# Patient Record
Sex: Male | Born: 1945
Health system: Southern US, Community
[De-identification: ages and names within clinical notes are randomized; demographics above are authoritative.]

## PROBLEM LIST (undated history)

## (undated) DIAGNOSIS — I10 Essential (primary) hypertension: Secondary | ICD-10-CM

## (undated) DIAGNOSIS — E039 Hypothyroidism, unspecified: Secondary | ICD-10-CM

## (undated) DIAGNOSIS — G459 Transient cerebral ischemic attack, unspecified: Secondary | ICD-10-CM

## (undated) DIAGNOSIS — S81009A Unspecified open wound, unspecified knee, initial encounter: Secondary | ICD-10-CM

## (undated) DIAGNOSIS — T4145XA Adverse effect of unspecified anesthetic, initial encounter: Secondary | ICD-10-CM

## (undated) DIAGNOSIS — K219 Gastro-esophageal reflux disease without esophagitis: Secondary | ICD-10-CM

## (undated) DIAGNOSIS — E559 Vitamin D deficiency, unspecified: Secondary | ICD-10-CM

## (undated) DIAGNOSIS — M1710 Unilateral primary osteoarthritis, unspecified knee: Secondary | ICD-10-CM

## (undated) DIAGNOSIS — D649 Anemia, unspecified: Secondary | ICD-10-CM

## (undated) DIAGNOSIS — S81809A Unspecified open wound, unspecified lower leg, initial encounter: Secondary | ICD-10-CM

## (undated) DIAGNOSIS — E785 Hyperlipidemia, unspecified: Secondary | ICD-10-CM

## (undated) DIAGNOSIS — E1165 Type 2 diabetes mellitus with hyperglycemia: Secondary | ICD-10-CM

## (undated) DIAGNOSIS — J309 Allergic rhinitis, unspecified: Secondary | ICD-10-CM

## (undated) DIAGNOSIS — Z86010 Personal history of colonic polyps: Secondary | ICD-10-CM

## (undated) DIAGNOSIS — R0902 Hypoxemia: Secondary | ICD-10-CM

## (undated) DIAGNOSIS — E0590 Thyrotoxicosis, unspecified without thyrotoxic crisis or storm: Secondary | ICD-10-CM

## (undated) DIAGNOSIS — M1711 Unilateral primary osteoarthritis, right knee: Secondary | ICD-10-CM

## (undated) DIAGNOSIS — N400 Enlarged prostate without lower urinary tract symptoms: Secondary | ICD-10-CM

## (undated) DIAGNOSIS — R51 Headache: Secondary | ICD-10-CM

## (undated) DIAGNOSIS — U071 COVID-19: Secondary | ICD-10-CM

## (undated) DIAGNOSIS — J984 Other disorders of lung: Secondary | ICD-10-CM

## (undated) DIAGNOSIS — S91009A Unspecified open wound, unspecified ankle, initial encounter: Secondary | ICD-10-CM

## (undated) DIAGNOSIS — T8859XA Other complications of anesthesia, initial encounter: Secondary | ICD-10-CM

## (undated) DIAGNOSIS — R609 Edema, unspecified: Secondary | ICD-10-CM

## (undated) HISTORY — DX: Unspecified open wound, unspecified knee, initial encounter: S81.009A

## (undated) HISTORY — DX: Thyrotoxicosis, unspecified without thyrotoxic crisis or storm: E05.90

## (undated) HISTORY — DX: Hypoxemia: R09.02

## (undated) HISTORY — DX: Hypothyroidism, unspecified: E03.9

## (undated) HISTORY — DX: Edema, unspecified: R60.9

## (undated) HISTORY — DX: Unspecified open wound, unspecified lower leg, initial encounter: S81.809A

## (undated) HISTORY — DX: Type 2 diabetes mellitus with hyperglycemia: E11.65

## (undated) HISTORY — DX: Other disorders of lung: J98.4

## (undated) HISTORY — PX: NECK SURGERY: SHX720

## (undated) HISTORY — DX: Headache: R51

## (undated) HISTORY — DX: Anemia, unspecified: D64.9

## (undated) HISTORY — DX: Hyperlipidemia, unspecified: E78.5

## (undated) HISTORY — DX: Unilateral primary osteoarthritis, right knee: M17.11

## (undated) HISTORY — DX: COVID-19: U07.1

## (undated) HISTORY — DX: Personal history of colonic polyps: Z86.010

## (undated) HISTORY — DX: Unilateral primary osteoarthritis, unspecified knee: M17.10

## (undated) HISTORY — DX: Gastro-esophageal reflux disease without esophagitis: K21.9

## (undated) HISTORY — DX: Unspecified open wound, unspecified ankle, initial encounter: S91.009A

## (undated) HISTORY — DX: Transient cerebral ischemic attack, unspecified: G45.9

## (undated) HISTORY — DX: Benign prostatic hyperplasia without lower urinary tract symptoms: N40.0

## (undated) HISTORY — DX: Essential (primary) hypertension: I10

## (undated) HISTORY — DX: Allergic rhinitis, unspecified: J30.9

## (undated) HISTORY — DX: Vitamin D deficiency, unspecified: E55.9

---

## 1984-11-26 HISTORY — PX: BACK SURGERY: SHX140

## 1985-07-27 HISTORY — PX: HERNIA REPAIR: SHX51

## 1985-11-26 HISTORY — PX: OTHER SURGICAL HISTORY: SHX169

## 1998-06-14 ENCOUNTER — Ambulatory Visit (HOSPITAL_COMMUNITY): Admission: RE | Admit: 1998-06-14 | Discharge: 1998-06-14 | Payer: Self-pay | Admitting: Orthopedic Surgery

## 1998-11-26 HISTORY — PX: NISSEN FUNDOPLICATION: SHX2091

## 1998-11-26 LAB — HM COLONOSCOPY: HM Colonoscopy: NORMAL

## 1999-01-02 ENCOUNTER — Ambulatory Visit (HOSPITAL_COMMUNITY): Admission: RE | Admit: 1999-01-02 | Discharge: 1999-01-02 | Payer: Self-pay | Admitting: Internal Medicine

## 1999-01-02 ENCOUNTER — Encounter: Payer: Self-pay | Admitting: Internal Medicine

## 1999-02-16 ENCOUNTER — Other Ambulatory Visit: Admission: RE | Admit: 1999-02-16 | Discharge: 1999-02-16 | Payer: Self-pay | Admitting: Internal Medicine

## 1999-03-28 ENCOUNTER — Ambulatory Visit (HOSPITAL_COMMUNITY): Admission: RE | Admit: 1999-03-28 | Discharge: 1999-03-28 | Payer: Self-pay | Admitting: Internal Medicine

## 1999-06-16 ENCOUNTER — Inpatient Hospital Stay (HOSPITAL_COMMUNITY): Admission: EM | Admit: 1999-06-16 | Discharge: 1999-06-19 | Payer: Self-pay | Admitting: Surgery

## 2005-02-14 ENCOUNTER — Ambulatory Visit (HOSPITAL_COMMUNITY): Admission: RE | Admit: 2005-02-14 | Discharge: 2005-02-14 | Payer: Self-pay | Admitting: Orthopedic Surgery

## 2006-06-12 ENCOUNTER — Ambulatory Visit: Payer: Self-pay | Admitting: Internal Medicine

## 2006-07-01 ENCOUNTER — Ambulatory Visit: Payer: Self-pay | Admitting: Internal Medicine

## 2006-07-22 ENCOUNTER — Ambulatory Visit: Payer: Self-pay | Admitting: Internal Medicine

## 2006-07-22 LAB — CONVERTED CEMR LAB: PSA: 0.7 ng/mL

## 2006-07-23 ENCOUNTER — Ambulatory Visit: Payer: Self-pay | Admitting: Internal Medicine

## 2007-01-23 ENCOUNTER — Ambulatory Visit: Payer: Self-pay | Admitting: Internal Medicine

## 2007-01-23 LAB — CONVERTED CEMR LAB: TSH: 5.64 microintl units/mL — ABNORMAL HIGH (ref 0.35–5.50)

## 2007-03-13 ENCOUNTER — Ambulatory Visit: Payer: Self-pay | Admitting: Internal Medicine

## 2007-03-25 ENCOUNTER — Ambulatory Visit: Payer: Self-pay | Admitting: Internal Medicine

## 2007-05-08 ENCOUNTER — Ambulatory Visit: Payer: Self-pay | Admitting: Internal Medicine

## 2007-05-19 ENCOUNTER — Ambulatory Visit: Payer: Self-pay | Admitting: Internal Medicine

## 2007-05-19 LAB — CONVERTED CEMR LAB
BUN: 14 mg/dL (ref 6–23)
Creatinine, Ser: 1 mg/dL (ref 0.4–1.5)

## 2007-05-20 ENCOUNTER — Ambulatory Visit: Payer: Self-pay | Admitting: Cardiology

## 2007-06-11 ENCOUNTER — Ambulatory Visit: Payer: Self-pay | Admitting: Internal Medicine

## 2007-07-11 ENCOUNTER — Encounter: Payer: Self-pay | Admitting: Internal Medicine

## 2007-07-11 DIAGNOSIS — I10 Essential (primary) hypertension: Secondary | ICD-10-CM

## 2007-07-11 DIAGNOSIS — E785 Hyperlipidemia, unspecified: Secondary | ICD-10-CM

## 2007-07-11 DIAGNOSIS — R519 Headache, unspecified: Secondary | ICD-10-CM | POA: Insufficient documentation

## 2007-07-11 DIAGNOSIS — R51 Headache: Secondary | ICD-10-CM

## 2007-07-11 DIAGNOSIS — K219 Gastro-esophageal reflux disease without esophagitis: Secondary | ICD-10-CM

## 2007-07-11 HISTORY — DX: Headache: R51

## 2007-07-11 HISTORY — DX: Hyperlipidemia, unspecified: E78.5

## 2007-07-11 HISTORY — DX: Essential (primary) hypertension: I10

## 2007-07-11 HISTORY — DX: Gastro-esophageal reflux disease without esophagitis: K21.9

## 2007-07-14 DIAGNOSIS — N400 Enlarged prostate without lower urinary tract symptoms: Secondary | ICD-10-CM

## 2007-07-14 DIAGNOSIS — E0590 Thyrotoxicosis, unspecified without thyrotoxic crisis or storm: Secondary | ICD-10-CM

## 2007-07-14 DIAGNOSIS — Z860100 Personal history of colon polyps, unspecified: Secondary | ICD-10-CM

## 2007-07-14 HISTORY — DX: Personal history of colon polyps, unspecified: Z86.0100

## 2007-07-14 HISTORY — DX: Benign prostatic hyperplasia without lower urinary tract symptoms: N40.0

## 2007-07-14 HISTORY — DX: Thyrotoxicosis, unspecified without thyrotoxic crisis or storm: E05.90

## 2007-07-14 HISTORY — DX: Personal history of colonic polyps: Z86.010

## 2007-07-23 ENCOUNTER — Ambulatory Visit: Payer: Self-pay | Admitting: Internal Medicine

## 2007-08-06 ENCOUNTER — Ambulatory Visit: Payer: Self-pay | Admitting: Internal Medicine

## 2007-10-30 ENCOUNTER — Ambulatory Visit: Payer: Self-pay | Admitting: Internal Medicine

## 2007-11-06 ENCOUNTER — Encounter: Payer: Self-pay | Admitting: Internal Medicine

## 2007-11-06 DIAGNOSIS — E039 Hypothyroidism, unspecified: Secondary | ICD-10-CM

## 2007-11-06 DIAGNOSIS — J309 Allergic rhinitis, unspecified: Secondary | ICD-10-CM

## 2007-11-06 HISTORY — DX: Hypothyroidism, unspecified: E03.9

## 2007-11-06 HISTORY — DX: Allergic rhinitis, unspecified: J30.9

## 2007-11-21 ENCOUNTER — Inpatient Hospital Stay (HOSPITAL_COMMUNITY): Admission: RE | Admit: 2007-11-21 | Discharge: 2007-11-24 | Payer: Self-pay | Admitting: Orthopedic Surgery

## 2007-11-21 HISTORY — PX: OTHER SURGICAL HISTORY: SHX169

## 2008-04-22 ENCOUNTER — Telehealth: Payer: Self-pay | Admitting: Internal Medicine

## 2008-05-03 ENCOUNTER — Telehealth (INDEPENDENT_AMBULATORY_CARE_PROVIDER_SITE_OTHER): Payer: Self-pay | Admitting: *Deleted

## 2008-05-07 ENCOUNTER — Ambulatory Visit (HOSPITAL_COMMUNITY): Admission: RE | Admit: 2008-05-07 | Discharge: 2008-05-07 | Payer: Self-pay | Admitting: Orthopedic Surgery

## 2008-06-24 ENCOUNTER — Telehealth (INDEPENDENT_AMBULATORY_CARE_PROVIDER_SITE_OTHER): Payer: Self-pay | Admitting: *Deleted

## 2008-06-24 ENCOUNTER — Ambulatory Visit: Payer: Self-pay | Admitting: Internal Medicine

## 2008-06-24 DIAGNOSIS — J984 Other disorders of lung: Secondary | ICD-10-CM

## 2008-06-24 HISTORY — DX: Other disorders of lung: J98.4

## 2008-08-23 ENCOUNTER — Telehealth: Payer: Self-pay | Admitting: Internal Medicine

## 2008-10-20 ENCOUNTER — Telehealth: Payer: Self-pay | Admitting: Internal Medicine

## 2008-10-27 ENCOUNTER — Ambulatory Visit: Payer: Self-pay | Admitting: Internal Medicine

## 2008-10-27 DIAGNOSIS — IMO0002 Reserved for concepts with insufficient information to code with codable children: Secondary | ICD-10-CM

## 2008-10-27 DIAGNOSIS — M1710 Unilateral primary osteoarthritis, unspecified knee: Secondary | ICD-10-CM

## 2008-10-27 HISTORY — DX: Reserved for concepts with insufficient information to code with codable children: IMO0002

## 2008-10-28 ENCOUNTER — Telehealth: Payer: Self-pay | Admitting: Internal Medicine

## 2008-10-29 ENCOUNTER — Telehealth: Payer: Self-pay | Admitting: Internal Medicine

## 2008-11-25 ENCOUNTER — Telehealth: Payer: Self-pay | Admitting: Internal Medicine

## 2008-11-29 ENCOUNTER — Telehealth: Payer: Self-pay | Admitting: Internal Medicine

## 2009-05-31 ENCOUNTER — Ambulatory Visit: Payer: Self-pay | Admitting: Internal Medicine

## 2009-05-31 DIAGNOSIS — S91009A Unspecified open wound, unspecified ankle, initial encounter: Secondary | ICD-10-CM

## 2009-05-31 DIAGNOSIS — S81009A Unspecified open wound, unspecified knee, initial encounter: Secondary | ICD-10-CM

## 2009-05-31 DIAGNOSIS — S81809A Unspecified open wound, unspecified lower leg, initial encounter: Secondary | ICD-10-CM | POA: Insufficient documentation

## 2009-05-31 DIAGNOSIS — R609 Edema, unspecified: Secondary | ICD-10-CM

## 2009-05-31 HISTORY — DX: Unspecified open wound, unspecified knee, initial encounter: S81.009A

## 2009-05-31 HISTORY — DX: Edema, unspecified: R60.9

## 2009-06-01 ENCOUNTER — Ambulatory Visit: Payer: Self-pay | Admitting: Internal Medicine

## 2009-06-02 ENCOUNTER — Telehealth (INDEPENDENT_AMBULATORY_CARE_PROVIDER_SITE_OTHER): Payer: Self-pay | Admitting: *Deleted

## 2009-06-02 LAB — CONVERTED CEMR LAB
AST: 27 units/L (ref 0–37)
Albumin: 4.1 g/dL (ref 3.5–5.2)
Alkaline Phosphatase: 64 units/L (ref 39–117)
Basophils Relative: 0 % (ref 0.0–3.0)
Calcium: 9 mg/dL (ref 8.4–10.5)
GFR calc non Af Amer: 79.64 mL/min (ref >60)
Hemoglobin, Urine: NEGATIVE
Hemoglobin: 15.4 g/dL (ref 13.0–17.0)
Leukocytes, UA: NEGATIVE
Lymphocytes Relative: 19.1 % (ref 12.0–46.0)
Monocytes Relative: 7.1 % (ref 3.0–12.0)
Neutro Abs: 5.3 10*3/uL (ref 1.4–7.7)
Nitrite: NEGATIVE
PSA: 0.52 ng/mL (ref 0.10–4.00)
RBC: 4.84 M/uL (ref 4.22–5.81)
Sodium: 142 meq/L (ref 135–145)
TSH: 2.65 microintl units/mL (ref 0.35–5.50)
Total Protein: 6.8 g/dL (ref 6.0–8.3)
VLDL: 73.4 mg/dL — ABNORMAL HIGH (ref 0.0–40.0)
WBC: 7.5 10*3/uL (ref 4.5–10.5)
pH: 7 (ref 5.0–8.0)

## 2009-06-13 ENCOUNTER — Ambulatory Visit: Payer: Self-pay | Admitting: Internal Medicine

## 2009-06-13 ENCOUNTER — Telehealth: Payer: Self-pay | Admitting: Internal Medicine

## 2009-06-21 ENCOUNTER — Encounter: Payer: Self-pay | Admitting: Internal Medicine

## 2009-06-21 ENCOUNTER — Ambulatory Visit: Payer: Self-pay | Admitting: Internal Medicine

## 2009-06-26 ENCOUNTER — Encounter: Payer: Self-pay | Admitting: Internal Medicine

## 2009-06-28 ENCOUNTER — Ambulatory Visit: Payer: Self-pay | Admitting: Internal Medicine

## 2009-06-28 LAB — CONVERTED CEMR LAB
AST: 25 units/L (ref 0–37)
Albumin: 4 g/dL (ref 3.5–5.2)
Alkaline Phosphatase: 69 units/L (ref 39–117)
Bilirubin, Direct: 0.2 mg/dL (ref 0.0–0.3)
Cholesterol: 190 mg/dL (ref 0–200)
Total Bilirubin: 0.7 mg/dL (ref 0.3–1.2)
Total CHOL/HDL Ratio: 6

## 2009-11-17 ENCOUNTER — Ambulatory Visit: Payer: Self-pay | Admitting: Internal Medicine

## 2010-07-05 ENCOUNTER — Telehealth: Payer: Self-pay | Admitting: Internal Medicine

## 2010-10-16 ENCOUNTER — Telehealth: Payer: Self-pay | Admitting: Internal Medicine

## 2010-11-13 ENCOUNTER — Telehealth: Payer: Self-pay | Admitting: Internal Medicine

## 2010-11-29 ENCOUNTER — Telehealth: Payer: Self-pay | Admitting: Internal Medicine

## 2010-12-17 ENCOUNTER — Encounter: Payer: Self-pay | Admitting: Internal Medicine

## 2010-12-19 ENCOUNTER — Telehealth: Payer: Self-pay | Admitting: Internal Medicine

## 2010-12-26 NOTE — Progress Notes (Signed)
  Phone Note Refill Request Message from:  Fax from Pharmacy on July 05, 2010 10:46 AM  Refills Requested: Medication #1:  AMLODIPINE BESYLATE 5 MG TABS 1 by mouth once daily   Dosage confirmed as above?Dosage Confirmed   Last Refilled: 10/2009   [facility]: [facility]  Medication #2:  PRAVACHOL 20 MG TABS 1 by mouth once daily.   Dosage confirmed as above?Dosage Confirmed   Last Refilled: 10/2009   Notes: [facility] Initial call taken by: Zella Ball Ewing CMA (AAMA),  July 05, 2010 10:46 AM    Prescriptions: PRAVACHOL 20 MG TABS (PRAVASTATIN SODIUM) 1 by mouth once daily  #90 x 0   Entered by:   Scharlene Gloss CMA (AAMA)   Authorized by:   Corwin Levins MD   Signed by:   Scharlene Gloss CMA (AAMA) on 07/05/2010   Method used:   Faxed to ...       [facility] (retail)       [address]       [place], [place]  [address]       Ph: [phone] or [phone]       Fax: [phone]   RxID:   [id number] AMLODIPINE BESYLATE 5 MG TABS (AMLODIPINE BESYLATE) 1 by mouth once daily  #90 x 0   Entered by:   Scharlene Gloss CMA (AAMA)   Authorized by:   Corwin Levins MD   Signed by:   Scharlene Gloss CMA (AAMA) on 07/05/2010   Method used:   Faxed to ...       [facility] (retail)       [address]       [place], [place]  [address]       Ph: [phone] or [phone]       Fax: [phone]   RxID:   [id number]

## 2010-12-26 NOTE — Progress Notes (Signed)
  Phone Note Refill Request Message from:  Fax from Pharmacy on October 16, 2010 10:21 AM  Refills Requested: Medication #1:  AMLODIPINE BESYLATE 5 MG TABS 1 by mouth once daily   Dosage confirmed as above?Dosage Confirmed   Last Refilled: 07/05/2010   Notes: [facility] [place]  Medication #2:  PRAVACHOL 20 MG TABS 1 by mouth once daily.   Dosage confirmed as above?Dosage Confirmed   Last Refilled: 07/05/2010   Notes: [facility] [place] Initial call taken by: Zella Ball Ewing CMA (AAMA),  October 16, 2010 10:21 AM    Prescriptions: PRAVACHOL 20 MG TABS (PRAVASTATIN SODIUM) 1 by mouth once daily  #30 x 0   Entered by:   Scharlene Gloss CMA (AAMA)   Authorized by:   Corwin Levins MD   Signed by:   Scharlene Gloss CMA (AAMA) on 10/16/2010   Method used:   Faxed to ...       [facility] (retail)       [address]       [place], [place]  [address]       Ph: [phone] or [phone]       Fax: [phone]   RxID:   [id number] AMLODIPINE BESYLATE 5 MG TABS (AMLODIPINE BESYLATE) 1 by mouth once daily  #30 x 0   Entered by:   Scharlene Gloss CMA (AAMA)   Authorized by:   Corwin Levins MD   Signed by:   Scharlene Gloss CMA (AAMA) on 10/16/2010   Method used:   Faxed to ...       [facility] (retail)       [address]       [place], [place]  [address]       Ph: [phone] or [phone]       Fax: [phone]   RxID:   [id number]

## 2010-12-27 ENCOUNTER — Encounter: Payer: Self-pay | Admitting: Internal Medicine

## 2010-12-28 NOTE — Progress Notes (Signed)
  Phone Note Refill Request Message from:  Fax from Pharmacy on November 29, 2010 1:39 PM  Refills Requested: Medication #1:  PRAVACHOL 20 MG TABS 1 by mouth once daily.   Dosage confirmed as above?Dosage Confirmed   Last Refilled: 09/2010   Notes: [facility] Initial call taken by: Zella Ball Ewing CMA (AAMA),  November 29, 2010 1:40 PM    Prescriptions: PRAVACHOL 20 MG TABS (PRAVASTATIN SODIUM) 1 by mouth once daily  #30 x 0   Entered by:   Zella Ball Ewing CMA (AAMA)   Authorized by:   Corwin Levins MD   Signed by:   Scharlene Gloss CMA (AAMA) on 11/29/2010   Method used:   Faxed to ...       [facility] (retail)       [address]       [place], [place]  [address]       Ph: [phone] or [phone]       Fax: [phone]   RxID:   [id number]

## 2010-12-28 NOTE — Progress Notes (Signed)
  Phone Note Refill Request Message from:  Fax from Pharmacy on November 29, 2010 1:32 PM  Refills Requested: Medication #1:  LABETALOL HCL 200 MG TABS 1 tablet by mouth twice a day   Dosage confirmed as above?Dosage Confirmed   Last Refilled: 10/2009   Notes: [facility] Initial call taken by: Robin Ewing CMA (AAMA),  November 29, 2010 1:32 PM    Prescriptions: LABETALOL HCL 200 MG TABS (LABETALOL HCL) 1 tablet by mouth twice a day  #60 x 0   Entered by:   Scharlene Gloss CMA (AAMA)   Authorized by:   Corwin Levins MD   Signed by:   Scharlene Gloss CMA (AAMA) on 11/29/2010   Method used:   Faxed to ...       [facility] (retail)       [address]       [place], [place]  [address]       Ph: [phone] or [phone]       Fax: [phone]   RxID:   [id number]

## 2010-12-28 NOTE — Progress Notes (Signed)
  Phone Note Refill Request Message from:  Fax from Pharmacy on November 29, 2010 1:30 PM  Refills Requested: Medication #1:  AMLODIPINE BESYLATE 5 MG TABS 1 by mouth once daily   Dosage confirmed as above?Dosage Confirmed   Last Refilled: 09/2010   Notes: [facility] Initial call taken by: Robin Ewing CMA (AAMA),  November 29, 2010 1:31 PM    Prescriptions: AMLODIPINE BESYLATE 5 MG TABS (AMLODIPINE BESYLATE) 1 by mouth once daily  #30 x 0   Entered by:   Scharlene Gloss CMA (AAMA)   Authorized by:   Corwin Levins MD   Signed by:   Scharlene Gloss CMA (AAMA) on 11/29/2010   Method used:   Faxed to ...       [facility] (retail)       [address]       [place], [place]  [address]       Ph: [phone] or [phone]       Fax: [phone]   RxID:   [id number]

## 2010-12-28 NOTE — Progress Notes (Signed)
  Phone Note Refill Request Message from:  Fax from Pharmacy on December 19, 2010 2:24 PM  Refills Requested: Medication #1:  LEVOTHYROXINE SODIUM 50 MCG TABS Take 1 tablet by mouth once a day   Dosage confirmed as above?Dosage Confirmed   Notes: [facility] Initial call taken by: Zella Ball Ewing CMA (AAMA),  December 19, 2010 2:24 PM    Prescriptions: LEVOTHYROXINE SODIUM 50 MCG TABS (LEVOTHYROXINE SODIUM) Take 1 tablet by mouth once a day  #30 x 0   Entered by:   Scharlene Gloss CMA (AAMA)   Authorized by:   Corwin Levins MD   Signed by:   Scharlene Gloss CMA (AAMA) on 12/19/2010   Method used:   Faxed to ...       [facility] (retail)       [address]       [place], [place]  [address]       Ph: [phone] or [phone]       Fax: [phone]   RxID:   [id number]

## 2010-12-28 NOTE — Progress Notes (Signed)
  Phone Note Refill Request Message from:  Fax from Pharmacy on November 13, 2010 11:14 AM  Refills Requested: Medication #1:  LEVOTHYROXINE SODIUM 50 MCG TABS Take 1 tablet by mouth once a day   Dosage confirmed as above?Dosage Confirmed   Notes: [facility] Initial call taken by: Zella Ball Ewing CMA (AAMA),  November 13, 2010 11:18 AM    Prescriptions: LEVOTHYROXINE SODIUM 50 MCG TABS (LEVOTHYROXINE SODIUM) Take 1 tablet by mouth once a day  #30 x 0   Entered by:   Scharlene Gloss CMA (AAMA)   Authorized by:   Corwin Levins MD   Signed by:   Scharlene Gloss CMA (AAMA) on 11/13/2010   Method used:   Faxed to ...       [facility] (retail)       [address]       [place], [place]  [address]       Ph: [phone] or [phone]       Fax: [phone]   RxID:   [id number]

## 2011-01-08 ENCOUNTER — Telehealth: Payer: Self-pay | Admitting: Internal Medicine

## 2011-01-17 NOTE — Letter (Signed)
Summary: [facility]   Imported By: Sherian Rein 01/08/2011 12:12:26  _____________________________________________________________________  External Attachment:    Type:   Image     Comment:   External Document

## 2011-01-17 NOTE — Progress Notes (Signed)
  Phone Note Refill Request Message from:  Fax from Pharmacy on January 08, 2011 4:45 PM  Refills Requested: Medication #1:  AMLODIPINE BESYLATE 5 MG TABS 1 by mouth once daily   Dosage confirmed as above?Dosage Confirmed   Last Refilled: 11/29/2010   Notes: [facility]  Medication #2:  LABETALOL HCL 200 MG TABS 1 tablet by mouth twice a day   Dosage confirmed as above?Dosage Confirmed   Last Refilled: 12/09/2010   Notes: [facility]  Medication #3:  PRAVACHOL 20 MG TABS 1 by mouth once daily.   Dosage confirmed as above?Dosage Confirmed   Last Refilled: 12/09/2010   Notes: [facility] Initial call taken by: Robin Ewing CMA (AAMA),  January 08, 2011 4:45 PM    Prescriptions: PRAVACHOL 20 MG TABS (PRAVASTATIN SODIUM) 1 by mouth once daily  #30 x 0   Entered by:   Scharlene Gloss CMA (AAMA)   Authorized by:   Corwin Levins MD   Signed by:   Scharlene Gloss CMA (AAMA) on 01/08/2011   Method used:   Faxed to ...       [facility] (retail)       [address]       [place], [place]  [address]       Ph: [phone] or [phone]       Fax: [phone]   RxID:   [id number] LABETALOL HCL 200 MG TABS (LABETALOL HCL) 1 tablet by mouth twice a day  #60 x 0   Entered by:   Scharlene Gloss CMA (AAMA)   Authorized by:   Corwin Levins MD   Signed by:   Scharlene Gloss CMA (AAMA) on 01/08/2011   Method used:   Faxed to ...       [facility] (retail)       [address]       [place], [place]  [address]       Ph: [phone] or [phone]       Fax: [phone]   RxID:   [id number] AMLODIPINE BESYLATE 5 MG TABS (AMLODIPINE BESYLATE) 1 by mouth once daily  #30 x 0   Entered by:   Scharlene Gloss CMA (AAMA)   Authorized by:   Corwin Levins MD   Signed by:   Scharlene Gloss CMA (AAMA) on 01/08/2011   Method used:   Faxed to ...       [facility] (retail)       [address]       [place], [place]  [address]       Ph: [phone] or [phone]       Fax: [phone]   RxID:   [id number]

## 2011-02-05 ENCOUNTER — Telehealth: Payer: Self-pay | Admitting: Internal Medicine

## 2011-02-11 NOTE — Progress Notes (Unsigned)
  Subjective:    Patient ID: [name], male    DOB: [birth year], 65 y.o.   MRN: [id number]  HPI    Review of Systems     Objective:   Physical Exam        Assessment & Plan:

## 2011-02-11 NOTE — Progress Notes (Deleted)
  Subjective:    Patient ID: [name], male    DOB: 1945-12-29, 65 y.o.   MRN: [id number]  HPI    Review of Systems  Constitutional: Negative for diaphoresis, activity change, appetite change and unexpected weight change.  HENT: Negative for hearing loss, ear pain, facial swelling, mouth sores and neck stiffness.   Eyes: Negative for pain, redness and visual disturbance.  Respiratory: Negative for shortness of breath and wheezing.   Cardiovascular: Negative for chest pain and palpitations.  Gastrointestinal: Negative for diarrhea, blood in stool, abdominal distention and rectal pain.  Genitourinary: Negative for hematuria, flank pain and decreased urine volume.  Musculoskeletal: Negative for myalgias and joint swelling.  Skin: Negative for color change and wound.  Neurological: Negative for syncope and numbness.  Hematological: Negative for adenopathy.  Psychiatric/Behavioral: Negative for hallucinations, self-injury, decreased concentration and agitation.      Objective:   Physical Exam  Vitals reviewed. Constitutional: He is oriented to person, place, and time. He appears well-developed and well-nourished. No distress.  HENT:  Head: Normocephalic and atraumatic.  Right Ear: External ear normal.  Left Ear: External ear normal.  Nose: Nose normal.  Mouth/Throat: Oropharynx is clear and moist.  Eyes: Conjunctivae and EOM are normal. Pupils are equal, round, and reactive to light. Right eye exhibits no discharge. Left eye exhibits no discharge.  Neck: Normal range of motion. Neck supple. No JVD present.  Cardiovascular: Normal rate, regular rhythm, normal heart sounds and intact distal pulses.   Pulmonary/Chest: Effort normal and breath sounds normal.  Abdominal: Soft. Bowel sounds are normal. There is no tenderness.  Musculoskeletal: He exhibits no edema and no tenderness.  Lymphadenopathy:    He has no cervical adenopathy.  Neurological: He is alert and oriented to  person, place, and time. He has normal reflexes. No cranial nerve deficit.  Skin: Skin is warm and dry. No rash noted. He is not diaphoretic. No erythema.  Psychiatric: He has a normal mood and affect. His behavior is normal. Thought content normal. Cognition and memory are normal.          Assessment & Plan:

## 2011-02-12 ENCOUNTER — Ambulatory Visit (INDEPENDENT_AMBULATORY_CARE_PROVIDER_SITE_OTHER): Payer: Medicare Other | Admitting: Internal Medicine

## 2011-02-12 ENCOUNTER — Other Ambulatory Visit: Payer: Medicare Other

## 2011-02-12 ENCOUNTER — Encounter: Payer: Self-pay | Admitting: Internal Medicine

## 2011-02-12 ENCOUNTER — Other Ambulatory Visit: Payer: Self-pay | Admitting: Internal Medicine

## 2011-02-12 DIAGNOSIS — Z0000 Encounter for general adult medical examination without abnormal findings: Secondary | ICD-10-CM

## 2011-02-12 DIAGNOSIS — Z125 Encounter for screening for malignant neoplasm of prostate: Secondary | ICD-10-CM

## 2011-02-12 DIAGNOSIS — E785 Hyperlipidemia, unspecified: Secondary | ICD-10-CM

## 2011-02-12 LAB — CBC WITH DIFFERENTIAL/PLATELET
Basophils Relative: 0.5 % (ref 0.0–3.0)
Eosinophils Relative: 2 % (ref 0.0–5.0)
HCT: 42.5 % (ref 39.0–52.0)
Hemoglobin: 15 g/dL (ref 13.0–17.0)
Lymphs Abs: 2.2 10*3/uL (ref 0.7–4.0)
MCV: 90.8 fl (ref 78.0–100.0)
Monocytes Relative: 7.9 % (ref 3.0–12.0)
Neutro Abs: 8.5 10*3/uL — ABNORMAL HIGH (ref 1.4–7.7)
WBC: 12 10*3/uL — ABNORMAL HIGH (ref 4.5–10.5)

## 2011-02-12 LAB — LIPID PANEL
Cholesterol: 237 mg/dL — ABNORMAL HIGH (ref 0–200)
Total CHOL/HDL Ratio: 6

## 2011-02-12 LAB — HEPATIC FUNCTION PANEL
ALT: 23 U/L (ref 0–53)
AST: 22 U/L (ref 0–37)
Albumin: 4.3 g/dL (ref 3.5–5.2)
Total Protein: 7.1 g/dL (ref 6.0–8.3)

## 2011-02-12 LAB — URINALYSIS
Hgb urine dipstick: NEGATIVE
Nitrite: NEGATIVE
Specific Gravity, Urine: >=1.03 (ref 1.000–1.030)
Total Protein, Urine: NEGATIVE
pH: 5.5 (ref 5.0–8.0)

## 2011-02-12 LAB — BASIC METABOLIC PANEL
Chloride: 99 mEq/L (ref 96–112)
GFR: 72.49 mL/min (ref >60.00)
Potassium: 3.8 mEq/L (ref 3.5–5.1)
Sodium: 137 mEq/L (ref 135–145)

## 2011-02-13 LAB — LDL CHOLESTEROL, DIRECT: Direct LDL: 90.2 mg/dL

## 2011-02-13 NOTE — Progress Notes (Signed)
Phone Note Refill Request Message from:  Fax from Pharmacy on February 05, 2011 10:36 AM  Initial call taken by: Zella Ball Ewing CMA [place]),  February 05, 2011 10:36 AM    Prescriptions: PRAVACHOL 20 MG TABS (PRAVASTATIN SODIUM) 1 by mouth once daily  #30 x 0   Entered by:   Scharlene Gloss CMA (AAMA)   Authorized by:   Corwin Levins MD   Signed by:   Scharlene Gloss CMA (AAMA) on 02/05/2011   Method used:   Faxed to ...       [facility] (retail)       [address]       [place], [place]  [address]       Ph: [phone] or [phone]       Fax: [phone]   RxID:   [id number] HYDROCHLOROTHIAZIDE 25 MG TABS (HYDROCHLOROTHIAZIDE) 1po once daily  #30 x 0   Entered by:   Zella Ball Ewing CMA (AAMA)   Authorized by:   Corwin Levins MD   Signed by:   Scharlene Gloss CMA (AAMA) on 02/05/2011   Method used:   Faxed to ...       [facility] (retail)       [address]       [place], [place]  [address]       Ph: [phone] or [phone]       Fax: [phone]   RxID:   [id number] MELOXICAM 15 MG TABS (MELOXICAM) Take 1 tablet by mouth once a day  #30 x 0   Entered by:   Scharlene Gloss CMA (AAMA)   Authorized by:   Corwin Levins MD   Signed by:   Scharlene Gloss CMA (AAMA) on 02/05/2011   Method used:   Faxed to ...       [facility] (retail)       [address]       [place], [place]  [address]       Ph: [phone] or [phone]       Fax: [phone]   RxID:   [id number] LEVOTHYROXINE SODIUM 50 MCG TABS (LEVOTHYROXINE SODIUM) Take 1 tablet by mouth once a day  #30 x 0   Entered by:   Scharlene Gloss CMA (AAMA)   Authorized by:   Corwin Levins MD   Signed by:   Scharlene Gloss CMA (AAMA) on 02/05/2011   Method used:   Faxed to ...       [facility] (retail)       [address]       [place], [place]  [address]  Ph: [phone] or [phone]       Fax: [phone]   RxID:   [id number] LABETALOL HCL 200 MG TABS (LABETALOL HCL) 1 tablet by mouth twice a day  #60 x 0   Entered by:   Scharlene Gloss CMA (AAMA)   Authorized by:   Corwin Levins MD   Signed by:   Scharlene Gloss CMA (AAMA) on 02/05/2011   Method used:   Faxed to ...       [facility] (retail)       [phone]  [place], [place]  [address]       Ph: [phone] or [phone]       Fax: [phone]   RxID:   [id number] AMLODIPINE BESYLATE 5 MG TABS (AMLODIPINE BESYLATE) 1 by mouth once daily  #30 x 0   Entered by:   Scharlene Gloss CMA (AAMA)   Authorized by:   Corwin Levins MD   Signed by:   Scharlene Gloss CMA (AAMA) on 02/05/2011   Method used:   Faxed to ...       [facility] (retail)       [address]       [place], [place]  [address]       Ph: [phone] or [phone]       Fax: [phone]   RxID:   [id number]   Appended Document:  Called patient to inform overdue for OV as has not been seen since 10/2009. Patient scheduled OV with Dr. Jonny Ruiz 02/12/2011 at 4:00pm.

## 2011-02-19 ENCOUNTER — Telehealth: Payer: Self-pay | Admitting: Internal Medicine

## 2011-02-19 NOTE — Telephone Encounter (Signed)
Pt states that he called the # given on the card for Lab results he had done last Monday and got the message "cannot give information; please call office". Pt would like a callback w/lab results.

## 2011-02-19 NOTE — Telephone Encounter (Signed)
Please thank pt for inquiring about his labs, as his results came back on the evening of mar 19 and were "lost in the system" during the conversion to the completely new Epic on Mar 20  I was able to get the results, and all normal except for slight elev BS to 122,  But SEVERE elev of Triglycerides over 700 (very bad)  Plan:  1) Start fenofibrate 160 mg per day #30 with 11 ref             2)  Return for future lab in 4 wks:  Lipids 272.0                                                                   Hgba1c  790.2  To robin to handle

## 2011-02-20 MED ORDER — FENOFIBRATE 160 MG PO TABS: 160.0000 mg | ORAL_TABLET | Freq: Every day | ORAL | Status: DC

## 2011-02-22 NOTE — Assessment & Plan Note (Signed)
Summary: FOLLOW UP/ LOV 2010 /  #NWS   Vital Signs:  Patient profile:   65 year old male Height:      72 inches Weight:      221 pounds BMI:     30.08 O2 Sat:      96 % on Room air Temp:     98 degrees F oral Pulse rate:   65 / minute BP sitting:   140 / 80  (left arm) Cuff size:   large  Vitals Entered By: Zella Ball Ewing CMA (AAMA) (February 12, 2011 4:21 PM)  O2 Flow:  Room air  CC: followup/Re   CC:  followup/Re.  History of Present Illness: here for wellness and f/u;  overall doing ok, Pt denies CP, worsening sob, doe, wheezing, orthopnea, pnd, worsening LE edema, palps, dizziness or syncope  Pt denies new neuro symptoms such as headache, facial or extremity weakness  Pt denies polydipsia, polyuria, Overall good compliance with meds, trying to follow low chol diet, wt stable, little excercise however  .  No fever, wt loss, night sweats, loss of appetite or other constitutional symptoms  Overall good compliance with meds, and good tolerability.  Denies worsening depressive symptoms, suicidal ideation, or panic.   Pt states good ability with ADL's, low fall risk, home safety reviewed and adequate, no significant change in hearing or vision, trying to follow lower chol diet, and occasionally active only with regular excercise.   Preventive Screening-Counseling & Management      Drug Use:  no.    Problems Prior to Update: 1)  Peripheral Edema  (ICD-782.3) 2)  Wound, Leg  (ICD-891.0) 3)  Preventive Health Care  (ICD-V70.0) 4)  Osteoarthritis, Knee, Right  (ICD-715.96) 5)  Pulmonary Nodule  (ICD-518.89) 6)  Preoperative Examination  (ICD-V72.84) 7)  Hypothyroidism  (ICD-244.9) 8)  Allergic Rhinitis  (ICD-477.9) 9)  Hyperthyroidism  (ICD-242.90) 10)  Benign Prostatic Hypertrophy  (ICD-600.00) 11)  Colonic Polyps, Hx of  (ICD-V12.72) 12)  Hypertension  (ICD-401.9) 13)  Hyperlipidemia  (ICD-272.4) 14)  Headache  (ICD-784.0) 15)  Gerd  (ICD-530.81)  Medications Prior to  Update: 1)  Amlodipine Besylate 5 Mg Tabs (Amlodipine Besylate) .Marland Kitchen.. 1 By Mouth Once Daily 2)  Labetalol Hcl 200 Mg Tabs (Labetalol Hcl) .Marland Kitchen.. 1 Tablet By Mouth Twice A Day 3)  Levothyroxine Sodium 50 Mcg Tabs (Levothyroxine Sodium) .... Take 1 Tablet By Mouth Once A Day 4)  Meloxicam 15 Mg Tabs (Meloxicam) .... Take 1 Tablet By Mouth Once A Day 5)  Aspir-Low 81 Mg Tbec (Aspirin) .Marland Kitchen.. 1po Once Daily 6)  Hydrochlorothiazide 25 Mg Tabs (Hydrochlorothiazide) .Marland Kitchen.. 1po Once Daily 7)  Pravachol 20 Mg Tabs (Pravastatin Sodium) .Marland Kitchen.. 1 By Mouth Once Daily  Current Medications (verified): 1)  Amlodipine Besylate 5 Mg Tabs (Amlodipine Besylate) .Marland Kitchen.. 1 By Mouth Once Daily 2)  Labetalol Hcl 200 Mg Tabs (Labetalol Hcl) .Marland Kitchen.. 1 Tablet By Mouth Twice A Day 3)  Levothyroxine Sodium 50 Mcg Tabs (Levothyroxine Sodium) .... Take 1 Tablet By Mouth Once A Day 4)  Meloxicam 15 Mg Tabs (Meloxicam) .... Take 1 Tablet By Mouth Once A Day  As Needed 5)  Aspir-Low 81 Mg Tbec (Aspirin) .Marland Kitchen.. 1po Once Daily 6)  Hydrochlorothiazide 25 Mg Tabs (Hydrochlorothiazide) .Marland Kitchen.. 1po Once Daily 7)  Pravachol 20 Mg Tabs (Pravastatin Sodium) .Marland Kitchen.. 1 By Mouth Once Daily  Allergies (verified): 1)  ! Pcn 2)  ! * Codiene 3)  ! Naprosyn 4)  ! Prednisone 5)  ! Adhesive Tape  Past History:  Past Medical History: Last updated: 06/01/2009 GERD Headache Hyperlipidemia Hypertension Colonic polyps, hx of Benign prostatic hypertrophy Hyperthyroidism right upper lobe nodule     - CT Chest 6/08     - Repeat limited chest ct ordered June 01, 2009 > no change x 2 years, no further f/u needed Allergic rhinitis Hypothyroidism  Past Surgical History: Last updated: 05/31/2009 neck and back surgery nissen fundoplication 2000 Inguinal herniorrhaphy Left knee replacement Dec 08 s/p right knee arthroscopy 2009 - will need replacement soon  Family History: Last updated: 06/24/2008 heart disease neg lung cancer  Social History: Last  updated: 02/12/2011 Quit smoking 1984 Alcohol use-no Married disabled due to knee pain  - 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 Drug use-no  Risk Factors: Smoking Status: quit (10/30/2007)  Social History: Quit smoking 1984 Alcohol use-no Married disabled due to knee pain  - 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         all otherwise negative per pt -  except does have recurrent left sciatica  Physical Exam  General:  alert and overweight-appearing.   Head:  normocephalic and atraumatic.   Eyes:  vision grossly intact, pupils equal, and pupils round.   Ears:  R ear normal and L ear normal.   Nose:  no external deformity and no nasal discharge.   Mouth:  no gingival abnormalities and pharynx pink and moist.   Neck:  supple and no masses.   Lungs:  normal respiratory effort and normal breath sounds.   Heart:  normal rate and regular rhythm.   Abdomen:  soft, non-tender, and normal bowel sounds.   Msk:  no joint tenderness and no joint swelling.   Extremities:  no edema, no erythema  Neurologic:  cranial nerves II-XII intact and strength normal in all extremities.   Skin:  color normal and no rashes.   Psych:  not depressed appearing and slightly anxious.     Impression & Recommendations:  Problem # 1:  Preventive Health Care (ICD-V70.0) Overall doing well, age appropriate education and counseling updated,  referral for preventive services and immunizations addressed, dietary counseling and smoking status adressed , most recent labs reviewed I have personally reviewed and have noted 1.The patient's medical and social history 2.Their use of alcohol, tobacco or illicit drugs 3.Their current medications and supplements 4. Functional ability including ADL's, fall risk, home safety risk, hearing & visual impairment  5.Diet and physical activities 6.Evidence for depression or mood disorders The patients weight, height, BMI  have been recorded in the chart I have made referrals, counseling and provided education to the patient based review of the above  Orders: TLB-BMP (Basic Metabolic Panel-BMET) (80048-METABOL) TLB-CBC Platelet - w/Differential (85025-CBCD) TLB-Hepatic/Liver Function Pnl (80076-HEPATIC) TLB-Lipid Panel (80061-LIPID) TLB-TSH (Thyroid Stimulating Hormone) (84443-TSH) TLB-PSA (Prostate Specific Antigen) (84153-PSA) TLB-Udip ONLY (81003-UDIP)  Problem # 2:  HYPERTENSION (ICD-401.9)  His updated medication list for this problem includes:    Amlodipine Besylate 5 Mg Tabs (Amlodipine besylate) .Marland Kitchen... 1 by mouth once daily    Labetalol Hcl 200  Mg Tabs (Labetalol hcl) .Marland Kitchen... 1 tablet by mouth twice a day    Hydrochlorothiazide 25 Mg Tabs (Hydrochlorothiazide) .Marland Kitchen... 1po once daily  BP today: 140/80 Prior BP: 142/90 (11/17/2009)  Labs Reviewed: K+: 4.1 (05/31/2009) Creat: : 1.0 (05/31/2009)   Chol: 190 (06/28/2009)   HDL: 33.50 (06/28/2009)   TG: 364.0 (06/28/2009) stable overall by hx and exam, ok to continue meds/tx as is   Complete Medication List: 1)  Amlodipine Besylate 5 Mg Tabs (Amlodipine besylate) .Marland Kitchen.. 1 by mouth once daily 2)  Labetalol Hcl 200 Mg Tabs (Labetalol hcl) .Marland Kitchen.. 1 tablet by mouth twice a day 3)  Levothyroxine Sodium 50 Mcg Tabs (Levothyroxine sodium) .... Take 1 tablet by mouth once a day 4)  Meloxicam 15 Mg Tabs (Meloxicam) .... Take 1 tablet by mouth once a  day  as needed 5)  Aspir-low 81 Mg Tbec (Aspirin) .Marland Kitchen.. 1po once daily 6)  Hydrochlorothiazide 25 Mg Tabs (Hydrochlorothiazide) .Marland Kitchen.. 1po once daily 7)  Pravachol 20 Mg Tabs (Pravastatin sodium) .Marland Kitchen.. 1 by mouth once daily  Patient Instructions: 1)  Continue all previous medications as before this visit 2)  Please go to the Lab in the basement for your blood and/or urine tests today 3)  Please call the number on the [facility] for results of your testing 4)  Please schedule a follow-up appointment in 1 year, or sooner if needed Prescriptions: PRAVACHOL 20 MG TABS (PRAVASTATIN SODIUM) 1 by mouth once daily  #90 x 3   Entered and Authorized by:   Corwin Levins MD   Signed by:   Corwin Levins MD on 02/12/2011   Method used:   Print then Give to Patient   RxID:   [id number] HYDROCHLOROTHIAZIDE 25 MG TABS (HYDROCHLOROTHIAZIDE) 1po once daily  #90 x 3   Entered and Authorized by:   Corwin Levins MD   Signed by:   Corwin Levins MD on 02/12/2011   Method used:   Print then Give to Patient   RxID:   [id number] MELOXICAM 15 MG TABS (MELOXICAM) Take 1 tablet by mouth once a day  as needed  #90 x 3   Entered and Authorized by:   Corwin Levins MD   Signed by:   Corwin Levins MD on 02/12/2011   Method used:   Print then Give to Patient   RxID:   [id number] LEVOTHYROXINE SODIUM 50 MCG TABS (LEVOTHYROXINE SODIUM) Take 1 tablet by mouth once a day  #90 x 3   Entered and Authorized by:   Corwin Levins MD   Signed by:   Corwin Levins MD on 02/12/2011   Method used:   Print then Give to Patient   RxID:   [id number] LABETALOL HCL 200 MG TABS (LABETALOL HCL) 1 tablet by mouth twice a day  #180 x 3   Entered and Authorized by:   Corwin Levins MD   Signed by:   Corwin Levins MD on 02/12/2011   Method used:   Print then Give to Patient   RxID:   [id number] AMLODIPINE BESYLATE 5 MG TABS (AMLODIPINE BESYLATE) 1 by mouth once daily  #90 x 3   Entered and Authorized by:   Corwin Levins MD    Signed by:   Corwin Levins MD on 02/12/2011   Method used:   Print then Give to Patient   RxID:   [id number]    Orders Added: 1)  TLB-BMP (Basic Metabolic Panel-BMET) [80048-METABOL] 2)  TLB-CBC Platelet - w/Differential [85025-CBCD] 3)  TLB-Hepatic/Liver Function Pnl [80076-HEPATIC] 4)  TLB-Lipid Panel [80061-LIPID] 5)  TLB-TSH (Thyroid Stimulating Hormone) [84443-TSH] 6)  TLB-PSA (Prostate Specific Antigen) [84153-PSA] 7)  TLB-Udip ONLY [81003-UDIP] 8)  Est. Patient 65& > [16109]

## 2011-03-12 ENCOUNTER — Telehealth: Payer: Self-pay | Admitting: *Deleted

## 2011-03-12 NOTE — Telephone Encounter (Signed)
To robin  To handle

## 2011-03-12 NOTE — Telephone Encounter (Signed)
Pt left vm - his wife lost all his rx's given at last ov and he needs them called into [facility] in [place].

## 2011-03-12 NOTE — Telephone Encounter (Signed)
Please Advise

## 2011-03-13 ENCOUNTER — Other Ambulatory Visit: Payer: Self-pay

## 2011-03-13 MED ORDER — AMLODIPINE BESYLATE 5 MG PO TABS: 5.0000 mg | ORAL_TABLET | Freq: Every day | ORAL | Status: DC

## 2011-03-13 MED ORDER — LEVOTHYROXINE SODIUM 50 MCG PO TABS: 50.0000 ug | ORAL_TABLET | Freq: Every day | ORAL | Status: DC

## 2011-03-13 MED ORDER — LABETALOL HCL 200 MG PO TABS: 200.0000 mg | ORAL_TABLET | Freq: Two times a day (BID) | ORAL | Status: DC

## 2011-03-13 MED ORDER — PRAVASTATIN SODIUM 20 MG PO TABS: 20.0000 mg | ORAL_TABLET | Freq: Every evening | ORAL | Status: DC

## 2011-03-13 MED ORDER — MELOXICAM 15 MG PO TABS: 15.0000 mg | ORAL_TABLET | Freq: Every day | ORAL | Status: DC

## 2011-03-14 ENCOUNTER — Other Ambulatory Visit: Payer: Self-pay

## 2011-03-14 MED ORDER — HYDROCHLOROTHIAZIDE 25 MG PO TABS: 25.0000 mg | ORAL_TABLET | Freq: Every day | ORAL | Status: DC

## 2011-03-19 ENCOUNTER — Other Ambulatory Visit (INDEPENDENT_AMBULATORY_CARE_PROVIDER_SITE_OTHER): Payer: Medicare Other | Admitting: Internal Medicine

## 2011-03-19 ENCOUNTER — Other Ambulatory Visit: Payer: Self-pay | Admitting: Internal Medicine

## 2011-03-19 ENCOUNTER — Other Ambulatory Visit (INDEPENDENT_AMBULATORY_CARE_PROVIDER_SITE_OTHER): Payer: Medicare Other

## 2011-03-19 DIAGNOSIS — R7309 Other abnormal glucose: Secondary | ICD-10-CM

## 2011-03-19 DIAGNOSIS — E7800 Pure hypercholesterolemia, unspecified: Secondary | ICD-10-CM

## 2011-03-19 DIAGNOSIS — E785 Hyperlipidemia, unspecified: Secondary | ICD-10-CM

## 2011-03-19 LAB — LIPID PANEL
Cholesterol: 221 mg/dL — ABNORMAL HIGH (ref 0–200)
Total CHOL/HDL Ratio: 5
VLDL: 66 mg/dL — ABNORMAL HIGH (ref 0.0–40.0)

## 2011-03-19 NOTE — Progress Notes (Signed)
Quick Note:  Voice message left on [employer] system - lab is negative, normal or otherwise stable, pt to continue same tx ______ 

## 2011-03-20 ENCOUNTER — Other Ambulatory Visit: Payer: Medicare Other

## 2011-03-27 ENCOUNTER — Telehealth: Payer: Self-pay

## 2011-03-27 NOTE — Telephone Encounter (Signed)
Pt called stating that Cholesterol medication is causing side effect, "everything taste like cardboard". Please advise.

## 2011-03-27 NOTE — Telephone Encounter (Signed)
I really dont think the medication is causing this, since this is the first time someone has c/o this.  I think OK to cont med as is

## 2011-03-28 NOTE — Telephone Encounter (Signed)
Pt advised and will continue medication 

## 2011-04-10 NOTE — Op Note (Signed)
[name], [name]               ACCOUNT NO.:  [id number]   MEDICAL RECORD NO.:  [id number]          PATIENT TYPE:  AMB   LOCATION:  DAY                          FACILITY:  [facility]   PHYSICIAN:  Ronald A. Gioffre, M.D.DATE OF BIRTH:  [birth year]   DATE OF PROCEDURE:  05/07/2008  DATE OF DISCHARGE:                               OPERATIVE REPORT   SURGEON:  Dr. [name].   ASSISTANT:  Nurse.   PREOPERATIVE DIAGNOSIS:  1. Degenerative arthritis of the right knee.  2. Torn medial meniscus, right knee.   POSTOPERATIVE DIAGNOSES:  1. Degenerative arthritis of the right knee.  2. Torn medial meniscus, right knee.   OPERATION:  1. Diagnostic arthroscopy, right knee.  2. Medial meniscectomy, posterior horn, right knee.  3. Abrasion chondroplasty, patella, right knee.  4. Medial femoral condyle abrasion chondroplasty of patella and right      knee.   PROCEDURE:  Under general anesthesia, routine orthopedic prep and drape  of the right lower extremity was carried out.  The patient had 1 gram of  IV Ancef.  At this time a small punctate incision was made in the  suprapatellar pouch.  The inflow cannula was inserted and the knee was  distended with saline.  Another small punctate incision was made in the  anterolateral joint.  The arthroscope was entered from a lateral  approach and a complete diagnostic arthroscopy was carried out.  Following that, I introduced a shaver suction device and did a medial  femoral abrasion chondroplasty.  He had rather significant changes  there.  I then went in and did a partial medial meniscectomy.  He had a  tear of the posterior horn of the medial meniscus.  I then probed the  remaining meniscus, and the remaining meniscus was intact.  I went up  into the suprapatellar pouch.  He had obvious chondromalacia of the  patella.  I introduced a shaver suction device and did an abrasion  chondroplasty there.  I thoroughly irrigated out the knee and went down  to the lateral joint.  In the lateral joint space there were no meniscal  tears.  There were just early arthritic changes and nothing needed to be  done in that compartment.  The ACL and PCL were intact.  I thoroughly  irrigated out the knee, removed the fluid and closed all 3 punctate  incisions with 3-0 nylon suture.  I injected 30 mL of 0.5% Marcaine with  epinephrine into the knee joint and a sterile Neosporin bundle dressing  was applied.  The patient left the operating room in satisfactory  condition.   1. Follow-up care will be on crutches or a walker full to partial      weightbearing as tolerated.  2. Will be on aspirin 325 mg 2 today and then 2 each day for 2 weeks.  3. Will be on Dilaudid 2 mg q.4 h p.r.n. for pain.  4. He will see me in the office in about 12-14 days for suture removal      or prior to that if there is a problem.  ______________________________  Georges Lynch [name], M.D.     RAG/MEDQ  D:  05/07/2008  T:  05/07/2008  Job:  [id number]   cc:   Windy Fast A. [name], M.D.  Fax: [phone]

## 2011-04-10 NOTE — Assessment & Plan Note (Signed)
[facility]                             PULMONARY OFFICE NOTE   NAME:[name], [name]                      MRN:          [id number]  DATE:08/06/2007                            DOB:          [birth year]    HISTORY:  A very nice 65 year old white male, remote smoker, with a  Nissan fundoplication in July of 2000, and seen here for refractory  cough in July in the setting of a tiny right apical lung nodule.  I  thought the cough was related to angiotensin-converting enzyme  inhibitors and questioned whether his Nissan fundoplication was leaking,  a not uncommon scenario in the reflux population, especially after  active coughing is occurring.  I tried him off the angiotensin-  converting enzyme inhibitors and on Protonix which he ran out of  yesterday, and is feeling at least 80% better.  He occasionally has a  little bit of a dry cough during the day but mostly with laughing or  talking.  He denies any nocturnal cough or wheeze or shortness of  breath, pleuritic pain, fevers, chills, sweats, orthopnea, PND, lab  work, unintended weight loss, etc.   PHYSICAL EXAMINATION:  He is a very pleasant, ambulatory white male in  no acute distress.  He is afebrile with normal vital signs, blood pressure 148/80 after  taking his labetalol in the morning.  HEENT:  Significant for very minimum voice fatigue.  Oropharynx,  however, is clear.  Lung fields are perfectly clear bilaterally to auscultation and  percussion.  HEART:  Regular rhythm without murmurs, gallops, or rubs.  ABDOMEN:  Soft, benign.  EXTREMITIES:  Warm without calf tenderness, cyanosis, clubbing, or  edema.   IMPRESSION:  1. Upper airway coughing is markedly better on angiotensin-converting      enzyme inhibitors.  My experience has been that patients who cannot      tolerate angiotensin-converting enzyme inhibitor frequently do      either have rhinitis or reflux or both.  Therefore, will see      whether he relapses off of Protonix and off of angiotensin-      converting enzyme inhibitors.  If so, it would make sense to refer      him back to [facility] for repeat evaluation of potential reflux,      rather than simply continue him indefinitely on high-dose proton      pump inhibitor and/or promotility agents, which would be the other      choice.  2. Right apical lung nodule.  The patient informs me that he is not      willing to undergo expensive screening tests like CT scans because      his insurance is only paying a very small amount, and he has a very      high deductible.  I did discuss with him the plain chest x-rays are      not necessarily sensitive enough to pick up lesions at their      earliest stage, but I cannot argue with the point that if it does  not show up on the plain film, it probably is not going to hurt      him, which is a counter argument to doing so many studies that he      can't afford.  I think it is reasonable, therefore, if he will      return in 3 months for a followup chest x-ray to compare apples to      apples with previous x-rays we have here, and if there is an      evolving macroscopic change in the right upper lobe, then proceed      directly to an excisional biopsy rather than do additional scans.   ADDENDUM:  Chest x-ray done this week in the office not available at present on  computer and needs to be reviewed.     Charlaine Dalton. Sherene Sires, MD, [facility]  Electronically Signed    MBW/MedQ  DD: 08/06/2007  DT: 08/07/2007  Job #: [id number]

## 2011-04-10 NOTE — Assessment & Plan Note (Signed)
[facility]                             PULMONARY OFFICE NOTE   NAME:[name], [name]                      MRN:          [id number]  DATE:06/11/2007                            DOB:          [birth year]    REASON FOR CONSULTATION:  Cough.   HISTORY:  This is a 65 year old white male, remote smoker, with a cough  on and off for more than 20 years.  He has also had intermittent nasal  drainage and congestion but denies any real connection between the  cough and the nasal complaints.  The cough comes and goes without any  pattern year round, mostly dry in nature, seems a little bit worse  immediately on lying down but really wakes him up at night.  It has been  much worse since April 2008, the only thing that makes it better is  cough drops but only while he has one in his mouth, then it is worse  after he uses cough drops.  He does have dyspnea with exertion that  seems to correlate with the cough but again no associated subjective  wheeze or obvious reflux symptoms, orthopnea, PND, leg swelling, fevers,  chills, sweats, hunger and weight loss.   PAST MEDICAL HISTORY:  1. Hypertension for which he has been on ACE inhibitors and beta      blockers.  2. Hyperlipidemia.  3. Chronic headaches.  4. Remote neck and back surgery.  5. Significant for GERD for which he underwent Nissen fundoplication      in July of 2000.  6. 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 avium      intracellulare on culture by note documented in 1984.   ALLERGIES:  PENICILLIN AND CODEINE.   MEDICATIONS:  1. Norvasc.  2. Benazepril.  3. Labetalol.  4. Levothyroxine.   SOCIAL HISTORY:  He quit smoking in 1984, he did note he did not have  any significant cough or dyspnea then.  He denies any unusual travel,  pet, or hobby exposure.   FAMILY HISTORY:  Recorded in detail, significant  for asthma in his  mother and emphysema in his father.   REVIEW OF SYSTEMS:  Also recorded in detail, negative except as outlined  above.   PHYSICAL EXAMINATION:  This is a pleasant ambulatory white male in no  acute distress.  He is afebrile, normal vital signs.  HEENT:  Unremarkable, oropharynx clear.  Dentition intact.  NECK:  Supple without cervical adenopathy, tenderness, trachea is  midline, no thyromegaly.  LUNGS:  Fields reveal diminished breath sounds bilaterally.  Regular rhythm without murmur, gallop, rub.  ABDOMEN:  Soft, benign.  EXTREMITIES:  Warm without calf tenderness, cyanosis, clubbing, edema.   CT scan on the chest was performed on June 24 and compared to previous  study dated 2008 and indicates an area of either a pleural parenchymal  area of scarring versus nodule in the right apex associated with  thoracic kyphosis, severe emphysema, and hiatal hernia.  IMPRESSION:  Intermittent cough that dates back over 20 years but has  been much worse since April.  Probably represents the effects of reflux  fanning the fire of an upper airway inflammation that is present from  recurrent reflux (as it has been 8 years since his Nissen Fundoplication  and most patients with severe reflux begin having the problems within 4-  5 years of Nissen).  He also has rhinitis that seems better with the  older antihistamines than the new antihistamines consistent with  postnasal syndrome which also may be fueled or fanned by either reflux  and/or angiotensin converting enzyme inhibitor exposure.   RECOMMENDATIONS:  Are therefore to switch from ACE inhibitors to Benicar  40 mg daily, stop the Benazepril and start Zegerid 4 mg at bedtime but  only for the next 15 days.  If this cough resolves and then relapses off  Zegerid then reflux would be a major consideration.   I spent a good time going over a reflux diet with the patient and his  daughter as well emphasizing the avoidance of  mint and menthol (not that  he uses lots of cough drops that have menthol but make reflux worse and  I have advised him to use sugarless candy for this purpose instead).  The other issues is the vague nodule present in the right apex.  He has  had abnormalities on chest x-ray in the right apex when he was evaluated  by Dr. Jethro Bolus and Dr. Simona Huh.  This turned out to be  granulomatous in nature and probably is the cause of the persistent  densities, however a followup chest x-ray was scheduled for 6 weeks from  now at which time I would like an opportunity to look at any old x-rays  we have in our file room along with the new set.  If there is no gross  change than I would not recommend pursuing this with any additional  biopsies.     Charlaine Dalton. Sherene Sires, MD, [facility]  Electronically Signed    MBW/MedQ  DD: 06/11/2007  DT: 06/12/2007  Job #: [id number]   cc:   Corwin Levins, MD

## 2011-04-10 NOTE — Assessment & Plan Note (Signed)
[facility]                             PULMONARY OFFICE NOTE   NAME:[name] [name] [name] [name]                      MRN:          [id number]  DATE:07/23/2007                            DOB:          01/09/1946    PULMONARY EXTENDED FOLLOWUP OFFICE VISIT   HISTORY:  A 65 year old white male with a history GERD complicated by  esophageal stricture status post Nissen fundoplication in 2000 whom I  saw for chronic cough dating back to April 2007 while on ACE inhibitors.  Was not sure whether the ACE inhibitors by themselves were the problem  or he had developed recurrent reflux.  I, therefore, elected to treat  him with short-term PPI and switched him from ACE inhibitors to Benicar.  He states he is at least 70% improved, but for some reason, the only  medicines he took were the changes made, not his maintenance medicines.  Therefore, when he ran out of his 6 weeks' worth of samples he stopped  everything (again, for reasons that are not clear), and then came in  today for followup.  He still has a little bit of a daytime cough that  is dry in nature.  It is not keeping him up at night.  No longer having  any significant gagging or choking sensation.  His sensation is that he  has too much throat mucus, but actually does not produce excessive  mucus.  He denies any dyspnea, pleuritic pain, fever, chills, sweats,  orthopnea, PND, or leg swelling.  He is on no medications at present,  including his thyroid pill.   PHYSICAL EXAMINATION:  He is an ambulatory, moderately obese white male  in no acute distress.  Blood pressure 168/92.  HEENT:  Unremarkable.  Oropharynx clear.  There is no evidence of  excessive post-nasal drainage or cobble-stoning.  NECK:  Supple without cervical adenopathy or tenderness.  Trachea is  midline.  No thyromegaly.  LUNGS:  The lung fields are perfectly clear to auscultation bilaterally.  CARDIAC:  Regular rate and rhythm without murmur,  gallop, or rub.  ABDOMEN:  Soft and benign.  EXTREMITIES:  Warm without calf tenderness, cyanosis, clubbing, or  edema.   Chest x-ray was repeated today and was normal.  There was concern about  an abnormality in the right apex that was felt to be either scarring or  a tumor from May 20, 2007.  I see no abnormality at all in the right  upper lobe today.   IMPRESSION:  This patient clearly had ACE contributing to the cough, but  I am not sure it was the single cause.  For now, I am going to avoid  both ACE and ARB, and try adding back his maintenance medicine, since he  already has plenty of it in the form of labetalol 200 mg b.i.d. and also  asked him to resume his thyroxine.   It may well be that he still has gastroesophageal reflux disease despite  the fact that he has undergone Nissen fundoplication, and if the cough  persists, the next step would be to add back a  PPI on a maintenance  basis for up to 6 weeks, and/or obtain a sinus CT scan (the evidence  against sinusitis is the fact that he never actually produces mucus, and  it is more of a daytime than a night time event).   Finally, we have not resolved completely the issue of the right upper  lobe lesion that now can only be seen on CT scan and not on plain film.  This patient is a former smoker, but the lesion seen on chest CT looked  benign to me, and will need a followup limited CT scan through the same  area at the end of September.   I gave him a 6-day course of prednisone for any inflammatory component  that may be associated with cyclical coughing, and went over these  issues in detail with him in writing, including the recommend to use  Delsym p.r.n., but listing his changes on the instruction sheet.  I  listed all the medicines so that there is no confusion about which ones  to start, stop, or continue.     Charlaine Dalton. Sherene Sires, MD, [facility]  Electronically Signed    MBW/MedQ  DD: 07/24/2007  DT: 07/24/2007  Job #:  [id number]   cc:   Corwin Levins, MD

## 2011-04-10 NOTE — Op Note (Signed)
[name], [name]               ACCOUNT NO.:  [id number]   MEDICAL RECORD NO.:  [id number]          PATIENT TYPE:  INP   LOCATION:  NA                           FACILITY:  [facility]   PHYSICIAN:  Ronald A. Gioffre, M.D.DATE OF BIRTH:  August 10, 1946   DATE OF PROCEDURE:  11/21/2007  DATE OF DISCHARGE:                               OPERATIVE REPORT   SURGEON:  Georges Lynch. [name], M.D.   ASSISTANT:  Jamelle Rushing, P.A.-C.   PREOPERATIVE DIAGNOSIS:  Traumatic arthritis involving the left knee.   POSTOPERATIVE DIAGNOSIS:  Traumatic arthritis involving the left knee.   OPERATION:  Left total knee arthroplasty utilizing the DePuy system, all  three components were cemented. The sizes used were as follows:  We  utilized a size 3 left femur, a size 41 mm patella, a size 4 tibial  tray, 12.5 mm tibial thickness insert.   PROCEDURE:  Under general anesthesia, routine orthopedic prep and drape  of the left lower extremity was carried out.  He had 2 grams of IV Ancef  preop.  At this time, the leg was exsanguinated using an Esmarch and the  tourniquet was elevated to 350 mmHg.  The knee was flexed.  An incision  was made over the anterior aspect of the left knee.  Bleeders were  identified and cauterized.  The knee then was extended, two flaps were  created.  I then carried out a median parapatellar incision reflecting  the patella laterally and then flexed the knee and did medial and  lateral meniscectomies.  He had rather significant traumatic arthritis  of the knee.  Following that, I excised the anterior and posterior  cruciate ligaments.  I then flexed the knee and inserted the drill and  made my initial drill hole in the intercondylar notch.  I then inserted  the canal finder up into the femoral canal.  We irrigated the femoral  canal out in the usual fashion.  Following that, a #1 jig was inserted  and we removed 11 mm thickness off the distal femur at this time.  The  #2 jig was then  inserted and we measured the femur to be a size 3.  We  looked at the possibility of a size 4 but thought the size 4 would be  too large.  So, at this particular time, we then went down and prepared  the insert for a #3 jig, prepared the femur by doing the anterior,  posterior, and chamfer cuts in the usual fashion for a size 3 femoral  component.  Following that, I went down and made the drill hole in the  tibial plateau after we selected the appropriate size tibial tray to be  a size 4.  Following that, we then cut our keel cut out of the tibial  shaft.  Prior to doing this, we did irrigate the shaft out to make sure  we had all the loose bone marrow from the shaft.  Following that, the  tibia was cut to the appropriate level utilizing the appropriate guide.  We made that cut first obviously, then the  keel cut.  Following that, we  then cut our notch, cut out the distal femur in the usual fashion.  We  then checked posteriorly and removed some spurs off the posterior aspect  of both femoral condyles.  We then inserted our spacers in flexion and  extension. We had good position and good stability.  We put the trial  components back in and then cut our patella in the usual fashion for  resurfacing the patella.  We also did a synovectomy.  Three drill holes  were made in the patella for a size 41 mm patella.  At this time, we  then removed all trial components, water picked out the knee, dried the  knee out.  We inserted some Gelfoam into the femoral canal and down in  the tibial shaft to prevent any migration of the cement.  Vancomycin was  used in the cement.  We inserted cement, cemented all three components  in simultaneously.  We then removed all loose pieces of cement.  We  thoroughly looked for cement after the cement was hardened.  We removed  other additional pieces.  We then water picked out the knee, as well, to  make sure there were no other loose pieces of cement.  Following  that,  we injected 30 mL the 0.25% Marcaine, epinephrine and Toradol, into the  knee soft tissue structures.  We bone waxed the raw bone ends of the  femur.  After the cement was hardened, we checked, once again, to make  sure we had good stability.  We first tried a 10 mm thickness tibial  insert and finally selected a 12.5 thickness tibial insert because it  was more stable.  We thoroughly irrigated out the area.  We, once again,  checked for loose pieces of cement.  We then inserted our Hemovac drain  and closed the knee in layers in the usual fashion.  The skin was closed  with metal staples.  A sterile Neosporin dressing was applied.           ______________________________  Georges Lynch [name], M.D.     RAG/MEDQ  D:  11/21/2007  T:  11/21/2007  Job:  [id number]

## 2011-04-10 NOTE — H&P (Signed)
NAMESULLY, DYMENT               ACCOUNT NO.:  [id number]   MEDICAL RECORD NO.:  [id number]           PATIENT TYPE:   LOCATION:                                 FACILITY:   PHYSICIAN:  Georges Lynch. Gioffre, M.D.DATE OF BIRTH:  October 14, 1946   DATE OF ADMISSION:  11/21/2007  DATE OF DISCHARGE:                              HISTORY & PHYSICAL   CHIEF COMPLAINT:  Left knee pain.   PRESENT ILLNESS:  The patient is a 65 year old gentleman who incurred a  workman's comp injury to his left knee.  He has pain with range of  motion.  It is chronic. He is falling because his knees is giving on  him.  He has had x-rays and arthroscopy which showed he did have some  arthritis deep within the knee.  He has failed conservative treatment.  The patient has elected to proceed with a left total knee arthroplasty.  Workman's comp has approved it, and the patient has elected proceed.   PRIMARY CARE PHYSICIAN:  Corwin Levins, MD   PULMONOLOGIST:  Charlaine Dalton. Sherene Sires, MD, FCCP   ALLERGIES:  1. CODEINE.  2. SURGICAL TAPE, paper tape is okay.   CURRENT MEDICATIONS:  1. Labetalol 200 mg 1 tablet twice a day.  2. Levothyroxine 50 mg 1 tablet a day.   PRESENT MEDICAL HISTORY:  1. Hypertension.  2. History of reflux disease.  3. History of hiatal hernia.  4. Thyroid disease.  5. Traumatic arthritis, left knee.   REVIEW OF SYSTEMS:  Is positive for difficulty with vision.  He wears  glasses.  He does have dentures.  CARDIOVASCULAR:  He denies any  cardiovascular issues.  He has not had any previous cardiac complaints  or workup.  PULMONARY:  He was having some chronic cough.  He saw Dr.  Sherene Sires, and Dr. Sherene Sires took him off of some cardiac medications, and his  coughing has resolved.  He denies any shortness of breath.  GASTROINTESTINAL:  He has  reflux disease which is well controlled with  Rolaids.  He has occasional problems with hemorrhoids.  ENDOCRINE:  He  just has low thyroid. Denies any diabetic  problems.  HEMATOLOGIC:  Unremarkable.   PAST SURGICAL HISTORY:  1. Lumbar disk in 1996. X2.  2. Hiatal hernia repair for reflux.  3. He had a stomach graft done previously.  4. Arthroscopy of his left knee in 2008.  5. Inguinal hernia repair in the past without any anesthetic      complications, but he does have urinary retention postop.   FAMILY MEDICAL HISTORY:  Father is deceased from heart disease and  emphysema. Mother is deceased from heart disease.   SOCIAL HISTORY:  The patient is married, lives with his wife.  He smoked  three packs a day up until 1984.  He denies any alcohol use.  He lives  with his family in a [place].   PHYSICAL EXAMINATION:  VITAL SIGNS:  The patient is 6 feet 1 inch, 210  pounds. Blood pressure was 148/98, pulse of 80 and regular, respirations  12.  The patient is  afebrile.  GENERAL:  This is a healthy-appearing gentleman, conscious, alert,  appropriate.  Ambulates with a fairly easy balanced gait.  HEENT: Head was normocephalic.  Pupils equal, round and reactive.  Gross  hearing is intact.  NECK:  Supple.  No palpable lymphadenopathy.  Good range of motion.  CHEST:  Lung sounds were clear and equal bilaterally.  No wheezes,  rales, rhonchi.  HEART:  Regular rate and rhythm.  ABDOMEN:  Soft, nontender.  Bowel sounds present.  He had well-healed  abdominal incisions.  EXTREMITIES:  Upper extremities had excellent range of motion of  shoulders, elbows and wrists.  Motor strength 5/5.  LOWER EXTREMITIES:  Right and left hip had full extension, flexion up to  120 degrees with 20-30 degrees internal-external rotation without any  discomfort. Bilateral knees were without any signs of erythema or  ecchymosis.  He had no tenderness along the joint line.  He had soft and  nontender calves.  Ankles were symmetrical with good dorsiflexion and  plantar flexion.  PERIPHERAL VASCULAR:  Carotid pulses were 2+, no bruits.  Radial pulses  2+. Posterior  tibial pulses were 2+.  BREAST, RECTAL AND GU:  Exams were deferred at this time.   IMPRESSION:  1. Traumatic osteoarthritis, left knee.  2. Hypertension.  3. Reflux disease.  4. Thyroid disease.   PLAN:  The patient will undergo all routine labs and tests prior to  having a left total knee arthroplasty by Dr. [name] at [facility] on November 21, 2007.  The patient will be seeing Dr. Oliver Barre on December 4 for final medical evaluation.      Jamelle Rushing, P.A.    ______________________________  Georges Lynch [name], M.D.    RWK/MEDQ  D:  10/28/2007  T:  10/29/2007  Job:  [id number]

## 2011-04-13 NOTE — Discharge Summary (Signed)
[name], [name]               ACCOUNT NO.:  [id number]   MEDICAL RECORD NO.:  [id number]          PATIENT TYPE:  INP   LOCATION:  1617                         FACILITY:  [facility]   PHYSICIAN:  Georges Lynch. Gioffre, M.D.DATE OF BIRTH:  1946/03/24   DATE OF ADMISSION:  11/21/2007  DATE OF DISCHARGE:  11/24/2007                               DISCHARGE SUMMARY   ADMISSION DIAGNOSES:  1. Traumatic osteoarthritis left knee.  2. Hypertension.  3. Reflux disease.  4. Thyroid disease.   DISCHARGE DIAGNOSIS:  1. Left total knee arthroplasty.  2. Postoperative blood loss anemia allowed to self correct with p.o.      supplements.  3. Hypertension.  4. Reflux disease.  5. Thyroid disease.   HISTORY OF PRESENT ILLNESS:  The patient is a 65 year old gentleman who  had a Workman's Comp injury to his left knee.  He has painful range of  motion. He had no improved with arthroscopy, but arthroscopy did show he  did have some arthritis deep within the knee.  Failed conservative  treatment.  The patient elected to proceed with a total knee  arthroplasty, and Workman's Comp has approved..   ALLERGIES:  CODEINE, SURGICAL TAPE.  Paper tape is okay.   CURRENT MEDICATIONS:  1. Labetalol 200 mg 1 tablet twice a day.  2. Levothyroxine 50 mg once a day.   SURGICAL PROCEDURES:  On November 21, 2007, the patient was taken to the  OR by Dr. Worthy Rancher, assisted by Oneida Alar, PA-C.  Under general  anesthesia, the patient underwent a left total knee arthroplasty without  any complications.  As the patient had minimal blood loss, the patient  was transferred to the recovery room and then to the orthopedic floor in  good condition.  The patient had the following components implanted, a  size three left femoral component, size four keel tibial tray, a size 2  bearing, 12.5 mm thickness, size 41 mm three-peg patella. All bones were  implanted with polymethyl methacrylate with vancomycin mixed in.   CONSULTANTS:  The routine consults of physical therapy, case management,  pharmacy were utilized.   HOSPITAL COURSE:  On November 21, 2007, the patient was admitted to  [facility] under the care of Dr. Worthy Rancher. The patient was  taken to the OR, had a left total knee arthroplasty by Dr. [name]. The  patient was transferred to the recovery room and then to the orthopedic  floor with IV pain medicines, antibiotics, followed with total knee  protocol, on DVT prophylaxis.  The patient was able to wean off his IV  pain medicines and other medications well without any issues.  His leg  remained neuromotor vascular intact.  His vital signs remained stable.  The patient was able to work well with physical therapy.  He did develop  some postoperative blood loss anemia.  On date of discharge, hemoglobin  was 9.6, but he was tolerating it well, so it was allowed to self  correct with p.o. supplements.  The patient was discharged with  outpatient home health physical therapy at [facility] discretion.  LABORATORY DATA:  CBC on admission found WBC 7.4, hemoglobin 15.1,  hematocrit 42.9, platelets 225.  On date of discharge, hemoglobin was  9.6 with 27.18 hematocrit.  INR was 2.0 on date of discharge.  Routine  chemistries on admission were normal.   EKG on admission was normal sinus rhythm.   Chest x-ray on admission found that he had emphysematous changes as  noted previously.  Previously he had a right apical nodule on CT in June  2008.  This was not visualized on this chest x-ray per the radiologist.   DISCHARGE INSTRUCTIONS:   DIET:  No restrictions.   ACTIVITY:  The patient is to increase his activity slowly with the use  of a walker.   WOUND CARE:  The patient is to change dressing daily.   FOLLOW UP:  The patient needs a followup appoint with Dr. [name] in 2  weeks from surgical date.  The patient is to call [phone] for that  followup appointment.    MEDICATIONS:  1. Mepergan fortis 1 tablet every 4-6 hours for pain if needed.  2. Coumadin 5 mg once a day unless changed by pharmacist.  3. Robaxin 500 mg once every 6 hours for muscle spasms if needed.  4. Labetalol 200 mg 1 tablet twice a day.  5. Amlodipine 10 mg once a day.  6. Levothyroxine 50 mcg once a day.  7. Meloxicam on hold.  8. Glucosamine 2 tablets a day.  9. Omega 3 fish oil three times a day.  10.Aleve on hold.  11.Rolaids p.r.n.   The patient's condition upon discharge to home is listed as improved and  good.      [name], P.A.    ______________________________  Georges Lynch [name], M.D.    RWK/MEDQ  D:  12/10/2007  T:  12/10/2007  Job:  [id number]

## 2011-08-23 LAB — CBC
HCT: 41.2
MCHC: 35.4
MCV: 89.3
RBC: 4.62
WBC: 7.4

## 2011-08-23 LAB — COMPREHENSIVE METABOLIC PANEL
AST: 17
BUN: 15
CO2: 29
Calcium: 9.6
Chloride: 105
Creatinine, Ser: 0.95
GFR calc Af Amer: >60
GFR calc non Af Amer: >60
Glucose, Bld: 78
Total Bilirubin: 0.9

## 2011-08-31 LAB — CBC
HCT: 42.9
Hemoglobin: 15.1
RBC: 4.93
RDW: 13.4
WBC: 7.4

## 2011-08-31 LAB — COMPREHENSIVE METABOLIC PANEL
ALT: 23
Alkaline Phosphatase: 57
BUN: 14
CO2: 23
Chloride: 112
Glucose, Bld: 95
Potassium: 3.9
Sodium: 141
Total Bilirubin: 0.5
Total Protein: 6.3

## 2011-08-31 LAB — HEMOGLOBIN AND HEMATOCRIT, BLOOD
HCT: 30.1 — ABNORMAL LOW
HCT: 40.5
Hemoglobin: 14
Hemoglobin: 9.6 — ABNORMAL LOW

## 2011-08-31 LAB — TYPE AND SCREEN
ABO/RH(D): O POS
Antibody Screen: NEGATIVE

## 2011-08-31 LAB — PROTIME-INR
INR: 1
INR: 1.1
INR: 2 — ABNORMAL HIGH
Prothrombin Time: 14.6
Prothrombin Time: 16.9 — ABNORMAL HIGH
Prothrombin Time: 22.9 — ABNORMAL HIGH

## 2011-08-31 LAB — DIFFERENTIAL
Basophils Relative: 0
Eosinophils Absolute: 0.3
Eosinophils Relative: 4
Lymphs Abs: 1.5
Monocytes Absolute: 0.9
Monocytes Relative: 12

## 2011-08-31 LAB — URINALYSIS, ROUTINE W REFLEX MICROSCOPIC
Bilirubin Urine: NEGATIVE
Hgb urine dipstick: NEGATIVE
Specific Gravity, Urine: 1.018
Urobilinogen, UA: 0.2
pH: 5.5

## 2011-12-10 ENCOUNTER — Encounter: Payer: Self-pay | Admitting: Internal Medicine

## 2011-12-10 DIAGNOSIS — Z0001 Encounter for general adult medical examination with abnormal findings: Secondary | ICD-10-CM

## 2011-12-10 DIAGNOSIS — Z0000 Encounter for general adult medical examination without abnormal findings: Secondary | ICD-10-CM

## 2011-12-10 HISTORY — DX: Encounter for general adult medical examination with abnormal findings: Z00.01

## 2011-12-12 ENCOUNTER — Ambulatory Visit (INDEPENDENT_AMBULATORY_CARE_PROVIDER_SITE_OTHER): Payer: Medicare Other | Admitting: Internal Medicine

## 2011-12-12 ENCOUNTER — Encounter: Payer: Self-pay | Admitting: Internal Medicine

## 2011-12-12 VITALS — BP 140/88 | HR 64 | Temp 99.2°F | Ht 72.0 in | Wt 214.2 lb

## 2011-12-12 DIAGNOSIS — Z0000 Encounter for general adult medical examination without abnormal findings: Secondary | ICD-10-CM

## 2011-12-12 DIAGNOSIS — Z01818 Encounter for other preprocedural examination: Secondary | ICD-10-CM

## 2011-12-12 DIAGNOSIS — M1711 Unilateral primary osteoarthritis, right knee: Secondary | ICD-10-CM | POA: Insufficient documentation

## 2011-12-12 DIAGNOSIS — J069 Acute upper respiratory infection, unspecified: Secondary | ICD-10-CM | POA: Insufficient documentation

## 2011-12-12 DIAGNOSIS — I10 Essential (primary) hypertension: Secondary | ICD-10-CM

## 2011-12-12 DIAGNOSIS — E785 Hyperlipidemia, unspecified: Secondary | ICD-10-CM

## 2011-12-12 HISTORY — DX: Unilateral primary osteoarthritis, right knee: M17.11

## 2011-12-12 MED ORDER — FENOFIBRATE 160 MG PO TABS: 160.0000 mg | ORAL_TABLET | Freq: Every day | ORAL | Status: DC

## 2011-12-12 MED ORDER — HYDROCHLOROTHIAZIDE 25 MG PO TABS: 25.0000 mg | ORAL_TABLET | Freq: Every day | ORAL | Status: DC

## 2011-12-12 MED ORDER — SCOPOLAMINE 1 MG/3DAYS TD PT72: 1.0000 | MEDICATED_PATCH | TRANSDERMAL | Status: DC

## 2011-12-12 MED ORDER — LEVOTHYROXINE SODIUM 50 MCG PO TABS: 50.0000 ug | ORAL_TABLET | Freq: Every day | ORAL | Status: DC

## 2011-12-12 MED ORDER — AMLODIPINE BESYLATE 5 MG PO TABS: 5.0000 mg | ORAL_TABLET | Freq: Every day | ORAL | Status: DC

## 2011-12-12 MED ORDER — LABETALOL HCL 200 MG PO TABS: 200.0000 mg | ORAL_TABLET | Freq: Two times a day (BID) | ORAL | Status: DC

## 2011-12-12 MED ORDER — MELOXICAM 15 MG PO TABS: 15.0000 mg | ORAL_TABLET | Freq: Every day | ORAL | Status: DC

## 2011-12-12 MED ORDER — PRAVASTATIN SODIUM 20 MG PO TABS: 20.0000 mg | ORAL_TABLET | Freq: Every evening | ORAL | Status: DC

## 2011-12-12 NOTE — Assessment & Plan Note (Addendum)
ECG reviewed as per emr, no CP or other symptoms at this time, due to mild to mod increaed risk based on co-morbids will ask for stress test preop, if neg for ischemia would o/w be cleared for surgury

## 2011-12-12 NOTE — Patient Instructions (Signed)
You can take Delsym OTC for cough, and/or Mucinex (or it's generic off brand) for congestion, as it appears you have most likely a viral upper respiratory infection today Please follow lower cholesterol diet Continue all other medications as before; all of your refills were sent to the pharmacy Your EKG was ok today You will be contacted regarding the referral for: Stress test (the kind that you do not have to walk) Please return in 6 mo with Lab testing done 3-5 days before

## 2011-12-12 NOTE — Progress Notes (Signed)
Subjective:    Patient ID: [name], male    DOB: [birth year], 66 y.o.   MRN: [id number]  HPI  Here for evaluation perop for planned Jan 09, 2012 right knee TKA per Dr [name];  Is s/p left knee TKA dec 2008 and did well.  Incidentally today - Here with acute onset mild to mod 2-3 days ST, HA, general weakness and malaise, with nonprod cough, but Pt denies chest pain, increased sob or doe, wheezing, orthopnea, PND, increased LE swelling, palpitations, dizziness or syncope.  Pt denies new neurological symptoms such as new headache, or facial or extremity weakness or numbness   Pt denies polydipsia, polyuria, but admits to some dietary noncompliacne with cholesterol in last few months.   Pt denies fever, wt loss, night sweats, loss of appetite, or other constitutional symptoms  Overall good compliance with treatment, and good medicine tolerability.  Denies worsening depressive symptoms, suicidal ideation, or panic.  No other acute complaints at this time Past Medical History  Diagnosis Date  . ALLERGIC RHINITIS 11/06/2007  . BENIGN PROSTATIC HYPERTROPHY 07/14/2007  . COLONIC POLYPS, HX OF 07/14/2007  . GERD 07/11/2007  . Headache 07/11/2007  . HYPERLIPIDEMIA 07/11/2007  . HYPERTENSION 07/11/2007  . HYPERTHYROIDISM 07/14/2007  . HYPOTHYROIDISM 11/06/2007  . OSTEOARTHRITIS, KNEE, RIGHT 10/27/2008  . PERIPHERAL EDEMA 05/31/2009  . PULMONARY NODULE 06/24/2008  . WOUND, LEG 05/31/2009  . Degenerative arthritis of right knee 12/12/2011   Past Surgical History  Procedure Date  . Neck surgery   . Back surgery   . Nissen fundoplication 2000  . Hernia repair   . Left knee replacement 10/2007  . Right knee replacement 2009    reports that he quit smoking about 29 years ago. He does not have any smokeless tobacco history on file. He reports that he does not drink alcohol or use illicit drugs. family history includes Heart disease in his other. Allergies  Allergen Reactions  . Codeine   . Naproxen   .  Penicillins   . Prednisone    No current outpatient prescriptions on file prior to visit.   Review of Systems Review of Systems  Constitutional: Negative for diaphoresis, activity change, appetite change and unexpected weight change.  HENT: Negative for hearing loss, ear pain, facial swelling, mouth sores and neck stiffness.   Eyes: Negative for pain, redness and visual disturbance.  Respiratory: Negative for shortness of breath and wheezing.   Cardiovascular: Negative for chest pain and palpitations.  Gastrointestinal: Negative for diarrhea, blood in stool, abdominal distention and rectal pain.  Genitourinary: Negative for hematuria, flank pain and decreased urine volume.  Musculoskeletal: Negative for myalgias and joint swelling.  Skin: Negative for color change and wound.  Neurological: Negative for syncope and numbness.  Hematological: Negative for adenopathy.  Psychiatric/Behavioral: Negative for hallucinations, self-injury, decreased concentration and agitation.      Objective:   Physical Exam BP 140/88  Pulse 64  Temp(Src) 99.2 F (37.3 C) (Oral)  Ht 6' (1.829 m)  Wt 214 lb 4 oz (97.183 kg)  BMI 29.06 kg/m2  SpO2 95% Physical Exam  VS noted, mild ill appearing Constitutional: Pt is oriented to person, place, and time. Appears well-developed and well-nourished.  HENT:  Head: Normocephalic and atraumatic.  Right Ear: External ear normal.  Left Ear: External ear normal.  Nose: Nose normal.  Mouth/Throat: Oropharynx is clear and moist. Bilat tm's mild erythema.  Sinus nontender.  Pharynx mild erythema  Eyes: Conjunctivae and EOM are normal. Pupils are equal,  round, and reactive to light.  Neck: Normal range of motion. Neck supple. No JVD present. No tracheal deviation present.  Cardiovascular: Normal rate, regular rhythm, normal heart sounds and intact distal pulses.   Pulmonary/Chest: Effort normal and breath sounds normal.  Abdominal: Soft. Bowel sounds are normal.  There is no tenderness.  Musculoskeletal: Normal range of motion. Exhibits no edema.  Lymphadenopathy:  Has no cervical adenopathy.  Neurological: Pt is alert and oriented to person, place, and time. Pt has normal reflexes. No cranial nerve deficit.  Skin: Skin is warm and dry. No rash noted.  Right knee with marked crepitus, antalgic gait, small effusion Psychiatric:  Has  normal mood and affect. Behavior is normal. Not depressed appearing    Assessment & Plan:

## 2011-12-13 ENCOUNTER — Encounter: Payer: Self-pay | Admitting: Internal Medicine

## 2011-12-13 NOTE — Assessment & Plan Note (Signed)
stable overall by hx and exam, most recent data reviewed with pt, and pt to continue medical treatment as before, for lower chol diet

## 2011-12-13 NOTE — Assessment & Plan Note (Signed)
stable overall by hx and exam, most recent data reviewed with pt, and pt to continue medical treatment as before  BP Readings from Last 3 Encounters:  12/12/11 140/88  02/12/11 140/80  11/17/09 142/90

## 2011-12-13 NOTE — Assessment & Plan Note (Addendum)
Mild to mod, for mucinex otc and tylenol - exam c/w mild viral illness most likely,  to f/u any worsening symptoms or concerns

## 2011-12-19 ENCOUNTER — Ambulatory Visit (HOSPITAL_COMMUNITY): Payer: Medicare Other | Attending: Internal Medicine | Admitting: Radiology

## 2011-12-19 VITALS — BP 150/86 | Ht 72.0 in | Wt 206.0 lb

## 2011-12-19 DIAGNOSIS — Z01818 Encounter for other preprocedural examination: Secondary | ICD-10-CM

## 2011-12-19 DIAGNOSIS — J4489 Other specified chronic obstructive pulmonary disease: Secondary | ICD-10-CM | POA: Insufficient documentation

## 2011-12-19 DIAGNOSIS — Z87891 Personal history of nicotine dependence: Secondary | ICD-10-CM | POA: Insufficient documentation

## 2011-12-19 DIAGNOSIS — I4460 Unspecified fascicular block: Secondary | ICD-10-CM | POA: Insufficient documentation

## 2011-12-19 DIAGNOSIS — R0989 Other specified symptoms and signs involving the circulatory and respiratory systems: Secondary | ICD-10-CM | POA: Insufficient documentation

## 2011-12-19 DIAGNOSIS — J449 Chronic obstructive pulmonary disease, unspecified: Secondary | ICD-10-CM | POA: Insufficient documentation

## 2011-12-19 DIAGNOSIS — R0609 Other forms of dyspnea: Secondary | ICD-10-CM | POA: Insufficient documentation

## 2011-12-19 DIAGNOSIS — I10 Essential (primary) hypertension: Secondary | ICD-10-CM | POA: Insufficient documentation

## 2011-12-19 DIAGNOSIS — R0602 Shortness of breath: Secondary | ICD-10-CM

## 2011-12-19 DIAGNOSIS — Z01810 Encounter for preprocedural cardiovascular examination: Secondary | ICD-10-CM | POA: Insufficient documentation

## 2011-12-19 DIAGNOSIS — E785 Hyperlipidemia, unspecified: Secondary | ICD-10-CM

## 2011-12-19 MED ORDER — TECHNETIUM TC 99M TETROFOSMIN IV KIT
11.0000 | PACK | Freq: Once | INTRAVENOUS | Status: AC | PRN
  Administered 2011-12-19: 11 via INTRAVENOUS

## 2011-12-19 MED ORDER — REGADENOSON 0.4 MG/5ML IV SOLN
0.4000 mg | Freq: Once | INTRAVENOUS | Status: AC
  Administered 2011-12-19: 0.4 mg via INTRAVENOUS

## 2011-12-19 MED ORDER — TECHNETIUM TC 99M TETROFOSMIN IV KIT
33.0000 | PACK | Freq: Once | INTRAVENOUS | Status: AC | PRN
  Administered 2011-12-19: 33 via INTRAVENOUS

## 2011-12-19 NOTE — Progress Notes (Signed)
[facility] SITE 3 NUCLEAR MED [address] [place] [address] [phone]  Cardiology Nuclear Med Study  [name] is a 66 y.o. male [id number] 01/02/1946   Nuclear Med Background Indication for Stress Test:  Evaluation for Ischemia and Surgical Clearance- TKR (R) Dr. [name] History:  COPD Cardiac Risk Factors: History of Smoking, Hypertension and Lipids, ILBBB  Symptoms:  DOE   Nuclear Pre-Procedure Caffeine/Decaff Intake:  None NPO After: 11:00pm   Lungs:  Clear IV 0.9% NS with Angio Cath:  20g  IV Site: R Hand  IV Started by:  Bonnita Levan, RN  Chest Size (in):  44 Cup Size: n/a  Height: 6' (1.829 m)  Weight:  206 lb (93.441 kg)  BMI:  Body mass index is 27.94 kg/(m^2). Tech Comments:  N/A    Nuclear Med Study 1 or 2 day study: 1 day  Stress Test Type:  Lexiscan  Reading MD: Marca Ancona, MD  Order Authorizing Provider:  Oliver Barre, MD  Resting Radionuclide: Technetium 76m Tetrofosmin  Resting Radionuclide Dose: 11.0 mCi   Stress Radionuclide:  Technetium 58m Tetrofosmin  Stress Radionuclide Dose: 33.0 mCi           Stress Protocol Rest HR: 55 Stress HR: 65  Rest BP: 150/86 Stress BP: 150/84  Exercise Time (min): n/a METS: n/a   Predicted Max HR: 153 bpm % Max HR: 42.48 bpm Rate Pressure Product: 9750   Dose of Adenosine (mg):  n/a Dose of Lexiscan: 0.4 mg  Dose of Atropine (mg): n/a Dose of Dobutamine: n/a mcg/kg/min (at max HR)  Stress Test Technologist: Bonnita Levan, RN  Nuclear Technologist:  Domenic Polite, CNMT     Rest Procedure:  Myocardial perfusion imaging was performed at rest 45 minutes following the intravenous administration of Technetium 72m Tetrofosmin. Rest ECG: NSR-ILBBB  Stress Procedure:  The patient received IV Lexiscan 0.4 mg over 15-seconds.  Technetium 42m Tetrofosmin injected at 30-seconds.  There were no significant changes with Lexiscan.  Quantitative spect images were obtained after a 45 minute  delay. Stress ECG: No significant change from baseline ECG  QPS Raw Data Images:  Normal; no motion artifact; normal heart/lung ratio. Stress Images:  Mild inferior perfusion defect.  Rest Images:  Mild inferior perfusion defect.  Subtraction (SDS):  Mild fixed inferior perfusion.  Transient Ischemic Dilatation (Normal <1.22):  0.99 Lung/Heart Ratio (Normal <0.45):  0.25  Quantitative Gated Spect Images QGS EDV:  99 ml QGS ESV:  41 ml QGS cine images:  NL LV Function; NL Wall Motion QGS EF: 58%  Impression Exercise Capacity:  Lexiscan with no exercise. BP Response:  Normal blood pressure response. Clinical Symptoms:  No significant symptoms.  ECG Impression:  Baseline IVCD, no changes with infusion.  Comparison with Prior Nuclear Study: No images to compare  Overall Impression:  Normal stress nuclear study.  There is a mild fixed inferior perfusion defect with normal wall motion and EF.  I suspect that this is diaphragmatic artifact.   [name] [employer]

## 2012-01-01 ENCOUNTER — Encounter (HOSPITAL_COMMUNITY): Payer: Self-pay

## 2012-01-01 ENCOUNTER — Ambulatory Visit (HOSPITAL_COMMUNITY)
Admission: RE | Admit: 2012-01-01 | Discharge: 2012-01-01 | Disposition: A | Discharge status: Home / Self Care | Payer: Medicare Other | Source: Ambulatory Visit | Attending: Orthopedic Surgery | Admitting: Orthopedic Surgery

## 2012-01-01 ENCOUNTER — Encounter (HOSPITAL_COMMUNITY)
Admission: RE | Admit: 2012-01-01 | Discharge: 2012-01-01 | Disposition: A | Discharge status: Home / Self Care | Payer: Medicare Other | Source: Ambulatory Visit | Attending: Orthopedic Surgery | Admitting: Orthopedic Surgery

## 2012-01-01 DIAGNOSIS — Z01818 Encounter for other preprocedural examination: Secondary | ICD-10-CM | POA: Insufficient documentation

## 2012-01-01 DIAGNOSIS — Z01812 Encounter for preprocedural laboratory examination: Secondary | ICD-10-CM | POA: Insufficient documentation

## 2012-01-01 DIAGNOSIS — M4000 Postural kyphosis, site unspecified: Secondary | ICD-10-CM | POA: Insufficient documentation

## 2012-01-01 DIAGNOSIS — M47814 Spondylosis without myelopathy or radiculopathy, thoracic region: Secondary | ICD-10-CM | POA: Insufficient documentation

## 2012-01-01 LAB — CBC
HCT: 44.2 % (ref 39.0–52.0)
Hemoglobin: 15.1 g/dL (ref 13.0–17.0)
MCH: 30.9 pg (ref 26.0–34.0)
MCHC: 34.2 g/dL (ref 30.0–36.0)
MCV: 90.4 fL (ref 78.0–100.0)
Platelets: 212 10*3/uL (ref 150–400)
RBC: 4.89 MIL/uL (ref 4.22–5.81)
RDW: 12.5 % (ref 11.5–15.5)
WBC: 8 10*3/uL (ref 4.0–10.5)

## 2012-01-01 LAB — COMPREHENSIVE METABOLIC PANEL
ALT: 21 U/L (ref 0–53)
AST: 23 U/L (ref 0–37)
Albumin: 4.1 g/dL (ref 3.5–5.2)
Alkaline Phosphatase: 72 U/L (ref 39–117)
BUN: 15 mg/dL (ref 6–23)
CO2: 27 mEq/L (ref 19–32)
Calcium: 9.9 mg/dL (ref 8.4–10.5)
Chloride: 99 mEq/L (ref 96–112)
Creatinine, Ser: 1.14 mg/dL (ref 0.50–1.35)
GFR calc Af Amer: 75 mL/min — ABNORMAL LOW (ref >90)
GFR calc non Af Amer: 65 mL/min — ABNORMAL LOW (ref >90)
Glucose, Bld: 154 mg/dL — ABNORMAL HIGH (ref 70–99)
Potassium: 3.6 mEq/L (ref 3.5–5.1)
Sodium: 140 mEq/L (ref 135–145)
Total Bilirubin: 0.3 mg/dL (ref 0.3–1.2)
Total Protein: 7.4 g/dL (ref 6.0–8.3)

## 2012-01-01 LAB — URINALYSIS, ROUTINE W REFLEX MICROSCOPIC
Bilirubin Urine: NEGATIVE
Glucose, UA: NEGATIVE mg/dL
Hgb urine dipstick: NEGATIVE
Ketones, ur: NEGATIVE mg/dL
Leukocytes, UA: NEGATIVE
Nitrite: NEGATIVE
Protein, ur: NEGATIVE mg/dL
Specific Gravity, Urine: 1.015 (ref 1.005–1.030)
Urobilinogen, UA: 0.2 mg/dL (ref 0.0–1.0)
pH: 6 (ref 5.0–8.0)

## 2012-01-01 LAB — SURGICAL PCR SCREEN
MRSA, PCR: NEGATIVE
Staphylococcus aureus: POSITIVE — AB

## 2012-01-01 LAB — DIFFERENTIAL
Basophils Absolute: 0 10*3/uL (ref 0.0–0.1)
Basophils Relative: 0 % (ref 0–1)
Eosinophils Absolute: 0.3 10*3/uL (ref 0.0–0.7)
Eosinophils Relative: 4 % (ref 0–5)
Lymphocytes Relative: 24 % (ref 12–46)
Lymphs Abs: 2 10*3/uL (ref 0.7–4.0)
Monocytes Absolute: 0.5 10*3/uL (ref 0.1–1.0)
Monocytes Relative: 6 % (ref 3–12)
Neutro Abs: 5.2 10*3/uL (ref 1.7–7.7)
Neutrophils Relative %: 65 % (ref 43–77)

## 2012-01-01 LAB — PROTIME-INR
INR: 0.97 (ref 0.00–1.49)
Prothrombin Time: 13.1 seconds (ref 11.6–15.2)

## 2012-01-01 LAB — APTT: aPTT: 39 seconds — ABNORMAL HIGH (ref 24–37)

## 2012-01-01 NOTE — Pre-Procedure Instructions (Signed)
Dr Oliver Barre medical clarance note on chart ekg 12-12-2011 in epic 12-20-2011 stress test in epic

## 2012-01-01 NOTE — Patient Instructions (Addendum)
20 [name]  01/01/2012   Your procedure is scheduled on: 01-09-2012  Report to [facility] at  0630 AM.  Call this number if you have problems the morning of surgery: [phone]   Remember:   Do not eat food or drink liquids:After Midnight.  Take these medicines the morning of surgery with A SIP OF WATER: labetaolol  levothyroxine   Do not wear jewelry...  Do not wear lotions, powders, or perfumes. Do not wear deodorant.  .  Do not bring valuables to the hospital.  Contacts, dentures or bridgework may not be worn into surgery.  Leave suitcase in the car. After surgery it may be brought to your room.  For patients admitted to the hospital, checkout time is 11:00 AM the day of discharge.     Special Instructions: CHG Shower Use Special Wash: 1/2 bottle night before surgery and 1/2 bottle morning of surgery.neck down avoid private area [employer] rn w[facility] pre op nurse phone number [phone]   Please read over the following fact sheets that you were given:   01/01/2012   Your procedure is scheduled on:   Report to [facility] at  AM.  Call this number if you have problems the morning of surgery: [phone]   Remember:   Do not eat food or drink liquids:After Midnight.  Take these medicines the morning of surgery with A SIP OF WATER:    Do not wear jewelry...  Do not wear lotions, powders, or perfumes. Do not wear deodorant.  .  Do not bring valuables to the hospital.  Contacts, dentures or bridgework may not be worn into surgery.  Leave suitcase in the car. After surgery it may be brought to your room.  For patients admitted to the hospital, checkout time is 11:00 AM the day of discharge.     Special Instructions: CHG Shower Use Special Wash: 1/2 bottle night before surgery and 1/2 bottle morning of surgery.neck down avoid private area   Please read over the following fact sheets that you were given: MRSA Information  Jasmine December Steffi Noviello rn  wl pre op nurse phone number [phone] call if needed

## 2012-01-01 NOTE — H&P (Signed)
[name] DOB: June 02, 1946  Chief Complaint: Right knee pain  History of Present Illness The patient is a 66 year old male who comes in today for a preoperative History and Physical. The patient is scheduled for a right total knee arthroplasty to be performed by Dr. Georges [name]. Steven Hillock, MD at [facility] on Wednesday January 09, 2012 . He has had years of right knee pain due to severe degenerative osteoarthritis. Total knee arthroplasty scheduled in order to achieve increased function and decreased pain. PCP: Dr. Oliver [name]  Problem List/Past Medical Osteoarthritis, Knee (715.96) Hypercholesterolemia Hypertension Hyperthyroidism Chronic Obstructive Lung Disease Gastroesophageal Reflux Disease High blood pressure Hypercholesterolemia Dentures Hiatal Hernia Hemorrhoids Hypothyroidism  Allergies Codeine/Codeine Derivatives Penicillins Corticosteroids. prednisone pak Adhesive Tape   Family History Heart Disease Cancer. mother (breast) Chronic Obstructive Lung Disease. father Congestive Heart Failure. mother, father and grandfather mothers side Drug / Alcohol Addiction. father and brother Heart Disease. mother, father and grandfather mothers side Heart disease in male family member before age 21 Heart disease in male family member before age 64 Hypertension. father Kidney disease. father Rheumatoid Arthritis. grandmother mothers side, grandmother fathers side and grandfather fathers side Myocardial Infarction. Father, Maternal Grandfather, Mother.   Social History Tobacco use. former smoker; smoke(d) 2 pack(s) per day No alcohol use Alcohol use. current drinker; only occasionally per week Children. 2 Current work status. retired Financial planner (Currently). no Drug/Alcohol Rehab (Previously). no Exercise. Exercises rarely; does other Illicit drug use. no Living situation. live with spouse Marital status.  married Number of flights of stairs before winded. 2-3 Pain Contract. no Tobacco / smoke exposure. no Caregiver. wife   Medication History Levothyroxine Sodium ( Tablet, Oral) Active. Pravastatin Sodium ( Oral) Specific dose unknown - Active. Meloxicam (15MG  Tablet, Oral) Active. Aspirin (81MG  Tablet, 1 Oral) Active. Labetalol HCl ( Oral) Specific dose unknown - Active. Fenofibrate (160MG  Tablet, Oral) Active. Pravastatin Sodium (20MG  Tablet, Oral) Active. Transderm-Scop (1.5MG  Patch 72HR, Transdermal) Active. Hydrochlorothiazide (25MG  Tablet, Oral) Active. AmLODIPine Besylate (5MG  Tablet, Oral) Active.   Past Surgical History Tonsillectomy. 1950s stomach sx for reflux. 2000 Total Knee Replacement - Left. Oct 2008 Arthroscopy of Knee. bilateral Neck Disc Surgery. C3 Feb 1986 Other Orthopaedic Surgery. Bone spur right calcaneus 1987 Spinal Surgery. L3 Sept 1986 Total Knee Replacement. left Hernia Repair. Sept 1986   Review of Systems General:Not Present- Chills, Fever, Night Sweats, Appetite Loss, Fatigue, Feeling sick, Weight Gain, Weight Loss and Memory Loss. Skin:Present- Skin Color Changes. Not Present- Hives, Itching, Rash, Ulcer, Psoriasis, Change in Hair or Nails, Eczema and Lesions. HEENT:Present- Dentures. Not Present- Sensitivity to light, Hearing problems, Tinnitus, Nose Bleed, Headache, Double Vision, Visual Loss, Hearing Loss and Ringing in the Ears. Neck:Not Present- Swollen Glands and Neck Mass. Respiratory:Not Present- Shortness of breath with exertion, Shortness of breath at rest, Allergies, Coughing up blood, Snoring, Chronic Cough, Bloody sputum and Dyspnea. Cardiovascular:Present- Leg Cramps. Not Present- Shortness of Breath, Chest Pain, Swelling of Extremities, Racing/skipping heartbeats, Difficulty Breathing Lying Down, Murmur, Swelling and Palpitations. Gastrointestinal:Present- Heartburn. Not Present- Bloody Stool, Abdominal  Pain, Vomiting, Nausea, Constipation, Diarrhea, Difficulty Swallowing, Incontinence of Stool, Jaundice and Loss of appetitie. Male Genitourinary:Present- Nocturia. Not Present- Urinary frequency, Blood in Urine, Weak urinary stream, Discharge, Flank Pain, Frequency, Incontinence, Painful Urination, Urgency, Urinary Retention and Urinating at Night. Musculoskeletal:Present- Joint Stiffness, Joint Pain, Back Pain and Morning Stiffness. Not Present- Muscle Weakness, Muscle Pain, Joint Swelling and Spasms. Neurological:Present- Tingling, Headaches and Difficulty with balance. Not Present- Numbness, Burning, Tremor,  Dizziness, Blackout spells, Paralysis and Weakness. Psychiatric:Present- Insomnia. Not Present- Anxiety, Depression and Memory Loss. Endocrine:Not Present- Cold Intolerance, Heat Intolerance, Excessive hunger and Excessive Thirst. Hematology:Not Present- Abnormal Bleeding, Anemia, Blood Clots and Easy Bruising.   Vitals 01/01/2012 10:50 AM Weight: 214 lb Height: 72 in Body Surface Area: 2.22 m Body Mass Index: 29.02 kg/m Pulse: 72 (Regular) Resp.: 18 (Unlabored) BP: 150/90 (Sitting, Left Arm, Standard)    Physical Exam General Mental Status - Alert, cooperative and good historian. General Appearance- pleasant. Not in acute distress. Orientation- Oriented X3. Build & Nutrition- Well nourished and Well developed. Head and Neck Head- normocephalic, atraumatic . NeckGlobal Assessment- supple. no bruit auscultated on the right and no bruit auscultated on the left. Eye Pupil- Bilateral- PERRLA. Motion- Bilateral- EOMI. Chest and Lung Exam Auscultation: Breath sounds:- clear at anterior chest wall and - clear at posterior chest wall. Adventitious sounds:- No Adventitious sounds. Cardiovascular Auscultation:Rhythm- Regular rate and rhythm. Heart Sounds- S1 WNL and S2 WNL. Murmurs & Other Heart Sounds:Auscultation of the heart reveals - No  Murmurs. Abdomen Palpation/Percussion:Tenderness- Abdomen is non-tender to palpation. Rigidity (guarding)- Abdomen is soft. Auscultation:Auscultation of the abdomen reveals - Bowel sounds normal. Male Genitourinary Not done, not pertinent to present illness Peripheral Vascular Upper Extremity: Palpation:Radial pulse- Bilateral- 2+. Lower Extremity: Palpation:Pulses- Bilateral- pulses intact. Neurologic Neurologic evaluation reveals - normal sensation and upper and lower extremity deep tendon reflexes intact bilaterally . Musculoskeletal Right knee ROM today is intact, but he still has pain with motion. Mild tenderness to palpation globally about the knee. He has a knee effusion. Popliteal space is fine. Ligaments intact. His calf is soft and nontender.    Assessment & Plan Osteoarthritis, Knee (715.96) Right Total Knee Arthrooplasty There is always the possibility of developing an early or late infection in the knee which is extremely rare. We do try to protect you with antibiotics prior to the surgery. Also second complication that is rare and it could occur are blood clots in the leg so we do require in people that are not allergic to aspirin to use two aspirin a day the day immediately after surgery, begin those immediately after surgery and then two each day, one breakfast, one supper for two weeks as an anticoagulant to prevent any blood clots. In total knee replacements we do use what we term PAS hose. These are pressure hose that are put on the legs in the recovery room to try to prevent blood clots and we also use blood thinners to try to prevent blood clots for total knees.  Dimitri Ped, PA-C

## 2012-01-09 ENCOUNTER — Encounter (HOSPITAL_COMMUNITY): Payer: Self-pay | Admitting: *Deleted

## 2012-01-09 ENCOUNTER — Inpatient Hospital Stay (HOSPITAL_COMMUNITY)
Admission: RE | Admit: 2012-01-09 | Discharge: 2012-01-12 | DRG: 470 | Disposition: A | Discharge status: Home Health | Payer: Medicare Other | Source: Ambulatory Visit | Attending: Orthopedic Surgery | Admitting: Orthopedic Surgery

## 2012-01-09 ENCOUNTER — Encounter (HOSPITAL_COMMUNITY)
Admission: RE | Disposition: A | Discharge status: Home Health | Payer: Self-pay | Source: Ambulatory Visit | Attending: Orthopedic Surgery

## 2012-01-09 ENCOUNTER — Inpatient Hospital Stay (HOSPITAL_COMMUNITY): Payer: Medicare Other

## 2012-01-09 ENCOUNTER — Inpatient Hospital Stay (HOSPITAL_COMMUNITY): Payer: Medicare Other | Admitting: *Deleted

## 2012-01-09 DIAGNOSIS — Z96659 Presence of unspecified artificial knee joint: Secondary | ICD-10-CM

## 2012-01-09 DIAGNOSIS — M1710 Unilateral primary osteoarthritis, unspecified knee: Principal | ICD-10-CM | POA: Diagnosis present

## 2012-01-09 DIAGNOSIS — D62 Acute posthemorrhagic anemia: Secondary | ICD-10-CM | POA: Diagnosis not present

## 2012-01-09 DIAGNOSIS — E039 Hypothyroidism, unspecified: Secondary | ICD-10-CM | POA: Diagnosis present

## 2012-01-09 DIAGNOSIS — E876 Hypokalemia: Secondary | ICD-10-CM | POA: Diagnosis not present

## 2012-01-09 DIAGNOSIS — I10 Essential (primary) hypertension: Secondary | ICD-10-CM | POA: Diagnosis present

## 2012-01-09 HISTORY — PX: TOTAL KNEE ARTHROPLASTY: SHX125

## 2012-01-09 LAB — TYPE AND SCREEN
ABO/RH(D): O POS
Antibody Screen: NEGATIVE

## 2012-01-09 SURGERY — ARTHROPLASTY, KNEE, TOTAL
Anesthesia: General | Site: Knee | Laterality: Right | Wound class: Clean

## 2012-01-09 MED ORDER — FENTANYL CITRATE 0.05 MG/ML IJ SOLN
INTRAMUSCULAR | Status: DC | PRN
  Administered 2012-01-09: 100 ug via INTRAVENOUS
  Administered 2012-01-09 (×5): 50 ug via INTRAVENOUS

## 2012-01-09 MED ORDER — LACTATED RINGERS IV SOLN: INTRAVENOUS | Status: DC

## 2012-01-09 MED ORDER — HYDROMORPHONE HCL PF 1 MG/ML IJ SOLN: 0.5000 mg | INTRAMUSCULAR | Status: DC | PRN

## 2012-01-09 MED ORDER — FLEET ENEMA 7-19 GM/118ML RE ENEM: 1.0000 | ENEMA | Freq: Once | RECTAL | Status: AC | PRN

## 2012-01-09 MED ORDER — BUPIVACAINE LIPOSOME 1.3 % IJ SUSP
20.0000 mL | INTRAMUSCULAR | Status: DC
  Filled 2012-01-09: qty 20

## 2012-01-09 MED ORDER — LABETALOL HCL 200 MG PO TABS
200.0000 mg | ORAL_TABLET | Freq: Two times a day (BID) | ORAL | Status: DC
  Administered 2012-01-09 – 2012-01-12 (×5): 200 mg via ORAL
  Filled 2012-01-09 (×7): qty 1

## 2012-01-09 MED ORDER — METHOCARBAMOL 100 MG/ML IJ SOLN
500.0000 mg | Freq: Four times a day (QID) | INTRAVENOUS | Status: DC | PRN
  Administered 2012-01-09 (×2): 500 mg via INTRAVENOUS
  Filled 2012-01-09 (×2): qty 5

## 2012-01-09 MED ORDER — GLYCOPYRROLATE 0.2 MG/ML IJ SOLN
INTRAMUSCULAR | Status: DC | PRN
  Administered 2012-01-09: .4 mg via INTRAVENOUS

## 2012-01-09 MED ORDER — HYDROMORPHONE HCL PF 1 MG/ML IJ SOLN: 0.2500 mg | INTRAMUSCULAR | Status: DC | PRN

## 2012-01-09 MED ORDER — MIDAZOLAM HCL 5 MG/5ML IJ SOLN
INTRAMUSCULAR | Status: DC | PRN
  Administered 2012-01-09 (×2): 1 mg via INTRAVENOUS

## 2012-01-09 MED ORDER — AMLODIPINE BESYLATE 5 MG PO TABS
5.0000 mg | ORAL_TABLET | ORAL | Status: DC
  Administered 2012-01-09 – 2012-01-11 (×3): 5 mg via ORAL
  Filled 2012-01-09 (×4): qty 1

## 2012-01-09 MED ORDER — NEOSTIGMINE METHYLSULFATE 1 MG/ML IJ SOLN
INTRAMUSCULAR | Status: DC | PRN
  Administered 2012-01-09: 3 mg via INTRAVENOUS

## 2012-01-09 MED ORDER — LACTATED RINGERS IV SOLN
INTRAVENOUS | Status: DC | PRN
  Administered 2012-01-09 (×3): via INTRAVENOUS

## 2012-01-09 MED ORDER — FENOFIBRATE 160 MG PO TABS
160.0000 mg | ORAL_TABLET | Freq: Every evening | ORAL | Status: DC
  Administered 2012-01-09 – 2012-01-11 (×3): 160 mg via ORAL
  Filled 2012-01-09 (×4): qty 1

## 2012-01-09 MED ORDER — FERROUS SULFATE 325 (65 FE) MG PO TABS
325.0000 mg | ORAL_TABLET | Freq: Three times a day (TID) | ORAL | Status: DC
  Administered 2012-01-09 – 2012-01-12 (×8): 325 mg via ORAL
  Filled 2012-01-09 (×10): qty 1

## 2012-01-09 MED ORDER — BACITRACIN ZINC 500 UNIT/GM EX OINT
TOPICAL_OINTMENT | CUTANEOUS | Status: DC | PRN
  Administered 2012-01-09: 1 via TOPICAL

## 2012-01-09 MED ORDER — ACETAMINOPHEN 325 MG PO TABS: 650.0000 mg | ORAL_TABLET | Freq: Four times a day (QID) | ORAL | Status: DC | PRN

## 2012-01-09 MED ORDER — ACETAMINOPHEN 650 MG RE SUPP: 650.0000 mg | Freq: Four times a day (QID) | RECTAL | Status: DC | PRN

## 2012-01-09 MED ORDER — CLINDAMYCIN PHOSPHATE 600 MG/50ML IV SOLN
INTRAVENOUS | Status: DC | PRN
  Administered 2012-01-09: 600 mg via INTRAVENOUS

## 2012-01-09 MED ORDER — SIMVASTATIN 5 MG PO TABS
5.0000 mg | ORAL_TABLET | Freq: Every day | ORAL | Status: DC
  Administered 2012-01-09 – 2012-01-11 (×3): 5 mg via ORAL
  Filled 2012-01-09 (×4): qty 1

## 2012-01-09 MED ORDER — EPHEDRINE SULFATE 50 MG/ML IJ SOLN
INTRAMUSCULAR | Status: DC | PRN
  Administered 2012-01-09 (×3): 5 mg via INTRAVENOUS

## 2012-01-09 MED ORDER — ONDANSETRON HCL 4 MG/2ML IJ SOLN
INTRAMUSCULAR | Status: DC | PRN
  Administered 2012-01-09 (×2): 1 mg via INTRAVENOUS
  Administered 2012-01-09: 2 mg via INTRAVENOUS

## 2012-01-09 MED ORDER — ONDANSETRON HCL 4 MG/2ML IJ SOLN: 4.0000 mg | Freq: Four times a day (QID) | INTRAMUSCULAR | Status: DC | PRN

## 2012-01-09 MED ORDER — CLINDAMYCIN PHOSPHATE 600 MG/50ML IV SOLN
600.0000 mg | Freq: Four times a day (QID) | INTRAVENOUS | Status: AC
  Administered 2012-01-09 – 2012-01-10 (×3): 600 mg via INTRAVENOUS
  Filled 2012-01-09 (×3): qty 50

## 2012-01-09 MED ORDER — HYDROCHLOROTHIAZIDE 25 MG PO TABS
25.0000 mg | ORAL_TABLET | Freq: Every evening | ORAL | Status: DC
  Administered 2012-01-09 – 2012-01-11 (×3): 25 mg via ORAL
  Filled 2012-01-09 (×4): qty 1

## 2012-01-09 MED ORDER — PHENOL 1.4 % MT LIQD
1.0000 | OROMUCOSAL | Status: DC | PRN
  Filled 2012-01-09: qty 177

## 2012-01-09 MED ORDER — POLYETHYLENE GLYCOL 3350 17 G PO PACK
17.0000 g | PACK | Freq: Every day | ORAL | Status: DC | PRN
  Filled 2012-01-09: qty 1

## 2012-01-09 MED ORDER — BUPIVACAINE LIPOSOME 1.3 % IJ SUSP
INTRAMUSCULAR | Status: DC | PRN
  Administered 2012-01-09: 50 mL

## 2012-01-09 MED ORDER — ALUM & MAG HYDROXIDE-SIMETH 200-200-20 MG/5ML PO SUSP
30.0000 mL | ORAL | Status: DC | PRN
  Administered 2012-01-10 (×2): 30 mL via ORAL
  Filled 2012-01-09 (×2): qty 30

## 2012-01-09 MED ORDER — SODIUM CHLORIDE 0.9 % IR SOLN
Status: DC | PRN
  Administered 2012-01-09: 09:00:00

## 2012-01-09 MED ORDER — RIVAROXABAN 10 MG PO TABS
10.0000 mg | ORAL_TABLET | Freq: Every day | ORAL | Status: DC
  Administered 2012-01-10 – 2012-01-12 (×3): 10 mg via ORAL
  Filled 2012-01-09 (×3): qty 1

## 2012-01-09 MED ORDER — BISACODYL 10 MG RE SUPP
10.0000 mg | Freq: Every day | RECTAL | Status: DC | PRN
  Administered 2012-01-10: 10 mg via RECTAL
  Filled 2012-01-09: qty 1

## 2012-01-09 MED ORDER — METHOCARBAMOL 500 MG PO TABS
500.0000 mg | ORAL_TABLET | Freq: Four times a day (QID) | ORAL | Status: DC | PRN
  Administered 2012-01-10: 500 mg via ORAL
  Filled 2012-01-09: qty 1

## 2012-01-09 MED ORDER — LIDOCAINE HCL (CARDIAC) 20 MG/ML IV SOLN
INTRAVENOUS | Status: DC | PRN
  Administered 2012-01-09: 75 mg via INTRAVENOUS

## 2012-01-09 MED ORDER — THROMBIN 5000 UNITS EX SOLR
OROMUCOSAL | Status: DC | PRN
  Administered 2012-01-09: 09:00:00 via TOPICAL

## 2012-01-09 MED ORDER — ONDANSETRON HCL 4 MG PO TABS: 4.0000 mg | ORAL_TABLET | Freq: Four times a day (QID) | ORAL | Status: DC | PRN

## 2012-01-09 MED ORDER — CLINDAMYCIN PHOSPHATE 600 MG/50ML IV SOLN: 600.0000 mg | INTRAVENOUS | Status: DC

## 2012-01-09 MED ORDER — PROPOFOL 10 MG/ML IV EMUL
INTRAVENOUS | Status: DC | PRN
  Administered 2012-01-09: 200 mg via INTRAVENOUS

## 2012-01-09 MED ORDER — HYDROMORPHONE HCL 2 MG PO TABS
2.0000 mg | ORAL_TABLET | ORAL | Status: DC | PRN
  Administered 2012-01-09 – 2012-01-12 (×9): 2 mg via ORAL
  Filled 2012-01-09 (×10): qty 1

## 2012-01-09 MED ORDER — LEVOTHYROXINE SODIUM 50 MCG PO TABS
50.0000 ug | ORAL_TABLET | Freq: Every day | ORAL | Status: DC
  Administered 2012-01-10 – 2012-01-12 (×3): 50 ug via ORAL
  Filled 2012-01-09 (×5): qty 1

## 2012-01-09 MED ORDER — LACTATED RINGERS IV SOLN
INTRAVENOUS | Status: DC
  Administered 2012-01-09 – 2012-01-10 (×3): via INTRAVENOUS

## 2012-01-09 MED ORDER — PROMETHAZINE HCL 25 MG/ML IJ SOLN: 6.2500 mg | INTRAMUSCULAR | Status: DC | PRN

## 2012-01-09 MED ORDER — CISATRACURIUM BESYLATE 2 MG/ML IV SOLN
INTRAVENOUS | Status: DC | PRN
  Administered 2012-01-09: 2 mg via INTRAVENOUS
  Administered 2012-01-09: 6 mg via INTRAVENOUS

## 2012-01-09 MED ORDER — MENTHOL 3 MG MT LOZG
1.0000 | LOZENGE | OROMUCOSAL | Status: DC | PRN
  Filled 2012-01-09: qty 9

## 2012-01-09 MED ORDER — ACETAMINOPHEN 10 MG/ML IV SOLN
INTRAVENOUS | Status: DC | PRN
  Administered 2012-01-09: 1000 mg via INTRAVENOUS

## 2012-01-09 MED ORDER — SUCCINYLCHOLINE CHLORIDE 20 MG/ML IJ SOLN
INTRAMUSCULAR | Status: DC | PRN
  Administered 2012-01-09: 100 mg via INTRAVENOUS

## 2012-01-09 SURGICAL SUPPLY — 67 items
BAG SPEC THK2 15X12 ZIP CLS (MISCELLANEOUS)
BAG ZIPLOCK 12X15 (MISCELLANEOUS) ×1 IMPLANT
BANDAGE ACE 4 STERILE (GAUZE/BANDAGES/DRESSINGS) ×1 IMPLANT
BANDAGE ELASTIC 4 VELCRO ST LF (GAUZE/BANDAGES/DRESSINGS) ×2 IMPLANT
BANDAGE ELASTIC 6 VELCRO ST LF (GAUZE/BANDAGES/DRESSINGS) ×2 IMPLANT
BANDAGE ESMARK 6X9 LF (GAUZE/BANDAGES/DRESSINGS) ×1 IMPLANT
BANDAGE GAUZE ELAST BULKY 4 IN (GAUZE/BANDAGES/DRESSINGS) ×2 IMPLANT
BLADE SAG 18X100X1.27 (BLADE) ×2 IMPLANT
BLADE SAW SGTL 13.0X1.19X90.0M (BLADE) ×2 IMPLANT
BNDG CMPR 9X6 STRL LF SNTH (GAUZE/BANDAGES/DRESSINGS) ×1
BNDG ESMARK 6X9 LF (GAUZE/BANDAGES/DRESSINGS) ×2
BONE CEMENT GENTAMICIN (Cement) ×6 IMPLANT
BOWL SMART MIX CTS (DISPOSABLE) ×1 IMPLANT
CEMENT BONE GENTAMICIN 40 (Cement) ×2 IMPLANT
CLOTH BEACON ORANGE TIMEOUT ST (SAFETY) ×2 IMPLANT
CUFF TOURN SGL QUICK 34 (TOURNIQUET CUFF) ×2
CUFF TRNQT CYL 34X4X40X1 (TOURNIQUET CUFF) ×1 IMPLANT
DRAPE EXTREMITY T 121X128X90 (DRAPE) ×2 IMPLANT
DRAPE INCISE IOBAN 66X45 STRL (DRAPES) ×2 IMPLANT
DRAPE LG THREE QUARTER DISP (DRAPES) ×4 IMPLANT
DRAPE POUCH INSTRU U-SHP 10X18 (DRAPES) ×2 IMPLANT
DRAPE U-SHAPE 47X51 STRL (DRAPES) ×2 IMPLANT
DRSG ADAPTIC 3X8 NADH LF (GAUZE/BANDAGES/DRESSINGS) ×2 IMPLANT
DRSG PAD ABDOMINAL 8X10 ST (GAUZE/BANDAGES/DRESSINGS) ×5 IMPLANT
DURAPREP 26ML APPLICATOR (WOUND CARE) ×2 IMPLANT
ELECT REM PT RETURN 9FT ADLT (ELECTROSURGICAL) ×2
ELECTRODE REM PT RTRN 9FT ADLT (ELECTROSURGICAL) ×1 IMPLANT
EVACUATOR 1/8 PVC DRAIN (DRAIN) ×2 IMPLANT
FACESHIELD LNG OPTICON STERILE (SAFETY) ×11 IMPLANT
FLOSEAL 10ML (HEMOSTASIS) ×1 IMPLANT
GAUZE KERLIX 2  STERILE LF (GAUZE/BANDAGES/DRESSINGS) ×1 IMPLANT
GAUZE SPONGE 4X4 12PLY STRL LF (GAUZE/BANDAGES/DRESSINGS) ×1 IMPLANT
GLOVE BIOGEL PI IND STRL 8.5 (GLOVE) ×1 IMPLANT
GLOVE BIOGEL PI INDICATOR 8.5 (GLOVE) ×1
GLOVE ECLIPSE 8.0 STRL XLNG CF (GLOVE) ×7 IMPLANT
GOWN PREVENTION PLUS LG XLONG (DISPOSABLE) ×4 IMPLANT
GOWN STRL REIN XL XLG (GOWN DISPOSABLE) ×4 IMPLANT
HANDPIECE INTERPULSE COAX TIP (DISPOSABLE) ×2
IMMOBILIZER KNEE 20 (SOFTGOODS) ×2
IMMOBILIZER KNEE 20 THIGH 36 (SOFTGOODS) IMPLANT
KIT BASIN OR (CUSTOM PROCEDURE TRAY) ×2 IMPLANT
MANIFOLD NEPTUNE II (INSTRUMENTS) ×2 IMPLANT
NEEDLE HYPO 22GX1.5 SAFETY (NEEDLE) ×2 IMPLANT
NS IRRIG 1000ML POUR BTL (IV SOLUTION) ×1 IMPLANT
PACK TOTAL JOINT (CUSTOM PROCEDURE TRAY) ×2 IMPLANT
PAD ABD 7.5X8 STRL (GAUZE/BANDAGES/DRESSINGS) ×4 IMPLANT
POSITIONER SURGICAL ARM (MISCELLANEOUS) ×2 IMPLANT
SET HNDPC FAN SPRY TIP SCT (DISPOSABLE) ×1 IMPLANT
SET PAD KNEE POSITIONER (MISCELLANEOUS) ×2 IMPLANT
SLEEVE SURGEON STRL (DRAPES) ×1 IMPLANT
SPONGE LAP 18X18 X RAY DECT (DISPOSABLE) ×2 IMPLANT
SPONGE SURGIFOAM ABS GEL 100 (HEMOSTASIS) ×2 IMPLANT
STAPLER VISISTAT 35W (STAPLE) IMPLANT
SUCTION FRAZIER 12FR DISP (SUCTIONS) ×2 IMPLANT
SUT BONE WAX W31G (SUTURE) ×2 IMPLANT
SUT VIC AB 0 CT1 27 (SUTURE) ×4
SUT VIC AB 0 CT1 27XBRD ANTBC (SUTURE) ×2 IMPLANT
SUT VIC AB 1 CT1 27 (SUTURE) ×10
SUT VIC AB 1 CT1 27XBRD ANTBC (SUTURE) ×5 IMPLANT
SUT VIC AB 2-0 CT1 27 (SUTURE) ×6
SUT VIC AB 2-0 CT1 TAPERPNT 27 (SUTURE) ×3 IMPLANT
SYR 20CC LL (SYRINGE) ×2 IMPLANT
TOWEL OR 17X26 10 PK STRL BLUE (TOWEL DISPOSABLE) ×4 IMPLANT
TOWER CARTRIDGE SMART MIX (DISPOSABLE) ×2 IMPLANT
TRAY FOLEY CATH 14FRSI W/METER (CATHETERS) ×2 IMPLANT
WATER STERILE IRR 1500ML POUR (IV SOLUTION) ×2 IMPLANT
WRAP KNEE MAXI GEL POST OP (GAUZE/BANDAGES/DRESSINGS) ×4 IMPLANT

## 2012-01-09 NOTE — Anesthesia Postprocedure Evaluation (Signed)
  Anesthesia Post-op Note  Patient: [name]  Procedure(s) Performed: Procedure(s) (LRB): TOTAL KNEE ARTHROPLASTY (Right)  Patient Location: PACU  Anesthesia Type: General  Level of Consciousness: oriented and sedated  Airway and Oxygen Therapy: Patient Spontanous Breathing and Patient connected to nasal cannula oxygen  Post-op Pain: mild  Post-op Assessment: Post-op Vital signs reviewed, Patient's Cardiovascular Status Stable, Respiratory Function Stable and Patent Airway  Post-op Vital Signs: stable  Complications: No apparent anesthesia complications

## 2012-01-09 NOTE — Transfer of Care (Signed)
Immediate Anesthesia Transfer of Care Note  Patient: [name]  Procedure(s) Performed: Procedure(s) (LRB): TOTAL KNEE ARTHROPLASTY (Right)  Patient Location: PACU  Anesthesia Type: General  Level of Consciousness: awake, oriented, sedated, patient cooperative, lethargic and responds to stimulation  Airway & Oxygen Therapy: Patient Spontanous Breathing and Patient connected to face mask oxygen  Post-op Assessment: Report given to PACU RN, Post -op Vital signs reviewed and stable and Patient moving all extremities  Post vital signs: Reviewed and stable  Complications: No apparent anesthesia complications

## 2012-01-09 NOTE — Brief Op Note (Signed)
01/09/2012  11:07 AM  PATIENT:  [name]  66 y.o. male  PRE-OPERATIVE DIAGNOSIS:  osteoarthritis right knee  POST-OPERATIVE DIAGNOSIS:  right knee degenerative osteoarthritis  PROCEDURE:  Procedure(s) (LRB): TOTAL KNEE ARTHROPLASTY (Right)  SURGEON:  Surgeon(s) and Role:    * Jacki Cones, MD - Primary  PHYSICIAN ASSISTANT:   ASSISTANTS: Dimitri Ped PA   ANESTHESIA:   general  EBL:  Total I/O In: 1000 [I.V.:1000] Out: 150 [Urine:100; Blood:50]  BLOOD ADMINISTERED:none  DRAINS: (1) Hemovact drain(s) in the Right with  Suction Open   LOCAL MEDICATIONS USED:  BUPIVICAINE   SPECIMEN:  No Specimen  DISPOSITION OF SPECIMEN:  N/A  COUNTS:  YES  TOURNIQUET:   Total Tourniquet Time Documented: Thigh (Right) - 121 minutes  DICTATION: .Other Dictation: Dictation Number [id number]  PLAN OF CARE: Admit to inpatient   PATIENT DISPOSITION:  PACU - hemodynamically stable.   Delay start of Pharmacological VTE agent (>24hrs) due to surgical blood loss or risk of bleeding: yes

## 2012-01-09 NOTE — Preoperative (Signed)
Beta Blockers   Reason not to administer Beta Blockers:Not Applicable 

## 2012-01-09 NOTE — Interval H&P Note (Signed)
History and Physical Interval Note:  01/09/2012 8:20 AM  [name]  has presented today for surgery, with the diagnosis of osteoarthritis right knee  The various methods of treatment have been discussed with the patient and family. After consideration of risks, benefits and other options for treatment, the patient has consented to  Procedure(s) (LRB): TOTAL KNEE ARTHROPLASTY (Right) as a surgical intervention .  The patients' history has been reviewed, patient examined, no change in status, stable for surgery.  I have reviewed the patients' chart and labs.  Questions were answered to the patient's satisfaction.     Ival Pacer A

## 2012-01-09 NOTE — Anesthesia Preprocedure Evaluation (Signed)
Anesthesia Evaluation  Patient identified by MRN, date of birth, ID band Patient awake    Reviewed: Allergy & Precautions, H&P , NPO status , Patient's Chart, lab work & pertinent test results, reviewed documented beta blocker date and time   Airway Mallampati: II TM Distance: >3 FB Neck ROM: Full    Dental  (+) Partial Upper and Dental Advisory Given   Pulmonary neg pulmonary ROS,  clear to auscultation        Cardiovascular hypertension, Pt. on medications Regular Normal Denies cardiac symptoms Given CV clearance   Neuro/Psych Negative Neurological ROS  Negative Psych ROS   GI/Hepatic negative GI ROS, Neg liver ROS,   Endo/Other  Hypothyroidism Thyroid replacement  Renal/GU negative Renal ROS  Genitourinary negative   Musculoskeletal negative musculoskeletal ROS (+)   Abdominal   Peds negative pediatric ROS (+)  Hematology negative hematology ROS (+)   Anesthesia Other Findings   Reproductive/Obstetrics negative OB ROS                           Anesthesia Physical Anesthesia Plan  ASA: II  Anesthesia Plan: General   Post-op Pain Management:    Induction: Intravenous  Airway Management Planned: Oral ETT  Additional Equipment:   Intra-op Plan:   Post-operative Plan: Extubation in OR  Informed Consent: I have reviewed the patients History and Physical, chart, labs and discussed the procedure including the risks, benefits and alternatives for the proposed anesthesia with the patient or authorized representative who has indicated his/her understanding and acceptance.   Dental advisory given  Plan Discussed with: CRNA and Surgeon  Anesthesia Plan Comments:         Anesthesia Quick Evaluation

## 2012-01-10 LAB — BASIC METABOLIC PANEL
BUN: 10 mg/dL (ref 6–23)
CO2: 28 mEq/L (ref 19–32)
Calcium: 8.9 mg/dL (ref 8.4–10.5)
Creatinine, Ser: 0.92 mg/dL (ref 0.50–1.35)
Glucose, Bld: 142 mg/dL — ABNORMAL HIGH (ref 70–99)
Sodium: 137 mEq/L (ref 135–145)

## 2012-01-10 LAB — CBC
HCT: 38.1 % — ABNORMAL LOW (ref 39.0–52.0)
Hemoglobin: 12.8 g/dL — ABNORMAL LOW (ref 13.0–17.0)
MCH: 30.4 pg (ref 26.0–34.0)
MCHC: 33.6 g/dL (ref 30.0–36.0)
MCV: 90.5 fL (ref 78.0–100.0)
RBC: 4.21 MIL/uL — ABNORMAL LOW (ref 4.22–5.81)

## 2012-01-10 MED FILL — Sodium Chloride Inj 0.9%: INTRAMUSCULAR | Qty: 50 | Status: AC

## 2012-01-10 MED FILL — Neomycin-Bacitracin-Polymyxin Oint: CUTANEOUS | Qty: 30 | Status: AC

## 2012-01-10 MED FILL — Thrombin For Soln Kit 5000 Unit: CUTANEOUS | Qty: 2 | Status: AC

## 2012-01-10 NOTE — Op Note (Signed)
NAMEMATAS, BURROWS NO.:  [id number]  MEDICAL RECORD NO.:  [id number]  LOCATION:  1602                         FACILITY:  [facility]  PHYSICIAN:  Georges Lynch. Johnie Stadel, M.D.DATE OF BIRTH:  06-Nov-1946  DATE OF PROCEDURE: DATE OF DISCHARGE:                              OPERATIVE REPORT   SURGEON:  Nataleah Scioneaux A. [name], M.D.  ASSISTANT:  Dimitri Ped, [place].  PREOPERATIVE DIAGNOSIS:  Severe degenerative arthritis of the right knee with complete bone-on-bone and a severe flexion contracture.  Note, we went through the appropriate conservative treatment which failed, so the only thing left to do as I mentioned was a total knee arthroplasty.  OPERATION:  I utilized a DePuy total knee arthroplasty.  All 3 components were cemented.  The patella was a size 41 with __________. The femoral component was a size 3 right.  The tibial tray was a size 4. The insert was a size 3, 10-mm thickness rotating platform.  Gentamicin was used in the cement.  PROCEDURE IN DETAIL:  Under general anesthesia, routine orthopedic prepping and draping of the right lower extremity was carried out.  The appropriate time-out was carried out prior to surgery.  Prior to this, I marked the appropriate right leg in the holding area.  At this particular time, after doing sterile prepping and draping, I utilized the Esmarch to exsanguinate the right lower extremity.  Tourniquet was elevated to 325 mm.  Note, I did use the [facility].  Following that, incision was made over the anterior aspect of the right knee. Bleeders identified and cauterized.  Two flaps were created.  I then carried out a median parapatellar incision.  I carried out medial and lateral meniscectomies and did anterior and posterior cruciate ligament excision.  Of note, the knee was extremely tight.  This was a very complex highlighted case, and put that up above for surgery.  Following that, my initial drill hole was made in  the intercondylar notch.  I removed 12 mm thickness off the distal femur because of the extreme contracture.  I then inserted my next jig and measured the femur to be a size 3, a 3rd jig was inserted, carried out anterior-posterior chamfer cuts for size 3 right femur.  Following that, I then prepared the tibia in the usual fashion.  I removed 4 mm thickness off the affected medial side.  Note, the knee was extremely tight.  So, I had to do a nice medial release.  He had viewed large fragments of spurs.  The knee was severely contracted.  After this, I utilized the space of the spacer blocks after we made sure that we were clear posteriorly on the condyles.  Following that, I then continued to prepare the tibial cut, the keel cut of the tibia.  I then cut my notch cut out of the femur.  I thoroughly irrigated out the knee, inserted the trial components. Following that, I then put the knee in extension.  I then did a resurfacing procedure on the patella in the usual fashion.  Three drill holes were made on the patella.  At this particular time, all components were removed.  I thoroughly water  picked out the knee, cemented all 3 components in simultaneously.  We searched for loose pieces of cement, thoroughly irrigated out the knee.  I then finally inserted the size 4, 10 mm thickness rotating platform, reduced the knee.  I then inserted a Hemovac drain.  The wound was closed in layers in the usual fashion. Sterile dressings were applied.          ______________________________ Georges Lynch [name], M.D.     RAG/MEDQ  D:  01/09/2012  T:  01/10/2012  Job:  [id number]  cc:   Corwin Levins, MD [address] [place] [address]  Willette Pa, MD Fax: [phone]

## 2012-01-10 NOTE — Progress Notes (Signed)
Physical Therapy Treatment Patient Details Name: [name] MRN: [id number] DOB: 07-Nov-1946 Today's Date: 01/10/2012  PT Assessment/Plan  PT - Assessment/Plan Comments on Treatment Session: Pt continues to ambulate and perform exercises well with little c/o pain.  Pt to D/c Sat.  PT Plan: Discharge plan remains appropriate PT Frequency: 7X/week Follow Up Recommendations: Home health PT Equipment Recommended: None recommended by PT PT Goals  Acute Rehab PT Goals PT Goal Formulation: With patient Time For Goal Achievement: 5 days Pt will go Supine/Side to Sit: with supervision PT Goal: Supine/Side to Sit - Progress: Goal set today Pt will go Sit to Supine/Side: with supervision PT Goal: Sit to Supine/Side - Progress: Progressing toward goal Pt will go Sit to Stand: with supervision PT Goal: Sit to Stand - Progress: Progressing toward goal Pt will go Stand to Sit: with modified independence PT Goal: Stand to Sit - Progress: Progressing toward goal Pt will Ambulate: 51 - 150 feet;with supervision;with least restrictive assistive device PT Goal: Ambulate - Progress: Progressing toward goal Pt will Go Up / Down Stairs: 3-5 stairs;with min assist;with least restrictive assistive device PT Goal: Up/Down Stairs - Progress: Goal set today Pt will Perform Home Exercise Program: with supervision, verbal cues required/provided PT Goal: Perform Home Exercise Program - Progress: Progressing toward goal  PT Treatment Precautions/Restrictions  Precautions Precautions: Knee Required Braces or Orthoses: Yes Knee Immobilizer: Discontinue once straight leg raise with < 10 degree lag Restrictions Weight Bearing Restrictions: Yes RLE Weight Bearing: Weight bearing as tolerated Mobility (including Balance) Bed Mobility Bed Mobility: Yes Supine to Sit: 4: Min assist Supine to Sit Details (indicate cue type and reason): Requires assist with RLE. Cues given for hand placement to assist trunk.    Transfers Transfers: Yes Sit to Stand: 4: Min assist;With upper extremity assist;With armrests;From chair/3-in-1 Sit to Stand Details (indicate cue type and reason): Min/guard for safety with cues for hand placement on bed and RLE management.  Stand to Sit: 4: Min assist;With upper extremity assist;To bed Stand to Sit Details: Min/guard for safety with cues for controlled descent, hand placement and RLE mangement.  Ambulation/Gait Ambulation/Gait: Yes Ambulation/Gait Assistance: 4: Min assist Ambulation/Gait Assistance Details (indicate cue type and reason): Min/guard for safety with cues for sequencing/technique with RW.  Ambulation Distance (Feet): 15 Feet Assistive device: Rolling walker Gait Pattern: Step-to pattern;Decreased step length - left;Decreased stance time - right Gait velocity: decreased Stairs: No Wheelchair Mobility Wheelchair Mobility: No    Exercise  Total Joint Exercises Ankle Circles/Pumps: AROM;Both;20 reps;Supine Quad Sets: AROM;Right;10 reps;Supine Short Arc Quad: AROM;Right;10 reps;Supine Heel Slides: AAROM;Right;10 reps;Supine Hip ABduction/ADduction: AAROM;Right;10 reps;Supine Straight Leg Raises: AAROM;Right;10 reps;Supine End of Session PT - End of Session Equipment Utilized During Treatment: Gait belt;Right knee immobilizer Activity Tolerance: Patient tolerated treatment well Patient left: in bed;with call bell in reach;with family/visitor present Nurse Communication: Mobility status for transfers;Mobility status for ambulation General Behavior During Session: [facility] for tasks performed Cognition: [facility] for tasks performed  Page, [name] 01/10/2012, 4:44 PM

## 2012-01-10 NOTE — Evaluation (Signed)
Physical Therapy Evaluation Patient Details Name: [name] MRN: [id number] DOB: 1946/03/09 Today's Date: 01/10/2012  Problem List:  Patient Active Problem List  Diagnoses  . HYPERTHYROIDISM  . HYPOTHYROIDISM  . HYPERLIPIDEMIA  . HYPERTENSION  . ALLERGIC RHINITIS  . PULMONARY NODULE  . GERD  . BENIGN PROSTATIC HYPERTROPHY  . OSTEOARTHRITIS, KNEE, RIGHT  . PERIPHERAL EDEMA  . Headache  . WOUND, LEG  . COLONIC POLYPS, HX OF  . Preventative health care  . Preop exam for internal medicine  . URI (upper respiratory infection)  . Degenerative arthritis of right knee    Past Medical History:  Past Medical History  Diagnosis Date  . ALLERGIC RHINITIS 11/06/2007  . BENIGN PROSTATIC HYPERTROPHY 07/14/2007  . COLONIC POLYPS, HX OF 07/14/2007  . GERD 07/11/2007  . Headache 07/11/2007  . HYPERLIPIDEMIA 07/11/2007  . HYPERTENSION 07/11/2007  . HYPERTHYROIDISM 07/14/2007  . HYPOTHYROIDISM 11/06/2007  . OSTEOARTHRITIS, KNEE, RIGHT 10/27/2008  . PERIPHERAL EDEMA 05/31/2009  . PULMONARY NODULE 06/24/2008    granuloma  . WOUND, LEG 05/31/2009  . Degenerative arthritis of right knee 12/12/2011   Past Surgical History:  Past Surgical History  Procedure Date  . Neck surgery   . Nissen fundoplication 2000  . Back surgery 1986    x 2  1986 C 3  . Hernia repair sept 1986  . Left knee replacement Nov 21, 2007  . Right heel bone spur 1987    PT Assessment/Plan/Recommendation PT Assessment Clinical Impression Statement: Pt presents s/p R TKA POD1 with decreased strength, mobility, and balance.  Pt tolerates ambulation very well this am.  Pt familiar with situation, as he has had a L TKA in past.  Pt will benefit from skilled PT in acute venue to address deficits.  PT recommends HHPT for follow up at D/C to return to prior level of functioning.  PT Recommendation/Assessment: Patient will need skilled PT in the acute care venue PT Problem List: Decreased strength;Decreased range of  motion;Decreased activity tolerance;Decreased balance;Decreased mobility;Decreased coordination;Decreased knowledge of use of DME Barriers to Discharge: None PT Therapy Diagnosis : Difficulty walking;Abnormality of gait;Generalized weakness;Acute pain PT Plan PT Frequency: 7X/week PT Treatment/Interventions: DME instruction;Gait training;Stair training;Functional mobility training;Therapeutic activities;Therapeutic exercise;Balance training;Patient/family education PT Recommendation Follow Up Recommendations: Home health PT Equipment Recommended: None recommended by PT PT Goals  Acute Rehab PT Goals PT Goal Formulation: With patient Time For Goal Achievement: 5 days Pt will go Supine/Side to Sit: with supervision PT Goal: Supine/Side to Sit - Progress: Goal set today Pt will go Sit to Supine/Side: with supervision PT Goal: Sit to Supine/Side - Progress: Goal set today Pt will go Sit to Stand: with supervision PT Goal: Sit to Stand - Progress: Goal set today Pt will go Stand to Sit: with modified independence PT Goal: Stand to Sit - Progress: Goal set today Pt will Ambulate: 51 - 150 feet;with supervision;with least restrictive assistive device PT Goal: Ambulate - Progress: Goal set today Pt will Go Up / Down Stairs: 3-5 stairs;with min assist;with least restrictive assistive device PT Goal: Up/Down Stairs - Progress: Goal set today Pt will Perform Home Exercise Program: with supervision, verbal cues required/provided PT Goal: Perform Home Exercise Program - Progress: Goal set today  PT Evaluation Precautions/Restrictions  Precautions Precautions: Knee Required Braces or Orthoses: Yes Knee Immobilizer: Discontinue once straight leg raise with < 10 degree lag Restrictions Weight Bearing Restrictions: Yes RLE Weight Bearing: Weight bearing as tolerated Prior Functioning  Home Living Lives With: Spouse Receives Help  From: Family Type of Home: House Home Layout: One level Home  Access: Stairs to enter Entrance Stairs-Rails: Right;Left (cannot reach both) Entrance Stairs-Number of Steps: 4 or 5 Home Adaptive Equipment: Walker - four wheeled Prior Function Level of Independence: Independent with basic ADLs;Independent with gait;Independent with transfers Driving: Yes Vocation: Retired Producer, television/film/video: Awake/alert Overall Cognitive Status: Appears within functional limits for tasks assessed Orientation Level: Oriented X4 Sensation/Coordination Sensation Light Touch: Appears Intact Coordination Gross Motor Movements are Fluid and Coordinated: Yes Extremity Assessment RLE Assessment RLE Assessment: Exceptions to [facility] RLE Strength RLE Overall Strength Comments: Ankle motions WFL, SLR 2+/5 LLE Assessment LLE Assessment: Within Functional Limits Mobility (including Balance) Bed Mobility Bed Mobility: Yes Supine to Sit: 4: Min assist Supine to Sit Details (indicate cue type and reason): Requires assist with RLE.  Cues given for hand placement to assist trunk.  Transfers Transfers: Yes Sit to Stand: 4: Min assist;From elevated surface;With upper extremity assist;From bed Sit to Stand Details (indicate cue type and reason): Min/guard for safety with cues for hand placement on bed and RLE management.  Stand to Sit: 4: Min assist Stand to Sit Details: Min/guard for safety with cues for controlled descent, hand placement and RLE mangement.  Ambulation/Gait Ambulation/Gait: Yes Ambulation/Gait Assistance: 4: Min assist Ambulation/Gait Assistance Details (indicate cue type and reason): Min/guard for safety with cues for sequencing/technique with RW.   Ambulation Distance (Feet): 50 Feet Assistive device: Rolling walker Gait Pattern: Step-to pattern;Decreased step length - left;Decreased stance time - right Gait velocity: decreased Stairs: No Wheelchair Mobility Wheelchair Mobility: No    Exercise    End of Session PT - End of  Session Equipment Utilized During Treatment: Gait belt;Right knee immobilizer Activity Tolerance: Patient tolerated treatment well Patient left: in chair;with call bell in reach;with family/visitor present Nurse Communication: Mobility status for transfers;Mobility status for ambulation General Behavior During Session: [facility] for tasks performed Cognition: [facility] for tasks performed  Page, [name] 01/10/2012, 1:22 PM

## 2012-01-10 NOTE — Progress Notes (Signed)
OT Screen Order received, chart reviewed. Spoke briefly with patient and wife who stated that pt has all necessary DME and will have prn A upon d/c. Pt presents with no OT needs at this time. Will sign off.  Garrel Ridgel, OTR/L  Pager [phone] 01/10/2012

## 2012-01-10 NOTE — Progress Notes (Signed)
Subjective: Doin very well today. Hemovac DCd.   Objective: Vital signs in last 24 hours: Temp:  [97.5 F (36.4 C)-99.2 F (37.3 C)] 98.1 F (36.7 C) (02/14 0620) Pulse Rate:  [55-91] 91  (02/14 0620) Resp:  [10-20] 16  (02/14 0620) BP: (129-170)/(57-87) 161/83 mmHg (02/14 0620) SpO2:  [95 %-100 %] 95 % (02/14 0620) Weight:  [97.07 kg (214 lb)] 97.07 kg (214 lb) (02/13 1816)  Intake/Output from previous day: 02/13 0701 - 02/14 0700 In: 3973.3 [P.O.:390; I.V.:3583.3] Out: 3750 [Urine:2900; Drains:800; Blood:50] Intake/Output this shift: Total I/O In: 1271.7 [P.O.:150; I.V.:1121.7] Out: 2360 [Urine:2200; Drains:160]   Basename 01/10/12 0340  HGB 12.8*    Basename 01/10/12 0340  WBC 13.4*  RBC 4.21*  HCT 38.1*  PLT 190    Basename 01/10/12 0340  NA 137  K 3.5  CL 99  CO2 28  BUN 10  CREATININE 0.92  GLUCOSE 142*  CALCIUM 8.9   No results found for this basename: LABPT:2,INR:2 in the last 72 hours  Neurologically intact Dorsiflexion/Plantar flexion intact  Assessment/Plan: Plan on DC Sat.   [name] A 01/10/2012, 6:55 AM

## 2012-01-10 NOTE — Progress Notes (Signed)
UR COMPLETED  

## 2012-01-11 LAB — BASIC METABOLIC PANEL
CO2: 25 mEq/L (ref 19–32)
Calcium: 9.4 mg/dL (ref 8.4–10.5)
Creatinine, Ser: 1 mg/dL (ref 0.50–1.35)
GFR calc non Af Amer: 76 mL/min — ABNORMAL LOW (ref >90)

## 2012-01-11 LAB — CBC
MCH: 30.4 pg (ref 26.0–34.0)
MCHC: 33.6 g/dL (ref 30.0–36.0)
MCV: 90.5 fL (ref 78.0–100.0)
Platelets: 195 10*3/uL (ref 150–400)
RBC: 4.01 MIL/uL — ABNORMAL LOW (ref 4.22–5.81)

## 2012-01-11 MED ORDER — METHOCARBAMOL 500 MG PO TABS: 500.0000 mg | ORAL_TABLET | Freq: Four times a day (QID) | ORAL | Status: DC | PRN

## 2012-01-11 MED ORDER — METHOCARBAMOL 500 MG PO TABS: 500.0000 mg | ORAL_TABLET | Freq: Four times a day (QID) | ORAL | Status: AC | PRN

## 2012-01-11 MED ORDER — HYDROMORPHONE HCL 2 MG PO TABS: 2.0000 mg | ORAL_TABLET | ORAL | Status: AC | PRN

## 2012-01-11 MED ORDER — HYDROMORPHONE HCL 2 MG PO TABS: 2.0000 mg | ORAL_TABLET | ORAL | Status: DC | PRN

## 2012-01-11 MED ORDER — RIVAROXABAN 10 MG PO TABS: 10.0000 mg | ORAL_TABLET | Freq: Every day | ORAL | Status: DC

## 2012-01-11 NOTE — Progress Notes (Signed)
Subjective: Progressing fine and should be ready for DC soon. Wound is fine,although his WBC is elevated. Will follow.   Objective: Vital signs in last 24 hours: Temp:  [97.9 F (36.6 C)-98.8 F (37.1 C)] 98.2 F (36.8 C) (02/15 0407) Pulse Rate:  [70-81] 77  (02/15 0407) Resp:  [16-18] 16  (02/15 0407) BP: (116-170)/(65-82) 116/65 mmHg (02/15 0407) SpO2:  [90 %-96 %] 96 % (02/15 0407)  Intake/Output from previous day: 02/14 0701 - 02/15 0700 In: 1540 [P.O.:480; I.V.:1060] Out: 990 [Urine:990] Intake/Output this shift:     Basename 01/11/12 0354 01/10/12 0340  HGB 12.2* 12.8*    Basename 01/11/12 0354 01/10/12 0340  WBC 19.2* 13.4*  RBC 4.01* 4.21*  HCT 36.3* 38.1*  PLT 195 190    Basename 01/11/12 0354 01/10/12 0340  NA 132* 137  K 3.8 3.5  CL 94* 99  CO2 25 28  BUN 15 10  CREATININE 1.00 0.92  GLUCOSE 169* 142*  CALCIUM 9.4 8.9   No results found for this basename: LABPT:2,INR:2 in the last 72 hours  Neurologically intact No cellulitis present  Assessment/Plan: DC tomorrow and repeat WBC   [name] A 01/11/2012, 7:36 AM

## 2012-01-11 NOTE — Progress Notes (Signed)
Physical Therapy Treatment Patient Details Name: [name] MRN: [id number] DOB: 08-01-1946 Today's Date: 01/11/2012  PT Assessment/Plan  PT - Assessment/Plan Comments on Treatment Session: Pt continues to progress well with ambulation.  Pt to D/C tomorrow.  Needs to perform stair training tomorrow before D/c PT Plan: Discharge plan remains appropriate PT Frequency: 7X/week Follow Up Recommendations: Home health PT Equipment Recommended: None recommended by PT PT Goals  Acute Rehab PT Goals PT Goal Formulation: With patient Time For Goal Achievement: 5 days Pt will go Supine/Side to Sit: with supervision PT Goal: Supine/Side to Sit - Progress: Progressing toward goal Pt will go Sit to Stand: with supervision PT Goal: Sit to Stand - Progress: Progressing toward goal Pt will go Stand to Sit: with modified independence PT Goal: Stand to Sit - Progress: Progressing toward goal Pt will Ambulate: 51 - 150 feet;with supervision;with least restrictive assistive device PT Goal: Ambulate - Progress: Progressing toward goal  PT Treatment Precautions/Restrictions  Precautions Precautions: Knee Required Braces or Orthoses: Yes Knee Immobilizer: Discontinue once straight leg raise with < 10 degree lag Restrictions Weight Bearing Restrictions: Yes RLE Weight Bearing: Weight bearing as tolerated Mobility (including Balance) Bed Mobility Bed Mobility: Yes Supine to Sit: 4: Min assist Supine to Sit Details (indicate cue type and reason): Requires assist for RLE off of bed.   Transfers Transfers: Yes Sit to Stand: 4: Min assist;From elevated surface;With upper extremity assist;From bed Sit to Stand Details (indicate cue type and reason): Min/guard for safety with cues for hand placement and RLE management.  Stand to Sit: 4: Min assist;With upper extremity assist;With armrests;To chair/3-in-1 Stand to Sit Details: Min/guard for safety with cues for controlled descent with use of UE on arm  rests and for RLE management.  Ambulation/Gait Ambulation/Gait: Yes Ambulation/Gait Assistance: 4: Min assist Ambulation/Gait Assistance Details (indicate cue type and reason): Cues provided for sequencing/technique due to pt tendency to stay too far inside RW and for upright posture.  Ambulation Distance (Feet): 80 Feet Assistive device: Rolling walker Gait Pattern: Step-to pattern;Decreased step length - left;Decreased stance time - right Gait velocity: decreased    Exercise    End of Session PT - End of Session Equipment Utilized During Treatment: Right knee immobilizer Activity Tolerance: Patient tolerated treatment well Patient left: in chair;with call bell in reach;with family/visitor present General Behavior During Session: [facility] for tasks performed Cognition: [facility] for tasks performed  [name], [name] 01/11/2012, 9:17 AM

## 2012-01-11 NOTE — Progress Notes (Signed)
  CARE MANAGEMENT NOTE 01/11/2012  Patient:  [name], [name]   Account Number:  [id number]  Date Initiated:  01/11/2012  Documentation initiated by:  Colleen Can  Subjective/Objective Assessment:   dx osteoarthritis right knee; total knee replacemnt     Action/Plan:   CM spoke with patient and spouse. Plans are for patient to return to his home in [place] where spouse will be caregiver. He already has RW  Choice of HH agencies offered. List placed in shadow chart   Anticipated DC Date:  01/12/2012   Anticipated DC Plan:  HOME W HOME HEALTH SERVICES  In-house referral  NA      DC Planning Services  CM consult      [facility] Choice  HOME HEALTH   Choice offered to / List presented to:  C-1 Patient   DME arranged  NA      DME agency  NA     HH arranged  HH-2 PT      HH agency  [facility]   Status of service:  Completed, signed off Medicare Important Message given?  NA - LOS <3 / Initial given by admissions (If response is "NO", the following Medicare IM given date fields will be blank) Date Medicare IM given:   Date Additional Medicare IM given:    Discharge Disposition:  HOME W HOME HEALTH SERVICES  Per UR Regulation:    Comments:  01/11/2012 [facility] hospital home health called requesting hh services for patient. Per Eber Jones at [facility] agency, they can accept case to provide [facility] pt services only with start date of Sunday 01/13/12

## 2012-01-11 NOTE — Progress Notes (Signed)
Physical Therapy Treatment Patient Details Name: [name] MRN: [id number] DOB: 1946-03-21 Today's Date: 01/11/2012  PT Assessment/Plan  PT - Assessment/Plan Comments on Treatment Session: Pt continues to progress well with ambulation and exercises.  Pt to DC tomorrow.   PT Plan: Discharge plan remains appropriate PT Frequency: 7X/week Follow Up Recommendations: Home health PT Equipment Recommended: None recommended by PT PT Goals  Acute Rehab PT Goals PT Goal Formulation: With patient Time For Goal Achievement: 5 days Pt will go Supine/Side to Sit: with supervision PT Goal: Supine/Side to Sit - Progress: Progressing toward goal Pt will go Sit to Supine/Side: with supervision PT Goal: Sit to Supine/Side - Progress: Progressing toward goal Pt will go Sit to Stand: with supervision PT Goal: Sit to Stand - Progress: Progressing toward goal Pt will go Stand to Sit: with modified independence PT Goal: Stand to Sit - Progress: Progressing toward goal Pt will Ambulate: 51 - 150 feet;with supervision;with least restrictive assistive device PT Goal: Ambulate - Progress: Progressing toward goal Pt will Perform Home Exercise Program: with supervision, verbal cues required/provided PT Goal: Perform Home Exercise Program - Progress: Progressing toward goal  PT Treatment Precautions/Restrictions  Precautions Precautions: Knee Required Braces or Orthoses: Yes Knee Immobilizer: Discontinue once straight leg raise with < 10 degree lag Restrictions Weight Bearing Restrictions: Yes RLE Weight Bearing: Weight bearing as tolerated Mobility (including Balance) Bed Mobility Bed Mobility: Yes Supine to Sit: 4: Min assist Supine to Sit Details (indicate cue type and reason): Requires assist for RLE off of bed.  Transfers Transfers: Yes Sit to Stand: 4: Min assist;From elevated surface;With upper extremity assist;From bed Sit to Stand Details (indicate cue type and reason): Min/guard for  safety with cues for hand placement and RLE management.  Stand to Sit: 4: Min assist;With upper extremity assist;To bed Stand to Sit Details: Min/guard for safety with cues for hand placement for controlled descent.  Ambulation/Gait Ambulation/Gait: Yes Ambulation/Gait Assistance: 4: Min assist Ambulation/Gait Assistance Details (indicate cue type and reason): Cues provided for sequencing/technique due to pt tendency to stay too far inside RW and for upright posture.  Ambulation Distance (Feet): 60 Feet Assistive device: Rolling walker Gait Pattern: Step-to pattern;Decreased step length - left;Decreased stance time - right Gait velocity: decreased    Exercise  Total Joint Exercises Ankle Circles/Pumps: AROM;Both;20 reps;Supine Quad Sets: AROM;Right;10 reps;Supine Short Arc Quad: AROM;Right;10 reps;Supine Heel Slides: AAROM;Right;10 reps;Supine Hip ABduction/ADduction: AAROM;Right;10 reps;Supine Straight Leg Raises: AAROM;Right;10 reps;Supine End of Session PT - End of Session Equipment Utilized During Treatment: Right knee immobilizer Activity Tolerance: Patient tolerated treatment well Patient left: in bed;with call bell in reach;with family/visitor present General Behavior During Session: [facility] for tasks performed Cognition: [facility] for tasks performed  [name], [name] 01/11/2012, 2:21 PM

## 2012-01-12 LAB — CBC
HCT: 30.1 % — ABNORMAL LOW (ref 39.0–52.0)
RBC: 3.31 MIL/uL — ABNORMAL LOW (ref 4.22–5.81)
RDW: 12.6 % (ref 11.5–15.5)
WBC: 17 10*3/uL — ABNORMAL HIGH (ref 4.0–10.5)

## 2012-01-12 LAB — BASIC METABOLIC PANEL
BUN: 15 mg/dL (ref 6–23)
CO2: 34 mEq/L — ABNORMAL HIGH (ref 19–32)
Calcium: 9.5 mg/dL (ref 8.4–10.5)
Chloride: 95 mEq/L — ABNORMAL LOW (ref 96–112)
Creatinine, Ser: 1.08 mg/dL (ref 0.50–1.35)
GFR calc Af Amer: 80 mL/min — ABNORMAL LOW (ref >90)
GFR calc non Af Amer: 69 mL/min — ABNORMAL LOW (ref >90)
Glucose, Bld: 162 mg/dL — ABNORMAL HIGH (ref 70–99)
Potassium: 3.2 mEq/L — ABNORMAL LOW (ref 3.5–5.1)
Sodium: 135 mEq/L (ref 135–145)

## 2012-01-12 NOTE — Progress Notes (Signed)
Cm spoke with Cassie Freer, RN concerning start of care with [facility] health for HHPT. Referral complete. D/C summary faxed to [facility] at [phone]. Agency to contact pt Monday to confirm start of care.   Erskine Speed [phone]

## 2012-01-12 NOTE — Discharge Summary (Signed)
Physician Discharge Summary   Patient ID: [name] MRN: [id number] DOB/AGE: September 29, 1946 66 y.o.  Admit date: 01/09/2012 Discharge date: 01/12/2012  Primary Diagnosis: Osteoarthritis right knee  Admission Diagnoses: Past Medical History  Diagnosis Date  . ALLERGIC RHINITIS 11/06/2007  . BENIGN PROSTATIC HYPERTROPHY 07/14/2007  . COLONIC POLYPS, HX OF 07/14/2007  . GERD 07/11/2007  . Headache 07/11/2007  . HYPERLIPIDEMIA 07/11/2007  . HYPERTENSION 07/11/2007  . HYPERTHYROIDISM 07/14/2007  . HYPOTHYROIDISM 11/06/2007  . OSTEOARTHRITIS, KNEE, RIGHT 10/27/2008  . PERIPHERAL EDEMA 05/31/2009  . PULMONARY NODULE 06/24/2008    granuloma  . WOUND, LEG 05/31/2009  . Degenerative arthritis of right knee 12/12/2011    Discharge Diagnoses:  Osteoarthritis  S/P right total knee arthroplasty Acute blood loss anemia Post-op hypokalemia   Procedure: Procedure(s) (LRB): TOTAL KNEE ARTHROPLASTY (Right)   Consults: None  HPI: The patient presented to Dr. [name] with severe right knee pain. He has had years of right knee pain due to severe degenerative osteoarthritis. Pain has been resistant to conservative treatment. Total knee arthroplasty scheduled in order to achieve increased function and decreased pain.   Laboratory Data: Hospital Outpatient Visit on 01/01/2012  Component Date Value Range Status  . aPTT (seconds) 01/01/2012 39* 24-37 Final   Comment:                                 IF BASELINE aPTT IS ELEVATED,                          SUGGEST PATIENT RISK ASSESSMENT                          BE USED TO DETERMINE APPROPRIATE                          ANTICOAGULANT THERAPY.  . WBC (K/uL) 01/01/2012 8.0  4.0-10.5 Final  . RBC (MIL/uL) 01/01/2012 4.89  4.22-5.81 Final  . Hemoglobin (g/dL) 40/98/1191 47.8  29.5-62.1 Final  . HCT (%) 01/01/2012 44.2  39.0-52.0 Final  . MCV (fL) 01/01/2012 90.4  78.0-100.0 Final  . MCH (pg) 01/01/2012 30.9  26.0-34.0 Final  . MCHC (g/dL) 30/86/5784 69.6   29.5-28.4 Final  . RDW (%) 01/01/2012 12.5  11.5-15.5 Final  . Platelets (K/uL) 01/01/2012 212  150-400 Final  . Sodium (mEq/L) 01/01/2012 140  135-145 Final  . Potassium (mEq/L) 01/01/2012 3.6  3.5-5.1 Final  . Chloride (mEq/L) 01/01/2012 99  96-112 Final  . CO2 (mEq/L) 01/01/2012 27  19-32 Final  . Glucose, Bld (mg/dL) 13/24/4010 272* 53-66 Final  . BUN (mg/dL) 44/01/4741 15  5-95 Final  . Creatinine, Ser (mg/dL) 63/87/5643 3.29  5.18-8.41 Final  . Calcium (mg/dL) 66/04/3015 9.9  0.1-09.3 Final  . Total Protein (g/dL) 23/55/7322 7.4  0.2-5.4 Final  . Albumin (g/dL) 27/04/2375 4.1  2.8-3.1 Final  . AST (U/L) 01/01/2012 23  0-37 Final  . ALT (U/L) 01/01/2012 21  0-53 Final  . Alkaline Phosphatase (U/L) 01/01/2012 72  39-117 Final  . Total Bilirubin (mg/dL) 51/76/1607 0.3  3.7-1.0 Final  . GFR calc non Af Amer (mL/min) 01/01/2012 65* >90 Final  . GFR calc Af Amer (mL/min) 01/01/2012 75* >90 Final   Comment:  The eGFR has been calculated                          using the CKD EPI equation.                          This calculation has not been                          validated in all clinical                          situations.                          eGFR's persistently                          <90 mL/min signify                          possible Chronic Kidney Disease.  Marland Kitchen Neutrophils Relative (%) 01/01/2012 65  43-77 Final  . Neutro Abs (K/uL) 01/01/2012 5.2  1.7-7.7 Final  . Lymphocytes Relative (%) 01/01/2012 24  12-46 Final  . Lymphs Abs (K/uL) 01/01/2012 2.0  0.7-4.0 Final  . Monocytes Relative (%) 01/01/2012 6  3-12 Final  . Monocytes Absolute (K/uL) 01/01/2012 0.5  0.1-1.0 Final  . Eosinophils Relative (%) 01/01/2012 4  0-5 Final  . Eosinophils Absolute (K/uL) 01/01/2012 0.3  0.0-0.7 Final  . Basophils Relative (%) 01/01/2012 0  0-1 Final  . Basophils Absolute (K/uL) 01/01/2012 0.0  0.0-0.1 Final  . Prothrombin Time (seconds) 01/01/2012  13.1  11.6-15.2 Final  . INR  01/01/2012 0.97  0.00-1.49 Final  . Color, Urine  01/01/2012 YELLOW  YELLOW Final  . APPearance  01/01/2012 CLEAR  CLEAR Final  . Specific Gravity, Urine  01/01/2012 1.015  1.005-1.030 Final  . pH  01/01/2012 6.0  5.0-8.0 Final  . Glucose, UA (mg/dL) 16/08/9603 NEGATIVE  NEGATIVE Final  . Hgb urine dipstick  01/01/2012 NEGATIVE  NEGATIVE Final  . Bilirubin Urine  01/01/2012 NEGATIVE  NEGATIVE Final  . Ketones, ur (mg/dL) 54/07/8118 NEGATIVE  NEGATIVE Final  . Protein, ur (mg/dL) 14/78/2956 NEGATIVE  NEGATIVE Final  . Urobilinogen, UA (mg/dL) 21/30/8657 0.2  8.4-6.9 Final  . Nitrite  01/01/2012 NEGATIVE  NEGATIVE Final  . Leukocytes, UA  01/01/2012 NEGATIVE  NEGATIVE Final   MICROSCOPIC NOT DONE ON URINES WITH NEGATIVE PROTEIN, BLOOD, LEUKOCYTES, NITRITE, OR GLUCOSE <1000 mg/dL.  Marland Kitchen MRSA, PCR  01/01/2012 NEGATIVE  NEGATIVE Final  . Staphylococcus aureus  01/01/2012 POSITIVE* NEGATIVE Final   Comment:                                 The Xpert SA Assay (FDA                          approved for NASAL specimens                          only), is one component of  a comprehensive surveillance                          program.  It is not intended                          to diagnose infection nor to                          guide or monitor treatment.    Basename 01/12/12 0411 01/11/12 0354 01/10/12 0340  HGB 10.2* 12.2* 12.8*    Basename 01/12/12 0411 01/11/12 0354  WBC 17.0* 19.2*  RBC 3.31* 4.01*  HCT 30.1* 36.3*  PLT 192 195    Basename 01/12/12 0411 01/11/12 0354  NA 135 132*  K 3.2* 3.8  CL 95* 94*  CO2 34* 25  BUN 15 15  CREATININE 1.08 1.00  GLUCOSE 162* 169*  CALCIUM 9.5 9.4    X-Rays:Dg Chest 2 View  01/01/2012  *RADIOLOGY REPORT*  Clinical Data: Preoperative for right total knee replacement.  CHEST - 2 VIEW  Comparison: 06/01/2009  Findings: Mild thoracic kyphosis and spondylosis noted.  Cardiac and mediastinal  contours appear normal.  The lungs appear clear.  No pleural effusion is identified.  IMPRESSION:  1.  Stable thoracic kyphosis and spondylosis.   Otherwise, no significant abnormality identified.  Original Report Authenticated By: Dellia Cloud, M.D.   X-ray Knee Right Port  01/09/2012  *RADIOLOGY REPORT*  Clinical Data: Knee pain  PORTABLE RIGHT KNEE - 1-2 VIEW  Comparison: None.  Findings: The patient is status post total knee arthroplasty. Satisfactory position and alignment.  Surgical drain good position.  IMPRESSION: As above.  Original Report Authenticated By: Elsie Stain, M.D.    EKG: Orders placed in visit on 12/12/11  . EKG 12-LEAD     Hospital Course: Patient was admitted to [facility] and taken to the OR and underwent the above state procedure without complications.  Patient tolerated the procedure well and was later transferred to the recovery room and then to the orthopaedic floor for postoperative care.  They were given PO and IV analgesics for pain control following their surgery.  They were given 24 hours of postoperative antibiotics and started on DVT prophylaxis.   PT and OT were ordered for total joint protocol.  Discharge planning consulted to help with postop disposition and equipment needs.  Patient had a good night on the evening of surgery and started to get up with therapy on day one. Hemovac drain was pulled without difficulty.  Patient has some difficulty with grogginess due to pain meds. Continued to progress with therapy into day two. Patient  Much more alert on post-op day two. Dressing was changed on day two and the incision was clean, dry, no drainage.  By day three, the patient had progressed with therapy and meeting goals.  Incision was healing well.  Patient was seen in rounds and was ready to go home.    Discharge Medications: Prior to Admission medications   Medication Sig Start Date End Date Taking? Authorizing Provider  amLODipine  (NORVASC) 5 MG tablet Take 5 mg by mouth every evening. 12/12/11 12/11/12 Yes Oliver Barre, MD  fenofibrate 160 MG tablet Take 160 mg by mouth every evening. 12/12/11 12/11/12 Yes Oliver Barre, MD  hydrochlorothiazide (HYDRODIURIL) 25 MG tablet Take 25 mg by mouth every evening. 12/12/11  Yes Fayrene Fearing  John, MD  labetalol (NORMODYNE) 200 MG tablet Take 200 mg by mouth 2 (two) times daily. 12/12/11 12/11/12 Yes Oliver Barre, MD  levothyroxine (SYNTHROID, LEVOTHROID) 50 MCG tablet Take 50 mcg by mouth every morning. 12/12/11 12/11/12 Yes Oliver Barre, MD  mupirocin cream (BACTROBAN) 2 % Apply 1 application topically 2 (two) times daily.   Yes Historical Provider, MD  pravastatin (PRAVACHOL) 20 MG tablet Take 20 mg by mouth every evening. 12/12/11 12/11/12 Yes Oliver Barre, MD  HYDROmorphone (DILAUDID) 2 MG tablet Take 1 tablet (2 mg total) by mouth every 3 (three) hours as needed (Q4-6 hours PRN). 01/11/12 01/21/12  Bond Grieshop Tamala Ser, PA  methocarbamol (ROBAXIN) 500 MG tablet Take 1 tablet (500 mg total) by mouth every 6 (six) hours as needed. 01/11/12 01/21/12  Lexington Krotz Tamala Ser, PA  rivaroxaban (XARELTO) 10 MG TABS tablet Take 1 tablet (10 mg total) by mouth daily with breakfast. 01/11/12   Karlisa Gaubert Tamala Ser, PA    Diet: heart healthy  Activity:WBAT  Follow-up:in 2 weeks  Disposition: Improved Discharged Condition: good   Discharge Orders    Future Orders Please Complete By Expires   Diet - low sodium heart healthy      Call MD / Call 911      Comments:   If you experience chest pain or shortness of breath, CALL 911 and be transported to the hospital emergency room.  If you develope a fever above 101 F, pus (white drainage) or increased drainage or redness at the wound, or calf pain, call your surgeon's office.   Constipation Prevention      Comments:   Drink plenty of fluids.  Prune juice may be helpful.  You may use a stool softener, such as Colace (over the counter) 100 mg twice a day.  Use MiraLax  (over the counter) for constipation as needed.   Increase activity slowly as tolerated      Weight Bearing as taught in Physical Therapy      Comments:   Use a walker or crutches as instructed.   Discharge instructions      Comments:   Walk with your walker. Weight bearing as instructed. Home Health Agency will follow you at home for your therapy  Change your dressing daily. Shower only, no tub bath. Call if any temperatures greater than 101 or any wound complications: [phone] during the day and ask for Dr. Jeannetta Ellis nurse, [facility] [place].   Driving restrictions      Comments:   No driving   Lifting restrictions      Comments:   No lifting   Change dressing      Comments:   Change dressing daily with sterile 4 x 4 inch gauze dressing.   Do not put a pillow under the knee. Place it under the heel.        Medication List  As of 01/12/2012 10:19 AM   STOP taking these medications         aspirin 81 MG tablet      fish oil-omega-3 fatty acids 1000 MG capsule      meloxicam 15 MG tablet         TAKE these medications         amLODipine 5 MG tablet   Commonly known as: NORVASC   Take 5 mg by mouth every evening.      fenofibrate 160 MG tablet   Take 160 mg by mouth every evening.      hydrochlorothiazide 25 MG  tablet   Commonly known as: HYDRODIURIL   Take 25 mg by mouth every evening.      HYDROmorphone 2 MG tablet   Commonly known as: DILAUDID   Take 1 tablet (2 mg total) by mouth every 3 (three) hours as needed (Q4-6 hours PRN).      labetalol 200 MG tablet   Commonly known as: NORMODYNE   Take 200 mg by mouth 2 (two) times daily.      levothyroxine 50 MCG tablet   Commonly known as: SYNTHROID, LEVOTHROID   Take 50 mcg by mouth every morning.      methocarbamol 500 MG tablet   Commonly known as: ROBAXIN   Take 1 tablet (500 mg total) by mouth every 6 (six) hours as needed.      mupirocin cream 2 %   Commonly known as: BACTROBAN   Apply 1 application  topically 2 (two) times daily.      pravastatin 20 MG tablet   Commonly known as: PRAVACHOL   Take 20 mg by mouth every evening.      rivaroxaban 10 MG Tabs tablet   Commonly known as: XARELTO   Take 1 tablet (10 mg total) by mouth daily with breakfast.             Signed: Usbaldo Pannone LAUREN 01/12/2012, 10:19 AM

## 2012-01-12 NOTE — Progress Notes (Signed)
Physical Therapy Treatment Patient Details Name: [name] MRN: [id number] DOB: 1945-12-20 Today's Date: 01/12/2012  R TKR POD #3 am session 9:30 - 9:55 1gt  1 ta PT Assessment/Plan  PT - Assessment/Plan Comments on Treatment Session: Pt has met goals to D/C to home with spouse.  Pt/spouse instructed on KI use for amb and esp to wear going up stairs to get into the house. PT Plan: Discharge plan remains appropriate Follow Up Recommendations: Home health PT Equipment Recommended: None recommended by PT;Other (comment) (Pt has all equipment from prior TKR) PT Goals  Acute Rehab PT Goals PT Goal Formulation: With patient Pt will go Supine/Side to Sit: with supervision PT Goal: Supine/Side to Sit - Progress: Met Pt will go Sit to Supine/Side: with supervision PT Goal: Sit to Supine/Side - Progress: Met Pt will go Sit to Stand: with supervision PT Goal: Sit to Stand - Progress: Met Pt will go Stand to Sit: with modified independence PT Goal: Stand to Sit - Progress: Partly met Pt will Ambulate: 51 - 150 feet;with supervision;with least restrictive assistive device PT Goal: Ambulate - Progress: Met Pt will Go Up / Down Stairs: 3-5 stairs;with min assist;with least restrictive assistive device PT Goal: Up/Down Stairs - Progress: Met Pt will Perform Home Exercise Program: with supervision, verbal cues required/provided PT Goal: Perform Home Exercise Program - Progress: Met  PT Treatment Precautions/Restrictions  Precautions Precautions: Knee Required Braces or Orthoses: Yes Knee Immobilizer: Discontinue once straight leg raise with < 10 degree lag Restrictions Weight Bearing Restrictions: Yes RLE Weight Bearing: Weight bearing as tolerated Mobility (including Balance) Bed Mobility Bed Mobility: Yes Supine to Sit: 5: Supervision Supine to Sit Details (indicate cue type and reason): MinAssist to support R LE off bed Transfers Transfers: Yes Sit to Stand: 5: Supervision;From  elevated surface;From bed Sit to Stand Details (indicate cue type and reason): increased time Stand to Sit: 5: Supervision;To chair/3-in-1 Stand to Sit Details: One VC to extend R LE prior to sit and control decend using hands Ambulation/Gait Ambulation/Gait: Yes Ambulation/Gait Assistance: 5: Supervision Ambulation/Gait Assistance Details (indicate cue type and reason): with KI and incresed time Ambulation Distance (Feet): 110 Feet Assistive device: Rolling walker Gait Pattern: Step-to pattern;Decreased stance time - right;Trunk flexed;Decreased step length - right Gait velocity: Pt c/o 6/10 pain with amb Stairs: Yes Stairs Assistance: Other (comment) (MinGuard Assist) Stair Management Technique: One rail Right;With crutches Number of Stairs: 4  Wheelchair Mobility Wheelchair Mobility: No    Exercise    End of Session PT - End of Session Equipment Utilized During Treatment: Gait belt;Right knee immobilizer Activity Tolerance: Patient tolerated treatment well Patient left: in chair;with call bell in reach;with family/visitor present Nurse Communication: Other (comment) (Pt ready for D/C to home) General Behavior During Session: [facility] for tasks performed Cognition: [facility] for tasks performed  Felecia Shelling  PTA [facility]  Acute  Rehab Pager     [phone]

## 2012-01-12 NOTE — Progress Notes (Signed)
Subjective: Ready for DC today. Doing Fine.   Objective: Vital signs in last 24 hours: Temp:  [98.3 F (36.8 C)-100.3 F (37.9 C)] 98.3 F (36.8 C) (02/16 0629) Pulse Rate:  [75-84] 78  (02/16 0629) Resp:  [16] 16  (02/16 0629) BP: (122-150)/(67-77) 122/73 mmHg (02/16 0629) SpO2:  [94 %-95 %] 95 % (02/16 0629)  Intake/Output from previous day: 02/15 0701 - 02/16 0700 In: 480 [P.O.:480] Out: 3325 [Urine:3325] Intake/Output this shift:     Basename 01/12/12 0411 01/11/12 0354 01/10/12 0340  HGB 10.2* 12.2* 12.8*    Basename 01/12/12 0411 01/11/12 0354  WBC 17.0* 19.2*  RBC 3.31* 4.01*  HCT 30.1* 36.3*  PLT 192 195    Basename 01/12/12 0411 01/11/12 0354  NA 135 132*  K 3.2* 3.8  CL 95* 94*  CO2 34* 25  BUN 15 15  CREATININE 1.08 1.00  GLUCOSE 162* 169*  CALCIUM 9.5 9.4   No results found for this basename: LABPT:2,INR:2 in the last 72 hours  Dorsiflexion/Plantar flexion intact No cellulitis present  Assessment/Plan: DC today.   [name] 01/12/2012, 8:31 AM

## 2012-01-12 NOTE — Progress Notes (Signed)
Discharged from floor via w/c, spouse with pt. No changes in assessment. [name]   

## 2012-01-28 ENCOUNTER — Encounter (HOSPITAL_COMMUNITY): Payer: Self-pay | Admitting: Orthopedic Surgery

## 2012-04-24 ENCOUNTER — Other Ambulatory Visit: Payer: Self-pay | Admitting: Internal Medicine

## 2012-05-13 ENCOUNTER — Other Ambulatory Visit: Payer: Self-pay | Admitting: Internal Medicine

## 2012-08-04 ENCOUNTER — Other Ambulatory Visit: Payer: Self-pay | Admitting: Internal Medicine

## 2012-08-22 ENCOUNTER — Ambulatory Visit (INDEPENDENT_AMBULATORY_CARE_PROVIDER_SITE_OTHER): Payer: Medicare Other | Admitting: Internal Medicine

## 2012-08-22 ENCOUNTER — Other Ambulatory Visit (INDEPENDENT_AMBULATORY_CARE_PROVIDER_SITE_OTHER): Payer: Medicare Other

## 2012-08-22 ENCOUNTER — Encounter: Payer: Self-pay | Admitting: Internal Medicine

## 2012-08-22 VITALS — BP 120/82 | HR 58 | Temp 97.1°F | Ht 72.0 in | Wt 205.5 lb

## 2012-08-22 DIAGNOSIS — Z125 Encounter for screening for malignant neoplasm of prostate: Secondary | ICD-10-CM

## 2012-08-22 DIAGNOSIS — E119 Type 2 diabetes mellitus without complications: Secondary | ICD-10-CM

## 2012-08-22 DIAGNOSIS — Z86010 Personal history of colonic polyps: Secondary | ICD-10-CM

## 2012-08-22 DIAGNOSIS — IMO0001 Reserved for inherently not codable concepts without codable children: Secondary | ICD-10-CM

## 2012-08-22 DIAGNOSIS — Z79899 Other long term (current) drug therapy: Secondary | ICD-10-CM

## 2012-08-22 DIAGNOSIS — Z0000 Encounter for general adult medical examination without abnormal findings: Secondary | ICD-10-CM

## 2012-08-22 DIAGNOSIS — D649 Anemia, unspecified: Secondary | ICD-10-CM

## 2012-08-22 DIAGNOSIS — Z23 Encounter for immunization: Secondary | ICD-10-CM

## 2012-08-22 HISTORY — DX: Type 2 diabetes mellitus without complications: E11.9

## 2012-08-22 HISTORY — DX: Reserved for inherently not codable concepts without codable children: IMO0001

## 2012-08-22 HISTORY — DX: Anemia, unspecified: D64.9

## 2012-08-22 LAB — CBC WITH DIFFERENTIAL/PLATELET
Basophils Relative: 0.4 % (ref 0.0–3.0)
Eosinophils Relative: 3.1 % (ref 0.0–5.0)
HCT: 46.8 % (ref 39.0–52.0)
Lymphs Abs: 1.7 10*3/uL (ref 0.7–4.0)
MCV: 91.1 fl (ref 78.0–100.0)
Monocytes Absolute: 0.7 10*3/uL (ref 0.1–1.0)
Neutro Abs: 6.6 10*3/uL (ref 1.4–7.7)
RBC: 5.13 Mil/uL (ref 4.22–5.81)
WBC: 9.3 10*3/uL (ref 4.5–10.5)

## 2012-08-22 LAB — BASIC METABOLIC PANEL
BUN: 20 mg/dL (ref 6–23)
Creatinine, Ser: 1.3 mg/dL (ref 0.4–1.5)
GFR: 56.26 mL/min — ABNORMAL LOW (ref >60.00)
Potassium: 4.1 mEq/L (ref 3.5–5.1)

## 2012-08-22 LAB — URINALYSIS, ROUTINE W REFLEX MICROSCOPIC
Bilirubin Urine: NEGATIVE
Ketones, ur: NEGATIVE
Nitrite: NEGATIVE
Total Protein, Urine: NEGATIVE
pH: 7 (ref 5.0–8.0)

## 2012-08-22 LAB — LIPID PANEL
Cholesterol: 192 mg/dL (ref 0–200)
HDL: 39.1 mg/dL (ref >39.00)
Total CHOL/HDL Ratio: 5
VLDL: 42.4 mg/dL — ABNORMAL HIGH (ref 0.0–40.0)

## 2012-08-22 LAB — PSA: PSA: 0.63 ng/mL (ref 0.10–4.00)

## 2012-08-22 LAB — HEPATIC FUNCTION PANEL: Total Bilirubin: 0.7 mg/dL (ref 0.3–1.2)

## 2012-08-22 MED ORDER — MELOXICAM 15 MG PO TABS: 15.0000 mg | ORAL_TABLET | Freq: Every day | ORAL | Status: DC | PRN

## 2012-08-22 MED ORDER — PRAVASTATIN SODIUM 20 MG PO TABS: 20.0000 mg | ORAL_TABLET | Freq: Every day | ORAL | Status: DC

## 2012-08-22 MED ORDER — HYDROCHLOROTHIAZIDE 25 MG PO TABS: 25.0000 mg | ORAL_TABLET | Freq: Every day | ORAL | Status: DC

## 2012-08-22 MED ORDER — AMLODIPINE BESYLATE 5 MG PO TABS: 5.0000 mg | ORAL_TABLET | Freq: Every day | ORAL | Status: DC

## 2012-08-22 MED ORDER — FENOFIBRATE 160 MG PO TABS: 160.0000 mg | ORAL_TABLET | Freq: Every day | ORAL | Status: DC

## 2012-08-22 MED ORDER — ASPIRIN 81 MG PO TBEC: 81.0000 mg | DELAYED_RELEASE_TABLET | Freq: Every day | ORAL | Status: DC

## 2012-08-22 MED ORDER — LEVOTHYROXINE SODIUM 50 MCG PO TABS: 50.0000 ug | ORAL_TABLET | ORAL | Status: DC

## 2012-08-22 MED ORDER — LABETALOL HCL 200 MG PO TABS: 200.0000 mg | ORAL_TABLET | Freq: Two times a day (BID) | ORAL | Status: DC

## 2012-08-22 NOTE — Progress Notes (Addendum)
Subjective:    Patient ID: [name], male    DOB: 1946/03/14, 66 y.o.   MRN: [id number]  HPI  Here for wellness and f/u, last seen 2010;  Overall doing ok;  Pt denies CP, worsening SOB, DOE, wheezing, orthopnea, PND, worsening LE edema, palpitations, dizziness or syncope.  Pt denies neurological change such as new Headache, facial or extremity weakness.  Pt denies polydipsia, polyuria, or low sugar symptoms. Pt states overall good compliance with treatment and medications, good tolerability, and trying to follow lower cholesterol diet.  Pt denies worsening depressive symptoms, suicidal ideation or panic. No fever, wt loss, night sweats, loss of appetite, or other constitutional symptoms.  Pt states good ability with ADL's, low to mod fall risk, home safety reviewed and adequate, no significant changes in hearing or vision, and occasionally active with exercise.  Still with abnormal ambulation, balance difficulty, fatigue since the feb 2013 right knee TKR, did fall last wk x 2 without injury.  Finished his PT post op with xarelto, now back on ASA 81.    Has 3 canes but doesn't use it.  Does have sense of ongoing fatigue, but denies signficant hypersomnolence.  Needs mult med refills  Due for flu shot next mon at his drug store per pt preference.   Past Medical History  Diagnosis Date  . ALLERGIC RHINITIS 11/06/2007  . BENIGN PROSTATIC HYPERTROPHY 07/14/2007  . COLONIC POLYPS, HX OF 07/14/2007  . GERD 07/11/2007  . Headache 07/11/2007  . HYPERLIPIDEMIA 07/11/2007  . HYPERTENSION 07/11/2007  . HYPERTHYROIDISM 07/14/2007  . HYPOTHYROIDISM 11/06/2007  . OSTEOARTHRITIS, KNEE, RIGHT 10/27/2008  . PERIPHERAL EDEMA 05/31/2009  . PULMONARY NODULE 06/24/2008    granuloma  . WOUND, LEG 05/31/2009  . Degenerative arthritis of right knee 12/12/2011  . Anemia, unspecified 08/22/2012  . Type II or unspecified type diabetes mellitus without mention of complication, uncontrolled 08/22/2012   Past Surgical History    Procedure Date  . Neck surgery   . Nissen fundoplication 2000  . Back surgery 1986    x 2  1986 C 3  . Hernia repair sept 1986  . Left knee replacement Nov 21, 2007  . Right heel bone spur 1987  . Total knee arthroplasty 01/09/2012    Procedure: TOTAL KNEE ARTHROPLASTY;  Surgeon: Jacki Cones, MD;  Location: [facility] ORS;  Service: Orthopedics;  Laterality: Right;    reports that he quit smoking about 29 years ago. His smoking use included Cigarettes. He has a 90 pack-year smoking history. He has never used smokeless tobacco. He reports that he does not drink alcohol or use illicit drugs. family history includes Heart disease in his other. Allergies  Allergen Reactions  . Adhesive (Tape) Other (See Comments)    Rash, reddness   . Codeine Other (See Comments)    Headaches   . Naproxen Diarrhea  . Prednisone Diarrhea  . Zolpidem Tartrate Other (See Comments)    hallucinations  . Penicillins Rash   Current Outpatient Prescriptions on File Prior to Visit  Medication Sig Dispense Refill  . amLODipine (NORVASC) 5 MG tablet Take 1 tablet (5 mg total) by mouth daily.  90 tablet  3  . fenofibrate 160 MG tablet Take 1 tablet (160 mg total) by mouth daily.  90 tablet  3  . hydrochlorothiazide (HYDRODIURIL) 25 MG tablet Take 1 tablet (25 mg total) by mouth daily.  90 tablet  3  . labetalol (NORMODYNE) 200 MG tablet Take 1 tablet (200 mg total) by  mouth 2 (two) times daily.  180 tablet  3  . levothyroxine (SYNTHROID, LEVOTHROID) 50 MCG tablet Take 1 tablet (50 mcg total) by mouth every morning.  90 tablet  3  . pravastatin (PRAVACHOL) 20 MG tablet Take 1 tablet (20 mg total) by mouth daily.  90 tablet  3   Current Outpatient Prescriptions on File Prior to Visit  Medication Sig Dispense Refill  . amLODipine (NORVASC) 5 MG tablet Take 1 tablet (5 mg total) by mouth daily.  90 tablet  3  . fenofibrate 160 MG tablet Take 1 tablet (160 mg total) by mouth daily.  90 tablet  3  . hydrochlorothiazide  (HYDRODIURIL) 25 MG tablet Take 1 tablet (25 mg total) by mouth daily.  90 tablet  3  . labetalol (NORMODYNE) 200 MG tablet Take 1 tablet (200 mg total) by mouth 2 (two) times daily.  180 tablet  3  . levothyroxine (SYNTHROID, LEVOTHROID) 50 MCG tablet Take 1 tablet (50 mcg total) by mouth every morning.  90 tablet  3  . pravastatin (PRAVACHOL) 20 MG tablet Take 1 tablet (20 mg total) by mouth daily.  90 tablet  3    Review of Systems Review of Systems  Constitutional: Negative for diaphoresis, activity change, appetite change and unexpected weight change.  HENT: Negative for hearing loss, ear pain, facial swelling, mouth sores and neck stiffness.   Eyes: Negative for pain, redness and visual disturbance.  Respiratory: Negative for shortness of breath and wheezing.   Cardiovascular: Negative for chest pain and palpitations.  Gastrointestinal: Negative for diarrhea, blood in stool, abdominal distention and rectal pain.  Genitourinary: Negative for hematuria, flank pain and decreased urine volume.  Musculoskeletal: Negative for myalgias and joint swelling.  Skin: Negative for color change and wound.  Neurological: Negative for syncope and numbness.  Hematological: Negative for adenopathy.  Psychiatric/Behavioral: Negative for hallucinations, self-injury, decreased concentration and agitation.      Objective:   Physical Exam BP 120/82  Pulse 58  Temp 97.1 F (36.2 C) (Oral)  Ht 6' (1.829 m)  Wt 205 lb 8 oz (93.214 kg)  BMI 27.87 kg/m2  SpO2 96% Physical Exam  VS noted Constitutional: Pt appears well-developed and well-nourished.  HENT: Head: Normocephalic.  Right Ear: External ear normal.  Left Ear: External ear normal.  Eyes: Conjunctivae and EOM are normal. Pupils are equal, round, and reactive to light.  Neck: Normal range of motion. Neck supple.  Cardiovascular: Normal rate and regular rhythm.   Pulmonary/Chest: Effort normal and breath sounds normal.  Abd:  Soft, NT,  non-distended, + BS Neurological: Pt is alert. Not confused , motor/dtr intact, gait somewhat unsteady, favors rle Skin: Skin is warm. No erythema.  Psychiatric: Pt behavior is normal. Thought content normal. 1+ nervous    Assessment & Plan:

## 2012-08-22 NOTE — Patient Instructions (Addendum)
Continue all other medications as before Your refills were done today to the pharmacy Please have the pharmacy call with any other refills you may need. Remember to get the flu shot Monday as you have planned Please go to LAB in the Basement for the blood and/or urine tests to be done today You will be contacted by phone if any changes need to be made immediately.  Otherwise, you will receive a letter about your results with an explanation. Please remember to sign up for My Chart at your earliest convenience, as this will be important to you in the future with finding out test results. You had the pneumonia shot today You will be contacted regarding the referral for: colonoscopy Please walk with your cane at all times Please keep your appointments with your specialists as you have planned - Dr [name] in November 2013 If the balance problem becomes worse, we may need to refer to Neurology Please return in 6 mo with Lab testing done 3-5 days before

## 2012-08-22 NOTE — Assessment & Plan Note (Signed)

## 2012-08-23 ENCOUNTER — Encounter: Payer: Self-pay | Admitting: Internal Medicine

## 2012-08-23 NOTE — Assessment & Plan Note (Signed)
stable overall by hx and exam, most recent data reviewed with pt, and pt to continue medical treatment as before Lab Results  Component Value Date   HGBA1C 7.2* 03/19/2011

## 2012-08-23 NOTE — Progress Notes (Deleted)
  Subjective:    Patient ID: [name], male    DOB: [birth year], 66 y.o.   MRN: [id number]  HPI    Review of Systems     Objective:   Physical Exam        Assessment & Plan:

## 2012-08-23 NOTE — Assessment & Plan Note (Signed)
For colonoscpy as he is due

## 2012-08-23 NOTE — Assessment & Plan Note (Signed)
stable overall by hx and exam, most recent data reviewed with pt, and pt to continue medical treatment as before Lab Results  Component Value Date   WBC 9.3 08/22/2012   HGB 15.7 08/22/2012   HCT 46.8 08/22/2012   MCV 91.1 08/22/2012   PLT 238.0 08/22/2012

## 2012-08-25 ENCOUNTER — Encounter: Payer: Self-pay | Admitting: Internal Medicine

## 2012-11-03 ENCOUNTER — Ambulatory Visit (INDEPENDENT_AMBULATORY_CARE_PROVIDER_SITE_OTHER): Payer: Medicare Other | Admitting: Internal Medicine

## 2012-11-03 ENCOUNTER — Encounter: Payer: Self-pay | Admitting: Internal Medicine

## 2012-11-03 VITALS — BP 128/90 | HR 51 | Temp 98.0°F | Ht 72.0 in | Wt 206.0 lb

## 2012-11-03 DIAGNOSIS — R4781 Slurred speech: Secondary | ICD-10-CM

## 2012-11-03 DIAGNOSIS — I10 Essential (primary) hypertension: Secondary | ICD-10-CM

## 2012-11-03 DIAGNOSIS — R2689 Other abnormalities of gait and mobility: Secondary | ICD-10-CM

## 2012-11-03 DIAGNOSIS — M542 Cervicalgia: Secondary | ICD-10-CM

## 2012-11-03 DIAGNOSIS — M6281 Muscle weakness (generalized): Secondary | ICD-10-CM

## 2012-11-03 DIAGNOSIS — IMO0001 Reserved for inherently not codable concepts without codable children: Secondary | ICD-10-CM

## 2012-11-03 DIAGNOSIS — R4789 Other speech disturbances: Secondary | ICD-10-CM

## 2012-11-03 DIAGNOSIS — R29818 Other symptoms and signs involving the nervous system: Secondary | ICD-10-CM

## 2012-11-03 DIAGNOSIS — R531 Weakness: Secondary | ICD-10-CM

## 2012-11-03 MED ORDER — ASPIRIN EC 325 MG PO TBEC: 325.0000 mg | DELAYED_RELEASE_TABLET | Freq: Every day | ORAL | Status: DC

## 2012-11-03 NOTE — Patient Instructions (Addendum)
There seems to be evidence of recent onset stroke like weakness though we cant be 100% sure if the left weakness, and the right neck pain might be related to a neck problem Please change the Aspirin to 325 mg per day You will be contacted regarding the referral for: MRI for the head You will be contacted regarding the referral for: Neurology (who will likely consider neck MRI if needed) Continue all other medications as before Thank you for enrolling in MyChart. Please follow the instructions below to securely access your online medical record. MyChart allows you to send messages to your doctor, view your test results, renew your prescriptions, schedule appointments, and more.

## 2012-11-03 NOTE — Assessment & Plan Note (Signed)
stable overall by hx and exam, most recent data reviewed with pt, and pt to continue medical treatment as before BP Readings from Last 3 Encounters:  11/03/12 128/90  08/22/12 120/82  01/12/12 122/73

## 2012-11-03 NOTE — Assessment & Plan Note (Addendum)
Weakness with change in speech, off balance, left hyperreflexia - ? cva July 2013? - to increase the asa to 325 mg per day, check MRI head, refer neuro, consider c-spine MRI to r/o spinal stenosis as cause  Note:  Total time for pt hx, exam, review of record with pt in the room, determination of diagnoses and plan for further eval and tx is > 40 min, with over 50% spent in coordination and counseling of patient

## 2012-11-03 NOTE — Progress Notes (Signed)
Subjective:    Patient ID: [name], male    DOB: June 17, 1946, 66 y.o.   MRN: [id number]  HPI  66yo WM, left handed, Here with ongoing symptoms of left sided weakness, off balance with ambulation and speech somewhat changed, started July 2013, wife now convinced him to seek attention as she has had several small strokes;  Pt denies chest pain, increased sob or doe, wheezing, orthopnea, PND, increased LE swelling, palpitations, dizziness or syncope.  Pt denies new neurological symptoms such as new headache, or facial or extremity weakness or numbness  Except for the above.  Has had significant and persistent right neck pain with radiation to the right upper back and shoulder but no more distal RUE weakness/numb/pain.  No other back or spine pain. And no bowel or bladder change, fever, wt loss,  worsening LE pain or falls. Past Medical History  Diagnosis Date  . ALLERGIC RHINITIS 11/06/2007  . BENIGN PROSTATIC HYPERTROPHY 07/14/2007  . COLONIC POLYPS, HX OF 07/14/2007  . GERD 07/11/2007  . Headache 07/11/2007  . HYPERLIPIDEMIA 07/11/2007  . HYPERTENSION 07/11/2007  . HYPERTHYROIDISM 07/14/2007  . HYPOTHYROIDISM 11/06/2007  . OSTEOARTHRITIS, KNEE, RIGHT 10/27/2008  . PERIPHERAL EDEMA 05/31/2009  . PULMONARY NODULE 06/24/2008    granuloma  . WOUND, LEG 05/31/2009  . Degenerative arthritis of right knee 12/12/2011  . Anemia, unspecified 08/22/2012  . Type II or unspecified type diabetes mellitus without mention of complication, uncontrolled 08/22/2012   Past Surgical History  Procedure Date  . Neck surgery   . Nissen fundoplication 2000  . Back surgery 1986    x 2  1986 C 3  . Hernia repair sept 1986  . Left knee replacement Nov 21, 2007  . Right heel bone spur 1987  . Total knee arthroplasty 01/09/2012    Procedure: TOTAL KNEE ARTHROPLASTY;  Surgeon: Jacki Cones, MD;  Location: [facility];  Service: Orthopedics;  Laterality: Right;    reports that he quit smoking about 29 years ago. His smoking  use included Cigarettes. He has a 90 pack-year smoking history. He has never used smokeless tobacco. He reports that he does not drink alcohol or use illicit drugs. family history includes Heart disease in his other. Allergies  Allergen Reactions  . Adhesive (Tape) Other (See Comments)    Rash, reddness   . Codeine Other (See Comments)    Headaches   . Naproxen Diarrhea  . Prednisone Diarrhea  . Zolpidem Tartrate Other (See Comments)    hallucinations  . Penicillins Rash   Current Outpatient Prescriptions on File Prior to Visit  Medication Sig Dispense Refill  . amLODipine (NORVASC) 5 MG tablet Take 1 tablet (5 mg total) by mouth daily.  90 tablet  3  . fenofibrate 160 MG tablet Take 1 tablet (160 mg total) by mouth daily.  90 tablet  3  . hydrochlorothiazide (HYDRODIURIL) 25 MG tablet Take 1 tablet (25 mg total) by mouth daily.  90 tablet  3  . labetalol (NORMODYNE) 200 MG tablet Take 1 tablet (200 mg total) by mouth 2 (two) times daily.  180 tablet  3  . levothyroxine (SYNTHROID, LEVOTHROID) 50 MCG tablet Take 1 tablet (50 mcg total) by mouth every morning.  90 tablet  3  . meloxicam (MOBIC) 15 MG tablet Take 1 tablet (15 mg total) by mouth daily as needed for pain.  90 tablet  3  . pravastatin (PRAVACHOL) 20 MG tablet Take 1 tablet (20 mg total) by mouth daily.  90  tablet  3    Review of Systems  Constitutional: Negative for diaphoresis and unexpected weight change.  HENT: Negative for tinnitus.   Eyes: Negative for photophobia and visual disturbance.  Respiratory: Negative for choking and stridor.   Gastrointestinal: Negative for vomiting and blood in stool.  Genitourinary: Negative for hematuria and decreased urine volume.  Musculoskeletal: Negative for gait problem.  Skin: Negative for color change and wound.  Neurological: Negative for tremors and numbness.  Psychiatric/Behavioral: Negative for decreased concentration. The patient is not hyperactive.       Objective:    Physical Exam BP 128/90  Pulse 51  Temp 98 F (36.7 C) (Oral)  Ht 6' (1.829 m)  Wt 206 lb (93.441 kg)  BMI 27.94 kg/m2  SpO2 97% Physical Exam  VS noted Constitutional: Pt appears well-developed and well-nourished.  HENT: Head: Normocephalic.  Right Ear: External ear normal.  Left Ear: External ear normal.  Eyes: Conjunctivae and EOM are normal. Pupils are equal, round, and reactive to light.  Neck: Normal range of motion. Neck supple.  Cardiovascular: Normal rate and regular rhythm.   Pulmonary/Chest: Effort normal and breath sounds normal.  Abd:  Soft, NT, non-distended, + BS Neurological: Pt is alert. Not confused , cn 2-12 intact - no facial droop and I cant tell slurred speech, motor - LUE and LLE 4+/5 strength, with mild left patellar hyperreflexia Spine nontender througout Right shoulder FROM, NT Skin: Skin is warm. No erythema.  Psychiatric: Pt behavior is normal. Thought content normal.     Assessment & Plan:

## 2012-11-03 NOTE — Assessment & Plan Note (Signed)
stable overall by hx and exam, most recent data reviewed with pt, and pt to continue medical treatment as before Lab Results  Component Value Date   HGBA1C 6.3 08/22/2012

## 2012-11-07 ENCOUNTER — Ambulatory Visit
Admission: RE | Admit: 2012-11-07 | Discharge: 2012-11-07 | Disposition: A | Discharge status: Home / Self Care | Payer: Medicare Other | Source: Ambulatory Visit | Attending: Internal Medicine | Admitting: Internal Medicine

## 2012-11-07 DIAGNOSIS — R531 Weakness: Secondary | ICD-10-CM

## 2012-11-08 ENCOUNTER — Other Ambulatory Visit: Payer: Self-pay | Admitting: Internal Medicine

## 2012-11-08 DIAGNOSIS — R531 Weakness: Secondary | ICD-10-CM

## 2012-11-08 DIAGNOSIS — M542 Cervicalgia: Secondary | ICD-10-CM

## 2012-11-11 ENCOUNTER — Other Ambulatory Visit: Payer: Self-pay | Admitting: Internal Medicine

## 2012-11-11 ENCOUNTER — Ambulatory Visit
Admission: RE | Admit: 2012-11-11 | Discharge: 2012-11-11 | Disposition: A | Discharge status: Home / Self Care | Payer: Medicare Other | Source: Ambulatory Visit | Attending: Internal Medicine | Admitting: Internal Medicine

## 2012-11-11 DIAGNOSIS — M4712 Other spondylosis with myelopathy, cervical region: Secondary | ICD-10-CM

## 2012-11-11 DIAGNOSIS — R531 Weakness: Secondary | ICD-10-CM

## 2012-11-11 DIAGNOSIS — M542 Cervicalgia: Secondary | ICD-10-CM

## 2012-11-11 HISTORY — DX: Other spondylosis with myelopathy, cervical region: M47.12

## 2012-11-12 ENCOUNTER — Encounter: Payer: Self-pay | Admitting: Internal Medicine

## 2012-12-04 ENCOUNTER — Other Ambulatory Visit: Payer: Self-pay | Admitting: Internal Medicine

## 2012-12-07 IMAGING — CR DG KNEE 1-2V PORT*R*
1 series · 2 of 2 positions shown · non-contrast
Comparison: None.

CLINICAL DATA: Knee pain

PORTABLE RIGHT KNEE - 1-2 VIEW

[Series 1: AP · right · 2 of 2 slices shown]
[im 1/2]
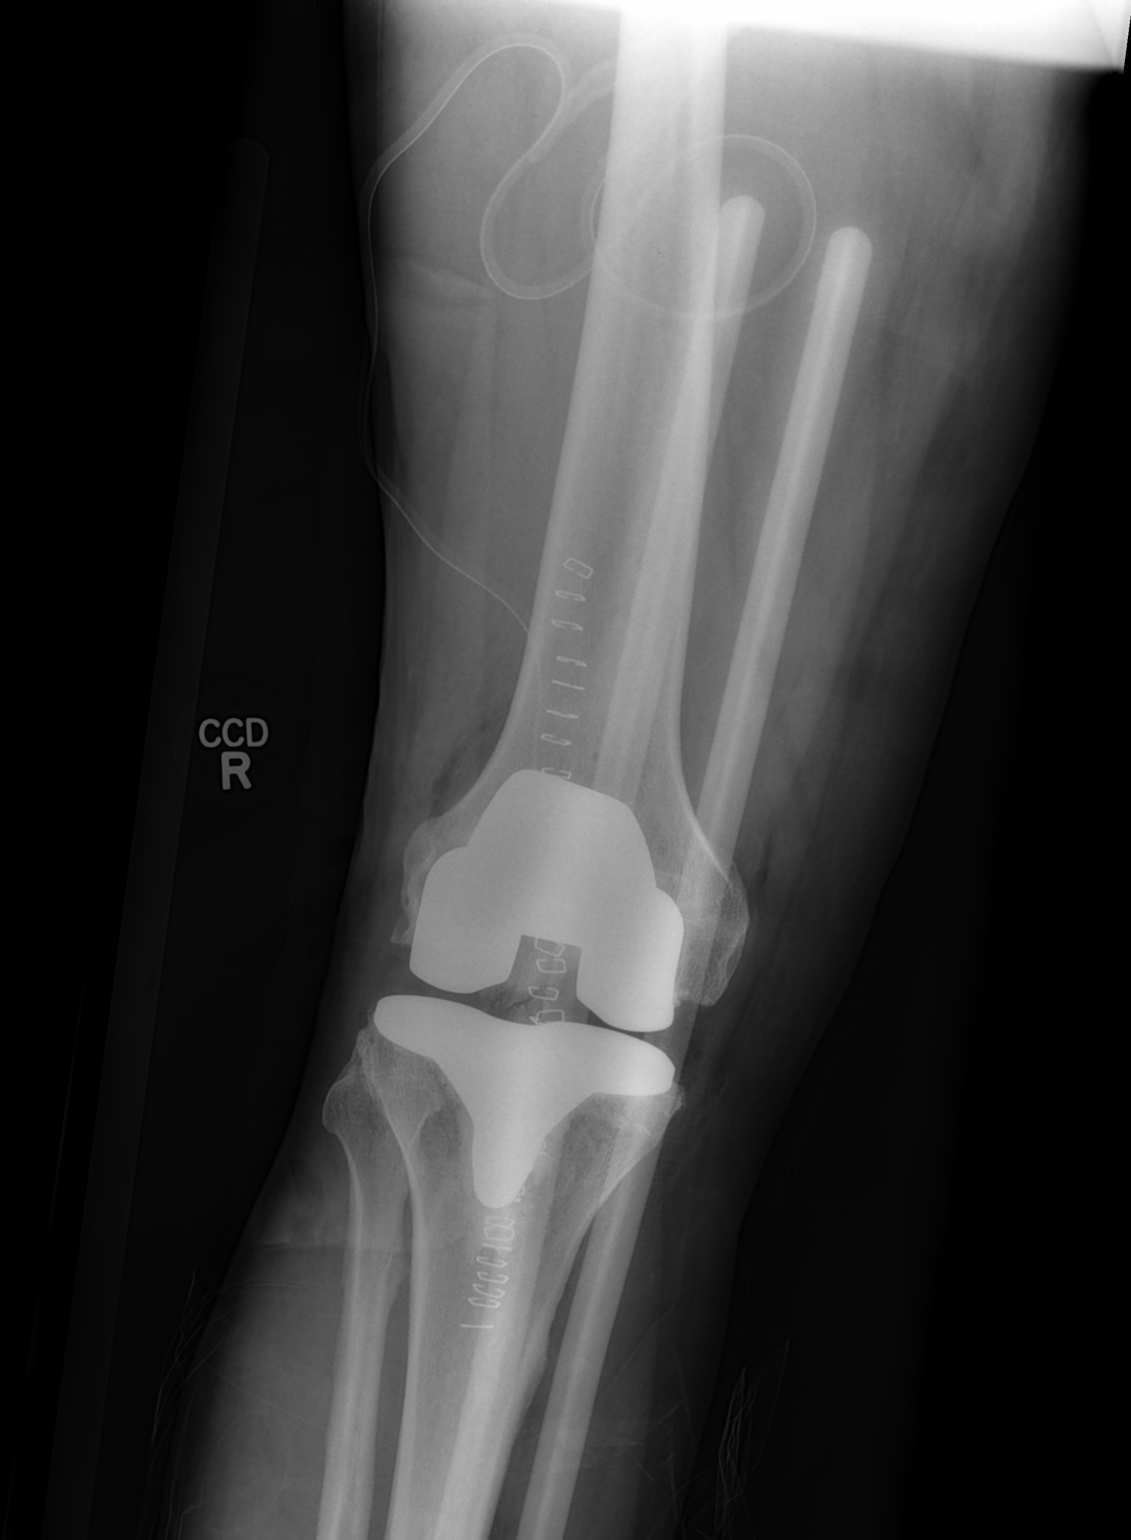
[im 2/2]
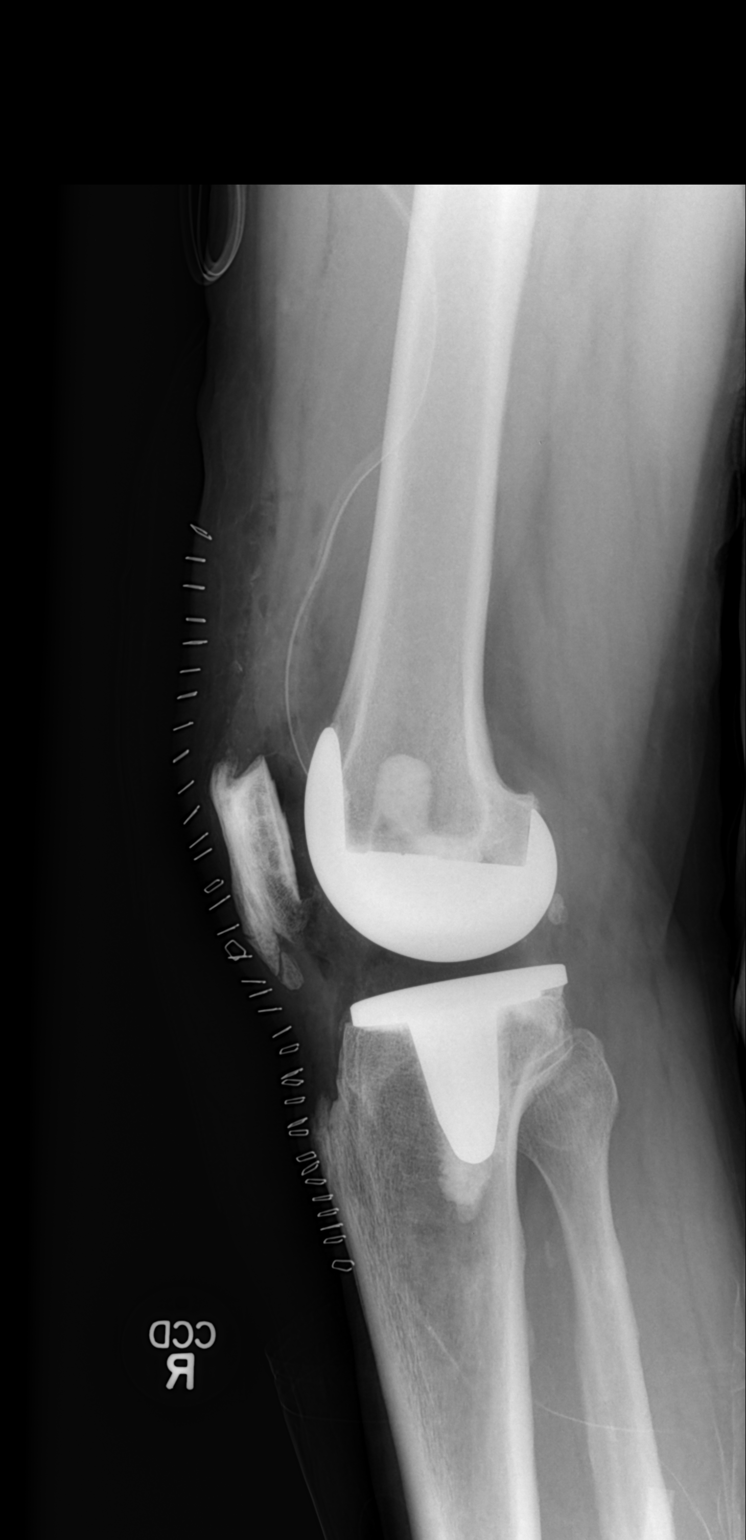

[2 of 2 positions shown; findings below may reference images not displayed]

FINDINGS: The patient is status post total knee arthroplasty.
Satisfactory position and alignment.  Surgical drain good position.
IMPRESSION: As above.

## 2012-12-24 ENCOUNTER — Other Ambulatory Visit: Payer: Self-pay | Admitting: Neurosurgery

## 2012-12-24 ENCOUNTER — Encounter (HOSPITAL_COMMUNITY): Payer: Self-pay

## 2012-12-31 ENCOUNTER — Encounter (HOSPITAL_COMMUNITY): Payer: Self-pay

## 2012-12-31 ENCOUNTER — Ambulatory Visit (HOSPITAL_COMMUNITY)
Admission: RE | Admit: 2012-12-31 | Discharge: 2012-12-31 | Disposition: A | Discharge status: Home / Self Care | Payer: Medicare Other | Source: Ambulatory Visit | Attending: Anesthesiology | Admitting: Anesthesiology

## 2012-12-31 ENCOUNTER — Encounter (HOSPITAL_COMMUNITY)
Admission: RE | Admit: 2012-12-31 | Discharge: 2012-12-31 | Disposition: A | Discharge status: Home / Self Care | Payer: Medicare Other | Source: Ambulatory Visit | Attending: Neurosurgery | Admitting: Neurosurgery

## 2012-12-31 LAB — SURGICAL PCR SCREEN
MRSA, PCR: NEGATIVE
Staphylococcus aureus: NEGATIVE

## 2012-12-31 LAB — CBC
HCT: 44.7 % (ref 39.0–52.0)
Hemoglobin: 15.5 g/dL (ref 13.0–17.0)
MCH: 31.2 pg (ref 26.0–34.0)
RBC: 4.97 MIL/uL (ref 4.22–5.81)

## 2012-12-31 LAB — BASIC METABOLIC PANEL
Chloride: 103 mEq/L (ref 96–112)
GFR calc non Af Amer: 67 mL/min — ABNORMAL LOW (ref >90)
Glucose, Bld: 138 mg/dL — ABNORMAL HIGH (ref 70–99)
Potassium: 3.7 mEq/L (ref 3.5–5.1)
Sodium: 142 mEq/L (ref 135–145)

## 2012-12-31 NOTE — Progress Notes (Signed)
Abnormal EKG noted @PAT (Anterior Infarct).  No previous EKG in EPIC.  Faxed a request for recent EKG & OV note to family MD: Dr. Fayrene Fearing [phone]; [phone]).

## 2012-12-31 NOTE — Progress Notes (Addendum)
12/31/12 1357  OBSTRUCTIVE SLEEP APNEA  Have you ever been diagnosed with sleep apnea through a sleep study? No  Do you snore loudly (loud enough to be heard through closed doors)?  0  Do you often feel tired, fatigued, or sleepy during the daytime? 1  Has anyone observed you stop breathing during your sleep? 0  Do you have, or are you being treated for high blood pressure? 1  BMI more than 35 kg/m2? 0  Age over 69 years old? 1  Neck circumference greater than 40 cm/18 inches? 0  Gender: 1  Obstructive Sleep Apnea Score 4   Score 4 or greater  Results sent to PCP

## 2012-12-31 NOTE — Progress Notes (Signed)
Pt here for PAT.  Sleep Study/Apnea: denies having sleep study.  Scored 4 using STOP-BANG tool-note routed to providers. Echo: Denies Stress: 12/20/2011(EPIC) Heart Cath: Denies  Family MD: Dr. Floyde Parkins Pulmonologist: Dr. Sandrea Hughs

## 2012-12-31 NOTE — Pre-Procedure Instructions (Signed)
ELZIE KNISLEY  12/31/2012   Your procedure is scheduled on:  Tuesday, 01/06/2013@7 :30AM.  Report to [facility] at 5:30 AM.  Call this number if you have problems the morning of surgery: [phone]   Remember:   Do not eat food or drink liquids after midnight.   Take these medicines the morning of surgery with A SIP OF WATER: Amlodipine( Norvasc), Cetirizine(Zyrtec), Labetolol( Normodyne), Levothyroxine( Synthroid).                       Discontinue aspirin, Coumadin, Plavix, Effient, and herbal medications      Do not wear jewelry, make-up or nail polish.  Do not wear lotions, powders, or perfumes. You may wear deodorant.  Do not shave 48 hours prior to surgery. Men may shave face and neck.  Do not bring valuables to the hospital.  Contacts, dentures or bridgework may not be worn into surgery.  Leave suitcase in the car. After surgery it may be brought to your room.  For patients admitted to the hospital, checkout time is 11:00 AM the day of  discharge.   Patients discharged the day of surgery will not be allowed to drive  home.  Name and phone number of your driver: Cornellius Kropp, wife  Special Instructions: Shower using CHG 2 nights before surgery and the night before surgery.  If you shower the day of surgery use CHG.  Use special wash - you have one bottle of CHG for all showers.  You should use approximately 1/3 of the bottle for each shower.   Please read over the following fact sheets that you were given: Pain Booklet, Coughing and Deep Breathing and Surgical Site Infection Prevention

## 2013-01-02 NOTE — Consult Note (Signed)
Anesthesia Chart Review:  Patient is a 67 year old male scheduled for C3-4 ACDF by Dr. Venetia Maxon on 01/06/13.  History includes right TKA 01/09/12, left TKA, prior neck and back surgeries, Nissen fundoplication, BPH, HLD, hypothyroidism, DM2, HTN, GERD, anemia, RUL pulmonary nodule stable by serial CT scans with no further f/u recommended in 2010 (Dr. Sherene Sires). OSA screening score was 4.  PCP is Dr. Oliver Barre.    Nuclear stress test on 12/20/11 showed: Normal stress nuclear study. There is a mild fixed inferior perfusion defect with normal wall motion and EF 58%. I suspect that this is diaphragmatic artifact (Dr. Shirlee Latch).     EKG on 12/31/12 showed NSR, cannot rule out anteroseptal infarct (age undetermined, non-specific lateral T wave abnormality.  EKG on 11/13/07 showed NSR, but his previous EKG from 12/12/11 showed SB, incomplete left BBB, poor r wave progression, non-specific ST/T wave abnormality.  There were no acute findings on CXR from 12/31/12.  Preoperative labs noted.    Patient had a low risk stress test in January 2013.  He has since tolerated TKA.  If no significant change in his status then would anticipate he could proceed as planned.  Shonna Chock, PA-C 01/02/13 1133

## 2013-01-05 MED ORDER — VANCOMYCIN HCL IN DEXTROSE 1-5 GM/200ML-% IV SOLN
1000.0000 mg | INTRAVENOUS | Status: AC
  Administered 2013-01-06: 1000 mg via INTRAVENOUS
  Filled 2013-01-05: qty 200

## 2013-01-05 NOTE — H&P (Signed)
NEUROSURGICAL CONSULTATION  [name]. Willow  #[id number] DOB:  [birth year]  December 22, 2012  HISTORY:     [name] is a 67 year old retired man with neck, right upper extremity and right scapular pain.  He notes numbness in his right arm, weakness in his right arm and leg, which has been going on since October or November and says that he has to sleep in a recliner due to pain.  He uses a cane which is very unsteady.  He describes pain radiating to his shoulder, to the right elbow and he describes it as "spiders in his neck".  He says his left side is weak.  He has poor balance and this developed after 05/28/2012.  He was felt initially to have had a stroke but evaluation of his neck demonstrated that he had significant cervical instability and also cord compression.  He did not have evidence of a stroke.  Plain radiographs of the cervical spine show subluxation of C3 on 4, neutral lateral radiograph shows 4.8 mm increasing to 7 mm on flexion, decreasing to 4.2 mm on extension.  He has had an MRI of his cervical spine which appears to show forward compression at the C3-4 level with significant stenosis at this level.    He has been taking aspirin which was changed to Plavix by Dr. Pearlean Brownie in December.  He takes over-the-counter Ibuprofen.  He uses a cane when he walks.  He says he is very unsteady.    He is currently complaining of right-sided arm pain.  His MRI does show foraminal stenosis at C5-6 on the right.  He appears to have multiple levels of degenerative changes on the Cervical MRI involving C5-6 to a lesser degree C6-7 and C4-5 levels.  The only level with pronounced stenosis and cord compression is at the C3-4 level.    REVIEW OF SYSTEMS:   A detailed Review of Systems sheet was reviewed with the patient.  Pertinent positives include:  Eyes-glasses; Ear, Nose, Mouth and Throat-balance disturbance; Cardiovascular-high blood pressure, high cholesterol, leg pain with walking; Musculoskeletal-Left  arm weakness, left leg weakness, back pain, right arm pain, arthritis and neck pain;  Endocrine-thyroid disease.  All other systems are negative; this includes Constitutional symptoms, Respiratory, Gastrointestinal, Genitourinary, Integumentary & Breast, Neurologic, Psychiatric, Hematologic/Lymphatic, Allergic/Immunologic.    PAST MEDICAL HISTORY:       Current Medical Conditions:    As previously described.  Additional medical problems include hypertension, gastroesophageal reflux disease, cerebrovascular accident which has been worked up and treated by Dr. Pearlean Brownie, hypothyroidism and elevated cholesterol.     Prior Operations and Hospitalizations:   Significant for stomach surgery in 2000, left total knee replacement in 2008, right total knee replacement in 2013, tonsillectomy 1955, bone spurts removed in 1987, oral surgery in 1986, surgery in the upper back 12/1984 and neck by Dr. Hope Pigeon and low back in 1986.      Medications and Allergies:  CoQ-10 200 mg. q.d., Clopidogrel 75 mg. q.d., Fenofibrate 160 mg. q.d., Labetalol 100 mg. b.i.d., Levothyroxine 50 mg. q.d., Hydrochlorothiazide 25 mg. q.d., Meloxicam 15 mg. q.d., Amlodipine 5 mg. q.d., Fish oil 1500 mg. b.i.d., Cetirizine 10 mg. q.d.  Allergies include Penicillin-rash, Codeine-headaches, Prednisone-loose bowels and surgical tape-takes skin off.    Height and Weight:     6' tall, 206 pounds.  BMI is 28.0.        SOCIAL HISTORY:    He denies tobacco, alcohol or drug use.      DIAGNOSTIC STUDIES:  As previously described.      PHYSICAL EXAMINATION:      General Appearance:   [name] is a pleasant and cooperative man in no acute distress.        Blood Pressure, Pulse:     146/96, heart rate 76 and regular, respirations 16.     HEENT - normocephalic, atraumatic.  The pupils are equal, round and reactive to light.  The extraocular muscles are intact.  Sclerae - white.  Conjunctiva - pink.  Oropharynx benign.  Uvula midline.     Neck -  Spurlings' test is negative without reproducible radicular pain turning the patient's head to either side.  Negative Lhermitte's sign with axial compression.      Respiratory - there is normal respiratory effort with good intercostal function.  Lungs are clear to auscultation.  There are no rales, rhonchi or wheezes.      Cardiovascular - the heart has regular rate and rhythm to auscultation.  No murmurs are appreciated.  There is no extremity edema, cyanosis or clubbing.  There are palpable pedal pulses.        Abdomen - soft, nontender, no hepatosplenomegaly appreciated or masses.  There are active bowel sounds.  No guarding or rebound.      Musculoskeletal Examination - he does have right trapezius atrophy.    NEUROLOGICAL EXAMINATION: The patient is oriented to time, person and place and has good recall of both recent and remote memory with normal attention span and concentration.  The patient speaks with clear and fluent speech and exhibits normal language function and appropriate fund of knowledge.      Cranial Nerve Examination - pupils are equal, round and reactive to light.  Extraocular movements are full.  Visual fields are full to confrontational testing.  Facial sensation and facial movement are symmetric and intact.  Hearing is intact to finger rub.  Palate is upgoing.  Shoulder shrug is symmetric.  Tongue protrudes in the midline.      Motor Examination - motor strength is 5/5 in the bilateral deltoids, biceps, triceps, handgrips, wrist extensors, interosseous.  In the lower extremities motor strength is 5/5 in hip flexion, extension, quadriceps, hamstrings, plantar flexion, dorsiflexion and extensor hallucis longus.      Sensory Examination - normal to light touch and pinprick sensation in the upper and lower extremities.     Deep Tendon Reflexes - brisk throughout with 3+ at the biceps, 3 at the triceps, positive Hoffmann's signs bilaterally, 3+ at the knees and 3 at the ankles.   Great toes are downgoing to stimulation.   IMPRESSION AND RECOMMENDATIONS: [name] is a 67 year old man who has what was reportedly mild cervical stenosis at C3-4; however he has subluxation of C3 on 4 to a significant degree and I believe he has compression as a result of this.  He is hyperreflexic with positive Hoffmann's signs which is clearly different that he was previously when Dr. Pearlean Brownie evaluated him.  While he has multilevel spondylosis in the cervical spine and has had previous cervical surgery, he does not appear to have any other level where he has compression and I think the C3-4 level has mobile spondylolisthesis on flexion and extension and associated with canal stenosis and cord compression and hyperreflexia and should be treated. I recommended that he undergo anterior cervical decompression and fusion C3-4 level.  He was fitted for a Vista collar.  He will be off Plavix for 10 days prior to surgery.  Surgery will be  scheduled for 01/06/2013.  Risks and benefits were discussed with the patient in detail who wishes to proceed.  I have recommended to the patient that he undergo anterior cervical discectomy and fusion with plating.  I went over the diagnostic studies in detail and reviewed surgical models and also discussed the exact nature of the surgical procedure, attendant risks, potential benefits and typical operative and postoperative course.  I discussed the risks of surgery which include, but are not limited to, risks of anesthesia, blood loss, infection, injury to various neck structures including the trachea, esophagus, which could cause temporary or permanent swallowing difficulties and also the potential for perforation of the esophagus which might require operative intervention, larynx, recurrent laryngeal nerve, which could cause either temporary or permanent vocal cord paralysis resulting in either temporary or permanent voice changes, injury to cervical nerve roots, which could  cause either temporary or permanent arm pain, numbness and/or weakness.  There is a small chance of injury to the spinal cord which could cause paralysis.  There is also the potential for malplacement of instrumentation, fusion failure, need for repeat surgery, degenerative disease at other levels in the neck, failure to relieve the pain, worsening of pain.  I also discussed with the patient that he will lose some neck mobility from the surgery.  It is typical to stay in the hospital overnight after this operation.  Typically he will not be able to drive for two weeks after surgery and will come back to see me two weeks following surgery with a lateral C-spine x-ray and then for monthly visits to three months after surgery.  Generally patients are out of work for four to six weeks following surgery.  He will wear a soft collar for two weeks after surgery.     [facility]    Danae Orleans. Venetia Maxon, M.D.  JDS:gde  cc: Dr. Delia Heady  Dr. Oliver Barre

## 2013-01-06 ENCOUNTER — Encounter (HOSPITAL_COMMUNITY): Payer: Self-pay | Admitting: Vascular Surgery

## 2013-01-06 ENCOUNTER — Inpatient Hospital Stay (HOSPITAL_COMMUNITY)
Admission: RE | Admit: 2013-01-06 | Discharge: 2013-01-07 | DRG: 472 | Disposition: A | Discharge status: Home / Self Care | Payer: Medicare Other | Source: Ambulatory Visit | Attending: Neurosurgery | Admitting: Neurosurgery

## 2013-01-06 ENCOUNTER — Encounter (HOSPITAL_COMMUNITY): Payer: Self-pay | Admitting: *Deleted

## 2013-01-06 ENCOUNTER — Ambulatory Visit (HOSPITAL_COMMUNITY): Payer: Medicare Other

## 2013-01-06 ENCOUNTER — Ambulatory Visit (HOSPITAL_COMMUNITY): Payer: Medicare Other | Admitting: Vascular Surgery

## 2013-01-06 ENCOUNTER — Encounter (HOSPITAL_COMMUNITY)
Admission: RE | Disposition: A | Discharge status: Home / Self Care | Payer: Self-pay | Source: Ambulatory Visit | Attending: Neurosurgery

## 2013-01-06 DIAGNOSIS — E7800 Pure hypercholesterolemia, unspecified: Secondary | ICD-10-CM | POA: Diagnosis present

## 2013-01-06 DIAGNOSIS — Z01812 Encounter for preprocedural laboratory examination: Secondary | ICD-10-CM

## 2013-01-06 DIAGNOSIS — Z79899 Other long term (current) drug therapy: Secondary | ICD-10-CM

## 2013-01-06 DIAGNOSIS — Z01810 Encounter for preprocedural cardiovascular examination: Secondary | ICD-10-CM

## 2013-01-06 DIAGNOSIS — Z8673 Personal history of transient ischemic attack (TIA), and cerebral infarction without residual deficits: Secondary | ICD-10-CM

## 2013-01-06 DIAGNOSIS — Z96659 Presence of unspecified artificial knee joint: Secondary | ICD-10-CM

## 2013-01-06 DIAGNOSIS — M5000 Cervical disc disorder with myelopathy, unspecified cervical region: Secondary | ICD-10-CM | POA: Diagnosis present

## 2013-01-06 DIAGNOSIS — Z01818 Encounter for other preprocedural examination: Secondary | ICD-10-CM

## 2013-01-06 DIAGNOSIS — I10 Essential (primary) hypertension: Secondary | ICD-10-CM | POA: Diagnosis present

## 2013-01-06 DIAGNOSIS — E039 Hypothyroidism, unspecified: Secondary | ICD-10-CM | POA: Diagnosis present

## 2013-01-06 DIAGNOSIS — M2449 Recurrent dislocation, other specified joint: Secondary | ICD-10-CM | POA: Diagnosis present

## 2013-01-06 DIAGNOSIS — M4712 Other spondylosis with myelopathy, cervical region: Principal | ICD-10-CM | POA: Diagnosis present

## 2013-01-06 HISTORY — PX: ANTERIOR CERVICAL DECOMP/DISCECTOMY FUSION: SHX1161

## 2013-01-06 HISTORY — DX: Adverse effect of unspecified anesthetic, initial encounter: T41.45XA

## 2013-01-06 HISTORY — DX: Other complications of anesthesia, initial encounter: T88.59XA

## 2013-01-06 SURGERY — ANTERIOR CERVICAL DECOMPRESSION/DISCECTOMY FUSION 1 LEVEL
Anesthesia: General | Site: Spine Cervical | Wound class: Clean

## 2013-01-06 MED ORDER — VANCOMYCIN HCL IN DEXTROSE 1-5 GM/200ML-% IV SOLN
1000.0000 mg | Freq: Two times a day (BID) | INTRAVENOUS | Status: AC
  Administered 2013-01-06: 1000 mg via INTRAVENOUS
  Filled 2013-01-06: qty 200

## 2013-01-06 MED ORDER — ACETAMINOPHEN 650 MG RE SUPP: 650.0000 mg | RECTAL | Status: DC | PRN

## 2013-01-06 MED ORDER — SODIUM CHLORIDE 0.9 % IV SOLN: 250.0000 mL | INTRAVENOUS | Status: DC

## 2013-01-06 MED ORDER — LEVOTHYROXINE SODIUM 50 MCG PO TABS
50.0000 ug | ORAL_TABLET | Freq: Every day | ORAL | Status: DC
  Administered 2013-01-07: 50 ug via ORAL
  Filled 2013-01-06 (×2): qty 1

## 2013-01-06 MED ORDER — ARTIFICIAL TEARS OP OINT
TOPICAL_OINTMENT | OPHTHALMIC | Status: DC | PRN
  Administered 2013-01-06: 1 via OPHTHALMIC

## 2013-01-06 MED ORDER — BISACODYL 10 MG RE SUPP: 10.0000 mg | Freq: Every day | RECTAL | Status: DC | PRN

## 2013-01-06 MED ORDER — HYDROMORPHONE HCL PF 1 MG/ML IJ SOLN: 0.2500 mg | INTRAMUSCULAR | Status: DC | PRN

## 2013-01-06 MED ORDER — SODIUM CHLORIDE 0.9 % IJ SOLN: 3.0000 mL | INTRAMUSCULAR | Status: DC | PRN

## 2013-01-06 MED ORDER — HYDROCODONE-ACETAMINOPHEN 5-325 MG PO TABS: 1.0000 | ORAL_TABLET | ORAL | Status: DC | PRN

## 2013-01-06 MED ORDER — DEXAMETHASONE SODIUM PHOSPHATE 10 MG/ML IJ SOLN
INTRAMUSCULAR | Status: DC | PRN
  Administered 2013-01-06: 10 mg via INTRAVENOUS

## 2013-01-06 MED ORDER — ONDANSETRON HCL 4 MG/2ML IJ SOLN: 4.0000 mg | INTRAMUSCULAR | Status: DC | PRN

## 2013-01-06 MED ORDER — SODIUM CHLORIDE 0.9 % IJ SOLN
3.0000 mL | Freq: Two times a day (BID) | INTRAMUSCULAR | Status: DC
  Administered 2013-01-06: 3 mL via INTRAVENOUS

## 2013-01-06 MED ORDER — FENTANYL CITRATE 0.05 MG/ML IJ SOLN
INTRAMUSCULAR | Status: DC | PRN
  Administered 2013-01-06: 50 ug via INTRAVENOUS
  Administered 2013-01-06: 150 ug via INTRAVENOUS

## 2013-01-06 MED ORDER — GLYCOPYRROLATE 0.2 MG/ML IJ SOLN
INTRAMUSCULAR | Status: DC | PRN
  Administered 2013-01-06: 0.4 mg via INTRAVENOUS

## 2013-01-06 MED ORDER — SENNA 8.6 MG PO TABS
1.0000 | ORAL_TABLET | Freq: Two times a day (BID) | ORAL | Status: DC
  Administered 2013-01-06 (×2): 8.6 mg via ORAL
  Filled 2013-01-06 (×4): qty 1

## 2013-01-06 MED ORDER — DIAZEPAM 5 MG PO TABS
5.0000 mg | ORAL_TABLET | Freq: Four times a day (QID) | ORAL | Status: DC | PRN
  Administered 2013-01-06: 5 mg via ORAL
  Filled 2013-01-06: qty 1

## 2013-01-06 MED ORDER — HEMOSTATIC AGENTS (NO CHARGE) OPTIME
TOPICAL | Status: DC | PRN
  Administered 2013-01-06: 1 via TOPICAL

## 2013-01-06 MED ORDER — OXYCODONE HCL 5 MG PO TABS: 5.0000 mg | ORAL_TABLET | Freq: Once | ORAL | Status: DC | PRN

## 2013-01-06 MED ORDER — BUPIVACAINE HCL (PF) 0.5 % IJ SOLN
INTRAMUSCULAR | Status: DC | PRN
  Administered 2013-01-06: 3.5 mL

## 2013-01-06 MED ORDER — LIDOCAINE-EPINEPHRINE 1 %-1:100000 IJ SOLN
INTRAMUSCULAR | Status: DC | PRN
  Administered 2013-01-06: 3.5 mL

## 2013-01-06 MED ORDER — MENTHOL 3 MG MT LOZG: 1.0000 | LOZENGE | OROMUCOSAL | Status: DC | PRN

## 2013-01-06 MED ORDER — MORPHINE SULFATE 2 MG/ML IJ SOLN: 1.0000 mg | INTRAMUSCULAR | Status: DC | PRN

## 2013-01-06 MED ORDER — NEOSTIGMINE METHYLSULFATE 1 MG/ML IJ SOLN
INTRAMUSCULAR | Status: DC | PRN
  Administered 2013-01-06: 3 mg via INTRAVENOUS

## 2013-01-06 MED ORDER — MIDAZOLAM HCL 5 MG/5ML IJ SOLN
INTRAMUSCULAR | Status: DC | PRN
  Administered 2013-01-06: 2 mg via INTRAVENOUS

## 2013-01-06 MED ORDER — KCL IN DEXTROSE-NACL 20-5-0.45 MEQ/L-%-% IV SOLN
INTRAVENOUS | Status: DC
  Filled 2013-01-06 (×3): qty 1000

## 2013-01-06 MED ORDER — FLEET ENEMA 7-19 GM/118ML RE ENEM
1.0000 | ENEMA | Freq: Once | RECTAL | Status: AC | PRN
  Filled 2013-01-06: qty 1

## 2013-01-06 MED ORDER — SIMVASTATIN 10 MG PO TABS
10.0000 mg | ORAL_TABLET | Freq: Every day | ORAL | Status: DC
  Administered 2013-01-06: 10 mg via ORAL
  Filled 2013-01-06 (×2): qty 1

## 2013-01-06 MED ORDER — MEPERIDINE HCL 25 MG/ML IJ SOLN: 6.2500 mg | INTRAMUSCULAR | Status: DC | PRN

## 2013-01-06 MED ORDER — ROCURONIUM BROMIDE 100 MG/10ML IV SOLN
INTRAVENOUS | Status: DC | PRN
  Administered 2013-01-06: 50 mg via INTRAVENOUS

## 2013-01-06 MED ORDER — ONDANSETRON HCL 4 MG/2ML IJ SOLN
INTRAMUSCULAR | Status: DC | PRN
  Administered 2013-01-06: 4 mg via INTRAVENOUS

## 2013-01-06 MED ORDER — 0.9 % SODIUM CHLORIDE (POUR BTL) OPTIME
TOPICAL | Status: DC | PRN
  Administered 2013-01-06: 1000 mL

## 2013-01-06 MED ORDER — LORATADINE 10 MG PO TABS
10.0000 mg | ORAL_TABLET | Freq: Every day | ORAL | Status: DC
  Filled 2013-01-06: qty 1

## 2013-01-06 MED ORDER — PHENOL 1.4 % MT LIQD: 1.0000 | OROMUCOSAL | Status: DC | PRN

## 2013-01-06 MED ORDER — SENNOSIDES-DOCUSATE SODIUM 8.6-50 MG PO TABS
1.0000 | ORAL_TABLET | Freq: Every evening | ORAL | Status: DC | PRN
  Administered 2013-01-06: 1 via ORAL
  Filled 2013-01-06: qty 1

## 2013-01-06 MED ORDER — LACTATED RINGERS IV SOLN
INTRAVENOUS | Status: DC | PRN
  Administered 2013-01-06 (×2): via INTRAVENOUS

## 2013-01-06 MED ORDER — AMLODIPINE BESYLATE 5 MG PO TABS
5.0000 mg | ORAL_TABLET | Freq: Every day | ORAL | Status: DC
  Filled 2013-01-06: qty 1

## 2013-01-06 MED ORDER — ONDANSETRON HCL 4 MG/2ML IJ SOLN: 4.0000 mg | Freq: Once | INTRAMUSCULAR | Status: DC | PRN

## 2013-01-06 MED ORDER — FENOFIBRATE 160 MG PO TABS
160.0000 mg | ORAL_TABLET | Freq: Every day | ORAL | Status: DC
Start: 2013-01-06 — End: 2013-01-07
  Administered 2013-01-06: 160 mg via ORAL
  Filled 2013-01-06 (×2): qty 1

## 2013-01-06 MED ORDER — PROPOFOL 10 MG/ML IV BOLUS
INTRAVENOUS | Status: DC | PRN
  Administered 2013-01-06: 50 mg via INTRAVENOUS
  Administered 2013-01-06: 100 mg via INTRAVENOUS

## 2013-01-06 MED ORDER — INSULIN ASPART 100 UNIT/ML ~~LOC~~ SOLN: 0.0000 [IU] | Freq: Three times a day (TID) | SUBCUTANEOUS | Status: DC

## 2013-01-06 MED ORDER — DOCUSATE SODIUM 100 MG PO CAPS
100.0000 mg | ORAL_CAPSULE | Freq: Two times a day (BID) | ORAL | Status: DC
  Administered 2013-01-06 (×2): 100 mg via ORAL
  Filled 2013-01-06 (×2): qty 1

## 2013-01-06 MED ORDER — ACETAMINOPHEN 325 MG PO TABS: 650.0000 mg | ORAL_TABLET | ORAL | Status: DC | PRN

## 2013-01-06 MED ORDER — OXYCODONE HCL 5 MG/5ML PO SOLN: 5.0000 mg | Freq: Once | ORAL | Status: DC | PRN

## 2013-01-06 MED ORDER — OXYCODONE-ACETAMINOPHEN 5-325 MG PO TABS
1.0000 | ORAL_TABLET | ORAL | Status: DC | PRN
  Administered 2013-01-06: 2 via ORAL
  Filled 2013-01-06: qty 2

## 2013-01-06 MED ORDER — LABETALOL HCL 200 MG PO TABS
200.0000 mg | ORAL_TABLET | Freq: Two times a day (BID) | ORAL | Status: DC
  Administered 2013-01-06: 200 mg via ORAL
  Filled 2013-01-06 (×3): qty 1

## 2013-01-06 MED ORDER — HYDROCHLOROTHIAZIDE 25 MG PO TABS
25.0000 mg | ORAL_TABLET | Freq: Every day | ORAL | Status: DC
  Administered 2013-01-06: 25 mg via ORAL
  Filled 2013-01-06 (×2): qty 1

## 2013-01-06 MED ORDER — THROMBIN 5000 UNITS EX KIT
PACK | CUTANEOUS | Status: DC | PRN
  Administered 2013-01-06 (×2): 5000 [IU] via TOPICAL

## 2013-01-06 MED ORDER — EPHEDRINE SULFATE 50 MG/ML IJ SOLN
INTRAMUSCULAR | Status: DC | PRN
  Administered 2013-01-06: 5 mg via INTRAVENOUS
  Administered 2013-01-06: 10 mg via INTRAVENOUS

## 2013-01-06 MED ORDER — PANTOPRAZOLE SODIUM 40 MG IV SOLR
40.0000 mg | Freq: Every day | INTRAVENOUS | Status: DC
  Administered 2013-01-06: 40 mg via INTRAVENOUS
  Filled 2013-01-06 (×2): qty 40

## 2013-01-06 SURGICAL SUPPLY — 75 items
ADH SKN CLS APL DERMABOND .7 (GAUZE/BANDAGES/DRESSINGS)
ADH SKN CLS LQ APL DERMABOND (GAUZE/BANDAGES/DRESSINGS) ×1
APL SKNCLS STERI-STRIP NONHPOA (GAUZE/BANDAGES/DRESSINGS)
BAG DECANTER FOR FLEXI CONT (MISCELLANEOUS) ×2 IMPLANT
BANDAGE GAUZE ELAST BULKY 4 IN (GAUZE/BANDAGES/DRESSINGS) ×2 IMPLANT
BENZOIN TINCTURE PRP APPL 2/3 (GAUZE/BANDAGES/DRESSINGS) IMPLANT
BIT DRILL 14MM (INSTRUMENTS) IMPLANT
BIT DRILL NEURO 2X3.1 SFT TUCH (MISCELLANEOUS) ×1 IMPLANT
BLADE ULTRA TIP 2M (BLADE) ×1 IMPLANT
BUR BARREL STRAIGHT FLUTE 4.0 (BURR) ×2 IMPLANT
CANISTER SUCTION 2500CC (MISCELLANEOUS) ×2 IMPLANT
CLOTH BEACON ORANGE TIMEOUT ST (SAFETY) ×2 IMPLANT
CONT SPEC 4OZ CLIKSEAL STRL BL (MISCELLANEOUS) ×2 IMPLANT
COVER MAYO STAND STRL (DRAPES) ×2 IMPLANT
DERMABOND ADHESIVE PROPEN (GAUZE/BANDAGES/DRESSINGS) ×1
DERMABOND ADVANCED (GAUZE/BANDAGES/DRESSINGS)
DERMABOND ADVANCED .7 DNX12 (GAUZE/BANDAGES/DRESSINGS) ×1 IMPLANT
DERMABOND ADVANCED .7 DNX6 (GAUZE/BANDAGES/DRESSINGS) IMPLANT
DRAPE LAPAROTOMY 100X72 PEDS (DRAPES) ×2 IMPLANT
DRAPE MICROSCOPE LEICA (MISCELLANEOUS) ×2 IMPLANT
DRAPE POUCH INSTRU U-SHP 10X18 (DRAPES) ×2 IMPLANT
DRAPE PROXIMA HALF (DRAPES) IMPLANT
DRESSING TELFA 8X3 (GAUZE/BANDAGES/DRESSINGS) IMPLANT
DRILL 14MM (INSTRUMENTS) ×2
DRILL NEURO 2X3.1 SOFT TOUCH (MISCELLANEOUS) ×2
DURAPREP 6ML APPLICATOR 50/CS (WOUND CARE) ×2 IMPLANT
ELECT COATED BLADE 2.86 ST (ELECTRODE) ×2 IMPLANT
ELECT REM PT RETURN 9FT ADLT (ELECTROSURGICAL) ×2
ELECTRODE REM PT RTRN 9FT ADLT (ELECTROSURGICAL) ×1 IMPLANT
GAUZE SPONGE 4X4 16PLY XRAY LF (GAUZE/BANDAGES/DRESSINGS) IMPLANT
GLOVE BIO SURGEON STRL SZ8 (GLOVE) ×2 IMPLANT
GLOVE BIOGEL M 8.0 STRL (GLOVE) IMPLANT
GLOVE BIOGEL PI IND STRL 8 (GLOVE) ×1 IMPLANT
GLOVE BIOGEL PI IND STRL 8.5 (GLOVE) ×1 IMPLANT
GLOVE BIOGEL PI INDICATOR 8 (GLOVE) ×1
GLOVE BIOGEL PI INDICATOR 8.5 (GLOVE) ×1
GLOVE ECLIPSE 8.0 STRL XLNG CF (GLOVE) ×2 IMPLANT
GLOVE EXAM NITRILE LRG STRL (GLOVE) IMPLANT
GLOVE EXAM NITRILE MD LF STRL (GLOVE) IMPLANT
GLOVE EXAM NITRILE XL STR (GLOVE) IMPLANT
GLOVE EXAM NITRILE XS STR PU (GLOVE) IMPLANT
GLOVE INDICATOR 7.0 STRL GRN (GLOVE) ×1 IMPLANT
GLOVE SURG SS PI 7.0 STRL IVOR (GLOVE) ×4 IMPLANT
GOWN BRE IMP SLV AUR LG STRL (GOWN DISPOSABLE) IMPLANT
GOWN BRE IMP SLV AUR XL STRL (GOWN DISPOSABLE) ×3 IMPLANT
GOWN STRL REIN 2XL LVL4 (GOWN DISPOSABLE) ×1 IMPLANT
HEAD HALTER (SOFTGOODS) ×2 IMPLANT
KIT BASIN OR (CUSTOM PROCEDURE TRAY) ×2 IMPLANT
KIT ROOM TURNOVER OR (KITS) ×2 IMPLANT
NDL HYPO 18GX1.5 BLUNT FILL (NEEDLE) IMPLANT
NDL HYPO 25X1 1.5 SAFETY (NEEDLE) ×1 IMPLANT
NDL SPNL 22GX3.5 QUINCKE BK (NEEDLE) ×1 IMPLANT
NEEDLE HYPO 18GX1.5 BLUNT FILL (NEEDLE) IMPLANT
NEEDLE HYPO 25X1 1.5 SAFETY (NEEDLE) ×2 IMPLANT
NEEDLE SPNL 22GX3.5 QUINCKE BK (NEEDLE) ×2 IMPLANT
NS IRRIG 1000ML POUR BTL (IV SOLUTION) ×2 IMPLANT
PACK LAMINECTOMY NEURO (CUSTOM PROCEDURE TRAY) ×2 IMPLANT
PAD ARMBOARD 7.5X6 YLW CONV (MISCELLANEOUS) ×6 IMPLANT
PIN DISTRACTION 14MM (PIN) ×4 IMPLANT
PLATE 14MM (Plate) ×1 IMPLANT
RUBBERBAND STERILE (MISCELLANEOUS) ×4 IMPLANT
SCREW 14MM (Screw) ×4 IMPLANT
SPACER ASSEM CERV LORD 7M (Spacer) ×1 IMPLANT
SPONGE GAUZE 4X4 12PLY (GAUZE/BANDAGES/DRESSINGS) IMPLANT
SPONGE INTESTINAL PEANUT (DISPOSABLE) ×2 IMPLANT
SPONGE SURGIFOAM ABS GEL SZ50 (HEMOSTASIS) ×2 IMPLANT
STAPLER SKIN PROX WIDE 3.9 (STAPLE) IMPLANT
STRIP CLOSURE SKIN 1/2X4 (GAUZE/BANDAGES/DRESSINGS) IMPLANT
SUT VIC AB 3-0 SH 8-18 (SUTURE) ×4 IMPLANT
SYR 20ML ECCENTRIC (SYRINGE) ×2 IMPLANT
SYR 3ML LL SCALE MARK (SYRINGE) IMPLANT
TOWEL OR 17X24 6PK STRL BLUE (TOWEL DISPOSABLE) ×2 IMPLANT
TOWEL OR 17X26 10 PK STRL BLUE (TOWEL DISPOSABLE) ×2 IMPLANT
TRAP SPECIMEN MUCOUS 40CC (MISCELLANEOUS) ×2 IMPLANT
WATER STERILE IRR 1000ML POUR (IV SOLUTION) ×2 IMPLANT

## 2013-01-06 NOTE — Op Note (Signed)
01/06/2013  8:58 AM  PATIENT:  [name]  67 y.o. male  PRE-OPERATIVE DIAGNOSIS:  Cervical spondylosis with myelopathy, Cervical hnp with myelopathy, Cervical stenosis, Cervical subluxation C34  POST-OPERATIVE DIAGNOSIS:  Cervical Spondylosis with myelopathy,Cervical herniated nucleous pulposus with myelopathy,Cervical stenosis, Cervical subluxation C34   PROCEDURE:  Procedure(s) with comments: ANTERIOR CERVICAL DECOMPRESSION/DISCECTOMY FUSION 1 LEVEL (N/A) - Cervical three-four Anterior cervical decompression/diskectomy/fusion with allograft, autograft, plate  SURGEON:  Surgeon(s) and Role:    * Lacosta Hargan, MD - Primary  PHYSICIAN ASSISTANT:   ASSISTANTS: Poteat, RN   ANESTHESIA:   general  EBL:  Total I/O In: 1000 [I.V.:1000] Out: -   BLOOD ADMINISTERED:none  DRAINS: none   LOCAL MEDICATIONS USED:  LIDOCAINE   SPECIMEN:  No Specimen  DISPOSITION OF SPECIMEN:  N/A  COUNTS:  YES  TOURNIQUET:  * No tourniquets in log *  DICTATION: Patient is 67 year old male with subluxation of C3 on 4 with stenosis, myelopathy, cord compression, HNP.  It was elected to take him to surgery for anterior cervical decompression and fusion C 34 level.  PROCEDURE: Patient was brought to operating room and following the smooth and uncomplicated induction of general endotracheal anesthesia his head was placed on a horseshoe head holder he was placed in 5 pounds of Holter traction and his anterior neck was prepped and draped in usual sterile fashion. An incision was made on the left side of midline after infiltrating the skin and subcutaneous tissues with local lidocaine. The platysmal layer was incised and subplatysmal dissection was performed exposing the anterior border sternocleidomastoid muscle. Using blunt dissection the carotid sheath was kept lateral and trachea and esophagus kept medial exposing the anterior cervical spine. A bent spinal needle was placed it was felt to be the C 34  level and this was confirmed on intraoperative x-ray. Longus coli muscles were taken down from the anterior cervical spine using electrocautery and key elevator and self-retaining retractor was placed exposing the C 34 level. The interspace was incised and a thorough discectomy was performed. Distraction pins were placed. Uncinate spurs and central spondylitic ridges were drilled down with a high-speed drill. The spinal cord dura and both C4 nerve roots were widely decompressed. Hemostasis was assured. After trial sizing a 7 mm lordotic allograft bone wedge was selected and packed with local autograft. This was tamped into position and countersunk appropriately. Distraction weight was removed. A 14 mm trestle luxe anterior cervical plate was affixed to the cervical spine with 14 mm variable-angle screws 2 at C3, 2 at C4. All screws were well-positioned and locking mechanisms were engaged. A final X ray was obtained which showed well positioned graft and anterior plate without complicating features. Soft tissues were inspected and found to be in good repair. The wound was irrigated. The platysma layer was closed with 3-0 Vicryl stitches and the skin was reapproximated with 3-0 Vicryl subcuticular stitches. The wound was dressed with Dermabond. Counts were correct at the end of the case. Patient was extubated and taken to recovery in stable and satisfactory condition.    PLAN OF CARE: Admit for overnight observation  PATIENT DISPOSITION:  PACU - hemodynamically stable.   Delay start of Pharmacological VTE agent (>24hrs) due to surgical blood loss or risk of bleeding: yes  

## 2013-01-06 NOTE — Brief Op Note (Signed)
01/06/2013  8:58 AM  PATIENT:  [name]  67 y.o. male  PRE-OPERATIVE DIAGNOSIS:  Cervical spondylosis with myelopathy, Cervical hnp with myelopathy, Cervical stenosis, Cervical subluxation C34  POST-OPERATIVE DIAGNOSIS:  Cervical Spondylosis with myelopathy,Cervical herniated nucleous pulposus with myelopathy,Cervical stenosis, Cervical subluxation C34   PROCEDURE:  Procedure(s) with comments: ANTERIOR CERVICAL DECOMPRESSION/DISCECTOMY FUSION 1 LEVEL (N/A) - Cervical three-four Anterior cervical decompression/diskectomy/fusion with allograft, autograft, plate  SURGEON:  Surgeon(s) and Role:    * Maeola Harman, MD - Primary  PHYSICIAN ASSISTANT:   ASSISTANTS: Poteat, RN   ANESTHESIA:   general  EBL:  Total I/O In: 1000 [I.V.:1000] Out: -   BLOOD ADMINISTERED:none  DRAINS: none   LOCAL MEDICATIONS USED:  LIDOCAINE   SPECIMEN:  No Specimen  DISPOSITION OF SPECIMEN:  N/A  COUNTS:  YES  TOURNIQUET:  * No tourniquets in log *  DICTATION: Patient is 67 year old male with subluxation of C3 on 4 with stenosis, myelopathy, cord compression, HNP.  It was elected to take him to surgery for anterior cervical decompression and fusion C 34 level.  PROCEDURE: Patient was brought to operating room and following the smooth and uncomplicated induction of general endotracheal anesthesia his head was placed on a horseshoe head holder he was placed in 5 pounds of Holter traction and his anterior neck was prepped and draped in usual sterile fashion. An incision was made on the left side of midline after infiltrating the skin and subcutaneous tissues with local lidocaine. The platysmal layer was incised and subplatysmal dissection was performed exposing the anterior border sternocleidomastoid muscle. Using blunt dissection the carotid sheath was kept lateral and trachea and esophagus kept medial exposing the anterior cervical spine. A bent spinal needle was placed it was felt to be the C 34  level and this was confirmed on intraoperative x-ray. Longus coli muscles were taken down from the anterior cervical spine using electrocautery and key elevator and self-retaining retractor was placed exposing the C 34 level. The interspace was incised and a thorough discectomy was performed. Distraction pins were placed. Uncinate spurs and central spondylitic ridges were drilled down with a high-speed drill. The spinal cord dura and both C4 nerve roots were widely decompressed. Hemostasis was assured. After trial sizing a 7 mm lordotic allograft bone wedge was selected and packed with local autograft. This was tamped into position and countersunk appropriately. Distraction weight was removed. A 14 mm trestle luxe anterior cervical plate was affixed to the cervical spine with 14 mm variable-angle screws 2 at C3, 2 at C4. All screws were well-positioned and locking mechanisms were engaged. A final X ray was obtained which showed well positioned graft and anterior plate without complicating features. Soft tissues were inspected and found to be in good repair. The wound was irrigated. The platysma layer was closed with 3-0 Vicryl stitches and the skin was reapproximated with 3-0 Vicryl subcuticular stitches. The wound was dressed with Dermabond. Counts were correct at the end of the case. Patient was extubated and taken to recovery in stable and satisfactory condition.    PLAN OF CARE: Admit for overnight observation  PATIENT DISPOSITION:  PACU - hemodynamically stable.   Delay start of Pharmacological VTE agent (>24hrs) due to surgical blood loss or risk of bleeding: yes

## 2013-01-06 NOTE — Transfer of Care (Signed)
Immediate Anesthesia Transfer of Care Note  Patient: [name]  Procedure(s) Performed: Procedure(s) with comments: ANTERIOR CERVICAL DECOMPRESSION/DISCECTOMY FUSION 1 LEVEL (N/A) - Cervical three-four Anterior cervical decompression/diskectomy/fusion  Patient Location: PACU  Anesthesia Type:General  Level of Consciousness: awake, alert , oriented and patient cooperative  Airway & Oxygen Therapy: Patient Spontanous Breathing and Patient connected to nasal cannula oxygen  Post-op Assessment: Report given to PACU RN, Post -op Vital signs reviewed and stable and Patient moving all extremities  Post vital signs: Reviewed and stable  Complications: No apparent anesthesia complications

## 2013-01-06 NOTE — Progress Notes (Signed)
UR COMPLETED  

## 2013-01-06 NOTE — Evaluation (Addendum)
Physical Therapy Evaluation and Discharge Patient Details Name: [name] MRN: [id number] DOB: 1946-08-06 Today's Date: 01/06/2013 Time: 4098-1191 PT Time Calculation (min): 22 min  PT Assessment / Plan / Recommendation Clinical Impression  67 yo very pleasant man s/p C3-4 ACDF due to cord compression with weakness and numbness in legs. Pt knowlegeable re: use of DME (as he has used after multiple surgeries). All questions answered and encouraged use of RW to incr safety and decr risk of injury/delayed healing if he were to stumble or fall. Pt in agreement. Wife present throughout session and all questions answered. Pt agreeable to OT eval to discuss how to do his shower with or without collar.    PT Assessment  Patent does not need any further PT services    Follow Up Recommendations  No PT follow up    Does the patient have the potential to tolerate intense rehabilitation      Barriers to Discharge        Equipment Recommendations  None recommended by PT    Recommendations for Other Services OT consult   Frequency      Precautions / Restrictions Precautions Precautions: Fall;Cervical Required Braces or Orthoses: Cervical Brace Cervical Brace: Hard collar;Other (comment) (at all times)   Pertinent Vitals/Pain 4/10 neck pain (as PTA per pt)      Mobility  Bed Mobility Bed Mobility: Supine to Sit;Sitting - Scoot to Edge of Bed;Sit to Supine Supine to Sit: 6: Modified independent (Device/Increase time) Sitting - Scoot to Edge of Bed: 7: Independent Sit to Supine: 6: Modified independent (Device/Increase time) Transfers Transfers: Sit to Stand;Stand to Sit Sit to Stand: 4: Min guard;With upper extremity assist;From bed Stand to Sit: 4: Min guard;With upper extremity assist;To bed Details for Transfer Assistance: minguard for safety due to h/o imbalance; no LOB Ambulation/Gait Ambulation/Gait Assistance: 4: Min guard Ambulation Distance (Feet): 200 Feet Assistive  device: Straight cane;Other (Comment) (his cane has large rubber tip (triangular)) Ambulation/Gait Assistance Details: pt slow with wide BOS, no LOB, however unsteady; discussed use of RW for at least 1 week to incr safety and decr risk of falls and pt agreeable; Pt inquiring re: use of 4 wheeled RW and explained to him the risks with this type walker (too far away from his body, incr stress on shoulders/neck, less stability due to swivel wheels) and pt agreeable not to use one. Gait Pattern: Step-through pattern;Decreased stride length;Wide base of support Gait velocity: decr            Visit Information  Last PT Received On: 01/06/13 Assistance Needed: +1    Subjective Data  Subjective: States he has had terrible balance that is already better since surgery; reports one fall (forwards to hands/knees) in past 6 months Patient Stated Goal: go home tomorrow   Prior Functioning  Home Living Lives With: Spouse Available Help at Discharge: Family;Available 24 hours/day Type of Home: House Home Access: Stairs to enter [employer] of Steps: 5 Entrance Stairs-Rails: Right;Left Home Layout: One level Bathroom Shower/Tub: Health visitor: Standard Bathroom Accessibility: Yes How Accessible: Accessible via walker Home Adaptive Equipment: Bedside commode/3-in-1;Grab bars around toilet;Grab bars in shower;Straight cane;Walker - rolling Additional Comments: wife also uses cane and can provide supervision; pt uses BSC in walk-in shower and has one over his toilet Prior Function Level of Independence: Independent with assistive device(s) Able to Take Stairs?: Yes Communication Communication: No difficulties    Cognition  Cognition Overall Cognitive Status: Appears within functional limits for  tasks assessed/performed Arousal/Alertness: Awake/alert Orientation Level: Oriented X4 / Intact Behavior During Session: WFL for tasks performed    Extremity/Trunk  Assessment Right Lower Extremity Assessment RLE ROM/Strength/Tone: WFL for tasks assessed RLE Sensation: Deficits RLE Sensation Deficits: numbness as PTA Left Lower Extremity Assessment LLE ROM/Strength/Tone: WFL for tasks assessed LLE Sensation: Deficits LLE Sensation Deficits: numbness as PTA Trunk Assessment Trunk Assessment: Normal   Balance Balance Balance Assessed: Yes Static Standing Balance Static Standing - Balance Support: Left upper extremity supported Static Standing - Level of Assistance: 5: Stand by assistance Static Standing - Comment/# of Minutes: up to 1 minute with no sway  Dynamic Standing Balance Dynamic Standing - Balance Support: Left upper extremity supported Dynamic Standing - Level of Assistance: 4: Min assist Dynamic Standing - Comments: turns into and out of bathroom, reaching for door; minguard for safety due to unsteady  End of Session PT - End of Session Equipment Utilized During Treatment: Gait belt;Cervical collar Activity Tolerance: Patient tolerated treatment well Patient left: in bed;with call bell/phone within reach;with family/visitor present Nurse Communication: Mobility status;Other (comment) (pt voided in urinal for them to measure)  GP     Stefania Goulart 01/06/2013, 4:44 PM Pager [phone]

## 2013-01-06 NOTE — Progress Notes (Signed)
Awake, alert, conversant.  MAEW with good strength.  Doing well. 

## 2013-01-06 NOTE — Anesthesia Preprocedure Evaluation (Addendum)
Anesthesia Evaluation  Patient identified by MRN, date of birth, ID band Patient awake    Reviewed: Allergy & Precautions, H&P , NPO status , Patient's Chart, lab work & pertinent test results, reviewed documented beta blocker date and time   Airway Mallampati: I TM Distance: >3 FB Neck ROM: Full    Dental  (+) Poor Dentition and Dental Advisory Given   Pulmonary          Cardiovascular hypertension, Pt. on medications     Neuro/Psych  Headaches,    GI/Hepatic GERD-  ,S/P Nissen Fundoplication 2001 No symptoms currently   Endo/Other  neg diabetesHypothyroidism   Renal/GU      Musculoskeletal   Abdominal   Peds  Hematology   Anesthesia Other Findings   Reproductive/Obstetrics                          Anesthesia Physical Anesthesia Plan  ASA: III  Anesthesia Plan: General   Post-op Pain Management:    Induction: Intravenous  Airway Management Planned: Oral ETT  Additional Equipment:   Intra-op Plan:   Post-operative Plan: Extubation in OR  Informed Consent: I have reviewed the patients History and Physical, chart, labs and discussed the procedure including the risks, benefits and alternatives for the proposed anesthesia with the patient or authorized representative who has indicated his/her understanding and acceptance.     Plan Discussed with: Surgeon and CRNA  Anesthesia Plan Comments:        Anesthesia Quick Evaluation

## 2013-01-06 NOTE — Anesthesia Postprocedure Evaluation (Signed)
Anesthesia Post Note  Patient: [name]  Procedure(s) Performed: Procedure(s) (LRB): ANTERIOR CERVICAL DECOMPRESSION/DISCECTOMY FUSION 1 LEVEL (N/A)  Anesthesia type: general  Patient location: PACU  Post pain: Pain level controlled  Post assessment: Patient's Cardiovascular Status Stable  Last Vitals:  Filed Vitals:   01/06/13 1206  BP: 146/87  Pulse: 78  Temp: 36.4 C  Resp: 18    Post vital signs: Reviewed and stable  Level of consciousness: sedated  Complications: No apparent anesthesia complications

## 2013-01-06 NOTE — Anesthesia Procedure Notes (Signed)
Procedure Name: Intubation Date/Time: 01/06/2013 7:31 AM Performed by: Jerilee Hoh Pre-anesthesia Checklist: Patient identified, Emergency Drugs available, Suction available and Patient being monitored Patient Re-evaluated:Patient Re-evaluated prior to inductionOxygen Delivery Method: Circle system utilized Preoxygenation: Pre-oxygenation with 100% oxygen Intubation Type: IV induction Ventilation: Mask ventilation without difficulty and Oral airway inserted - appropriate to patient size Grade View: Grade I Tube type: Oral Tube size: 7.5 mm Number of attempts: 1 Airway Equipment and Method: Video-laryngoscopy and Rigid stylet Placement Confirmation: ETT inserted through vocal cords under direct vision,  positive ETCO2 and breath sounds checked- equal and bilateral Secured at: 22 cm Tube secured with: Tape Dental Injury: Teeth and Oropharynx as per pre-operative assessment  Comments: Smooth induction.  Glidescope used to avoid overextension of cervical spine.  Easy, atraumatic airway.

## 2013-01-06 NOTE — Interval H&P Note (Signed)
History and Physical Interval Note:  01/06/2013 6:21 AM  [name]  has presented today for surgery, with the diagnosis of Cervical spondylosis with myelopathy, Cervical hnp with myelopathy, Cervical stenosis, Cervical radiculopathy  The various methods of treatment have been discussed with the patient and family. After consideration of risks, benefits and other options for treatment, the patient has consented to  Procedure(s) with comments: ANTERIOR CERVICAL DECOMPRESSION/DISCECTOMY FUSION 1 LEVEL (N/A) - C3-4 Anterior cervical decompression/diskectomy/fusion as a surgical intervention .  The patient's history has been reviewed, patient examined, no change in status, stable for surgery.  I have reviewed the patient's chart and labs.  Questions were answered to the patient's satisfaction.     [name]

## 2013-01-06 NOTE — Preoperative (Signed)
Beta Blockers   Reason not to administer Beta Blockers:Labetelol 0415 today

## 2013-01-06 NOTE — Progress Notes (Signed)
Orthopedic Tech Progress Note Patient Details:  [name] 07-05-1946 [id number] Spoke with patient in room; patient wearing aspen collar and stated he was pre-fitted for collar in doctor's office. No action needed by Ortho Tech. Patient ID: [name], male   DOB: [birth year], 67 y.o.   MRN: [id number]   [name] 01/06/2013, 12:59 PM

## 2013-01-07 LAB — HEMOGLOBIN A1C: Hgb A1c MFr Bld: 6.7 % — ABNORMAL HIGH (ref <5.7)

## 2013-01-07 MED ORDER — OXYCODONE-ACETAMINOPHEN 5-325 MG PO TABS: 1.0000 | ORAL_TABLET | ORAL | Status: DC | PRN

## 2013-01-07 NOTE — Progress Notes (Signed)
Subjective: Patient reports doing well. Balance improved and numbness resolved.  Objective: Vital signs in last 24 hours: Temp:  [96.8 F (36 C)-99.4 F (37.4 C)] 99 F (37.2 C) (02/12 0400) Pulse Rate:  [46-85] 75 (02/12 0400) Resp:  [16-28] 18 (02/12 0400) BP: (131-179)/(61-90) 150/84 mmHg (02/12 0400) SpO2:  [92 %-96 %] 94 % (02/12 0400)  Intake/Output from previous day: 02/11 0701 - 02/12 0700 In: 1000 [I.V.:1000] Out: 1427 [Urine:1427] Intake/Output this shift:    Physical Exam: Strength full.  No numbness.  Incision CDI.  Lab Results: No results found for this basename: WBC, HGB, HCT, PLT,  in the last 72 hours BMET No results found for this basename: NA, K, CL, CO2, GLUCOSE, BUN, CREATININE, CALCIUM,  in the last 72 hours  Studies/Results: Dg Cervical Spine 2-3 Views  01/06/2013  *RADIOLOGY REPORT*  Clinical Data: Cervical fusion C3-4.  CERVICAL SPINE - 2-3 VIEW  Comparison: 12/22/2012  Findings: First lateral intraoperative image demonstrates the anterior localizing instrument directed at C3-4.  Second lateral intraoperative image demonstrates changes of anterior fusion at C3-4.  Stable alignment.  No hardware or bony complicating feature.  IMPRESSION: Changes of anterior fusion as C3-4.   Original Report Authenticated By: Charlett Nose, M.D.     Assessment/Plan: Doing well.  D/C home.    LOS: 1 day    Dorian Heckle, MD 01/07/2013, 8:06 AM

## 2013-01-07 NOTE — Evaluation (Signed)
Occupational Therapy Evaluation Patient Details Name: GEOFFRY BANNISTER MRN: [id number] DOB: [birth year] Today's Date: 01/07/2013 Time: 1478-2956 OT Time Calculation (min): 25 min  OT Assessment / Plan / Recommendation Clinical Impression  Pt is recovering from ACDF.  Instructed in cervical precautions related to ADL and IADL.  Pt has all necessary DME, will have supervision of his wife.    OT Assessment  Patient does not need any further OT services    Follow Up Recommendations  No OT follow up    Barriers to Discharge      Equipment Recommendations  None recommended by OT    Recommendations for Other Services    Frequency       Precautions / Restrictions Precautions Precautions: Fall;Cervical Required Braces or Orthoses: Cervical Brace Cervical Brace: Hard collar Restrictions Weight Bearing Restrictions: No   Pertinent Vitals/Pain No pain    ADL  Eating/Feeding: Independent Where Assessed - Eating/Feeding: Edge of bed Grooming: Wash/dry hands;Supervision/safety Where Assessed - Grooming: Unsupported standing Upper Body Bathing: Supervision/safety Where Assessed - Upper Body Bathing: Unsupported sitting Lower Body Bathing: Supervision/safety Where Assessed - Lower Body Bathing: Unsupported sitting;Supported sit to stand Upper Body Dressing: Set up Where Assessed - Upper Body Dressing: Unsupported sitting Lower Body Dressing: Supervision/safety Where Assessed - Lower Body Dressing: Unsupported sitting;Supported sit to stand Toilet Transfer: Supervision/safety Toilet Transfer Method: Sit to Barista: Regular height toilet;Grab bars Equipment Used: Long-handled sponge;Long-handled shoe horn;Reacher;Sock aid Transfers/Ambulation Related to ADLs: Recommended use of walker initially upon d/c. ADL Comments: Instructed pt in cervical precautions related to ADL and reviewed handout.    OT Diagnosis:    OT Problem List:   OT Treatment Interventions:      OT Goals    Visit Information  Last OT Received On: 01/07/13 Assistance Needed: +1    Subjective Data  Subjective: "I have a hospital bed in my [facility] room." Patient Stated Goal: Home with assist of daughter and wife.   Prior Functioning     Home Living Lives With: Spouse Available Help at Discharge: Family;Available 24 hours/day Type of Home: House Home Access: Stairs to enter [employer] of Steps: 5 Entrance Stairs-Rails: Right;Left Home Layout: One level Bathroom Shower/Tub: Health visitor: Standard Bathroom Accessibility: Yes How Accessible: Accessible via walker Home Adaptive Equipment: Bedside commode/3-in-1;Grab bars around toilet;Grab bars in shower;Straight cane;Walker - rolling;Hospital bed Additional Comments: wife also uses cane and can provide supervision; pt uses BSC in walk-in shower and has one over his toilet Prior Function Level of Independence: Independent with assistive device(s) Able to Take Stairs?: Yes Driving: Yes Vocation: Retired Musician: No difficulties Dominant Hand: Left         Vision/Perception Vision - History Baseline Vision: Wears glasses all the time   Cognition  Cognition Overall Cognitive Status: Appears within functional limits for tasks assessed/performed Arousal/Alertness: Awake/alert Orientation Level: Oriented X4 / Intact Behavior During Session: [facility] for tasks performed    Extremity/Trunk Assessment Right Upper Extremity Assessment RUE ROM/Strength/Tone: Unable to fully assess;Due to precautions RUE Sensation: Deficits RUE Sensation Deficits: tingling (improved with sx) Left Upper Extremity Assessment LUE ROM/Strength/Tone: Unable to fully assess;Due to precautions Trunk Assessment Trunk Assessment: Normal     Mobility Bed Mobility Bed Mobility: Supine to Sit;Sitting - Scoot to Edge of Bed;Sit to Supine Supine to Sit: 6: Modified independent  (Device/Increase time) Sitting - Scoot to Edge of Bed: 7: Independent Sit to Supine: 6: Modified independent (Device/Increase time) Details for Bed Mobility Assistance: pt  has a hospital bed at home Transfers Transfers: Sit to Stand;Stand to Sit Sit to Stand: 5: Supervision;With upper extremity assist;From bed;From toilet Stand to Sit: 5: Supervision;With upper extremity assist;To toilet;To bed     Exercise     Balance     End of Session OT - End of Session Activity Tolerance: Patient tolerated treatment well Patient left: in bed;with call bell/phone within reach;with family/visitor present  GO     Evern Bio 01/07/2013, 9:40 AM [phone]

## 2013-01-07 NOTE — Progress Notes (Signed)
Pt given D/C instructions with Rx, verbal understanding given. Pt D/C'd home via wheelchair @ 1020 per MD order. Rema Fendt, RN

## 2013-01-07 NOTE — Discharge Summary (Signed)
Physician Discharge Summary  Patient ID: [name] MRN: [id number] DOB/AGE: [birth year] 67 y.o.  Admit date: 01/06/2013 Discharge date: 01/07/2013  Admission Diagnoses:Cervical spondylotic myelopathy with subluxation of C 3 on C 4 with cord compression and stenosis  Discharge Diagnoses: Cervical spondylotic myelopathy with subluxation of C 3 on C 4 with cord compression and stenosis  Active Problems:   * No active hospital problems. *   Discharged Condition: good  Hospital Course: Uncomplicated anterior cervical decompression and fusion C 34 with decompression of cervical spinal cord and improvement in cervical myelopathy  Consults: None  Significant Diagnostic Studies: None  Treatments: surgery: Uncomplicated anterior cervical decompression and fusion C 34 with decompression of cervical spinal cord  Discharge Exam: Blood pressure 150/84, pulse 75, temperature 99 F (37.2 C), temperature source Oral, resp. rate 18, SpO2 94.00%. Neurologic: Alert and oriented X 3, normal strength and tone. Normal symmetric reflexes. Normal coordination and gait Wound:CDI  Disposition: Home   Future Appointments Provider Department Dept Phone   02/20/2013 10:00 AM Corwin Levins, MD [facility] [phone]       Medication List    STOP taking these medications       meloxicam 15 MG tablet  Commonly known as:  MOBIC      TAKE these medications       amLODipine 5 MG tablet  Commonly known as:  NORVASC  Take 1 tablet (5 mg total) by mouth daily.     aspirin EC 325 MG tablet  Take 1 tablet (325 mg total) by mouth daily.     cetirizine 10 MG tablet  Commonly known as:  ZYRTEC  Take 10 mg by mouth daily.     fenofibrate 160 MG tablet  Take 1 tablet (160 mg total) by mouth daily.     hydrochlorothiazide 25 MG tablet  Commonly known as:  HYDRODIURIL  Take 1 tablet (25 mg total) by mouth daily.     labetalol 200 MG tablet  Commonly known as:  NORMODYNE   Take 1 tablet (200 mg total) by mouth 2 (two) times daily.     levothyroxine 50 MCG tablet  Commonly known as:  SYNTHROID, LEVOTHROID  Take 1 tablet (50 mcg total) by mouth every morning.     oxyCODONE-acetaminophen 5-325 MG per tablet  Commonly known as:  PERCOCET/ROXICET  Take 1-2 tablets by mouth every 4 (four) hours as needed for pain.     pravastatin 20 MG tablet  Commonly known as:  PRAVACHOL  TAKE ONE TABLET IN THE EVENING         Signed: Mahmood Boehringer D, MD 01/07/2013, 8:16 AM

## 2013-01-08 ENCOUNTER — Encounter (HOSPITAL_COMMUNITY): Payer: Self-pay | Admitting: Neurosurgery

## 2013-01-30 ENCOUNTER — Ambulatory Visit: Payer: Medicare Other | Admitting: Internal Medicine

## 2013-02-03 ENCOUNTER — Encounter: Payer: Self-pay | Admitting: Internal Medicine

## 2013-02-03 ENCOUNTER — Ambulatory Visit (INDEPENDENT_AMBULATORY_CARE_PROVIDER_SITE_OTHER): Payer: Medicare Other | Admitting: Internal Medicine

## 2013-02-03 VITALS — BP 132/78 | HR 62 | Temp 97.6°F | Ht 72.0 in | Wt 202.2 lb

## 2013-02-03 DIAGNOSIS — E785 Hyperlipidemia, unspecified: Secondary | ICD-10-CM

## 2013-02-03 DIAGNOSIS — E119 Type 2 diabetes mellitus without complications: Secondary | ICD-10-CM | POA: Insufficient documentation

## 2013-02-03 DIAGNOSIS — Z0000 Encounter for general adult medical examination without abnormal findings: Secondary | ICD-10-CM

## 2013-02-03 DIAGNOSIS — I10 Essential (primary) hypertension: Secondary | ICD-10-CM

## 2013-02-03 HISTORY — DX: Type 2 diabetes mellitus without complications: E11.9

## 2013-02-03 MED ORDER — LABETALOL HCL 300 MG PO TABS: 300.0000 mg | ORAL_TABLET | Freq: Two times a day (BID) | ORAL | Status: DC

## 2013-02-03 MED ORDER — CLINDAMYCIN HCL 150 MG PO CAPS: ORAL_CAPSULE | ORAL | Status: DC

## 2013-02-03 NOTE — Progress Notes (Signed)
Subjective:    Patient ID: [name], male    DOB: 10-05-1946, 67 y.o.   MRN: [id number]  HPI  Here to f/u; overall doing ok,  Pt denies chest pain, increased sob or doe, wheezing, orthopnea, PND, increased LE swelling, palpitations, dizziness or syncope.  Pt denies polydipsia, polyuria, or low sugar symptoms such as weakness or confusion improved with po intake.  Pt denies new neurological symptoms such as new headache, or facial or extremity weakness or numbness.   Pt states overall good compliance with meds, has been trying to follow lower cholesterol, diabetic diet, with wt overall stable,  but little exercise however.  Asks for handicap parking form filled out.  Needs dental prophylaxis for coming dental cleaning and hx of knee replacement, and prior use of med, has pcn allergy.  BP at home has been mild elevated with average I estimate over 150, has taken BP at home at least 1-2 times per day for 2 months Past Medical History  Diagnosis Date  . ALLERGIC RHINITIS 11/06/2007  . BENIGN PROSTATIC HYPERTROPHY 07/14/2007  . COLONIC POLYPS, HX OF 07/14/2007  . GERD 07/11/2007  . Headache 07/11/2007  . HYPERLIPIDEMIA 07/11/2007  . HYPERTENSION 07/11/2007  . HYPERTHYROIDISM 07/14/2007  . HYPOTHYROIDISM 11/06/2007  . OSTEOARTHRITIS, KNEE, RIGHT 10/27/2008  . PERIPHERAL EDEMA 05/31/2009  . PULMONARY NODULE 06/24/2008    granuloma  . WOUND, LEG 05/31/2009  . Degenerative arthritis of right knee 12/12/2011  . Anemia, unspecified 08/22/2012  . Complication of anesthesia     pt states he needs to be cathed after surgery  . Type II or unspecified type diabetes mellitus without mention of complication, uncontrolled 08/22/2012    Patient states that he is not diabetic, not taking any medications   Past Surgical History  Procedure Laterality Date  . Neck surgery    . Nissen fundoplication  2000  . Back surgery  1986    x 2  1986 C 3  . Hernia repair  sept 1986  . Left knee replacement  Nov 21, 2007  .  Right heel bone spur  1987  . Total knee arthroplasty  01/09/2012    Procedure: TOTAL KNEE ARTHROPLASTY;  Surgeon: Jacki Cones, MD;  Location: [facility];  Service: Orthopedics;  Laterality: Right;  . Anterior cervical decomp/discectomy fusion N/A 01/06/2013    Procedure: ANTERIOR CERVICAL DECOMPRESSION/DISCECTOMY FUSION 1 LEVEL;  Surgeon: Maeola Harman, MD;  Location: [facility];  Service: Neurosurgery;  Laterality: N/A;  Cervical three-four Anterior cervical decompression/diskectomy/fusion    reports that he quit smoking about 30 years ago. His smoking use included Cigarettes. He has a 90 pack-year smoking history. He has never used smokeless tobacco. He reports that he does not drink alcohol or use illicit drugs. family history includes Heart disease in his other. Allergies  Allergen Reactions  . Adhesive (Tape) Other (See Comments)    Rash, reddness   Paper Tape: As far as pt knows, okay.  . Codeine Other (See Comments)    Headaches   . Naproxen Diarrhea  . Prednisone Diarrhea  . Zolpidem Tartrate Other (See Comments)    hallucinations  . Penicillins Rash    whelps   Current Outpatient Prescriptions on File Prior to Visit  Medication Sig Dispense Refill  . amLODipine (NORVASC) 5 MG tablet Take 1 tablet (5 mg total) by mouth daily.  90 tablet  3  . aspirin EC 325 MG tablet Take 1 tablet (325 mg total) by mouth daily.  30  tablet  12  . cetirizine (ZYRTEC) 10 MG tablet Take 10 mg by mouth daily.      . fenofibrate 160 MG tablet Take 1 tablet (160 mg total) by mouth daily.  90 tablet  3  . hydrochlorothiazide (HYDRODIURIL) 25 MG tablet Take 1 tablet (25 mg total) by mouth daily.  90 tablet  3  . levothyroxine (SYNTHROID, LEVOTHROID) 50 MCG tablet Take 1 tablet (50 mcg total) by mouth every morning.  90 tablet  3  . oxyCODONE-acetaminophen (PERCOCET/ROXICET) 5-325 MG per tablet Take 1-2 tablets by mouth every 4 (four) hours as needed for pain.  60 tablet  0  . pravastatin (PRAVACHOL)  20 MG tablet TAKE ONE TABLET IN THE EVENING  90 tablet  3   No current facility-administered medications on file prior to visit.   Review of Systems  Constitutional: Negative for unexpected weight change, or unusual diaphoresis  HENT: Negative for tinnitus.   Eyes: Negative for photophobia and visual disturbance.  Respiratory: Negative for choking and stridor.   Gastrointestinal: Negative for vomiting and blood in stool.  Genitourinary: Negative for hematuria and decreased urine volume.  Musculoskeletal: Negative for acute joint swelling Skin: Negative for color change and wound.  Neurological: Negative for tremors and numbness other than noted  Psychiatric/Behavioral: Negative for decreased concentration or  hyperactivity.       Objective:   Physical Exam BP 132/78  Pulse 62  Temp(Src) 97.6 F (36.4 C) (Oral)  Ht 6' (1.829 m)  Wt 202 lb 4 oz (91.74 kg)  BMI 27.42 kg/m2  SpO2 96% VS noted,  Constitutional: Pt appears well-developed and well-nourished.  HENT: Head: NCAT.  Right Ear: External ear normal.  Left Ear: External ear normal.  Eyes: Conjunctivae and EOM are normal. Pupils are equal, round, and reactive to light.  Neck: Normal range of motion. Neck supple.  Cardiovascular: Normal rate and regular rhythm.   Pulmonary/Chest: Effort normal and breath sounds normal.  Abd:  Soft, NT, non-distended, + BS Neurological: Pt is alert. Not confused  Skin: Skin is warm. No erythema.  Psychiatric: Pt behavior is normal. Thought content normal.     Assessment & Plan:

## 2013-02-03 NOTE — Patient Instructions (Addendum)
Please increase the labetolol to 300 mg twice per day OK for the clindamycin antibiotic for the dental procedure (sent to your pharmacy) Please continue all other medications as before, and refills have been done if requested. Please continue to monitor your Blood Pressure as you do. If the BP top number is over 140-150 after 1-2 weeks, we should consider adding Losartan Thank you for enrolling in MyChart. Please follow the instructions below to securely access your online medical record. MyChart allows you to send messages to your doctor, view your test results, renew your prescriptions, schedule appointments, and more. You are given the handicap form Please return in 6 months, or sooner if needed, with Lab testing done 3-5 days before

## 2013-02-03 NOTE — Assessment & Plan Note (Signed)
Mild uncontrolled, for increased labetolol to 300 bid, consider change or add ACE or ARB if needed in future given his evolving elevated BS

## 2013-02-03 NOTE — Assessment & Plan Note (Signed)
stable overall by history and exam,  and pt to continue medical treatment as before,  to f/u any worsening symptoms or concerns Lab Results  Component Value Date   CHOL 192 08/22/2012   HDL 39.10 08/22/2012   LDLDIRECT 113.9 08/22/2012   TRIG 212.0* 08/22/2012   CHOLHDL 5 08/22/2012

## 2013-02-03 NOTE — Assessment & Plan Note (Signed)
Mild increased recently but o/w stable overall by history and exam, recent data reviewed with pt, and pt to continue medical treatment as before - diet control,  to f/u any worsening symptoms or concerns Lab Results  Component Value Date   HGBA1C 6.7* 01/06/2013

## 2013-02-05 ENCOUNTER — Telehealth: Payer: Self-pay

## 2013-02-05 ENCOUNTER — Other Ambulatory Visit: Payer: Self-pay | Admitting: Internal Medicine

## 2013-02-05 MED ORDER — FENOFIBRATE 145 MG PO TABS: 145.0000 mg | ORAL_TABLET | Freq: Every day | ORAL | Status: DC

## 2013-02-05 NOTE — Telephone Encounter (Signed)
Done erx  - changed to the 145 mg

## 2013-02-05 NOTE — Telephone Encounter (Signed)
Pharmacy fax to inform they are unable to get fenofibrate 160 mg, would you  Consider changing to fenofibrate 135 or 145?

## 2013-02-20 ENCOUNTER — Ambulatory Visit: Payer: Medicare Other | Admitting: Internal Medicine

## 2013-03-31 ENCOUNTER — Other Ambulatory Visit: Payer: Self-pay | Admitting: *Deleted

## 2013-03-31 ENCOUNTER — Encounter: Payer: Self-pay | Admitting: Neurology

## 2013-03-31 ENCOUNTER — Ambulatory Visit (INDEPENDENT_AMBULATORY_CARE_PROVIDER_SITE_OTHER): Payer: Medicare Other | Admitting: Neurology

## 2013-03-31 VITALS — BP 160/80 | HR 54 | Temp 97.5°F | Ht 72.0 in | Wt 202.0 lb

## 2013-03-31 DIAGNOSIS — R269 Unspecified abnormalities of gait and mobility: Secondary | ICD-10-CM | POA: Insufficient documentation

## 2013-03-31 DIAGNOSIS — I6381 Other cerebral infarction due to occlusion or stenosis of small artery: Secondary | ICD-10-CM

## 2013-03-31 DIAGNOSIS — I6350 Cerebral infarction due to unspecified occlusion or stenosis of unspecified cerebral artery: Secondary | ICD-10-CM

## 2013-03-31 DIAGNOSIS — R42 Dizziness and giddiness: Secondary | ICD-10-CM

## 2013-03-31 DIAGNOSIS — M4802 Spinal stenosis, cervical region: Secondary | ICD-10-CM | POA: Insufficient documentation

## 2013-03-31 HISTORY — DX: Other cerebral infarction due to occlusion or stenosis of small artery: I63.81

## 2013-03-31 HISTORY — DX: Unspecified abnormalities of gait and mobility: R26.9

## 2013-03-31 HISTORY — DX: Spinal stenosis, cervical region: M48.02

## 2013-03-31 HISTORY — DX: Dizziness and giddiness: R42

## 2013-03-31 NOTE — Progress Notes (Signed)
[facility] [address] [place]. [place] [address] [phone]       OFFICE FOLLOW-UP NOTE  [name] Date of Birth:  1946-06-13 Medical Record Number:  [id number]   HPI: 67 year old Caucasian male with remote right brain subcortical infarct not visualized on MRI in July 2013 as well as long-standing gait imbalance difficulty from cervical myelopathy status post decompressive surgery and February 2014 He returns for followup after last visit on 12/30/12. He underwent cervical spine decompressive surgery and fusion by Dr. Venetia Maxon on 01/06/13. The surgery went well and he has noted improvement in neck pain as well as balance. He still has persistent left hand and leg weakness and spasticity which is unchanged. He is currently finishing outpatient physical therapy at [facility]. He continues to do well has not had any recurrent stroke or TIA symptoms. He states her blood pressures reasonably well-controlled and in the 140s at home though it is slightly elevated 160/80 in the office today. He had last lipid profile checked in September 2013 and it was borderline. He has no new complaints today. He is tolerating Plavix without bleeding, bruising or other side effects. ROS:   14 system review of systems is positive for gait difficulty balance problems and weakness  PMH:  Past Medical History  Diagnosis Date  . ALLERGIC RHINITIS 11/06/2007  . BENIGN PROSTATIC HYPERTROPHY 07/14/2007  . COLONIC POLYPS, HX OF 07/14/2007  . GERD 07/11/2007  . Headache 07/11/2007  . HYPERLIPIDEMIA 07/11/2007  . HYPERTENSION 07/11/2007  . HYPERTHYROIDISM 07/14/2007  . HYPOTHYROIDISM 11/06/2007  . OSTEOARTHRITIS, KNEE, RIGHT 10/27/2008  . PERIPHERAL EDEMA 05/31/2009  . PULMONARY NODULE 06/24/2008    granuloma  . WOUND, LEG 05/31/2009  . Degenerative arthritis of right knee 12/12/2011  . Anemia, unspecified 08/22/2012  . Complication of anesthesia     pt states he needs to be cathed after surgery   . Type II or unspecified type diabetes mellitus without mention of complication, uncontrolled 08/22/2012    Patient states that he is not diabetic, not taking any medications    Social History:  History   Social History  . Marital Status: Married    Spouse Name: N/A    Number of Children: N/A  . Years of Education: N/A   Occupational History  . Not on file.   Social History Main Topics  . Smoking status: Former Smoker -- 3.00 packs/day for 30 years    Types: Cigarettes    Quit date: 11/26/1982  . Smokeless tobacco: Never Used  . Alcohol Use: No  . Drug Use: No  . Sexually Active: Not on file   Other Topics Concern  . Not on file   Social History Narrative  . No narrative on file    Medications:   Current Outpatient Prescriptions on File Prior to Visit  Medication Sig Dispense Refill  . amLODipine (NORVASC) 5 MG tablet Take 1 tablet (5 mg total) by mouth daily.  90 tablet  3  . cetirizine (ZYRTEC) 10 MG tablet Take 10 mg by mouth daily.      . clindamycin (CLEOCIN) 150 MG capsule 1 tab by mouth1 hour prior to dental, then 1 tab at one hour after  2 capsule  0  . fenofibrate (TRICOR) 145 MG tablet Take 1 tablet (145 mg total) by mouth daily.  90 tablet  3  . hydrochlorothiazide (HYDRODIURIL) 25 MG tablet Take 1 tablet (25 mg total) by mouth daily.  90 tablet  3  . labetalol (  NORMODYNE) 300 MG tablet Take 1 tablet (300 mg total) by mouth 2 (two) times daily.  90 tablet  3  . levothyroxine (SYNTHROID, LEVOTHROID) 50 MCG tablet Take 1 tablet (50 mcg total) by mouth every morning.  90 tablet  3  . pravastatin (PRAVACHOL) 20 MG tablet TAKE ONE TABLET IN THE EVENING  90 tablet  3   No current facility-administered medications on file prior to visit.    Allergies:   Allergies  Allergen Reactions  . Adhesive (Tape) Other (See Comments)    Rash, reddness   Paper Tape: As far as pt knows, okay.  . Codeine Other (See Comments)    Headaches   . Naproxen Diarrhea  .  Prednisone Diarrhea  . Zolpidem Tartrate Other (See Comments)    hallucinations  . Penicillins Rash    whelps   Filed Vitals:   03/31/13 1558  BP: 160/80  Pulse: 54  Temp: 97.5 F (36.4 C)    Physical Exam General: well developed, well nourished, seated, in no evident distress Head: head normocephalic and atraumatic. Orohparynx benign Neck: supple with no carotid or supraclavicular bruits. Scar from neck surgery . Cardiovascular: regular rate and rhythm, no murmurs Musculoskeletal: no deformity Skin:  no rash/petichiae Vascular:  Normal pulses all extremities  Neurologic Exam Mental Status: Awake and fully alert. Oriented to place and time. Recent and remote memory intact. Attention span, concentration and fund of knowledge appropriate. Mood and affect appropriate.  Cranial Nerves: Fundoscopic exam reveals sharp disc margins. Pupils equal, briskly reactive to light. Extraocular movements full without nystagmus. Visual fields full to confrontation. Hearing intact. Facial sensation intact. Face, tongue, palate moves normally and symmetrically.  Motor: Normal bulk and tone. Normal strength in all tested extremity muscles except mild left grip weakness and diminished fine finger movements on the left. Orbits right over left approximately. Increased tone in the left lower extremity with spasticity.. Sensory.: intact to tough and pinprick and vibratory.  Coordination: Rapid alternating movements normal in all extremities. Finger-to-nose and heel-to-shin performed accurately bilaterally. Gait and Station: Arises from chair without difficulty. Stance is normal. Gait demonstrates stiffness and spasticity of the left leg. Unable to do tandem walking.  Reflexes: 2+ on the left and 1+ on the right  . Toes downgoing.     ASSESSMENT:  67 year old Caucasian male with remote right brain subcortical infarct not visualized on MRI in July 2013 as well as long-standing gait imbalance difficulty from  cervical myelopathy status post decompressive surgery and February 2014   PLAN: Continue Plavix for stroke prevention strict control of hypertension with blood pressure goal below 130/90 and lipids with LDL cholesterol goal below 100 mg percent. Continue physical therapy for gait and balance and follow up with Dr. Venetia Maxon neurosurgeon for that. Check followup carotid ultrasound study. Return for followup in 6 months  With Larita Fife, NP.

## 2013-03-31 NOTE — Patient Instructions (Addendum)
Check followup carotid ultrasound study. Continue Plavix for stroke prevention for strict control of hypertension with blood pressure goal below 130/90 and lipids with LDL cholesterol goal below 100 mg percent. Continue outpatient physical therapy for gait and balance training and follow up with Dr. Venetia Maxon. Return for followup in 6 months with Larita Fife, NP

## 2013-04-02 ENCOUNTER — Ambulatory Visit (INDEPENDENT_AMBULATORY_CARE_PROVIDER_SITE_OTHER): Payer: Medicare Other

## 2013-04-02 DIAGNOSIS — I6381 Other cerebral infarction due to occlusion or stenosis of small artery: Secondary | ICD-10-CM

## 2013-04-02 DIAGNOSIS — I6350 Cerebral infarction due to unspecified occlusion or stenosis of unspecified cerebral artery: Secondary | ICD-10-CM

## 2013-04-06 ENCOUNTER — Telehealth: Payer: Self-pay

## 2013-04-06 NOTE — Telephone Encounter (Signed)
Phone call from [place] at [facility] [phone] following up on a medication therapy recommendation form that was faxed and has not been received. Can the [phone] number if it needs to be sent again.

## 2013-04-06 NOTE — Telephone Encounter (Signed)
Called [employer] and they are refaxing form

## 2013-06-04 ENCOUNTER — Other Ambulatory Visit: Payer: Self-pay

## 2013-06-04 MED ORDER — CLOPIDOGREL BISULFATE 75 MG PO TABS: 75.0000 mg | ORAL_TABLET | Freq: Every day | ORAL | Status: DC

## 2013-06-10 ENCOUNTER — Ambulatory Visit: Payer: Medicare Other | Admitting: Internal Medicine

## 2013-08-03 ENCOUNTER — Other Ambulatory Visit (INDEPENDENT_AMBULATORY_CARE_PROVIDER_SITE_OTHER): Payer: Medicare Other

## 2013-08-03 DIAGNOSIS — Z125 Encounter for screening for malignant neoplasm of prostate: Secondary | ICD-10-CM

## 2013-08-03 DIAGNOSIS — Z0000 Encounter for general adult medical examination without abnormal findings: Secondary | ICD-10-CM

## 2013-08-03 LAB — CBC WITH DIFFERENTIAL/PLATELET
Basophils Relative: 0.5 % (ref 0.0–3.0)
Eosinophils Relative: 4.9 % (ref 0.0–5.0)
Hemoglobin: 14.7 g/dL (ref 13.0–17.0)
Lymphocytes Relative: 21.3 % (ref 12.0–46.0)
MCHC: 34 g/dL (ref 30.0–36.0)
MCV: 89.8 fl (ref 78.0–100.0)
Monocytes Absolute: 0.7 10*3/uL (ref 0.1–1.0)
Neutro Abs: 5.7 10*3/uL (ref 1.4–7.7)
Neutrophils Relative %: 64.9 % (ref 43.0–77.0)
RBC: 4.82 Mil/uL (ref 4.22–5.81)
WBC: 8.8 10*3/uL (ref 4.5–10.5)

## 2013-08-03 LAB — BASIC METABOLIC PANEL
BUN: 19 mg/dL (ref 6–23)
Chloride: 104 mEq/L (ref 96–112)
Potassium: 3.8 mEq/L (ref 3.5–5.1)

## 2013-08-03 LAB — HEPATIC FUNCTION PANEL
ALT: 20 U/L (ref 0–53)
AST: 21 U/L (ref 0–37)
Alkaline Phosphatase: 42 U/L (ref 39–117)
Bilirubin, Direct: 0.1 mg/dL (ref 0.0–0.3)
Total Bilirubin: 0.7 mg/dL (ref 0.3–1.2)

## 2013-08-03 LAB — URINALYSIS, ROUTINE W REFLEX MICROSCOPIC
Nitrite: NEGATIVE
Total Protein, Urine: NEGATIVE
Urine Glucose: NEGATIVE
pH: 6 (ref 5.0–8.0)

## 2013-08-03 LAB — HEMOGLOBIN A1C: Hgb A1c MFr Bld: 6.5 % (ref 4.6–6.5)

## 2013-08-03 LAB — LIPID PANEL: VLDL: 86.2 mg/dL — ABNORMAL HIGH (ref 0.0–40.0)

## 2013-08-03 LAB — PSA: PSA: 0.74 ng/mL (ref 0.10–4.00)

## 2013-08-06 ENCOUNTER — Encounter: Payer: Self-pay | Admitting: Internal Medicine

## 2013-08-06 ENCOUNTER — Ambulatory Visit (INDEPENDENT_AMBULATORY_CARE_PROVIDER_SITE_OTHER): Payer: Medicare Other | Admitting: Internal Medicine

## 2013-08-06 VITALS — BP 120/70 | HR 53 | Temp 98.8°F | Ht 72.0 in | Wt 201.5 lb

## 2013-08-06 DIAGNOSIS — Z23 Encounter for immunization: Secondary | ICD-10-CM

## 2013-08-06 DIAGNOSIS — Z0000 Encounter for general adult medical examination without abnormal findings: Secondary | ICD-10-CM

## 2013-08-06 MED ORDER — PRAVASTATIN SODIUM 40 MG PO TABS: 40.0000 mg | ORAL_TABLET | Freq: Every day | ORAL | Status: DC

## 2013-08-06 NOTE — Assessment & Plan Note (Signed)

## 2013-08-06 NOTE — Progress Notes (Signed)
Subjective:    Patient ID: [name], male    DOB: 21-Jul-1946, 67 y.o.   MRN: [id number]  HPI  Here for wellness and f/u;  Overall doing ok;  Pt denies CP, worsening SOB, DOE, wheezing, orthopnea, PND, worsening LE edema, palpitations, dizziness or syncope.  Pt denies neurological change such as new headache, facial or extremity weakness.  Pt denies polydipsia, polyuria, or low sugar symptoms. Pt states overall good compliance with treatment and medications, good tolerability, and has been trying to follow lower cholesterol diet.  Pt denies worsening depressive symptoms, suicidal ideation or panic. No fever, night sweats, wt loss, loss of appetite, or other constitutional symptoms.  Pt states good ability with ADL's, has low fall risk, home safety reviewed and adequate, no other significant changes in hearing or vision, and only occasionally active with exercise. S/p cva, chornic balance impairment, not expected to improve per Dr Venetia Maxon. For flu shot today.  Wt down from 225 to 201 today with better diet. Past Medical History  Diagnosis Date  . ALLERGIC RHINITIS 11/06/2007  . BENIGN PROSTATIC HYPERTROPHY 07/14/2007  . COLONIC POLYPS, HX OF 07/14/2007  . GERD 07/11/2007  . Headache(784.0) 07/11/2007  . HYPERLIPIDEMIA 07/11/2007  . HYPERTENSION 07/11/2007  . HYPERTHYROIDISM 07/14/2007  . HYPOTHYROIDISM 11/06/2007  . OSTEOARTHRITIS, KNEE, RIGHT 10/27/2008  . PERIPHERAL EDEMA 05/31/2009  . PULMONARY NODULE 06/24/2008    granuloma  . WOUND, LEG 05/31/2009  . Degenerative arthritis of right knee 12/12/2011  . Anemia, unspecified 08/22/2012  . Complication of anesthesia     pt states he needs to be cathed after surgery  . Type II or unspecified type diabetes mellitus without mention of complication, uncontrolled 08/22/2012    Patient states that he is not diabetic, not taking any medications   Past Surgical History  Procedure Laterality Date  . Neck surgery    . Nissen fundoplication  2000  . Back  surgery  1986    x 2  1986 C 3  . Hernia repair  sept 1986  . Left knee replacement  Nov 21, 2007  . Right heel bone spur  1987  . Total knee arthroplasty  01/09/2012    Procedure: TOTAL KNEE ARTHROPLASTY;  Surgeon: Jacki Cones, MD;  Location: [facility];  Service: Orthopedics;  Laterality: Right;  . Anterior cervical decomp/discectomy fusion N/A 01/06/2013    Procedure: ANTERIOR CERVICAL DECOMPRESSION/DISCECTOMY FUSION 1 LEVEL;  Surgeon: Maeola Harman, MD;  Location: [facility];  Service: Neurosurgery;  Laterality: N/A;  Cervical three-four Anterior cervical decompression/diskectomy/fusion    reports that he quit smoking about 30 years ago. His smoking use included Cigarettes. He has a 90 pack-year smoking history. He has never used smokeless tobacco. He reports that he does not drink alcohol or use illicit drugs. family history includes Heart disease in his other. Allergies  Allergen Reactions  . Adhesive [Tape] Other (See Comments)    Rash, reddness   Paper Tape: As far as pt knows, okay.  . Codeine Other (See Comments)    Headaches   . Naproxen Diarrhea  . Prednisone Diarrhea  . Zolpidem Tartrate Other (See Comments)    hallucinations  . Penicillins Rash    whelps   Current Outpatient Prescriptions on File Prior to Visit  Medication Sig Dispense Refill  . amLODipine (NORVASC) 5 MG tablet Take 1 tablet (5 mg total) by mouth daily.  90 tablet  3  . cetirizine (ZYRTEC) 10 MG tablet Take 10 mg by mouth daily.      Marland Kitchen  clindamycin (CLEOCIN) 150 MG capsule 1 tab by mouth1 hour prior to dental, then 1 tab at one hour after  2 capsule  0  . clopidogrel (PLAVIX) 75 MG tablet Take 1 tablet (75 mg total) by mouth daily.  90 tablet  1  . fenofibrate (TRICOR) 145 MG tablet Take 1 tablet (145 mg total) by mouth daily.  90 tablet  3  . hydrochlorothiazide (HYDRODIURIL) 25 MG tablet Take 1 tablet (25 mg total) by mouth daily.  90 tablet  3  . labetalol (NORMODYNE) 300 MG tablet Take 1 tablet  (300 mg total) by mouth 2 (two) times daily.  90 tablet  3  . levothyroxine (SYNTHROID, LEVOTHROID) 50 MCG tablet Take 1 tablet (50 mcg total) by mouth every morning.  90 tablet  3   No current facility-administered medications on file prior to visit.   Review of Systems Constitutional: Negative for diaphoresis, activity change, appetite change or unexpected weight change.  HENT: Negative for hearing loss, ear pain, facial swelling, mouth sores and neck stiffness.   Eyes: Negative for pain, redness and visual disturbance.  Respiratory: Negative for shortness of breath and wheezing.   Cardiovascular: Negative for chest pain and palpitations.  Gastrointestinal: Negative for diarrhea, blood in stool, abdominal distention or other pain Genitourinary: Negative for hematuria, flank pain or change in urine volume.  Musculoskeletal: Negative for myalgias and joint swelling.  Skin: Negative for color change and wound.  Neurological: Negative for syncope and numbness. other than noted Hematological: Negative for adenopathy.  Psychiatric/Behavioral: Negative for hallucinations, self-injury, decreased concentration and agitation.      Objective:   Physical Exam BP 120/70  Pulse 53  Temp(Src) 98.8 F (37.1 C) (Oral)  Ht 6' (1.829 m)  Wt 201 lb 8 oz (91.4 kg)  BMI 27.32 kg/m2  SpO2 95% VS noted,  Constitutional: Pt is oriented to person, place, and time. Appears well-developed and well-nourished.  Head: Normocephalic and atraumatic.  Right Ear: External ear normal.  Left Ear: External ear normal.  Nose: Nose normal.  Mouth/Throat: Oropharynx is clear and moist.  Eyes: Conjunctivae and EOM are normal. Pupils are equal, round, and reactive to light.  Neck: Normal range of motion. Neck supple. No JVD present. No tracheal deviation present.  Cardiovascular: Normal rate, regular rhythm, normal heart sounds and intact distal pulses.   Pulmonary/Chest: Effort normal and breath sounds normal.   Abdominal: Soft. Bowel sounds are normal. There is no tenderness. No HSM  Musculoskeletal: Normal range of motion. Exhibits no edema.  Lymphadenopathy:  Has no cervical adenopathy.  Neurological: Pt is alert and oriented to person, place, and time. Pt has normal reflexes. No cranial nerve deficit. Has some exec function deficit related to prior stroke Skin: Skin is warm and dry. No rash noted.  Psychiatric:  Has  normal mood and affect. Behavior is normal.      Assessment & Plan:

## 2013-08-06 NOTE — Patient Instructions (Addendum)
You had the flu shot today Please change the pravastatin to 40 mg per day Please continue all other medications as before, and refills have been done if requested. Please have the pharmacy call with any other refills you may need. You will be contacted regarding the referral for: GI to see about getting the colonoscopy done  Please remember to sign up for My Chart if you have not done so, as this will be important to you in the future with finding out test results, communicating by private email, and scheduling acute appointments online when needed.  Please return in 6 months, or sooner if needed, with Lab testing done 3-5 days before

## 2013-08-08 NOTE — Assessment & Plan Note (Signed)
stable overall by history and exam, recent data reviewed with pt, and pt to continue medical treatment as before,  to f/u any worsening symptoms or concerns Lab Results  Component Value Date   HGBA1C 6.5 08/03/2013    

## 2013-08-10 ENCOUNTER — Other Ambulatory Visit: Payer: Self-pay | Admitting: Internal Medicine

## 2013-08-10 NOTE — Telephone Encounter (Signed)
Refill done.  

## 2013-09-08 ENCOUNTER — Other Ambulatory Visit: Payer: Self-pay | Admitting: Internal Medicine

## 2013-10-01 ENCOUNTER — Encounter: Payer: Self-pay | Admitting: Nurse Practitioner

## 2013-10-01 ENCOUNTER — Ambulatory Visit (INDEPENDENT_AMBULATORY_CARE_PROVIDER_SITE_OTHER): Payer: Medicare Other | Admitting: Nurse Practitioner

## 2013-10-01 VITALS — BP 148/82 | HR 52 | Temp 97.8°F | Ht 72.0 in | Wt 208.0 lb

## 2013-10-01 DIAGNOSIS — I6350 Cerebral infarction due to unspecified occlusion or stenosis of unspecified cerebral artery: Secondary | ICD-10-CM

## 2013-10-01 DIAGNOSIS — I6381 Other cerebral infarction due to occlusion or stenosis of small artery: Secondary | ICD-10-CM

## 2013-10-01 DIAGNOSIS — R269 Unspecified abnormalities of gait and mobility: Secondary | ICD-10-CM

## 2013-10-01 DIAGNOSIS — M4802 Spinal stenosis, cervical region: Secondary | ICD-10-CM

## 2013-10-01 DIAGNOSIS — R42 Dizziness and giddiness: Secondary | ICD-10-CM

## 2013-10-01 MED ORDER — CLOPIDOGREL BISULFATE 75 MG PO TABS: 75.0000 mg | ORAL_TABLET | Freq: Every day | ORAL | Status: DC

## 2013-10-01 NOTE — Patient Instructions (Addendum)
PLAN:  Continue Plavix for stroke prevention strict control of hypertension with blood pressure goal below 130/90 and lipids with LDL cholesterol goal below 100 mg percent.   Return for followup in 6 months With Larita Fife, NP.    STROKE/TIA INSTRUCTIONS SMOKING Cigarette smoking nearly doubles your risk of having a stroke & is the single most alterable risk factor  If you smoke or have smoked in the last 12 months, you are advised to quit smoking for your health.  Most of the excess cardiovascular risk related to smoking disappears within a year of stopping.  Ask you doctor about anti-smoking medications  [place] Quit Line: 1-800-QUIT NOW  Free Smoking Cessation Classes ([phone]  CHOLESTEROL Know your levels; limit fat & cholesterol in your diet  Lab Results  Component Value Date   CHOL 220* 08/03/2013   HDL 38.80* 08/03/2013   LDLDIRECT 129.0 08/03/2013   TRIG 431.0* 08/03/2013   CHOLHDL 6 08/03/2013      Many patients benefit from treatment even if their cholesterol is at goal.  Goal: Total Cholesterol less than 160  Goal:  LDL less than 100  Goal:  HDL greater than 40  Goal:  Triglycerides less than 150  BLOOD PRESSURE American Stroke Association blood pressure target is less that 120/80 mm/Hg  Your discharge blood pressure is:  BP: 148/82 mmHg  Monitor your blood pressure  Limit your salt and alcohol intake  Many individuals will require more than one medication for high blood pressure  DIABETES (A1c is a blood sugar average for last 3 months) Goal A1c is under 7% (A1c is blood sugar average for last 3 months)  Diabetes: No known diagnosis of diabetes    Lab Results  Component Value Date   HGBA1C 6.5 08/03/2013    Your A1c can be lowered with medications, healthy diet, and exercise.  Check your blood sugar as directed by your physician  Call your physician if you experience unexplained or low blood sugars.  PHYSICAL ACTIVITY/REHABILITATION Goal is 30 minutes at least 4 days  per week    Activity decreases your risk of heart attack and stroke and makes your heart stronger.  It helps control your weight and blood pressure; helps you relax and can improve your mood.  Participate in a regular exercise program.  Talk with your doctor about the best form of exercise for you (dancing, walking, swimming, cycling).  DIET/WEIGHT Goal is to maintain a healthy weight  Your height is:  Height: 6' (182.9 cm) Your current weight is: Weight: 208 lb (94.348 kg) Your body Mass Index (BMI) is:  BMI (Calculated): 28.3  Following the type of diet specifically designed for you will help prevent another stroke.  Your goal Body Mass Index (BMI) is 19-24.  Healthy food habits can help reduce 3 risk factors for stroke:  High cholesterol, hypertension, and excess weight.

## 2013-10-01 NOTE — Progress Notes (Signed)
[facility]  PATIENT: [name] DOB: 09/26/1946   REASON FOR VISIT: follow up HISTORY FROM: patient  HISTORY OF PRESENT ILLNESS: 67 year old Caucasian male with remote right brain subcortical infarct not visualized on MRI in July 2013 as well as long-standing gait imbalance difficulty from cervical myelopathy status post decompressive surgery and February 2014.  03/31/13 (PS):  He returns for followup after last visit on 12/30/12. He underwent cervical spine decompressive surgery and fusion by Dr. Venetia Maxon on 01/06/13. The surgery went well and he has noted improvement in neck pain as well as balance. He still has persistent left hand and leg weakness and spasticity which is unchanged. He is currently finishing outpatient physical therapy at [facility]. He continues to do well has not had any recurrent stroke or TIA symptoms. He states her blood pressures reasonably well-controlled and in the 140s at home though it is slightly elevated 160/80 in the office today. He had last lipid profile checked in September 2013 and it was borderline. He has no new complaints today. He is tolerating Plavix without bleeding, bruising or other side effects.   UPDATE 10/01/13 (LL): Patient returns to office for stroke follow up visit.  Repeat carotid dopplers on 04/02/13 did not show any hemodynamically significant stenosis.  He is tolerating Plavix without bleeding, bruising or other side effects.  He is doing well, still having right arm numbness after cervical surgery, but has full ROM and full strength.  His last lipid panel in September was high, and Dr. Fayrene Fearing doubled his statin.  It has not been rechecked since then.  He is a little unsteady on his feet at times, but denies any falls.  ROS:  14 system review of systems is positive for gait difficulty balance problems and right arm numbness/weakness.  ALLERGIES: Allergies  Allergen Reactions  . Adhesive [Tape] Other (See Comments)   Rash, reddness   Paper Tape: As far as pt knows, okay.  . Codeine Other (See Comments)    Headaches   . Naproxen Diarrhea  . Prednisone Diarrhea  . Zolpidem Tartrate Other (See Comments)    hallucinations  . Penicillins Rash    whelps    HOME MEDICATIONS: Outpatient Prescriptions Prior to Visit  Medication Sig Dispense Refill  . amLODipine (NORVASC) 5 MG tablet Take 1 tablet (5 mg total) by mouth daily.  90 tablet  3  . cetirizine (ZYRTEC) 10 MG tablet Take 10 mg by mouth daily.      . fenofibrate (TRICOR) 145 MG tablet Take 1 tablet (145 mg total) by mouth daily.  90 tablet  3  . hydrochlorothiazide (HYDRODIURIL) 25 MG tablet Take 1 tablet (25 mg total) by mouth daily.  90 tablet  3  . labetalol (NORMODYNE) 300 MG tablet TAKE ONE TABLET BY MOUTH TWICE DAILY  180 tablet  1  . levothyroxine (SYNTHROID, LEVOTHROID) 50 MCG tablet TAKE ONE TABLET BY MOUTH IN THE MORNING  90 tablet  3  . pravastatin (PRAVACHOL) 40 MG tablet Take 1 tablet (40 mg total) by mouth daily.  90 tablet  3  . clindamycin (CLEOCIN) 150 MG capsule 1 tab by mouth1 hour prior to dental, then 1 tab at one hour after  2 capsule  0  . clopidogrel (PLAVIX) 75 MG tablet Take 1 tablet (75 mg total) by mouth daily.  90 tablet  1   No facility-administered medications prior to visit.    PAST MEDICAL HISTORY: Past Medical History  Diagnosis Date  . ALLERGIC  RHINITIS 11/06/2007  . BENIGN PROSTATIC HYPERTROPHY 07/14/2007  . COLONIC POLYPS, HX OF 07/14/2007  . GERD 07/11/2007  . Headache(784.0) 07/11/2007  . HYPERLIPIDEMIA 07/11/2007  . HYPERTENSION 07/11/2007  . HYPERTHYROIDISM 07/14/2007  . HYPOTHYROIDISM 11/06/2007  . OSTEOARTHRITIS, KNEE, RIGHT 10/27/2008  . PERIPHERAL EDEMA 05/31/2009  . PULMONARY NODULE 06/24/2008    granuloma  . WOUND, LEG 05/31/2009  . Degenerative arthritis of right knee 12/12/2011  . Anemia, unspecified 08/22/2012  . Complication of anesthesia     pt states he needs to be cathed after surgery  . Type  II or unspecified type diabetes mellitus without mention of complication, uncontrolled 08/22/2012    Patient states that he is not diabetic, not taking any medications    PAST SURGICAL HISTORY: Past Surgical History  Procedure Laterality Date  . Neck surgery    . Nissen fundoplication  2000  . Back surgery  1986    x 2  1986 C 3  . Hernia repair  sept 1986  . Left knee replacement  Nov 21, 2007  . Right heel bone spur  1987  . Total knee arthroplasty  01/09/2012    Procedure: TOTAL KNEE ARTHROPLASTY;  Surgeon: Jacki Cones, MD;  Location: [facility];  Service: Orthopedics;  Laterality: Right;  . Anterior cervical decomp/discectomy fusion N/A 01/06/2013    Procedure: ANTERIOR CERVICAL DECOMPRESSION/DISCECTOMY FUSION 1 LEVEL;  Surgeon: Maeola Harman, MD;  Location: [facility];  Service: Neurosurgery;  Laterality: N/A;  Cervical three-four Anterior cervical decompression/diskectomy/fusion    FAMILY HISTORY: Family History  Problem Relation Age of Onset  . Heart disease Other   . Heart attack Mother   . Heart attack Father   . Emphysema Father     SOCIAL HISTORY: History   Social History  . Marital Status: Married    Spouse Name: Eber Jones    Number of Children: 2  . Years of Education: HS   Occupational History  . Retired    Social History Main Topics  . Smoking status: Former Smoker -- 3.00 packs/day for 30 years    Types: Cigarettes    Quit date: 11/26/1982  . Smokeless tobacco: Never Used  . Alcohol Use: No  . Drug Use: No  . Sexual Activity: Not on file   Other Topics Concern  . Not on file   Social History Narrative   Patient lives at home with spouse.   Caffiene Use: 4 cups daily     PHYSICAL EXAM  Filed Vitals:   10/01/13 1324  BP: 148/82  Pulse: 52  Temp: 97.8 F (36.6 C)  TempSrc: Oral  Height: 6' (1.829 m)  Weight: 208 lb (94.348 kg)   Body mass index is 28.2 kg/(m^2).  Physical Exam  General: well developed, well nourished, seated, in no  evident distress  Head: head normocephalic and atraumatic. Orohparynx benign  Neck: supple with no carotid or supraclavicular bruits. Scar from neck surgery .  Cardiovascular: regular rate and rhythm, no murmurs  Musculoskeletal: no deformity  Skin: no rash/petichiae  Vascular: Normal pulses all extremities   Neurologic Exam  Mental Status: Awake and fully alert. Oriented to place and time. Recent and remote memory intact. Attention span, concentration and fund of knowledge appropriate. Mood and affect appropriate.  Cranial Nerves: Fundoscopic exam reveals sharp disc margins. Pupils equal, briskly reactive to light. Extraocular movements full without nystagmus. Visual fields full to confrontation. Hearing intact. Facial sensation intact. Face, tongue, palate moves normally and symmetrically.  Motor: Normal bulk and  tone. Normal strength in all tested extremity muscles except mild left grip weakness and diminished fine finger movements on the left. Orbits right over left approximately. Increased tone in the left lower extremity with spasticity. Sensory.: intact to tough and pinprick and vibratory.  Coordination: Rapid alternating movements normal in all extremities. Finger-to-nose and heel-to-shin performed accurately bilaterally.  Gait and Station: Arises from chair without difficulty. Stance is normal. Gait demonstrates stiffness and spasticity of the left leg.  Tandem walking is unsteady, can toe walk and heel walk. Romberg negative. Reflexes: 2+ on the left and 1+ on the right . Toes downgoing.   DIAGNOSTIC DATA (LABS, IMAGING, TESTING) - I reviewed patient records, labs, notes, testing and imaging myself where available.  Lab Results  Component Value Date   WBC 8.8 08/03/2013   HGB 14.7 08/03/2013   HCT 43.3 08/03/2013   MCV 89.8 08/03/2013   PLT 223.0 08/03/2013      Component Value Date/Time   NA 142 08/03/2013 1416   K 3.8 08/03/2013 1416   CL 104 08/03/2013 1416   CO2 30 08/03/2013 1416    GLUCOSE 124* 08/03/2013 1416   BUN 19 08/03/2013 1416   CREATININE 1.2 08/03/2013 1416   CALCIUM 10.3 08/03/2013 1416   PROT 7.4 08/03/2013 1416   ALBUMIN 4.6 08/03/2013 1416   AST 21 08/03/2013 1416   ALT 20 08/03/2013 1416   ALKPHOS 42 08/03/2013 1416   BILITOT 0.7 08/03/2013 1416   GFRNONAA 67* 12/31/2012 1422   GFRAA 78* 12/31/2012 1422   Lab Results  Component Value Date   CHOL 220* 08/03/2013   HDL 38.80* 08/03/2013   LDLDIRECT 129.0 08/03/2013   TRIG 431.0* 08/03/2013   CHOLHDL 6 08/03/2013   Lab Results  Component Value Date   HGBA1C 6.5 08/03/2013   No results found for this basename: VITAMINB12   Lab Results  Component Value Date   TSH 7.27* 08/03/2013    ASSESSMENT AND PLAN 67 year old Caucasian male with remote right brain subcortical infarct not visualized on MRI in July 2013 as well as long-standing gait imbalance difficulty from cervical myelopathy status post decompressive surgery and February 2014.  PLAN:  Continue Plavix for stroke prevention strict control of hypertension with blood pressure goal below 130/90 and lipids with LDL cholesterol goal below 100 mg percent.   Return for followup in 6 months With Larita Fife, NP.   Meds ordered this encounter  Medications  . clopidogrel (PLAVIX) 75 MG tablet    Sig: Take 1 tablet (75 mg total) by mouth daily.    Dispense:  90 tablet    Refill:  3    Order Specific Question:  Supervising Provider    Answer:  Patterson Hammersmith Francis Doenges, MSN, NP-C 10/01/2013, 2:09 PM [facility] [address] [place], [place] [address] [phone]  Note: This document was prepared with digital dictation and possible smart phrase technology. Any transcriptional errors that result from this process are unintentional.

## 2013-10-05 ENCOUNTER — Ambulatory Visit: Payer: Medicare Other | Admitting: Neurology

## 2013-11-09 ENCOUNTER — Other Ambulatory Visit: Payer: Self-pay | Admitting: Internal Medicine

## 2013-11-13 ENCOUNTER — Other Ambulatory Visit: Payer: Self-pay | Admitting: Internal Medicine

## 2014-02-03 ENCOUNTER — Encounter: Payer: Self-pay | Admitting: Internal Medicine

## 2014-02-03 ENCOUNTER — Other Ambulatory Visit (INDEPENDENT_AMBULATORY_CARE_PROVIDER_SITE_OTHER): Payer: Medicare Other

## 2014-02-03 ENCOUNTER — Ambulatory Visit (INDEPENDENT_AMBULATORY_CARE_PROVIDER_SITE_OTHER): Payer: Medicare Other | Admitting: Internal Medicine

## 2014-02-03 VITALS — BP 118/78 | HR 52 | Temp 97.6°F | Ht 72.0 in | Wt 205.5 lb

## 2014-02-03 DIAGNOSIS — Z125 Encounter for screening for malignant neoplasm of prostate: Secondary | ICD-10-CM

## 2014-02-03 DIAGNOSIS — Z0000 Encounter for general adult medical examination without abnormal findings: Secondary | ICD-10-CM

## 2014-02-03 DIAGNOSIS — IMO0001 Reserved for inherently not codable concepts without codable children: Secondary | ICD-10-CM

## 2014-02-03 DIAGNOSIS — E1165 Type 2 diabetes mellitus with hyperglycemia: Principal | ICD-10-CM

## 2014-02-03 DIAGNOSIS — E785 Hyperlipidemia, unspecified: Secondary | ICD-10-CM

## 2014-02-03 DIAGNOSIS — Z23 Encounter for immunization: Secondary | ICD-10-CM

## 2014-02-03 DIAGNOSIS — E039 Hypothyroidism, unspecified: Secondary | ICD-10-CM

## 2014-02-03 DIAGNOSIS — I10 Essential (primary) hypertension: Secondary | ICD-10-CM

## 2014-02-03 LAB — BASIC METABOLIC PANEL
BUN: 15 mg/dL (ref 6–23)
CALCIUM: 10 mg/dL (ref 8.4–10.5)
CO2: 30 mEq/L (ref 19–32)
CREATININE: 1.3 mg/dL (ref 0.4–1.5)
Chloride: 102 mEq/L (ref 96–112)
GFR: 59.6 mL/min — ABNORMAL LOW (ref >60.00)
Glucose, Bld: 134 mg/dL — ABNORMAL HIGH (ref 70–99)
Potassium: 3.9 mEq/L (ref 3.5–5.1)
Sodium: 141 mEq/L (ref 135–145)

## 2014-02-03 LAB — PSA: PSA: 1.57 ng/mL (ref 0.10–4.00)

## 2014-02-03 LAB — LIPID PANEL
CHOL/HDL RATIO: 5
Cholesterol: 206 mg/dL — ABNORMAL HIGH (ref 0–200)
HDL: 42.4 mg/dL (ref >39.00)
LDL Cholesterol: 115 mg/dL — ABNORMAL HIGH (ref 0–99)
TRIGLYCERIDES: 244 mg/dL — AB (ref 0.0–149.0)
VLDL: 48.8 mg/dL — ABNORMAL HIGH (ref 0.0–40.0)

## 2014-02-03 LAB — URINALYSIS, ROUTINE W REFLEX MICROSCOPIC
BILIRUBIN URINE: NEGATIVE
HGB URINE DIPSTICK: NEGATIVE
Ketones, ur: NEGATIVE
Leukocytes, UA: NEGATIVE
Nitrite: NEGATIVE
RBC / HPF: NONE SEEN (ref 0–?)
Specific Gravity, Urine: 1.02 (ref 1.000–1.030)
TOTAL PROTEIN, URINE-UPE24: NEGATIVE
Urine Glucose: NEGATIVE
Urobilinogen, UA: 0.2 (ref 0.0–1.0)
WBC, UA: NONE SEEN (ref 0–?)
pH: 6 (ref 5.0–8.0)

## 2014-02-03 LAB — CBC WITH DIFFERENTIAL/PLATELET
BASOS PCT: 0.6 % (ref 0.0–3.0)
Basophils Absolute: 0 10*3/uL (ref 0.0–0.1)
EOS ABS: 0.6 10*3/uL (ref 0.0–0.7)
Eosinophils Relative: 6.8 % — ABNORMAL HIGH (ref 0.0–5.0)
HCT: 44.7 % (ref 39.0–52.0)
Hemoglobin: 15.1 g/dL (ref 13.0–17.0)
Lymphocytes Relative: 26.6 % (ref 12.0–46.0)
Lymphs Abs: 2.3 10*3/uL (ref 0.7–4.0)
MCHC: 33.9 g/dL (ref 30.0–36.0)
MCV: 92 fl (ref 78.0–100.0)
MONO ABS: 0.7 10*3/uL (ref 0.1–1.0)
Monocytes Relative: 8.1 % (ref 3.0–12.0)
Neutro Abs: 5.1 10*3/uL (ref 1.4–7.7)
Neutrophils Relative %: 57.9 % (ref 43.0–77.0)
Platelets: 231 10*3/uL (ref 150.0–400.0)
RBC: 4.86 Mil/uL (ref 4.22–5.81)
RDW: 12.8 % (ref 11.5–14.6)
WBC: 8.8 10*3/uL (ref 4.5–10.5)

## 2014-02-03 LAB — MICROALBUMIN / CREATININE URINE RATIO
Creatinine,U: 135.9 mg/dL
Microalb Creat Ratio: 0.8 mg/g (ref 0.0–30.0)
Microalb, Ur: 1.1 mg/dL (ref 0.0–1.9)

## 2014-02-03 LAB — HEPATIC FUNCTION PANEL
ALT: 31 U/L (ref 0–53)
AST: 27 U/L (ref 0–37)
Albumin: 4.6 g/dL (ref 3.5–5.2)
Alkaline Phosphatase: 46 U/L (ref 39–117)
BILIRUBIN DIRECT: 0.1 mg/dL (ref 0.0–0.3)
BILIRUBIN TOTAL: 0.6 mg/dL (ref 0.3–1.2)
TOTAL PROTEIN: 7.4 g/dL (ref 6.0–8.3)

## 2014-02-03 LAB — TSH: TSH: 10.64 u[IU]/mL — AB (ref 0.35–5.50)

## 2014-02-03 LAB — HEMOGLOBIN A1C: Hgb A1c MFr Bld: 7.1 % — ABNORMAL HIGH (ref 4.6–6.5)

## 2014-02-03 MED ORDER — HYDROCHLOROTHIAZIDE 25 MG PO TABS: 25.0000 mg | ORAL_TABLET | Freq: Every day | ORAL | Status: DC

## 2014-02-03 MED ORDER — CLOPIDOGREL BISULFATE 75 MG PO TABS: 75.0000 mg | ORAL_TABLET | Freq: Every day | ORAL | Status: DC

## 2014-02-03 MED ORDER — LABETALOL HCL 300 MG PO TABS: 300.0000 mg | ORAL_TABLET | Freq: Two times a day (BID) | ORAL | Status: DC

## 2014-02-03 MED ORDER — CETIRIZINE HCL 10 MG PO TABS
10.0000 mg | ORAL_TABLET | Freq: Every day | ORAL | Status: DC
Start: 2014-02-03 — End: 2017-02-19

## 2014-02-03 MED ORDER — AMLODIPINE BESYLATE 5 MG PO TABS: 5.0000 mg | ORAL_TABLET | Freq: Every day | ORAL | Status: DC

## 2014-02-03 MED ORDER — LEVOTHYROXINE SODIUM 50 MCG PO TABS: 50.0000 ug | ORAL_TABLET | Freq: Every day | ORAL | Status: DC

## 2014-02-03 MED ORDER — FENOFIBRATE 145 MG PO TABS: 145.0000 mg | ORAL_TABLET | Freq: Every day | ORAL | Status: DC

## 2014-02-03 MED ORDER — PRAVASTATIN SODIUM 40 MG PO TABS: 40.0000 mg | ORAL_TABLET | Freq: Every day | ORAL | Status: DC

## 2014-02-03 NOTE — Progress Notes (Signed)
Subjective:    Patient ID: [name], male    DOB: [birth year], 68 y.o.   MRN: [id number]  HPI  Here to f/u; overall doing ok,  Pt denies chest pain, increased sob or doe, wheezing, orthopnea, PND, increased LE swelling, palpitations, dizziness or syncope.  Pt denies polydipsia, polyuria, or low sugar symptoms such as weakness or confusion improved with po intake.  Pt denies new neurological symptoms such as new headache, or facial or extremity weakness or numbness.   Pt states overall good compliance with meds, has been trying to follow lower cholesterol, diabetic diet, with wt overall stable,  but little exercise however. No acute complaints. Denies hyper or hypo thyroid symptoms such as voice, skin or hair change. Past Medical History  Diagnosis Date  . ALLERGIC RHINITIS 11/06/2007  . BENIGN PROSTATIC HYPERTROPHY 07/14/2007  . COLONIC POLYPS, HX OF 07/14/2007  . GERD 07/11/2007  . Headache(784.0) 07/11/2007  . HYPERLIPIDEMIA 07/11/2007  . HYPERTENSION 07/11/2007  . HYPERTHYROIDISM 07/14/2007  . HYPOTHYROIDISM 11/06/2007  . OSTEOARTHRITIS, KNEE, RIGHT 10/27/2008  . PERIPHERAL EDEMA 05/31/2009  . PULMONARY NODULE 06/24/2008    granuloma  . WOUND, LEG 05/31/2009  . Degenerative arthritis of right knee 12/12/2011  . Anemia, unspecified 08/22/2012  . Complication of anesthesia     pt states he needs to be cathed after surgery  . Type II or unspecified type diabetes mellitus without mention of complication, uncontrolled 08/22/2012    Patient states that he is not diabetic, not taking any medications   Past Surgical History  Procedure Laterality Date  . Neck surgery    . Nissen fundoplication  7425  . Back surgery  1986    x 2  1986 C 3  . Hernia repair  sept 1986  . Left knee replacement  Nov 21, 2007  . Right heel bone spur  1987  . Total knee arthroplasty  01/09/2012    Procedure: TOTAL KNEE ARTHROPLASTY;  Surgeon: Tobi Bastos, MD;  Location: [facility];  Service: Orthopedics;  Laterality:  Right;  . Anterior cervical decomp/discectomy fusion N/A 01/06/2013    Procedure: ANTERIOR CERVICAL DECOMPRESSION/DISCECTOMY FUSION 1 LEVEL;  Surgeon: Erline Levine, MD;  Location: [facility];  Service: Neurosurgery;  Laterality: N/A;  Cervical three-four Anterior cervical decompression/diskectomy/fusion    reports that he quit smoking about 31 years ago. His smoking use included Cigarettes. He has a 90 pack-year smoking history. He has never used smokeless tobacco. He reports that he does not drink alcohol or use illicit drugs. family history includes Emphysema in his father; Heart attack in his father and mother; Heart disease in his other. Allergies  Allergen Reactions  . Adhesive [Tape] Other (See Comments)    Rash, reddness   Paper Tape: As far as pt knows, okay.  . Codeine Other (See Comments)    Headaches   . Naproxen Diarrhea  . Prednisone Diarrhea  . Zolpidem Tartrate Other (See Comments)    hallucinations  . Penicillins Rash    whelps   No current outpatient prescriptions on file prior to visit.   No current facility-administered medications on file prior to visit.   Review of Systems  Constitutional: Negative for unexpected weight change, or unusual diaphoresis  HENT: Negative for tinnitus.   Eyes: Negative for photophobia and visual disturbance.  Respiratory: Negative for choking and stridor.   Gastrointestinal: Negative for vomiting and blood in stool.  Genitourinary: Negative for hematuria and decreased urine volume.  Musculoskeletal: Negative for acute joint  swelling Skin: Negative for color change and wound.  Neurological: Negative for tremors and numbness other than noted  Psychiatric/Behavioral: Negative for decreased concentration or  hyperactivity.       Objective:   Physical Exam BP 118/78  Pulse 52  Temp(Src) 97.6 F (36.4 C) (Oral)  Ht 6' (1.829 m)  Wt 205 lb 8 oz (93.214 kg)  BMI 27.86 kg/m2  SpO2 96% VS noted,  Constitutional: Pt appears  well-developed and well-nourished.  HENT: Head: NCAT.  Right Ear: External ear normal.  Left Ear: External ear normal.  Eyes: Conjunctivae and EOM are normal. Pupils are equal, round, and reactive to light.  Neck: Normal range of motion. Neck supple.  Cardiovascular: Normal rate and regular rhythm.   Pulmonary/Chest: Effort normal and breath sounds normal.  Abd:  Soft, NT, non-distended, + BS Neurological: Pt is alert. Not confused  Skin: Skin is warm. No erythema.  Psychiatric: Pt behavior is normal. Thought content normal.     Assessment & Plan:

## 2014-02-03 NOTE — Assessment & Plan Note (Signed)
stable overall by history and exam, recent data reviewed with pt, and pt to continue medical treatment as before,  to f/u any worsening symptoms or concerns Lab Results  Component Value Date   LDLCALC 115* 02/03/2014

## 2014-02-03 NOTE — Assessment & Plan Note (Signed)
stable overall by history and exam, recent data reviewed with pt, and pt to continue medical treatment as before,  to f/u any worsening symptoms or concerns BP Readings from Last 3 Encounters:  02/03/14 118/78  10/01/13 148/82  08/06/13 120/70

## 2014-02-03 NOTE — Assessment & Plan Note (Signed)
stable overall by history and exam, recent data reviewed with pt, and pt to continue medical treatment as before,  to f/u any worsening symptoms or concerns Lab Results  Component Value Date   HGBA1C 7.1* 02/03/2014

## 2014-02-03 NOTE — Patient Instructions (Signed)
You had the new Prevnar pneumonia shot today Please continue all other medications as before, and refills have been done if requested. Please have the pharmacy call with any other refills you may need.  Please continue your efforts at being more active, low cholesterol diet, and weight control.  Please go to the LAB in the Basement (turn left off the elevator) for the tests to be done today You will be contacted by phone if any changes need to be made immediately.  Otherwise, you will receive a letter about your results with an explanation, but please check with MyChart first.  Please remember to sign up for MyChart if you have not done so, as this will be important to you in the future with finding out test results, communicating by private email, and scheduling acute appointments online when needed.  Please return in 6 months, or sooner if needed, with Lab testing done 3-5 days before

## 2014-02-03 NOTE — Assessment & Plan Note (Signed)
stable overall by history and exam, for f/u lab, and pt to continue medical treatment as before,  to f/u any worsening symptoms or concerns 

## 2014-02-03 NOTE — Progress Notes (Signed)
Pre visit review using our clinic review tool, if applicable. No additional management support is needed unless otherwise documented below in the visit note. 

## 2014-02-04 ENCOUNTER — Other Ambulatory Visit: Payer: Self-pay | Admitting: Internal Medicine

## 2014-02-04 DIAGNOSIS — R972 Elevated prostate specific antigen [PSA]: Secondary | ICD-10-CM

## 2014-02-04 MED ORDER — LEVOTHYROXINE SODIUM 75 MCG PO TABS: 75.0000 ug | ORAL_TABLET | Freq: Every day | ORAL | Status: DC

## 2014-04-01 ENCOUNTER — Ambulatory Visit: Payer: Medicare Other | Admitting: Nurse Practitioner

## 2014-07-29 ENCOUNTER — Other Ambulatory Visit (INDEPENDENT_AMBULATORY_CARE_PROVIDER_SITE_OTHER): Payer: Medicare Other

## 2014-07-29 DIAGNOSIS — Z0000 Encounter for general adult medical examination without abnormal findings: Secondary | ICD-10-CM

## 2014-07-29 DIAGNOSIS — E785 Hyperlipidemia, unspecified: Secondary | ICD-10-CM

## 2014-07-29 DIAGNOSIS — IMO0001 Reserved for inherently not codable concepts without codable children: Secondary | ICD-10-CM

## 2014-07-29 DIAGNOSIS — E1165 Type 2 diabetes mellitus with hyperglycemia: Secondary | ICD-10-CM

## 2014-07-29 DIAGNOSIS — R972 Elevated prostate specific antigen [PSA]: Secondary | ICD-10-CM

## 2014-07-29 LAB — BASIC METABOLIC PANEL
BUN: 14 mg/dL (ref 6–23)
CALCIUM: 10.1 mg/dL (ref 8.4–10.5)
CO2: 29 meq/L (ref 19–32)
CREATININE: 1.3 mg/dL (ref 0.4–1.5)
Chloride: 101 mEq/L (ref 96–112)
GFR: 57.94 mL/min — ABNORMAL LOW (ref >60.00)
Glucose, Bld: 168 mg/dL — ABNORMAL HIGH (ref 70–99)
Potassium: 3.7 mEq/L (ref 3.5–5.1)
Sodium: 139 mEq/L (ref 135–145)

## 2014-07-29 LAB — HEPATIC FUNCTION PANEL
ALT: 29 U/L (ref 0–53)
AST: 31 U/L (ref 0–37)
Albumin: 4.2 g/dL (ref 3.5–5.2)
Alkaline Phosphatase: 44 U/L (ref 39–117)
BILIRUBIN DIRECT: 0.1 mg/dL (ref 0.0–0.3)
Total Bilirubin: 0.8 mg/dL (ref 0.2–1.2)
Total Protein: 7.6 g/dL (ref 6.0–8.3)

## 2014-07-29 LAB — PSA: PSA: 0.95 ng/mL (ref 0.10–4.00)

## 2014-07-29 LAB — LIPID PANEL
Cholesterol: 200 mg/dL (ref 0–200)
HDL: 35.6 mg/dL — AB (ref >39.00)
NONHDL: 164.4
TRIGLYCERIDES: 413 mg/dL — AB (ref 0.0–149.0)
Total CHOL/HDL Ratio: 6
VLDL: 82.6 mg/dL — ABNORMAL HIGH (ref 0.0–40.0)

## 2014-07-29 LAB — HEMOGLOBIN A1C: Hgb A1c MFr Bld: 7.1 % — ABNORMAL HIGH (ref 4.6–6.5)

## 2014-07-29 LAB — LDL CHOLESTEROL, DIRECT: Direct LDL: 122.8 mg/dL

## 2014-08-06 ENCOUNTER — Ambulatory Visit (INDEPENDENT_AMBULATORY_CARE_PROVIDER_SITE_OTHER): Payer: Medicare Other | Admitting: Internal Medicine

## 2014-08-06 ENCOUNTER — Encounter: Payer: Self-pay | Admitting: Internal Medicine

## 2014-08-06 VITALS — BP 120/80 | HR 58 | Temp 98.3°F | Wt 202.8 lb

## 2014-08-06 DIAGNOSIS — IMO0001 Reserved for inherently not codable concepts without codable children: Secondary | ICD-10-CM

## 2014-08-06 DIAGNOSIS — Z23 Encounter for immunization: Secondary | ICD-10-CM

## 2014-08-06 DIAGNOSIS — Z0000 Encounter for general adult medical examination without abnormal findings: Secondary | ICD-10-CM

## 2014-08-06 DIAGNOSIS — E1165 Type 2 diabetes mellitus with hyperglycemia: Secondary | ICD-10-CM

## 2014-08-06 DIAGNOSIS — E785 Hyperlipidemia, unspecified: Secondary | ICD-10-CM

## 2014-08-06 MED ORDER — FENOFIBRATE 145 MG PO TABS
145.0000 mg | ORAL_TABLET | Freq: Every day | ORAL | Status: DC
Start: 2014-08-06 — End: 2014-11-16

## 2014-08-06 MED ORDER — CLOPIDOGREL BISULFATE 75 MG PO TABS: 75.0000 mg | ORAL_TABLET | Freq: Every day | ORAL | Status: DC

## 2014-08-06 MED ORDER — PRAVASTATIN SODIUM 80 MG PO TABS: 80.0000 mg | ORAL_TABLET | Freq: Every day | ORAL | Status: DC

## 2014-08-06 MED ORDER — HYDROCHLOROTHIAZIDE 25 MG PO TABS: 25.0000 mg | ORAL_TABLET | Freq: Every day | ORAL | Status: DC

## 2014-08-06 MED ORDER — LABETALOL HCL 300 MG PO TABS: 300.0000 mg | ORAL_TABLET | Freq: Two times a day (BID) | ORAL | Status: DC

## 2014-08-06 MED ORDER — AMLODIPINE BESYLATE 5 MG PO TABS
5.0000 mg | ORAL_TABLET | Freq: Every day | ORAL | Status: DC
Start: 2014-08-06 — End: 2014-11-16

## 2014-08-06 MED ORDER — PRAVASTATIN SODIUM 40 MG PO TABS: 40.0000 mg | ORAL_TABLET | Freq: Every day | ORAL | Status: DC

## 2014-08-06 MED ORDER — LEVOTHYROXINE SODIUM 75 MCG PO TABS: 75.0000 ug | ORAL_TABLET | Freq: Every day | ORAL | Status: DC

## 2014-08-06 NOTE — Patient Instructions (Addendum)
You had the flu shot today  Ok to increase the pravastatin to 80 mg per day  Please continue all other medications as before, and refills have been done if requested.  Please have the pharmacy call with any other refills you may need.  Please continue your efforts at being more active, low cholesterol diet, and weight control.  You are otherwise up to date with prevention measures today.  Please keep your appointments with your specialists as you may have planned  Please return in 6 months, or sooner if needed, with Lab testing done 3-5 days before

## 2014-08-06 NOTE — Progress Notes (Signed)
Subjective:    Patient ID: [name], male    DOB: 13-May-1946, 68 y.o.   MRN: [id number]  HPI Here for wellness and f/u;  Overall doing ok;  Pt denies CP, worsening SOB, DOE, wheezing, orthopnea, PND, worsening LE edema, palpitations, dizziness or syncope.  Pt denies neurological change such as new headache, facial or extremity weakness.  Pt denies polydipsia, polyuria, or low sugar symptoms. Pt states overall good compliance with treatment and medications, good tolerability, and has been trying to follow lower cholesterol diet.  Pt denies worsening depressive symptoms, suicidal ideation or panic. No fever, night sweats, wt loss, loss of appetite, or other constitutional symptoms.  Pt states good ability with ADL's, has low fall risk, home safety reviewed and adequate, no other significant changes in hearing or vision, and only occasionally active with exercise. No current complaints Past Medical History  Diagnosis Date  . ALLERGIC RHINITIS 11/06/2007  . BENIGN PROSTATIC HYPERTROPHY 07/14/2007  . COLONIC POLYPS, HX OF 07/14/2007  . GERD 07/11/2007  . Headache(784.0) 07/11/2007  . HYPERLIPIDEMIA 07/11/2007  . HYPERTENSION 07/11/2007  . HYPERTHYROIDISM 07/14/2007  . HYPOTHYROIDISM 11/06/2007  . OSTEOARTHRITIS, KNEE, RIGHT 10/27/2008  . PERIPHERAL EDEMA 05/31/2009  . PULMONARY NODULE 06/24/2008    granuloma  . WOUND, LEG 05/31/2009  . Degenerative arthritis of right knee 12/12/2011  . Anemia, unspecified 08/22/2012  . Complication of anesthesia     pt states he needs to be cathed after surgery  . Type II or unspecified type diabetes mellitus without mention of complication, uncontrolled 08/22/2012    Patient states that he is not diabetic, not taking any medications   Past Surgical History  Procedure Laterality Date  . Neck surgery    . Nissen fundoplication  1856  . Back surgery  1986    x 2  1986 C 3  . Hernia repair  sept 1986  . Left knee replacement  Nov 21, 2007  . Right heel bone spur   1987  . Total knee arthroplasty  01/09/2012    Procedure: TOTAL KNEE ARTHROPLASTY;  Surgeon: Tobi Bastos, MD;  Location: [facility];  Service: Orthopedics;  Laterality: Right;  . Anterior cervical decomp/discectomy fusion N/A 01/06/2013    Procedure: ANTERIOR CERVICAL DECOMPRESSION/DISCECTOMY FUSION 1 LEVEL;  Surgeon: Erline Levine, MD;  Location: [facility] [facility];  Service: Neurosurgery;  Laterality: N/A;  Cervical three-four Anterior cervical decompression/diskectomy/fusion    reports that he quit smoking about 31 years ago. His smoking use included Cigarettes. He has a 90 pack-year smoking history. He has never used smokeless tobacco. He reports that he does not drink alcohol or use illicit drugs. family history includes Emphysema in his father; Heart attack in his father and mother; Heart disease in his other. Allergies  Allergen Reactions  . Adhesive [Tape] Other (See Comments)    Rash, reddness   Paper Tape: As far as pt knows, okay.  . Codeine Other (See Comments)    Headaches   . Naproxen Diarrhea  . Prednisone Diarrhea  . Zolpidem Tartrate Other (See Comments)    hallucinations  . Penicillins Rash    whelps   Current Outpatient Prescriptions on File Prior to Visit  Medication Sig Dispense Refill  . cetirizine (ZYRTEC) 10 MG tablet Take 1 tablet (10 mg total) by mouth daily.  90 tablet  3   No current facility-administered medications on file prior to visit.   Review of Systems Constitutional: Negative for increased diaphoresis, other activity, appetite or other siginficant  weight change  HENT: Negative for worsening hearing loss, ear pain, facial swelling, mouth sores and neck stiffness.   Eyes: Negative for other worsening pain, redness or visual disturbance.  Respiratory: Negative for shortness of breath and wheezing.   Cardiovascular: Negative for chest pain and palpitations.  Gastrointestinal: Negative for diarrhea, blood in stool, abdominal distention or other  pain Genitourinary: Negative for hematuria, flank pain or change in urine volume.  Musculoskeletal: Negative for myalgias or other joint complaints.  Skin: Negative for color change and wound.  Neurological: Negative for syncope and numbness. other than noted Hematological: Negative for adenopathy. or other swelling Psychiatric/Behavioral: Negative for hallucinations, self-injury, decreased concentration or other worsening agitation.      Objective:   Physical Exam BP 120/80  Pulse 58  Temp(Src) 98.3 F (36.8 C) (Oral)  Wt 202 lb 12 oz (91.967 kg)  SpO2 96%\VS noted,  Constitutional: Pt is oriented to person, place, and time. Appears well-developed and well-nourished.  Head: Normocephalic and atraumatic.  Right Ear: External ear normal.  Left Ear: External ear normal.  Nose: Nose normal.  Mouth/Throat: Oropharynx is clear and moist.  Eyes: Conjunctivae and EOM are normal. Pupils are equal, round, and reactive to light.  Neck: Normal range of motion. Neck supple. No JVD present. No tracheal deviation present.  Cardiovascular: Normal rate, regular rhythm, normal heart sounds and intact distal pulses.   Pulmonary/Chest: Effort normal and breath sounds without rales or wheezing  Abdominal: Soft. Bowel sounds are normal. NT. No HSM  Musculoskeletal: Normal range of motion. Exhibits no edema.  Lymphadenopathy:  Has no cervical adenopathy.  Neurological: Pt is alert and oriented to person, place, and time. Pt has normal reflexes. No cranial nerve deficit. Motor grossly intact Skin: Skin is warm and dry. No rash noted.  Psychiatric:  Has normal mood and affect. Behavior is normal.     Assessment & Plan:

## 2014-08-06 NOTE — Progress Notes (Signed)
Pre visit review using our clinic review tool, if applicable. No additional management support is needed unless otherwise documented below in the visit note. 

## 2014-08-07 NOTE — Assessment & Plan Note (Signed)
Mild uncontrolled, for increase pravastatin 80 qd

## 2014-08-07 NOTE — Assessment & Plan Note (Signed)
stable overall by history and exam, recent data reviewed with pt, and pt to continue medical treatment as before,  to f/u any worsening symptoms or concerns\ Lab Results  Component Value Date   HGBA1C 7.1* 07/29/2014

## 2014-08-07 NOTE — Assessment & Plan Note (Signed)

## 2014-09-06 ENCOUNTER — Telehealth: Payer: Self-pay

## 2014-09-06 NOTE — Telephone Encounter (Signed)
Has not had an exam this year.  Unsure of whether or not he received on last year.  Plans to schedule an exam at the beginning of next year.

## 2014-11-02 ENCOUNTER — Other Ambulatory Visit: Payer: Self-pay | Admitting: Internal Medicine

## 2014-11-10 ENCOUNTER — Encounter: Payer: Self-pay | Admitting: Cardiology

## 2014-11-15 ENCOUNTER — Other Ambulatory Visit: Payer: Self-pay | Admitting: Internal Medicine

## 2014-11-16 ENCOUNTER — Other Ambulatory Visit: Payer: Self-pay

## 2014-11-16 MED ORDER — PRAVASTATIN SODIUM 80 MG PO TABS: 80.0000 mg | ORAL_TABLET | Freq: Every day | ORAL | Status: DC

## 2014-11-16 MED ORDER — LEVOTHYROXINE SODIUM 75 MCG PO TABS
75.0000 ug | ORAL_TABLET | Freq: Every day | ORAL | Status: DC
Start: 2014-11-16 — End: 2015-09-07

## 2014-11-16 MED ORDER — FENOFIBRATE 145 MG PO TABS: 145.0000 mg | ORAL_TABLET | Freq: Every day | ORAL | Status: DC

## 2014-11-16 MED ORDER — CLOPIDOGREL BISULFATE 75 MG PO TABS: 75.0000 mg | ORAL_TABLET | Freq: Every day | ORAL | Status: DC

## 2014-11-16 MED ORDER — LABETALOL HCL 300 MG PO TABS: 300.0000 mg | ORAL_TABLET | Freq: Two times a day (BID) | ORAL | Status: DC

## 2014-11-16 MED ORDER — AMLODIPINE BESYLATE 5 MG PO TABS: 5.0000 mg | ORAL_TABLET | Freq: Every day | ORAL | Status: DC

## 2015-01-05 ENCOUNTER — Other Ambulatory Visit: Payer: Self-pay | Admitting: Internal Medicine

## 2015-01-05 ENCOUNTER — Other Ambulatory Visit (INDEPENDENT_AMBULATORY_CARE_PROVIDER_SITE_OTHER): Payer: Medicare Other

## 2015-01-05 DIAGNOSIS — E785 Hyperlipidemia, unspecified: Secondary | ICD-10-CM | POA: Diagnosis not present

## 2015-01-05 DIAGNOSIS — IMO0002 Reserved for concepts with insufficient information to code with codable children: Secondary | ICD-10-CM

## 2015-01-05 DIAGNOSIS — E1165 Type 2 diabetes mellitus with hyperglycemia: Secondary | ICD-10-CM

## 2015-01-05 LAB — LIPID PANEL
CHOL/HDL RATIO: 5
Cholesterol: 195 mg/dL (ref 0–200)
HDL: 37.9 mg/dL — ABNORMAL LOW (ref >39.00)
NonHDL: 157.1
Triglycerides: 325 mg/dL — ABNORMAL HIGH (ref 0.0–149.0)
VLDL: 65 mg/dL — AB (ref 0.0–40.0)

## 2015-01-05 LAB — BASIC METABOLIC PANEL
BUN: 22 mg/dL (ref 6–23)
CALCIUM: 10.4 mg/dL (ref 8.4–10.5)
CO2: 30 mEq/L (ref 19–32)
Chloride: 101 mEq/L (ref 96–112)
Creatinine, Ser: 1.2 mg/dL (ref 0.40–1.50)
GFR: 63.46 mL/min (ref >60.00)
GLUCOSE: 225 mg/dL — AB (ref 70–99)
Potassium: 4.2 mEq/L (ref 3.5–5.1)
SODIUM: 137 meq/L (ref 135–145)

## 2015-01-05 LAB — HEPATIC FUNCTION PANEL
ALBUMIN: 4.4 g/dL (ref 3.5–5.2)
ALT: 21 U/L (ref 0–53)
AST: 19 U/L (ref 0–37)
Alkaline Phosphatase: 55 U/L (ref 39–117)
Bilirubin, Direct: 0.1 mg/dL (ref 0.0–0.3)
Total Bilirubin: 0.5 mg/dL (ref 0.2–1.2)
Total Protein: 7.4 g/dL (ref 6.0–8.3)

## 2015-01-05 LAB — LDL CHOLESTEROL, DIRECT: LDL DIRECT: 116 mg/dL

## 2015-01-05 LAB — HEMOGLOBIN A1C: Hgb A1c MFr Bld: 8.1 % — ABNORMAL HIGH (ref 4.6–6.5)

## 2015-01-05 MED ORDER — METFORMIN HCL ER 500 MG PO TB24: 500.0000 mg | ORAL_TABLET | Freq: Every day | ORAL | Status: DC

## 2015-02-08 ENCOUNTER — Telehealth: Payer: Self-pay | Admitting: Internal Medicine

## 2015-02-08 ENCOUNTER — Encounter: Payer: Self-pay | Admitting: Internal Medicine

## 2015-02-08 ENCOUNTER — Ambulatory Visit (INDEPENDENT_AMBULATORY_CARE_PROVIDER_SITE_OTHER): Payer: Medicare Other | Admitting: Internal Medicine

## 2015-02-08 VITALS — BP 122/78 | HR 60 | Temp 98.4°F | Resp 18 | Ht 72.0 in | Wt 205.1 lb

## 2015-02-08 DIAGNOSIS — Z87891 Personal history of nicotine dependence: Secondary | ICD-10-CM

## 2015-02-08 DIAGNOSIS — Z0189 Encounter for other specified special examinations: Secondary | ICD-10-CM

## 2015-02-08 DIAGNOSIS — E785 Hyperlipidemia, unspecified: Secondary | ICD-10-CM | POA: Diagnosis not present

## 2015-02-08 DIAGNOSIS — I10 Essential (primary) hypertension: Secondary | ICD-10-CM | POA: Diagnosis not present

## 2015-02-08 DIAGNOSIS — E119 Type 2 diabetes mellitus without complications: Secondary | ICD-10-CM

## 2015-02-08 DIAGNOSIS — Z0000 Encounter for general adult medical examination without abnormal findings: Secondary | ICD-10-CM

## 2015-02-08 HISTORY — DX: Personal history of nicotine dependence: Z87.891

## 2015-02-08 MED ORDER — HYDROCHLOROTHIAZIDE 25 MG PO TABS: 25.0000 mg | ORAL_TABLET | Freq: Every day | ORAL | Status: DC

## 2015-02-08 MED ORDER — ATORVASTATIN CALCIUM 40 MG PO TABS: 40.0000 mg | ORAL_TABLET | Freq: Every day | ORAL | Status: DC

## 2015-02-08 MED ORDER — GLUCOSE BLOOD VI STRP: ORAL_STRIP | Status: DC

## 2015-02-08 NOTE — Assessment & Plan Note (Signed)
stable overall by history and exam, recent data reviewed with pt, and pt to continue medical treatment as before,  to f/u any worsening symptoms or concerns BP Readings from Last 3 Encounters:  02/08/15 122/78  08/06/14 120/80  02/03/14 118/78

## 2015-02-08 NOTE — Assessment & Plan Note (Signed)
stable overall by history and exam, recent data reviewed with pt, and pt to continue medical treatment as before,  to f/u any worsening symptoms or concerns , tolerating metformin, for fu lab next visit

## 2015-02-08 NOTE — Progress Notes (Signed)
Subjective:    Patient ID: [name], male    DOB: 07-20-1946, 69 y.o.   MRN: [id number]  HPI  Here to f/u; overall doing ok,  Pt denies chest pain, increasing sob or doe, wheezing, orthopnea, PND, increased LE swelling, palpitations, dizziness or syncope.  Pt denies new neurological symptoms such as new headache, or facial or extremity weakness or numbness.  Pt denies polydipsia, polyuria, or low sugar episode.   Pt denies new neurological symptoms such as new headache, or facial or extremity weakness or numbness.   Pt states overall good compliance with meds, mostly trying to follow appropriate diet, with wt overall stable,  but little exercise however. Tolerating new metformiin after had lab done feb 2016 early for this visit.    Quit smoking 1984, prior 3 ppd for 25 yrs. For start low dose CT lung Ca screening;  Per CMS guidelines, I have determined the eligibility including patient age (12-80), and absence of any signs of lung cancer.  Specific calculation of number of pack years is documented.  Quit smoking years is documented.  Shared decision making engaged today, including a discussion of the benefits and harms of screening, discussion of need for followup with additional testing, risks of over-diagnosis, risk of false-positive screening examinations, and risk of radiation exposure.  Counseling done today on the importance of adherence to annual lunge cancer LDCT screening, importance of quit smoking and remaining quit, and tobacco cessation instructions given.  There is also discussion with patient regarding the impact of comorbidities, and patient states is able and willing to undergo diagnosis and treatment. Past Medical History  Diagnosis Date  . ALLERGIC RHINITIS 11/06/2007  . BENIGN PROSTATIC HYPERTROPHY 07/14/2007  . COLONIC POLYPS, HX OF 07/14/2007  . GERD 07/11/2007  . Headache(784.0) 07/11/2007  . HYPERLIPIDEMIA 07/11/2007  . HYPERTENSION 07/11/2007  . HYPERTHYROIDISM 07/14/2007    . HYPOTHYROIDISM 11/06/2007  . OSTEOARTHRITIS, KNEE, RIGHT 10/27/2008  . PERIPHERAL EDEMA 05/31/2009  . PULMONARY NODULE 06/24/2008    granuloma  . WOUND, LEG 05/31/2009  . Degenerative arthritis of right knee 12/12/2011  . Anemia, unspecified 08/22/2012  . Complication of anesthesia     pt states he needs to be cathed after surgery  . Type II or unspecified type diabetes mellitus without mention of complication, uncontrolled 08/22/2012    Patient states that he is not diabetic, not taking any medications   Past Surgical History  Procedure Laterality Date  . Neck surgery    . Nissen fundoplication  5859  . Back surgery  1986    x 2  1986 C 3  . Hernia repair  sept 1986  . Left knee replacement  Nov 21, 2007  . Right heel bone spur  1987  . Total knee arthroplasty  01/09/2012    Procedure: TOTAL KNEE ARTHROPLASTY;  Surgeon: Tobi Bastos, MD;  Location: [facility];  Service: Orthopedics;  Laterality: Right;  . Anterior cervical decomp/discectomy fusion N/A 01/06/2013    Procedure: ANTERIOR CERVICAL DECOMPRESSION/DISCECTOMY FUSION 1 LEVEL;  Surgeon: Erline Levine, MD;  Location: [place] [facility];  Service: Neurosurgery;  Laterality: N/A;  Cervical three-four Anterior cervical decompression/diskectomy/fusion    reports that he quit smoking about 32 years ago. His smoking use included Cigarettes. He has a 90 pack-year smoking history. He has never used smokeless tobacco. He reports that he does not drink alcohol or use illicit drugs. family history includes Emphysema in his father; Heart attack in his father and mother; Heart disease in  his other. Allergies  Allergen Reactions  . Adhesive [Tape] Other (See Comments)    Rash, reddness   Paper Tape: As far as pt knows, okay.  . Codeine Other (See Comments)    Headaches   . Naproxen Diarrhea  . Prednisone Diarrhea  . Zolpidem Tartrate Other (See Comments)    hallucinations  . Penicillins Rash    whelps   Current Outpatient Prescriptions on File  Prior to Visit  Medication Sig Dispense Refill  . amLODipine (NORVASC) 5 MG tablet Take 1 tablet (5 mg total) by mouth daily. 90 tablet 3  . cetirizine (ZYRTEC) 10 MG tablet Take 1 tablet (10 mg total) by mouth daily. 90 tablet 3  . clopidogrel (PLAVIX) 75 MG tablet Take 1 tablet (75 mg total) by mouth daily. 90 tablet 3  . fenofibrate (TRICOR) 145 MG tablet Take 1 tablet (145 mg total) by mouth daily. 90 tablet 3  . hydrochlorothiazide (HYDRODIURIL) 25 MG tablet TAKE ONE TABLET EVERY DAY 90 tablet 1  . labetalol (NORMODYNE) 300 MG tablet TAKE ONE TABLET TWICE DAILY 180 tablet 1  . labetalol (NORMODYNE) 300 MG tablet Take 1 tablet (300 mg total) by mouth 2 (two) times daily. 180 tablet 3  . levothyroxine (SYNTHROID, LEVOTHROID) 75 MCG tablet Take 1 tablet (75 mcg total) by mouth daily. 90 tablet 3  . metFORMIN (GLUCOPHAGE-XR) 500 MG 24 hr tablet Take 1 tablet (500 mg total) by mouth daily with breakfast. 90 tablet 3  . pravastatin (PRAVACHOL) 80 MG tablet Take 1 tablet (80 mg total) by mouth daily. 90 tablet 3   No current facility-administered medications on file prior to visit.   Review of Systems  Constitutional: Negative for unusual diaphoresis or night sweats HENT: Negative for ringing in ear or discharge Eyes: Negative for double vision or worsening visual disturbance.  Respiratory: Negative for choking and stridor.   Gastrointestinal: Negative for vomiting or other signifcant bowel change Genitourinary: Negative for hematuria or change in urine volume.  Musculoskeletal: Negative for other MSK pain or swelling Skin: Negative for color change and worsening wound.  Neurological: Negative for tremors and numbness other than noted  Psychiatric/Behavioral: Negative for decreased concentration or agitation other than above       Objective:   Physical Exam BP 122/78 mmHg  Pulse 60  Temp(Src) 98.4 F (36.9 C) (Oral)  Resp 18  Ht 6' (1.829 m)  Wt 205 lb 1.3 oz (93.024 kg)  BMI 27.81  kg/m2  SpO2 94% VS noted,  Constitutional: Pt appears in no significant distress HENT: Head: NCAT.  Right Ear: External ear normal.  Left Ear: External ear normal.  Eyes: . Pupils are equal, round, and reactive to light. Conjunctivae and EOM are normal Neck: Normal range of motion. Neck supple.  Cardiovascular: Normal rate and regular rhythm.   Pulmonary/Chest: Effort normal and breath sounds without rales or wheezing.  Abd:  Soft, NT, ND, + BS Neurological: Pt is alert. Not confused , motor grossly intact Skin: Skin is warm. No rash, no LE edema Psychiatric: Pt behavior is normal. No agitation.     Assessment & Plan:

## 2015-02-08 NOTE — Telephone Encounter (Signed)
emmi emailed °

## 2015-02-08 NOTE — Progress Notes (Signed)
Pre visit review using our clinic review tool, if applicable. No additional management support is needed unless otherwise documented below in the visit note. 

## 2015-02-08 NOTE — Patient Instructions (Signed)
OK to stop the pravachol  Please take all new medication as prescribed - the lipitor at 40 mg per day  Please continue all other medications as before, including the metformin  Please have the pharmacy call with any other refills you may need.  Please continue your efforts at being more active, low cholesterol diet, and weight control.  Please keep your appointments with your specialists as you may have planned  You will be contacted regarding the referral for: Low dose CT scan chest for lung cancer screening  Please return in 6 months, or sooner if needed, with Lab testing done 3-5 days before

## 2015-02-08 NOTE — Assessment & Plan Note (Signed)
Mild uncontrolled, for change pravachol to lipitor 80 qd,  to f/u any worsening symptoms or concerns

## 2015-02-08 NOTE — Assessment & Plan Note (Signed)
Qualifies for LDCT screening for lung cancer

## 2015-03-14 ENCOUNTER — Other Ambulatory Visit: Payer: Self-pay | Admitting: *Deleted

## 2015-03-14 MED ORDER — ACCU-CHEK AVIVA PLUS W/DEVICE KIT: PACK | Status: DC

## 2015-03-14 MED ORDER — GLUCOSE BLOOD VI STRP: 1.0000 | ORAL_STRIP | Freq: Two times a day (BID) | Status: DC

## 2015-03-14 MED ORDER — ACCU-CHEK MULTICLIX LANCETS MISC: 1.0000 | Freq: Two times a day (BID) | Status: DC

## 2015-05-23 ENCOUNTER — Other Ambulatory Visit: Payer: Self-pay

## 2015-05-28 DIAGNOSIS — R29898 Other symptoms and signs involving the musculoskeletal system: Secondary | ICD-10-CM | POA: Diagnosis not present

## 2015-05-28 DIAGNOSIS — K760 Fatty (change of) liver, not elsewhere classified: Secondary | ICD-10-CM | POA: Diagnosis not present

## 2015-05-28 DIAGNOSIS — J9811 Atelectasis: Secondary | ICD-10-CM | POA: Diagnosis not present

## 2015-05-28 DIAGNOSIS — L03116 Cellulitis of left lower limb: Secondary | ICD-10-CM | POA: Diagnosis not present

## 2015-05-28 DIAGNOSIS — I639 Cerebral infarction, unspecified: Secondary | ICD-10-CM | POA: Diagnosis not present

## 2015-05-28 DIAGNOSIS — R05 Cough: Secondary | ICD-10-CM | POA: Diagnosis not present

## 2015-05-28 DIAGNOSIS — S81802A Unspecified open wound, left lower leg, initial encounter: Secondary | ICD-10-CM | POA: Diagnosis not present

## 2015-05-28 DIAGNOSIS — R509 Fever, unspecified: Secondary | ICD-10-CM | POA: Diagnosis not present

## 2015-05-28 DIAGNOSIS — L03115 Cellulitis of right lower limb: Secondary | ICD-10-CM | POA: Diagnosis not present

## 2015-07-14 ENCOUNTER — Other Ambulatory Visit: Payer: Self-pay | Admitting: Internal Medicine

## 2015-08-05 ENCOUNTER — Other Ambulatory Visit (INDEPENDENT_AMBULATORY_CARE_PROVIDER_SITE_OTHER): Payer: Medicare Other

## 2015-08-05 DIAGNOSIS — I10 Essential (primary) hypertension: Secondary | ICD-10-CM | POA: Diagnosis not present

## 2015-08-05 DIAGNOSIS — E039 Hypothyroidism, unspecified: Secondary | ICD-10-CM

## 2015-08-05 DIAGNOSIS — E119 Type 2 diabetes mellitus without complications: Secondary | ICD-10-CM | POA: Diagnosis not present

## 2015-08-05 DIAGNOSIS — N400 Enlarged prostate without lower urinary tract symptoms: Secondary | ICD-10-CM | POA: Diagnosis not present

## 2015-08-05 DIAGNOSIS — E785 Hyperlipidemia, unspecified: Secondary | ICD-10-CM

## 2015-08-05 DIAGNOSIS — Z0000 Encounter for general adult medical examination without abnormal findings: Secondary | ICD-10-CM

## 2015-08-05 DIAGNOSIS — Z0189 Encounter for other specified special examinations: Secondary | ICD-10-CM

## 2015-08-05 LAB — URINALYSIS, ROUTINE W REFLEX MICROSCOPIC
BILIRUBIN URINE: NEGATIVE
HGB URINE DIPSTICK: NEGATIVE
KETONES UR: NEGATIVE
LEUKOCYTES UA: NEGATIVE
NITRITE: NEGATIVE
PH: 6 (ref 5.0–8.0)
Specific Gravity, Urine: 1.01 (ref 1.000–1.030)
TOTAL PROTEIN, URINE-UPE24: NEGATIVE
UROBILINOGEN UA: 0.2 (ref 0.0–1.0)
Urine Glucose: NEGATIVE

## 2015-08-05 LAB — CBC WITH DIFFERENTIAL/PLATELET
BASOS ABS: 0.1 10*3/uL (ref 0.0–0.1)
Basophils Relative: 0.5 % (ref 0.0–3.0)
EOS PCT: 6 % — AB (ref 0.0–5.0)
Eosinophils Absolute: 0.6 10*3/uL (ref 0.0–0.7)
HCT: 44.2 % (ref 39.0–52.0)
HEMOGLOBIN: 14.7 g/dL (ref 13.0–17.0)
LYMPHS ABS: 2.5 10*3/uL (ref 0.7–4.0)
LYMPHS PCT: 26.7 % (ref 12.0–46.0)
MCHC: 33.3 g/dL (ref 30.0–36.0)
MCV: 91.1 fl (ref 78.0–100.0)
MONOS PCT: 8.9 % (ref 3.0–12.0)
Monocytes Absolute: 0.8 10*3/uL (ref 0.1–1.0)
NEUTROS PCT: 57.9 % (ref 43.0–77.0)
Neutro Abs: 5.5 10*3/uL (ref 1.4–7.7)
Platelets: 251 10*3/uL (ref 150.0–400.0)
RBC: 4.86 Mil/uL (ref 4.22–5.81)
RDW: 14.6 % (ref 11.5–15.5)
WBC: 9.5 10*3/uL (ref 4.0–10.5)

## 2015-08-05 LAB — HEPATIC FUNCTION PANEL
ALBUMIN: 4.8 g/dL (ref 3.5–5.2)
ALT: 27 U/L (ref 0–53)
AST: 23 U/L (ref 0–37)
Alkaline Phosphatase: 54 U/L (ref 39–117)
Bilirubin, Direct: 0.1 mg/dL (ref 0.0–0.3)
TOTAL PROTEIN: 7.2 g/dL (ref 6.0–8.3)
Total Bilirubin: 0.5 mg/dL (ref 0.2–1.2)

## 2015-08-05 LAB — HEMOGLOBIN A1C: Hgb A1c MFr Bld: 7 % — ABNORMAL HIGH (ref 4.6–6.5)

## 2015-08-05 LAB — BASIC METABOLIC PANEL
BUN: 23 mg/dL (ref 6–23)
CHLORIDE: 103 meq/L (ref 96–112)
CO2: 30 meq/L (ref 19–32)
CREATININE: 1.14 mg/dL (ref 0.40–1.50)
Calcium: 11.2 mg/dL — ABNORMAL HIGH (ref 8.4–10.5)
GFR: 67.22 mL/min (ref >60.00)
GLUCOSE: 122 mg/dL — AB (ref 70–99)
POTASSIUM: 4.4 meq/L (ref 3.5–5.1)
Sodium: 143 mEq/L (ref 135–145)

## 2015-08-05 LAB — MICROALBUMIN / CREATININE URINE RATIO
CREATININE, U: 69.7 mg/dL
MICROALB/CREAT RATIO: 1 mg/g (ref 0.0–30.0)
Microalb, Ur: <0.7 mg/dL (ref 0.0–1.9)

## 2015-08-05 LAB — LIPID PANEL
CHOL/HDL RATIO: 3
CHOLESTEROL: 143 mg/dL (ref 0–200)
HDL: 41.1 mg/dL (ref >39.00)
LDL Cholesterol: 64 mg/dL (ref 0–99)
NonHDL: 102.35
TRIGLYCERIDES: 192 mg/dL — AB (ref 0.0–149.0)
VLDL: 38.4 mg/dL (ref 0.0–40.0)

## 2015-08-05 LAB — PSA: PSA: 1.06 ng/mL (ref 0.10–4.00)

## 2015-08-05 LAB — TSH: TSH: 9.46 u[IU]/mL — ABNORMAL HIGH (ref 0.35–4.50)

## 2015-08-12 ENCOUNTER — Encounter: Payer: Self-pay | Admitting: Internal Medicine

## 2015-08-12 ENCOUNTER — Other Ambulatory Visit: Payer: Self-pay | Admitting: Internal Medicine

## 2015-08-12 ENCOUNTER — Other Ambulatory Visit (INDEPENDENT_AMBULATORY_CARE_PROVIDER_SITE_OTHER): Payer: Medicare Other

## 2015-08-12 ENCOUNTER — Ambulatory Visit (INDEPENDENT_AMBULATORY_CARE_PROVIDER_SITE_OTHER): Payer: Medicare Other | Admitting: Internal Medicine

## 2015-08-12 VITALS — BP 130/80 | HR 50 | Temp 98.5°F | Ht 72.0 in | Wt 199.8 lb

## 2015-08-12 DIAGNOSIS — Z23 Encounter for immunization: Secondary | ICD-10-CM | POA: Diagnosis not present

## 2015-08-12 DIAGNOSIS — E119 Type 2 diabetes mellitus without complications: Secondary | ICD-10-CM

## 2015-08-12 DIAGNOSIS — E785 Hyperlipidemia, unspecified: Secondary | ICD-10-CM | POA: Diagnosis not present

## 2015-08-12 DIAGNOSIS — E039 Hypothyroidism, unspecified: Secondary | ICD-10-CM

## 2015-08-12 DIAGNOSIS — G459 Transient cerebral ischemic attack, unspecified: Secondary | ICD-10-CM | POA: Diagnosis not present

## 2015-08-12 DIAGNOSIS — E559 Vitamin D deficiency, unspecified: Secondary | ICD-10-CM

## 2015-08-12 DIAGNOSIS — I10 Essential (primary) hypertension: Secondary | ICD-10-CM

## 2015-08-12 HISTORY — DX: Vitamin D deficiency, unspecified: E55.9

## 2015-08-12 HISTORY — DX: Hypercalcemia: E83.52

## 2015-08-12 LAB — BASIC METABOLIC PANEL
BUN: 19 mg/dL (ref 6–23)
CALCIUM: 10.2 mg/dL (ref 8.4–10.5)
CO2: 30 mEq/L (ref 19–32)
CREATININE: 1.13 mg/dL (ref 0.40–1.50)
Chloride: 102 mEq/L (ref 96–112)
GFR: 67.9 mL/min (ref >60.00)
GLUCOSE: 103 mg/dL — AB (ref 70–99)
Potassium: 3.6 mEq/L (ref 3.5–5.1)
SODIUM: 141 meq/L (ref 135–145)

## 2015-08-12 LAB — HEPATIC FUNCTION PANEL
ALBUMIN: 4.6 g/dL (ref 3.5–5.2)
ALK PHOS: 47 U/L (ref 39–117)
ALT: 27 U/L (ref 0–53)
AST: 24 U/L (ref 0–37)
BILIRUBIN DIRECT: 0.1 mg/dL (ref 0.0–0.3)
Total Bilirubin: 0.5 mg/dL (ref 0.2–1.2)
Total Protein: 7.6 g/dL (ref 6.0–8.3)

## 2015-08-12 LAB — LIPID PANEL
Cholesterol: 139 mg/dL (ref 0–200)
HDL: 36.4 mg/dL — AB (ref >39.00)
NONHDL: 102.79
Total CHOL/HDL Ratio: 4
Triglycerides: 265 mg/dL — ABNORMAL HIGH (ref 0.0–149.0)
VLDL: 53 mg/dL — AB (ref 0.0–40.0)

## 2015-08-12 LAB — VITAMIN D 25 HYDROXY (VIT D DEFICIENCY, FRACTURES): VITD: 24.54 ng/mL — ABNORMAL LOW (ref 30.00–100.00)

## 2015-08-12 LAB — LDL CHOLESTEROL, DIRECT: Direct LDL: 77 mg/dL

## 2015-08-12 LAB — HEMOGLOBIN A1C: Hgb A1c MFr Bld: 6.9 % — ABNORMAL HIGH (ref 4.6–6.5)

## 2015-08-12 MED ORDER — VITAMIN D3 1.25 MG (50000 UT) PO TABS: 1.0000 | ORAL_TABLET | ORAL | Status: DC

## 2015-08-12 NOTE — Progress Notes (Signed)
Subjective:    Patient ID: [name], male    DOB: [birth year], 69 y.o.   MRN: [id number]  HPI  Here for yearly f/u;  Overall doing ok;  Pt denies Chest pain, worsening SOB, DOE, wheezing, orthopnea, PND, worsening LE edema, palpitations, dizziness or syncope.  Pt denies neurological change such as new headache, facial or extremity weakness.  Pt denies polydipsia, polyuria, or low sugar symptoms. Pt states overall good compliance with treatment and medications, good tolerability, and has been trying to follow appropriate diet.  Pt denies worsening depressive symptoms, suicidal ideation or panic. No fever, night sweats, wt loss, loss of appetite, or other constitutional symptoms.  Pt states good ability with ADL's, has low fall risk, home safety reviewed and adequate, no other significant changes in hearing or vision, and only occasionally active with exercise, but has joined a [place] [facility] exercise traaining classes.   No current complaints,  Had recent TIA now on plavix  Denies hyper or hypo thyroid symptoms such as voice, skin or hair change. Past Medical History  Diagnosis Date  . ALLERGIC RHINITIS 11/06/2007  . BENIGN PROSTATIC HYPERTROPHY 07/14/2007  . COLONIC POLYPS, HX OF 07/14/2007  . GERD 07/11/2007  . Headache(784.0) 07/11/2007  . HYPERLIPIDEMIA 07/11/2007  . HYPERTENSION 07/11/2007  . HYPERTHYROIDISM 07/14/2007  . HYPOTHYROIDISM 11/06/2007  . OSTEOARTHRITIS, KNEE, RIGHT 10/27/2008  . PERIPHERAL EDEMA 05/31/2009  . PULMONARY NODULE 06/24/2008    granuloma  . WOUND, LEG 05/31/2009  . Degenerative arthritis of right knee 12/12/2011  . Anemia, unspecified 08/22/2012  . Complication of anesthesia     pt states he needs to be cathed after surgery  . Type II or unspecified type diabetes mellitus without mention of complication, uncontrolled 08/22/2012    Patient states that he is not diabetic, not taking any medications  . Vitamin D deficiency 08/12/2015   Past Surgical History    Procedure Laterality Date  . Neck surgery    . Nissen fundoplication  3212  . Back surgery  1986    x 2  1986 C 3  . Hernia repair  sept 1986  . Left knee replacement  Nov 21, 2007  . Right heel bone spur  1987  . Total knee arthroplasty  01/09/2012    Procedure: TOTAL KNEE ARTHROPLASTY;  Surgeon: Tobi Bastos, MD;  Location: [facility];  Service: Orthopedics;  Laterality: Right;  . Anterior cervical decomp/discectomy fusion N/A 01/06/2013    Procedure: ANTERIOR CERVICAL DECOMPRESSION/DISCECTOMY FUSION 1 LEVEL;  Surgeon: Erline Levine, MD;  Location: [facility];  Service: Neurosurgery;  Laterality: N/A;  Cervical three-four Anterior cervical decompression/diskectomy/fusion    reports that he quit smoking about 32 years ago. His smoking use included Cigarettes. He has a 90 pack-year smoking history. He has never used smokeless tobacco. He reports that he does not drink alcohol or use illicit drugs. family history includes Emphysema in his father; Heart attack in his father and mother; Heart disease in his other. Allergies  Allergen Reactions  . Adhesive [Tape] Other (See Comments)    Rash, reddness   Paper Tape: As far as pt knows, okay.  . Codeine Other (See Comments)    Headaches   . Naproxen Diarrhea  . Prednisone Diarrhea  . Zolpidem Tartrate Other (See Comments)    hallucinations  . Penicillins Rash    whelps   Current Outpatient Prescriptions on File Prior to Visit  Medication Sig Dispense Refill  . amLODipine (NORVASC) 5 MG tablet Take  1 tablet (5 mg total) by mouth daily. 90 tablet 3  . atorvastatin (LIPITOR) 40 MG tablet Take 1 tablet (40 mg total) by mouth daily. 90 tablet 3  . Blood Glucose Monitoring Suppl (ACCU-CHEK AVIVA PLUS) W/DEVICE KIT Use as directed 1 kit 0  . cetirizine (ZYRTEC) 10 MG tablet Take 1 tablet (10 mg total) by mouth daily. 90 tablet 3  . clopidogrel (PLAVIX) 75 MG tablet Take 1 tablet (75 mg total) by mouth daily. 90 tablet 3  . fenofibrate  (TRICOR) 145 MG tablet Take 1 tablet (145 mg total) by mouth daily. 90 tablet 3  . glucose blood (ONE TOUCH ULTRA TEST) test strip Use as instructed 100 each 12  . glucose blood test strip 1 each by Other route 2 (two) times daily. Use to check blood sugars twice a day Dx E11.9 300 each 2  . hydrochlorothiazide (HYDRODIURIL) 25 MG tablet TAKE ONE TABLET EVERY DAY 90 tablet 1  . labetalol (NORMODYNE) 300 MG tablet TAKE ONE TABLET TWICE DAILY 180 tablet 1  . Lancets (ACCU-CHEK MULTICLIX) lancets 1 each by Other route 2 (two) times daily. Use to check blood sugars twice a day Dx E11.9 300 each 2  . levothyroxine (SYNTHROID, LEVOTHROID) 75 MCG tablet Take 1 tablet (75 mcg total) by mouth daily. 90 tablet 3  . metFORMIN (GLUCOPHAGE-XR) 500 MG 24 hr tablet Take 1 tablet (500 mg total) by mouth daily with breakfast. 90 tablet 3   No current facility-administered medications on file prior to visit.    Review of Systems Constitutional: Negative for increased diaphoresis, other activity, appetite or siginficant weight change other than noted HENT: Negative for worsening hearing loss, ear pain, facial swelling, mouth sores and neck stiffness.   Eyes: Negative for other worsening pain, redness or visual disturbance.  Respiratory: Negative for shortness of breath and wheezing  Cardiovascular: Negative for chest pain and palpitations.  Gastrointestinal: Negative for diarrhea, blood in stool, abdominal distention or other pain Genitourinary: Negative for hematuria, flank pain or change in urine volume.  Musculoskeletal: Negative for myalgias or other joint complaints.  Skin: Negative for color change and wound or drainage.  Neurological: Negative for syncope and numbness. other than noted Hematological: Negative for adenopathy. or other swelling Psychiatric/Behavioral: Negative for hallucinations, SI, self-injury, decreased concentration or other worsening agitation.      Objective:   Physical  Exam BP 130/80 mmHg  Pulse 50  Temp(Src) 98.5 F (36.9 C) (Oral)  Ht 6' (1.829 m)  Wt 199 lb 12 oz (90.606 kg)  BMI 27.09 kg/m2  SpO2 95% VS noted,  Constitutional: Pt is oriented to person, place, and time. Appears well-developed and well-nourished, in no significant distress Head: Normocephalic and atraumatic.  Right Ear: External ear normal.  Left Ear: External ear normal.  Nose: Nose normal.  Mouth/Throat: Oropharynx is clear and moist.  Eyes: Conjunctivae and EOM are normal. Pupils are equal, round, and reactive to light.  Neck: Normal range of motion. Neck supple. No JVD present. No tracheal deviation present or significant neck LA or mass Cardiovascular: Normal rate, regular rhythm, normal heart sounds and intact distal pulses.   Pulmonary/Chest: Effort normal and breath sounds without rales or wheezing  Abdominal: Soft. Bowel sounds are normal. NT. No HSM  Musculoskeletal: Normal range of motion. Exhibits no edema.  Lymphadenopathy:  Has no cervical adenopathy.  Neurological: Pt is alert and oriented to person, place, and time. Pt has normal reflexes. No cranial nerve deficit. Motor grossly intact Skin: Skin  is warm and dry. No rash noted.  Psychiatric:  Has normal mood and affect. Behavior is normal.      Assessment & Plan:

## 2015-08-12 NOTE — Patient Instructions (Signed)

## 2015-08-12 NOTE — Progress Notes (Signed)
Pre visit review using our clinic review tool, if applicable. No additional management support is needed unless otherwise documented below in the visit note. 

## 2015-08-13 ENCOUNTER — Encounter: Payer: Self-pay | Admitting: Internal Medicine

## 2015-08-13 DIAGNOSIS — G459 Transient cerebral ischemic attack, unspecified: Secondary | ICD-10-CM

## 2015-08-13 HISTORY — DX: Transient cerebral ischemic attack, unspecified: G45.9

## 2015-08-13 NOTE — Assessment & Plan Note (Signed)
stable overall by history and exam, recent data reviewed with pt, and pt to continue medical treatment as before,  to f/u any worsening symptoms or concerns Lab Results  Component Value Date   HGBA1C 6.9* 08/12/2015

## 2015-08-13 NOTE — Assessment & Plan Note (Signed)
stable overall by history and exam, recent data reviewed with pt, and pt to continue medical treatment as before,  to f/u any worsening symptoms or concerns BP Readings from Last 3 Encounters:  08/12/15 130/80  02/08/15 122/78  08/06/14 120/80

## 2015-08-13 NOTE — Assessment & Plan Note (Signed)
stable overall by history and exam, recent data reviewed with pt, and pt to continue medical treatment as before,  to f/u any worsening symptoms or concerns, cont plavix  Note:  Total time for pt hx, exam, review of record with pt in the room, determination of diagnoses and plan for further eval and tx is > 40 min, with over 50% spent in coordination and counseling of patient

## 2015-08-13 NOTE — Assessment & Plan Note (Signed)
stable overall by history and exam, recent data reviewed with pt, and pt to continue medical treatment as before,  to f/u any worsening symptoms or concerns Lab Results  Component Value Date   LDLCALC 64 08/05/2015

## 2015-08-13 NOTE — Assessment & Plan Note (Signed)
stable overall by history and exam, recent data reviewed with pt, and pt for increased po replacement today,  to f/u any worsening symptoms or concerns Lab Results  Component Value Date   TSH 9.46* 08/05/2015

## 2015-08-13 NOTE — Assessment & Plan Note (Signed)
?   Etiology, for PTH with repeat calcium, Vit D level,  to f/u any worsening symptoms or concerns

## 2015-08-15 ENCOUNTER — Other Ambulatory Visit: Payer: Self-pay | Admitting: Internal Medicine

## 2015-08-15 DIAGNOSIS — E209 Hypoparathyroidism, unspecified: Secondary | ICD-10-CM

## 2015-08-15 LAB — PTH, INTACT AND CALCIUM
Calcium: 9.9 mg/dL (ref 8.4–10.5)
PTH: 9 pg/mL — ABNORMAL LOW (ref 14–64)

## 2015-08-19 ENCOUNTER — Telehealth: Payer: Self-pay

## 2015-08-19 MED ORDER — ERGOCALCIFEROL 1.25 MG (50000 UT) PO CAPS: 50000.0000 [IU] | ORAL_CAPSULE | ORAL | Status: DC

## 2015-08-19 NOTE — Telephone Encounter (Signed)
Yes, ok 

## 2015-08-19 NOTE — Telephone Encounter (Signed)
Pharmacy called stating that they do not carry Vit D3 at 50,000 units but they do have D2. Okay to switch?

## 2015-09-07 ENCOUNTER — Ambulatory Visit (INDEPENDENT_AMBULATORY_CARE_PROVIDER_SITE_OTHER): Payer: Medicare Other | Admitting: Endocrinology

## 2015-09-07 ENCOUNTER — Encounter: Payer: Self-pay | Admitting: Endocrinology

## 2015-09-07 DIAGNOSIS — E042 Nontoxic multinodular goiter: Secondary | ICD-10-CM

## 2015-09-07 DIAGNOSIS — E039 Hypothyroidism, unspecified: Secondary | ICD-10-CM

## 2015-09-07 DIAGNOSIS — E049 Nontoxic goiter, unspecified: Secondary | ICD-10-CM

## 2015-09-07 MED ORDER — LEVOTHYROXINE SODIUM 100 MCG PO TABS: 100.0000 ug | ORAL_TABLET | Freq: Every day | ORAL | Status: DC

## 2015-09-07 NOTE — Patient Instructions (Addendum)
Cut HCTZ in 1/2   Vitamin D once a MONTH  New thyroid Rx is sent

## 2015-09-07 NOTE — Progress Notes (Signed)
Patient ID: [name], male   DOB: [birth year], 69 y.o.   MRN: [id number]         Referring physician: Harrietta Guardian  Chief complaint: High calcium  History of Present Illness:  He has been referred here for evaluation of high calcium.  Review of records show that he has had a high normal calcium since 2014 His calcium has been above normal only on one occasion last month   Lab Results  Component Value Date   CALCIUM 9.9 08/12/2015   CALCIUM 10.2 08/12/2015   CALCIUM 11.2* 08/05/2015   CALCIUM 10.4 01/05/2015   CALCIUM 10.1 07/29/2014   CALCIUM 10.0 02/03/2014   CALCIUM 10.3 08/03/2013   CALCIUM 10.0 12/31/2012   CALCIUM 9.9 08/22/2012    The hypercalcemia is not associated with any pathologic bone  fractures, renal insufficiency, nephrolithiasis, sarcoidosis, known carcinoma or  hyperthyroidism.  He does not ingest any calcium supplements are large amounts of dairy products  Prior serologic and radiologic studies have included:  Lab Results  Component Value Date   PTH 9* 08/12/2015   CALCIUM 9.9 08/12/2015   CALCIUM 10.2 08/12/2015      25 (OH) Vitamin D level in 9/16 was 24.5 .  He was started on 50,000 units weekly for vitamin D 2 but he has taken only 2 doses so far   HYPOTHYROIDISM:  He thinks he has been told to have hypothyroidism since 2008. He does not remember what symptoms he had at diagnosis and did not start feeling better with taking thyroid supplements He had been previously taking 50 g levothyroxine and since 01/2014 as been taking 75 g He takes this consistently 30 minutes before breakfast He does complain of feeling tired and sometimes sleepy.  Also has cold intolerance   Lab Results  Component Value Date   TSH 9.46* 08/05/2015   TSH 10.64* 02/03/2014   TSH 7.27* 08/03/2013       Medication List       This list is accurate as of: 09/07/15  8:34 PM.  Always use your most recent med list.               ACCU-CHEK AVIVA PLUS W/DEVICE  Kit  Use as directed     accu-chek multiclix lancets  1 each by Other route 2 (two) times daily. Use to check blood sugars twice a day Dx E11.9     amLODipine 5 MG tablet  Commonly known as:  NORVASC  Take 1 tablet (5 mg total) by mouth daily.     atorvastatin 40 MG tablet  Commonly known as:  LIPITOR  Take 1 tablet (40 mg total) by mouth daily.     cetirizine 10 MG tablet  Commonly known as:  ZYRTEC  Take 1 tablet (10 mg total) by mouth daily.     clopidogrel 75 MG tablet  Commonly known as:  PLAVIX  Take 1 tablet (75 mg total) by mouth daily.     ergocalciferol 50000 UNITS capsule  Commonly known as:  VITAMIN D2  Take 1 capsule (50,000 Units total) by mouth once a week.     fenofibrate 145 MG tablet  Commonly known as:  TRICOR  Take 1 tablet (145 mg total) by mouth daily.     glucose blood test strip  Commonly known as:  ONE TOUCH ULTRA TEST  Use as instructed     glucose blood test strip  1 each by Other route 2 (two) times daily. Use to check  blood sugars twice a day Dx E11.9     hydrochlorothiazide 25 MG tablet  Commonly known as:  HYDRODIURIL  TAKE ONE TABLET EVERY DAY     labetalol 300 MG tablet  Commonly known as:  NORMODYNE  TAKE ONE TABLET TWICE DAILY     levothyroxine 100 MCG tablet  Commonly known as:  SYNTHROID, LEVOTHROID  Take 1 tablet (100 mcg total) by mouth daily.     metFORMIN 500 MG 24 hr tablet  Commonly known as:  GLUCOPHAGE-XR  Take 1 tablet (500 mg total) by mouth daily with breakfast.        Allergies:  Allergies  Allergen Reactions  . Adhesive [Tape] Other (See Comments)    Rash, reddness   Paper Tape: As far as pt knows, okay.  . Codeine Other (See Comments)    Headaches   . Naproxen Diarrhea  . Prednisone Diarrhea  . Zolpidem Tartrate Other (See Comments)    hallucinations  . Penicillins Rash    whelps    Past Medical History  Diagnosis Date  . ALLERGIC RHINITIS 11/06/2007  . BENIGN PROSTATIC HYPERTROPHY 07/14/2007   . COLONIC POLYPS, HX OF 07/14/2007  . GERD 07/11/2007  . Headache(784.0) 07/11/2007  . HYPERLIPIDEMIA 07/11/2007  . HYPERTENSION 07/11/2007  . HYPERTHYROIDISM 07/14/2007  . HYPOTHYROIDISM 11/06/2007  . OSTEOARTHRITIS, KNEE, RIGHT 10/27/2008  . PERIPHERAL EDEMA 05/31/2009  . PULMONARY NODULE 06/24/2008    granuloma  . WOUND, LEG 05/31/2009  . Degenerative arthritis of right knee 12/12/2011  . Anemia, unspecified 08/22/2012  . Complication of anesthesia     pt states he needs to be cathed after surgery  . Type II or unspecified type diabetes mellitus without mention of complication, uncontrolled 08/22/2012    Patient states that he is not diabetic, not taking any medications  . Vitamin D deficiency 08/12/2015  . TIA (transient ischemic attack) 08/13/2015    Past Surgical History  Procedure Laterality Date  . Neck surgery    . Nissen fundoplication  9381  . Back surgery  1986    x 2  1986 C 3  . Hernia repair  sept 1986  . Left knee replacement  Nov 21, 2007  . Right heel bone spur  1987  . Total knee arthroplasty  01/09/2012    Procedure: TOTAL KNEE ARTHROPLASTY;  Surgeon: Tobi Bastos, MD;  Location: [facility];  Service: Orthopedics;  Laterality: Right;  . Anterior cervical decomp/discectomy fusion N/A 01/06/2013    Procedure: ANTERIOR CERVICAL DECOMPRESSION/DISCECTOMY FUSION 1 LEVEL;  Surgeon: Erline Levine, MD;  Location: [facility];  Service: Neurosurgery;  Laterality: N/A;  Cervical three-four Anterior cervical decompression/diskectomy/fusion    Family History  Problem Relation Age of Onset  . Heart disease Other   . Heart attack Mother   . Heart attack Father   . Emphysema Father   . Diabetes Neg Hx   . Thyroid disease Neg Hx     Social History:  reports that he quit smoking about 32 years ago. His smoking use included Cigarettes. He has a 90 pack-year smoking history. He has never used smokeless tobacco. He reports that he does not drink alcohol or use illicit drugs.  Review of  Systems  Constitutional: Positive for weight loss.       He has gradually been losing weight with changing his diet and exercising and he thinks he has lost about 20 pounds.  Does not complain of decreased appetite  Respiratory: Negative for cough.   Cardiovascular: Negative  for leg swelling.  Gastrointestinal: Negative for abdominal pain.  Genitourinary: Positive for frequency.       He will get up every 2 hours at night, frequently in the daytime also Last PSA normal No difficulty emptying his bladder  Musculoskeletal: Negative for back pain.  Skin: Negative for rash.  Neurological: Positive for focal weakness. Negative for headaches.       He has mild residual left-sided weakness since his stroke  Endo/Heme/Allergies: Negative for polydipsia.   HYPERTENSION: He has been on medication since about 2007. Blood pressure is relatively high, was normal with PCP on the last visit   not clear how long he has been on HCTZ  He is on medications for mild diabetes and hyperlipidemia with good control  Lab Results  Component Value Date   HGBA1C 6.9* 08/12/2015   HGBA1C 7.0* 08/05/2015   HGBA1C 8.1* 01/05/2015   Lab Results  Component Value Date   MICROALBUR <0.7 08/05/2015   LDLCALC 64 08/05/2015   CREATININE 1.13 08/12/2015      EXAM:  BP 168/88 mmHg  Pulse 49  Temp(Src) 98 F (36.7 C)  Resp 16  Ht 6' (1.829 m)  Wt 199 lb 6.4 oz (90.447 kg)  BMI 27.04 kg/m2  SpO2 97%  GENERAL: Averagely built and nourished.  Face is mildly puffy   No pallor, clubbing, lymphadenopathy in the neck  or edema.   Skin:  no rash or pigmentation.  EYES:  Externally normal.  no swelling of the eyelids and no proptosis   ENT: Oral mucosa, Pharynx and tongue normal.  Mild hoarseness of voice present  THYROID exam: Right side barely palpable, smooth and firm.  Left-sided shows a smooth rounded firm 2.5 cm nodule on swallowing  HEART:  Normal  S1 and S2; no murmur or click.  CHEST:  Normal  shape Lungs:   Vescicular breath sounds heard equally.  No crepitations/ wheeze.  ABDOMEN:  No distention.  Liver and spleen not palpable.  No other mass or tenderness.  NEUROLOGICAL: .Reflexes show mild delayed relaxation  bilaterally at biceps.  SPINE AND JOINTS:  Normal.   Assessment/Plan:   HYPERCALCEMIA:  He has had mostly upper normal calcium levels since about 2014 Since his PTH level is mildly suppressed the hypercalcemia is unlikely to be from hyperparathyroidism Also he doesn't appear to have any systemic disease that potentially can cause hypercalcemia Unlikely to have any malignant process also because of the long indolent history of calcium levels Most likely has high normal calcium levels related to his taking 25 mg HCTZ  Discussed the possible causes of high calcium  Recommendations:  Reduce HCTZ to half tablet  Chest x-ray to be done to rule out pulmonary disease  Recheck calcium in about a month  Also will recheck PTH from a different lab to confirm  If his calcium tends to be higher will order a PTH RP level also  avoid high dose vitamin D because of his tendency to hypercalcemia, recommended only 50,000 units monthly of the vitamin D2  HYPOTHYROIDISM:  He may be symptomatic from this with fatigue and persistently high TSH levels His TSH did not improve with increasing the dose from 50 up to 75 g last year and will go up to 100 g He will need a follow-up TSH on the next visit  THYROID enlargement:  he appears to have a goiter and may be a nodule on the left side on exam No imaging done previously of the thyroid  Will need to confirm this with an ultrasound and rule out left-sided nodule with ultrasound  Patient Instructions  Cut HCTZ in 1/2   Vitamin D once a MONTH  New thyroid Rx is sent      [facility] 09/07/2015, 8:34 PM

## 2015-09-21 ENCOUNTER — Ambulatory Visit
Admission: RE | Admit: 2015-09-21 | Discharge: 2015-09-21 | Disposition: A | Discharge status: Home / Self Care | Payer: Medicare Other | Source: Ambulatory Visit | Attending: Endocrinology | Admitting: Endocrinology

## 2015-09-21 DIAGNOSIS — E049 Nontoxic goiter, unspecified: Secondary | ICD-10-CM

## 2015-09-22 NOTE — Progress Notes (Signed)
Quick Note:  Please let patient know that the ultrasound is normal ______

## 2015-09-28 ENCOUNTER — Telehealth: Payer: Self-pay | Admitting: Internal Medicine

## 2015-09-28 MED ORDER — FENOFIBRATE 160 MG PO TABS: 160.0000 mg | ORAL_TABLET | Freq: Every day | ORAL | Status: DC

## 2015-09-28 NOTE — Telephone Encounter (Signed)
[place], done erx

## 2015-09-28 NOTE — Telephone Encounter (Signed)
[name] called to advise that the fenofibrate (TRICOR) 145 MG tablet [[id number]] is going up to tier 3. She states that the 160mg  is staying at tier 1. They want to know if it is possible to switch. He is also requesting that all current meds be refilled. All scripts going through [facility].

## 2015-10-07 ENCOUNTER — Other Ambulatory Visit: Payer: Self-pay

## 2015-10-07 ENCOUNTER — Other Ambulatory Visit (INDEPENDENT_AMBULATORY_CARE_PROVIDER_SITE_OTHER): Payer: Medicare Other

## 2015-10-07 DIAGNOSIS — E039 Hypothyroidism, unspecified: Secondary | ICD-10-CM | POA: Diagnosis not present

## 2015-10-07 LAB — RENAL FUNCTION PANEL
Albumin: 4.8 g/dL (ref 3.5–5.2)
BUN: 17 mg/dL (ref 6–23)
CHLORIDE: 104 meq/L (ref 96–112)
CO2: 31 mEq/L (ref 19–32)
Calcium: 10.8 mg/dL — ABNORMAL HIGH (ref 8.4–10.5)
Creatinine, Ser: 1.1 mg/dL (ref 0.40–1.50)
GFR: 70.01 mL/min (ref >60.00)
Glucose, Bld: 156 mg/dL — ABNORMAL HIGH (ref 70–99)
PHOSPHORUS: 3.5 mg/dL (ref 2.3–4.6)
POTASSIUM: 3.8 meq/L (ref 3.5–5.1)
SODIUM: 142 meq/L (ref 135–145)

## 2015-10-07 LAB — TSH: TSH: 4.18 u[IU]/mL (ref 0.35–4.50)

## 2015-10-07 LAB — T4, FREE: FREE T4: 0.76 ng/dL (ref 0.60–1.60)

## 2015-10-07 MED ORDER — ATORVASTATIN CALCIUM 40 MG PO TABS: 40.0000 mg | ORAL_TABLET | Freq: Every day | ORAL | Status: DC

## 2015-10-07 MED ORDER — HYDROCHLOROTHIAZIDE 25 MG PO TABS: 25.0000 mg | ORAL_TABLET | Freq: Every day | ORAL | Status: DC

## 2015-10-07 MED ORDER — ACCU-CHEK AVIVA PLUS W/DEVICE KIT: PACK | Status: DC

## 2015-10-07 MED ORDER — ACCU-CHEK MULTICLIX LANCETS MISC: 1.0000 | Freq: Two times a day (BID) | Status: DC

## 2015-10-12 ENCOUNTER — Encounter: Payer: Self-pay | Admitting: Endocrinology

## 2015-10-12 ENCOUNTER — Ambulatory Visit
Admission: RE | Admit: 2015-10-12 | Discharge: 2015-10-12 | Disposition: A | Discharge status: Home / Self Care | Payer: Medicare Other | Source: Ambulatory Visit | Attending: Endocrinology | Admitting: Endocrinology

## 2015-10-12 ENCOUNTER — Ambulatory Visit (INDEPENDENT_AMBULATORY_CARE_PROVIDER_SITE_OTHER): Payer: Medicare Other | Admitting: Endocrinology

## 2015-10-12 DIAGNOSIS — R05 Cough: Secondary | ICD-10-CM

## 2015-10-12 DIAGNOSIS — R931 Abnormal findings on diagnostic imaging of heart and coronary circulation: Secondary | ICD-10-CM | POA: Diagnosis not present

## 2015-10-12 DIAGNOSIS — R059 Cough, unspecified: Secondary | ICD-10-CM

## 2015-10-12 NOTE — Progress Notes (Signed)
Patient ID: [name], male   DOB: December 05, 1945, 69 y.o.   MRN: [id number]         Referring physician: Harrietta Guardian  Chief complaint: High calcium  History of Present Illness:   Review of records show that he has had a high normal calcium since 2014 Etiology of this is unclear as his PTH level has been low He was told to reduce his HCTZ to half tablet because she still about the same with some fluctuation. Highest recent calcium has been 11.2 and September  Although he has mild vitamin D deficiency it was told to take the 50,000 units supplement only once a month and not weekly  Lab Results  Component Value Date   CALCIUM 10.8* 10/07/2015   CALCIUM 9.9 08/12/2015   CALCIUM 10.2 08/12/2015   CALCIUM 11.2* 08/05/2015   CALCIUM 10.4 01/05/2015   CALCIUM 10.1 07/29/2014   CALCIUM 10.0 02/03/2014   CALCIUM 10.3 08/03/2013   CALCIUM 10.0 12/31/2012     Prior serologic and radiologic studies have included:  Lab Results  Component Value Date   PTH 9* 08/12/2015   CALCIUM 10.8* 10/07/2015   PHOS 3.5 10/07/2015      25 (OH) Vitamin D level in 9/16 was 24.5 .   HYPOTHYROIDISM:  He thinks he has been told to have hypothyroidism since 2008. He does not remember what symptoms he had at diagnosis and did not start feeling better with taking thyroid supplements  He had been previously taking 75 g levothyroxine  Since his TSH was significantly high as dose was increased to 100 g daily TSH is back to normal now He takes this consistently 30 minutes before breakfast   Lab Results  Component Value Date   TSH 4.18 10/07/2015   TSH 9.46* 08/05/2015   TSH 10.64* 02/03/2014   FREET4 0.76 10/07/2015    He was felt to have a left thyroid thyroid enlargement but on ultrasound this appeared to show no nodules     Medication List       This list is accurate as of: 10/12/15 11:59 PM.  Always use your most recent med list.               ACCU-CHEK AVIVA PLUS W/DEVICE Kit    Use as directed Dx code E11.9     accu-chek multiclix lancets  1 each by Other route 2 (two) times daily. Use to check blood sugars twice a day Dx E11.9     amLODipine 5 MG tablet  Commonly known as:  NORVASC  Take 1 tablet (5 mg total) by mouth daily.     atorvastatin 40 MG tablet  Commonly known as:  LIPITOR  Take 1 tablet (40 mg total) by mouth daily.     cetirizine 10 MG tablet  Commonly known as:  ZYRTEC  Take 1 tablet (10 mg total) by mouth daily.     clopidogrel 75 MG tablet  Commonly known as:  PLAVIX  Take 1 tablet (75 mg total) by mouth daily.     ergocalciferol 50000 UNITS capsule  Commonly known as:  VITAMIN D2  Take 1 capsule (50,000 Units total) by mouth once a week.     fenofibrate 160 MG tablet  Take 1 tablet (160 mg total) by mouth daily.     glucose blood test strip  Commonly known as:  ONE TOUCH ULTRA TEST  Use as instructed     glucose blood test strip  1 each by Other route  2 (two) times daily. Use to check blood sugars twice a day Dx E11.9     hydrochlorothiazide 25 MG tablet  Commonly known as:  HYDRODIURIL  Take 1 tablet (25 mg total) by mouth daily.     labetalol 300 MG tablet  Commonly known as:  NORMODYNE  TAKE ONE TABLET TWICE DAILY     levothyroxine 100 MCG tablet  Commonly known as:  SYNTHROID, LEVOTHROID  Take 1 tablet (100 mcg total) by mouth daily.     metFORMIN 500 MG 24 hr tablet  Commonly known as:  GLUCOPHAGE-XR  Take 1 tablet (500 mg total) by mouth daily with breakfast.        Allergies:  Allergies  Allergen Reactions  . Adhesive [Tape] Other (See Comments)    Rash, reddness   Paper Tape: As far as pt knows, okay.  . Codeine Other (See Comments)    Headaches   . Naproxen Diarrhea  . Prednisone Diarrhea  . Zolpidem Tartrate Other (See Comments)    hallucinations  . Penicillins Rash    whelps    Past Medical History  Diagnosis Date  . ALLERGIC RHINITIS 11/06/2007  . BENIGN PROSTATIC HYPERTROPHY 07/14/2007   . COLONIC POLYPS, HX OF 07/14/2007  . GERD 07/11/2007  . Headache(784.0) 07/11/2007  . HYPERLIPIDEMIA 07/11/2007  . HYPERTENSION 07/11/2007  . HYPERTHYROIDISM 07/14/2007  . HYPOTHYROIDISM 11/06/2007  . OSTEOARTHRITIS, KNEE, RIGHT 10/27/2008  . PERIPHERAL EDEMA 05/31/2009  . PULMONARY NODULE 06/24/2008    granuloma  . WOUND, LEG 05/31/2009  . Degenerative arthritis of right knee 12/12/2011  . Anemia, unspecified 08/22/2012  . Complication of anesthesia     pt states he needs to be cathed after surgery  . Type II or unspecified type diabetes mellitus without mention of complication, uncontrolled 08/22/2012    Patient states that he is not diabetic, not taking any medications  . Vitamin D deficiency 08/12/2015  . TIA (transient ischemic attack) 08/13/2015    Past Surgical History  Procedure Laterality Date  . Neck surgery    . Nissen fundoplication  6767  . Back surgery  1986    x 2  1986 C 3  . Hernia repair  sept 1986  . Left knee replacement  Nov 21, 2007  . Right heel bone spur  1987  . Total knee arthroplasty  01/09/2012    Procedure: TOTAL KNEE ARTHROPLASTY;  Surgeon: Tobi Bastos, MD;  Location: [facility];  Service: Orthopedics;  Laterality: Right;  . Anterior cervical decomp/discectomy fusion N/A 01/06/2013    Procedure: ANTERIOR CERVICAL DECOMPRESSION/DISCECTOMY FUSION 1 LEVEL;  Surgeon: Erline Levine, MD;  Location: [place] [facility];  Service: Neurosurgery;  Laterality: N/A;  Cervical three-four Anterior cervical decompression/diskectomy/fusion    Family History  Problem Relation Age of Onset  . Heart disease Other   . Heart attack Mother   . Heart attack Father   . Emphysema Father   . Diabetes Neg Hx   . Thyroid disease Neg Hx     Social History:  reports that he quit smoking about 32 years ago. His smoking use included Cigarettes. He has a 90 pack-year smoking history. He has never used smokeless tobacco. He reports that he does not drink alcohol or use illicit  drugs.  ROS HYPERTENSION: He has been on medication since about 2007. Blood pressure is relatively better even with reducing HCTZ to half tablet BP at home 130/70-80   He is on medications for mild diabetes and hyperlipidemia with good control  Lab Results  Component Value Date   HGBA1C 6.9* 08/12/2015   HGBA1C 7.0* 08/05/2015   HGBA1C 8.1* 01/05/2015   Lab Results  Component Value Date   MICROALBUR <0.7 08/05/2015   LDLCALC 64 08/05/2015   CREATININE 1.10 10/07/2015   Cough   EXAM:  BP 136/84 mmHg  Pulse 55  Temp(Src) 98.4 F (36.9 C)  Resp 16  Ht 6' (1.829 m)  Wt 201 lb 9.6 oz (91.445 kg)  BMI 27.34 kg/m2  SpO2 95%    THYROID exam: Indistinct left thyroid enlargement felt on swallowing  No pedal edema  Assessment/Plan:   HYPERCALCEMIA:  He has had mostly upper normal calcium levels since about 2014  Since his PTH level is mildly suppressed the hypercalcemia is unlikely to be from hyperparathyroidism Unlikely to have any malignant process also because of the long indolent history of calcium levels  Most likely has high normal calcium levels related to his taking  HCTZ but need to rule out underlying other causes such as sarcoidosis; he does complain of cough and shortness of breath  The level is still mildly high with taking HCTZ half tablet  Recommendations:  Chest x-ray to be done to rule out pulmonary disease  24-hour urine calcium and creatinine  Consider stopping HCTZ and replacing with another antihypertensive  HYPOTHYROIDISM:  He has normal levels with 100 g and feels subjectively better    [facility] 10/13/2015, 8:43 AM

## 2015-10-13 NOTE — Progress Notes (Signed)
Quick Note:  Please let patient know that the x-ray is normal, proceed with 24-hour urine for calcium and creatinine collection as discussed ______

## 2015-10-28 ENCOUNTER — Other Ambulatory Visit: Payer: Self-pay | Admitting: *Deleted

## 2015-10-29 LAB — CALCIUM, URINE, 24 HOUR
Calcium, 24 hour urine: 336 mg/24 h — ABNORMAL HIGH (ref 55–300)
Calcium, Ur: 14 mg/dL

## 2015-10-29 LAB — CREATININE, URINE, 24 HOUR
Creatinine, 24H Ur: 1.8 g/(24.h) (ref 0.63–2.50)
Creatinine, Urine: 75 mg/dL (ref 20–370)

## 2015-12-14 ENCOUNTER — Other Ambulatory Visit: Payer: Self-pay | Admitting: Internal Medicine

## 2015-12-26 ENCOUNTER — Other Ambulatory Visit: Payer: Self-pay | Admitting: Internal Medicine

## 2016-01-10 ENCOUNTER — Other Ambulatory Visit: Payer: Medicare Other

## 2016-01-13 ENCOUNTER — Encounter: Payer: Medicare Other | Admitting: Endocrinology

## 2016-02-06 ENCOUNTER — Other Ambulatory Visit (INDEPENDENT_AMBULATORY_CARE_PROVIDER_SITE_OTHER): Payer: Medicare Other

## 2016-02-06 ENCOUNTER — Telehealth: Payer: Self-pay

## 2016-02-06 DIAGNOSIS — Z0000 Encounter for general adult medical examination without abnormal findings: Secondary | ICD-10-CM

## 2016-02-06 DIAGNOSIS — R7989 Other specified abnormal findings of blood chemistry: Secondary | ICD-10-CM

## 2016-02-06 LAB — CBC WITH DIFFERENTIAL/PLATELET
BASOS ABS: 0 10*3/uL (ref 0.0–0.1)
Basophils Relative: 0.5 % (ref 0.0–3.0)
EOS ABS: 0.7 10*3/uL (ref 0.0–0.7)
EOS PCT: 7.6 % — AB (ref 0.0–5.0)
HCT: 44.6 % (ref 39.0–52.0)
HEMOGLOBIN: 15.1 g/dL (ref 13.0–17.0)
LYMPHS ABS: 2.8 10*3/uL (ref 0.7–4.0)
Lymphocytes Relative: 30 % (ref 12.0–46.0)
MCHC: 33.9 g/dL (ref 30.0–36.0)
MCV: 90.5 fl (ref 78.0–100.0)
MONO ABS: 0.8 10*3/uL (ref 0.1–1.0)
Monocytes Relative: 8.4 % (ref 3.0–12.0)
NEUTROS PCT: 53.5 % (ref 43.0–77.0)
Neutro Abs: 5 10*3/uL (ref 1.4–7.7)
Platelets: 271 10*3/uL (ref 150.0–400.0)
RBC: 4.93 Mil/uL (ref 4.22–5.81)
RDW: 12.6 % (ref 11.5–15.5)
WBC: 9.4 10*3/uL (ref 4.0–10.5)

## 2016-02-06 LAB — BASIC METABOLIC PANEL
BUN: 14 mg/dL (ref 6–23)
CALCIUM: 10.4 mg/dL (ref 8.4–10.5)
CHLORIDE: 104 meq/L (ref 96–112)
CO2: 28 meq/L (ref 19–32)
Creatinine, Ser: 1.11 mg/dL (ref 0.40–1.50)
GFR: 69.22 mL/min (ref >60.00)
GLUCOSE: 130 mg/dL — AB (ref 70–99)
POTASSIUM: 3.9 meq/L (ref 3.5–5.1)
SODIUM: 144 meq/L (ref 135–145)

## 2016-02-06 LAB — HEPATIC FUNCTION PANEL
ALBUMIN: 4.8 g/dL (ref 3.5–5.2)
ALK PHOS: 48 U/L (ref 39–117)
ALT: 24 U/L (ref 0–53)
AST: 20 U/L (ref 0–37)
BILIRUBIN DIRECT: 0.1 mg/dL (ref 0.0–0.3)
TOTAL PROTEIN: 7.4 g/dL (ref 6.0–8.3)
Total Bilirubin: 0.6 mg/dL (ref 0.2–1.2)

## 2016-02-06 LAB — LIPID PANEL
Cholesterol: 165 mg/dL (ref 0–200)
HDL: 38.7 mg/dL — AB (ref >39.00)
NONHDL: 125.92
Total CHOL/HDL Ratio: 4
Triglycerides: 217 mg/dL — ABNORMAL HIGH (ref 0.0–149.0)
VLDL: 43.4 mg/dL — ABNORMAL HIGH (ref 0.0–40.0)

## 2016-02-06 LAB — TSH: TSH: 11.36 u[IU]/mL — AB (ref 0.35–4.50)

## 2016-02-06 LAB — LDL CHOLESTEROL, DIRECT: LDL DIRECT: 92 mg/dL

## 2016-02-06 LAB — PSA: PSA: 1.01 ng/mL (ref 0.10–4.00)

## 2016-02-06 NOTE — Telephone Encounter (Signed)
Orders put in prior to office visit

## 2016-02-10 ENCOUNTER — Ambulatory Visit (INDEPENDENT_AMBULATORY_CARE_PROVIDER_SITE_OTHER): Payer: Medicare Other | Admitting: Internal Medicine

## 2016-02-10 ENCOUNTER — Encounter: Payer: Self-pay | Admitting: Internal Medicine

## 2016-02-10 VITALS — BP 136/80 | HR 57 | Temp 98.7°F | Resp 20 | Wt 204.0 lb

## 2016-02-10 DIAGNOSIS — Z0000 Encounter for general adult medical examination without abnormal findings: Secondary | ICD-10-CM | POA: Diagnosis not present

## 2016-02-10 DIAGNOSIS — E785 Hyperlipidemia, unspecified: Secondary | ICD-10-CM

## 2016-02-10 DIAGNOSIS — E039 Hypothyroidism, unspecified: Secondary | ICD-10-CM | POA: Diagnosis not present

## 2016-02-10 DIAGNOSIS — I10 Essential (primary) hypertension: Secondary | ICD-10-CM | POA: Diagnosis not present

## 2016-02-10 DIAGNOSIS — E119 Type 2 diabetes mellitus without complications: Secondary | ICD-10-CM

## 2016-02-10 MED ORDER — AMLODIPINE BESYLATE 5 MG PO TABS: 5.0000 mg | ORAL_TABLET | Freq: Every day | ORAL | Status: DC

## 2016-02-10 MED ORDER — FENOFIBRATE 160 MG PO TABS: 160.0000 mg | ORAL_TABLET | Freq: Every day | ORAL | Status: DC

## 2016-02-10 MED ORDER — LABETALOL HCL 300 MG PO TABS
ORAL_TABLET | ORAL | Status: DC
Start: 2016-02-10 — End: 2016-12-10

## 2016-02-10 MED ORDER — CLOPIDOGREL BISULFATE 75 MG PO TABS: ORAL_TABLET | ORAL | Status: DC

## 2016-02-10 MED ORDER — HYDROCHLOROTHIAZIDE 25 MG PO TABS: 25.0000 mg | ORAL_TABLET | Freq: Every day | ORAL | Status: DC

## 2016-02-10 MED ORDER — ROSUVASTATIN CALCIUM 40 MG PO TABS: 40.0000 mg | ORAL_TABLET | Freq: Every day | ORAL | Status: DC

## 2016-02-10 MED ORDER — LEVOTHYROXINE SODIUM 125 MCG PO TABS: 125.0000 ug | ORAL_TABLET | Freq: Every day | ORAL | Status: DC

## 2016-02-10 MED ORDER — METFORMIN HCL ER 500 MG PO TB24: ORAL_TABLET | ORAL | Status: DC

## 2016-02-10 NOTE — Patient Instructions (Addendum)
OK to finish the lipitor you have, then stop  Please take all new medication as prescribed then - the crestor 40 mg   OK to increase the thyroid medication to 125 mcg per day  Please continue all other medications as before, and refills have been done if requested.  Please have the pharmacy call with any other refills you may need.  Please continue your efforts at being more active, low cholesterol diet, and weight control.  You are otherwise up to date with prevention measures today.  Please keep your appointments with your specialists as you may have planned  Please return in 6 months, or sooner if needed, with Lab testing done 3-5 days before

## 2016-02-10 NOTE — Progress Notes (Signed)
Subjective:    Patient ID: [name], male    DOB: 1946-08-14, 70 y.o.   MRN: [id number]  HPI    Here for wellness and f/u;  Overall doing ok;  Pt denies Chest pain, worsening SOB, DOE, wheezing, orthopnea, PND, worsening LE edema, palpitations, dizziness or syncope.  Pt denies neurological change such as new headache, facial or extremity weakness, has no change in persistent left sided weakness.  Pt denies polydipsia, polyuria, or low sugar symptoms. Pt states overall good compliance with treatment and medications, good tolerability, and has been trying to follow appropriate diet.  Pt denies worsening depressive symptoms, suicidal ideation or panic. No fever, night sweats, wt loss, loss of appetite, or other constitutional symptoms.  Pt states good ability with ADL's, has low fall risk, home safety reviewed and adequate, no other significant changes in hearing or vision, and only occasionally active with exercise. Denies hyper or hypo thyroid symptoms such as voice, skin or hair change.  Pt denies fever, wt loss, night sweats, loss of appetite, or other constitutional symptoms Past Medical History  Diagnosis Date  . ALLERGIC RHINITIS 11/06/2007  . BENIGN PROSTATIC HYPERTROPHY 07/14/2007  . COLONIC POLYPS, HX OF 07/14/2007  . GERD 07/11/2007  . Headache(784.0) 07/11/2007  . HYPERLIPIDEMIA 07/11/2007  . HYPERTENSION 07/11/2007  . HYPERTHYROIDISM 07/14/2007  . HYPOTHYROIDISM 11/06/2007  . OSTEOARTHRITIS, KNEE, RIGHT 10/27/2008  . PERIPHERAL EDEMA 05/31/2009  . PULMONARY NODULE 06/24/2008    granuloma  . WOUND, LEG 05/31/2009  . Degenerative arthritis of right knee 12/12/2011  . Anemia, unspecified 08/22/2012  . Complication of anesthesia     pt states he needs to be cathed after surgery  . Type II or unspecified type diabetes mellitus without mention of complication, uncontrolled 08/22/2012    Patient states that he is not diabetic, not taking any medications  . Vitamin D deficiency 08/12/2015  . TIA  (transient ischemic attack) 08/13/2015   Past Surgical History  Procedure Laterality Date  . Neck surgery    . Nissen fundoplication  6415  . Back surgery  1986    x 2  1986 C 3  . Hernia repair  sept 1986  . Left knee replacement  Nov 21, 2007  . Right heel bone spur  1987  . Total knee arthroplasty  01/09/2012    Procedure: TOTAL KNEE ARTHROPLASTY;  Surgeon: Tobi Bastos, MD;  Location: [facility];  Service: Orthopedics;  Laterality: Right;  . Anterior cervical decomp/discectomy fusion N/A 01/06/2013    Procedure: ANTERIOR CERVICAL DECOMPRESSION/DISCECTOMY FUSION 1 LEVEL;  Surgeon: Erline Levine, MD;  Location: [facility];  Service: Neurosurgery;  Laterality: N/A;  Cervical three-four Anterior cervical decompression/diskectomy/fusion    reports that he quit smoking about 33 years ago. His smoking use included Cigarettes. He has a 90 pack-year smoking history. He has never used smokeless tobacco. He reports that he does not drink alcohol or use illicit drugs. family history includes Emphysema in his father; Heart attack in his father and mother; Heart disease in his other. There is no history of Diabetes or Thyroid disease. Allergies  Allergen Reactions  . Adhesive [Tape] Other (See Comments)    Rash, reddness   Paper Tape: As far as pt knows, okay.  . Codeine Other (See Comments)    Headaches   . Naproxen Diarrhea  . Prednisone Diarrhea  . Zolpidem Tartrate Other (See Comments)    hallucinations  . Penicillins Rash    whelps   Current Outpatient Prescriptions  on File Prior to Visit  Medication Sig Dispense Refill  . Blood Glucose Monitoring Suppl (ACCU-CHEK AVIVA PLUS) W/DEVICE KIT Use as directed Dx code E11.9 1 kit 0  . cetirizine (ZYRTEC) 10 MG tablet Take 1 tablet (10 mg total) by mouth daily. 90 tablet 3  . ergocalciferol (VITAMIN D2) 50000 UNITS capsule Take 1 capsule (50,000 Units total) by mouth once a week. 12 capsule 1  . glucose blood (ONE TOUCH ULTRA TEST) test strip  Use as instructed 100 each 12  . glucose blood test strip 1 each by Other route 2 (two) times daily. Use to check blood sugars twice a day Dx E11.9 300 each 2  . Lancets (ACCU-CHEK MULTICLIX) lancets 1 each by Other route 2 (two) times daily. Use to check blood sugars twice a day Dx E11.9 300 each 2   No current facility-administered medications on file prior to visit.   Review of Systems Constitutional: Negative for increased diaphoresis, other activity, appetite or siginficant weight change other than noted HENT: Negative for worsening hearing loss, ear pain, facial swelling, mouth sores and neck stiffness.   Eyes: Negative for other worsening pain, redness or visual disturbance.  Respiratory: Negative for shortness of breath and wheezing  Cardiovascular: Negative for chest pain and palpitations.  Gastrointestinal: Negative for diarrhea, blood in stool, abdominal distention or other pain Genitourinary: Negative for hematuria, flank pain or change in urine volume.  Musculoskeletal: Negative for myalgias or other joint complaints.  Skin: Negative for color change and wound or drainage.  Neurological: Negative for syncope and numbness. other than noted Hematological: Negative for adenopathy. or other swelling Psychiatric/Behavioral: Negative for hallucinations, SI, self-injury, decreased concentration or other worsening agitation.      Objective:   Physical Exam BP 136/80 mmHg  Pulse 57  Temp(Src) 98.7 F (37.1 C) (Oral)  Resp 20  Wt 204 lb (92.534 kg)  SpO2 97% VS noted,  Constitutional: Pt is oriented to person, place, and time. Appears well-developed and well-nourished, in no significant distress Head: Normocephalic and atraumatic.  Right Ear: External ear normal.  Left Ear: External ear normal.  Nose: Nose normal.  Mouth/Throat: Oropharynx is clear and moist.  Eyes: Conjunctivae and EOM are normal. Pupils are equal, round, and reactive to light.  Neck: Normal range of  motion. Neck supple. No JVD present. No tracheal deviation present or significant neck LA or mass Cardiovascular: Normal rate, regular rhythm, normal heart sounds and intact distal pulses.   Pulmonary/Chest: Effort normal and breath sounds without rales or wheezing  Abdominal: Soft. Bowel sounds are normal. NT. No HSM  Musculoskeletal: Normal range of motion. Exhibits no edema.  Lymphadenopathy:  Has no cervical adenopathy.  Neurological: Pt is alert and oriented to person, place, and time. Pt has normal reflexes. No cranial nerve deficit. Motor with 4+/5 LUE/LLE weakness Skin: Skin is warm and dry. No rash noted.  Psychiatric:  Has normal mood and affect. Behavior is normal.     Assessment & Plan:

## 2016-02-10 NOTE — Progress Notes (Signed)
Pre visit review using our clinic review tool, if applicable. No additional management support is needed unless otherwise documented below in the visit note. 

## 2016-02-11 NOTE — Assessment & Plan Note (Signed)
stable overall by history and exam, recent data reviewed with pt, and pt to continue medical treatment as before,  to f/u any worsening symptoms or concerns Lab Results  Component Value Date   HGBA1C 6.9* 08/12/2015    

## 2016-02-11 NOTE — Assessment & Plan Note (Signed)

## 2016-02-11 NOTE — Assessment & Plan Note (Signed)
stable overall by history and exam, recent data reviewed with pt, and pt to continue medical treatment as before,  to f/u any worsening symptoms or concerns BP Readings from Last 3 Encounters:  02/10/16 136/80  10/12/15 136/84  09/07/15 168/88

## 2016-02-11 NOTE — Assessment & Plan Note (Signed)
Mild uncontrolled, for change levothryoxin to 125 qd, f/u next visit Lab Results  Component Value Date   TSH 11.36* 02/06/2016

## 2016-02-11 NOTE — Assessment & Plan Note (Signed)
Mild uncontrolled, for change statin to crestor,  Lab Results  Component Value Date   [facility] 64 08/05/2015

## 2016-03-01 NOTE — Progress Notes (Signed)
This encounter was created in error - please disregard.

## 2016-04-04 ENCOUNTER — Other Ambulatory Visit: Payer: Self-pay | Admitting: Internal Medicine

## 2016-04-04 ENCOUNTER — Encounter: Payer: Self-pay | Admitting: Internal Medicine

## 2016-05-19 ENCOUNTER — Other Ambulatory Visit: Payer: Self-pay | Admitting: Internal Medicine

## 2016-06-26 LAB — HM DIABETES EYE EXAM

## 2016-07-07 ENCOUNTER — Other Ambulatory Visit: Payer: Self-pay | Admitting: Internal Medicine

## 2016-08-10 ENCOUNTER — Telehealth: Payer: Self-pay

## 2016-08-10 NOTE — Telephone Encounter (Signed)
Outreach to Mr and Mrs. Davilla schedule for AWV 9/19 but changed apt with Dr. Jenny Reichmann to Friday 9/22.  Call to Ms. Olmsted; She reschedule for Tuesday 1024 at 10:15 for her and 11am for Mr. Garno They will be together!  Awaiting [facility] schedule for [facility] to open so apts can be scheduled.

## 2016-08-14 ENCOUNTER — Other Ambulatory Visit (INDEPENDENT_AMBULATORY_CARE_PROVIDER_SITE_OTHER): Payer: Medicare Other

## 2016-08-14 DIAGNOSIS — E039 Hypothyroidism, unspecified: Secondary | ICD-10-CM

## 2016-08-14 DIAGNOSIS — E119 Type 2 diabetes mellitus without complications: Secondary | ICD-10-CM | POA: Diagnosis not present

## 2016-08-14 DIAGNOSIS — Z0000 Encounter for general adult medical examination without abnormal findings: Secondary | ICD-10-CM | POA: Diagnosis not present

## 2016-08-14 LAB — HEPATIC FUNCTION PANEL
ALBUMIN: 4.3 g/dL (ref 3.5–5.2)
ALT: 19 U/L (ref 0–53)
AST: 15 U/L (ref 0–37)
Alkaline Phosphatase: 46 U/L (ref 39–117)
Bilirubin, Direct: 0.1 mg/dL (ref 0.0–0.3)
TOTAL PROTEIN: 7.2 g/dL (ref 6.0–8.3)
Total Bilirubin: 0.6 mg/dL (ref 0.2–1.2)

## 2016-08-14 LAB — LIPID PANEL
CHOL/HDL RATIO: 4
CHOLESTEROL: 133 mg/dL (ref 0–200)
HDL: 34 mg/dL — ABNORMAL LOW (ref >39.00)
LDL Cholesterol: 63 mg/dL (ref 0–99)
NonHDL: 98.5
TRIGLYCERIDES: 177 mg/dL — AB (ref 0.0–149.0)
VLDL: 35.4 mg/dL (ref 0.0–40.0)

## 2016-08-14 LAB — BASIC METABOLIC PANEL
BUN: 18 mg/dL (ref 6–23)
CHLORIDE: 105 meq/L (ref 96–112)
CO2: 29 mEq/L (ref 19–32)
Calcium: 9.8 mg/dL (ref 8.4–10.5)
Creatinine, Ser: 1.16 mg/dL (ref 0.40–1.50)
GFR: 65.69 mL/min (ref >60.00)
GLUCOSE: 144 mg/dL — AB (ref 70–99)
POTASSIUM: 4.1 meq/L (ref 3.5–5.1)
Sodium: 141 mEq/L (ref 135–145)

## 2016-08-14 LAB — HEMOGLOBIN A1C: HEMOGLOBIN A1C: 7.1 % — AB (ref 4.6–6.5)

## 2016-08-14 LAB — TSH: TSH: 1.22 u[IU]/mL (ref 0.35–4.50)

## 2016-08-15 LAB — HEPATITIS C ANTIBODY: HCV AB: NEGATIVE

## 2016-08-17 ENCOUNTER — Ambulatory Visit (INDEPENDENT_AMBULATORY_CARE_PROVIDER_SITE_OTHER): Payer: Medicare Other | Admitting: Internal Medicine

## 2016-08-17 ENCOUNTER — Encounter: Payer: Self-pay | Admitting: Internal Medicine

## 2016-08-17 VITALS — BP 152/88 | HR 77 | Temp 98.1°F | Ht 72.0 in | Wt 208.0 lb

## 2016-08-17 DIAGNOSIS — M25511 Pain in right shoulder: Secondary | ICD-10-CM | POA: Insufficient documentation

## 2016-08-17 DIAGNOSIS — E559 Vitamin D deficiency, unspecified: Secondary | ICD-10-CM

## 2016-08-17 DIAGNOSIS — R296 Repeated falls: Secondary | ICD-10-CM | POA: Insufficient documentation

## 2016-08-17 DIAGNOSIS — E785 Hyperlipidemia, unspecified: Secondary | ICD-10-CM

## 2016-08-17 DIAGNOSIS — I10 Essential (primary) hypertension: Secondary | ICD-10-CM | POA: Diagnosis not present

## 2016-08-17 DIAGNOSIS — R6889 Other general symptoms and signs: Secondary | ICD-10-CM

## 2016-08-17 DIAGNOSIS — Z23 Encounter for immunization: Secondary | ICD-10-CM | POA: Diagnosis not present

## 2016-08-17 DIAGNOSIS — E119 Type 2 diabetes mellitus without complications: Secondary | ICD-10-CM

## 2016-08-17 DIAGNOSIS — Z0001 Encounter for general adult medical examination with abnormal findings: Secondary | ICD-10-CM

## 2016-08-17 HISTORY — DX: Pain in right shoulder: M25.511

## 2016-08-17 HISTORY — DX: Repeated falls: R29.6

## 2016-08-17 MED ORDER — ATORVASTATIN CALCIUM 40 MG PO TABS: 40.0000 mg | ORAL_TABLET | Freq: Every day | ORAL | 3 refills | Status: DC

## 2016-08-17 NOTE — Assessment & Plan Note (Signed)
S/p PT, to cont cane use, safety in home reviewed

## 2016-08-17 NOTE — Assessment & Plan Note (Signed)
Etiology unclear, ? Tendonitis, for sport med referral,  to f/u any worsening symptoms or concerns

## 2016-08-17 NOTE — Progress Notes (Signed)
Pre visit review using our clinic review tool, if applicable. No additional management support is needed unless otherwise documented below in the visit note. 

## 2016-08-17 NOTE — Assessment & Plan Note (Signed)
stable overall by history and exam, recent data reviewed with pt, and pt to continue medical treatment as before,  to f/u any worsening symptoms or concerns Lab Results  Component Value Date   [facility] 63 08/14/2016

## 2016-08-17 NOTE — Progress Notes (Signed)
Subjective:    Patient ID: [name], male    DOB: 14-Sep-1946, 70 y.o.   MRN: [id number]  HPI  Here to f/u; overall doing ok,  Pt denies chest pain, increasing sob or doe, wheezing, orthopnea, PND, increased LE swelling, palpitations, dizziness or syncope.  Pt denies new neurological symptoms such as new headache, or facial or extremity weakness or numbness.  Pt denies polydipsia, polyuria, or low sugar episode.   Pt denies new neurological symptoms such as new headache, or facial or extremity weakness or numbness.   Pt states overall good compliance with meds, mostly trying to follow appropriate diet, with wt overall stable,  but little exercise however. Taking the lipitor 40 mg though not on list.  Finished tx with Vit D.  Having recurring falls with ambulation outside the home despite cane use, is able to hold onto walls with his balance problem in the house without falls No ST, cough, fever.  Having right shoudler pain mild for seveal wks, worse to forward elevation or abduction. No neck pain or RUE other pain, weakness or numbnesss Past Medical History:  Diagnosis Date  . ALLERGIC RHINITIS 11/06/2007  . Anemia, unspecified 08/22/2012  . BENIGN PROSTATIC HYPERTROPHY 07/14/2007  . COLONIC POLYPS, HX OF 07/14/2007  . Complication of anesthesia    pt states he needs to be cathed after surgery  . Degenerative arthritis of right knee 12/12/2011  . GERD 07/11/2007  . Headache(784.0) 07/11/2007  . HYPERLIPIDEMIA 07/11/2007  . HYPERTENSION 07/11/2007  . HYPERTHYROIDISM 07/14/2007  . HYPOTHYROIDISM 11/06/2007  . OSTEOARTHRITIS, KNEE, RIGHT 10/27/2008  . PERIPHERAL EDEMA 05/31/2009  . PULMONARY NODULE 06/24/2008   granuloma  . TIA (transient ischemic attack) 08/13/2015  . Type II or unspecified type diabetes mellitus without mention of complication, uncontrolled 08/22/2012   Patient states that he is not diabetic, not taking any medications  . Vitamin D deficiency 08/12/2015  . WOUND, LEG 05/31/2009    Past Surgical History:  Procedure Laterality Date  . ANTERIOR CERVICAL DECOMP/DISCECTOMY FUSION N/A 01/06/2013   Procedure: ANTERIOR CERVICAL DECOMPRESSION/DISCECTOMY FUSION 1 LEVEL;  Surgeon: Erline Levine, MD;  Location: [facility];  Service: Neurosurgery;  Laterality: N/A;  Cervical three-four Anterior cervical decompression/diskectomy/fusion  . BACK SURGERY  1986   x 2  1986 C 3  . HERNIA REPAIR  sept 1986  . left knee replacement  Nov 21, 2007  . NECK SURGERY    . NISSEN FUNDOPLICATION  6195  . right heel bone spur  1987  . TOTAL KNEE ARTHROPLASTY  01/09/2012   Procedure: TOTAL KNEE ARTHROPLASTY;  Surgeon: Tobi Bastos, MD;  Location: [facility];  Service: Orthopedics;  Laterality: Right;    reports that he quit smoking about 33 years ago. His smoking use included Cigarettes. He has a 90.00 pack-year smoking history. He has never used smokeless tobacco. He reports that he does not drink alcohol or use drugs. family history includes Emphysema in his father; Heart attack in his father and mother; Heart disease in his other. Allergies  Allergen Reactions  . Adhesive [Tape] Other (See Comments)    Rash, reddness   Paper Tape: As far as pt knows, okay.  . Codeine Other (See Comments)    Headaches   . Naproxen Diarrhea  . Prednisone Diarrhea  . Zolpidem Tartrate Other (See Comments)    hallucinations  . Penicillins Rash    whelps   Current Outpatient Prescriptions on File Prior to Visit  Medication Sig Dispense Refill  .  ACCU-CHEK AVIVA PLUS test strip Use to check blood sugars  two times daily 200 each 3  . ACCU-CHEK SOFTCLIX LANCETS lancets Use to check blood sugars twice a day Dx E11.9 200 each 1  . amLODipine (NORVASC) 5 MG tablet Take 1 tablet (5 mg total) by mouth daily. 90 tablet 3  . Blood Glucose Monitoring Suppl (ACCU-CHEK AVIVA PLUS) w/Device KIT Use as directed 1 kit 0  . cetirizine (ZYRTEC) 10 MG tablet Take 1 tablet (10 mg total) by mouth daily. 90 tablet 3  .  clopidogrel (PLAVIX) 75 MG tablet Take 1 tablet by mouth  daily 90 tablet 3  . ergocalciferol (VITAMIN D2) 50000 UNITS capsule Take 1 capsule (50,000 Units total) by mouth once a week. 12 capsule 1  . fenofibrate 160 MG tablet Take 1 tablet (160 mg total) by mouth daily. 90 tablet 3  . glucose blood (ONE TOUCH ULTRA TEST) test strip Use as instructed 100 each 12  . glucose blood test strip 1 each by Other route 2 (two) times daily. Use to check blood sugars twice a day Dx E11.9 300 each 2  . hydrochlorothiazide (HYDRODIURIL) 25 MG tablet Take 1 tablet (25 mg total) by mouth daily. 90 tablet 3  . labetalol (NORMODYNE) 300 MG tablet Take 1 tablet by mouth two  times daily 180 tablet 3  . levothyroxine (SYNTHROID, LEVOTHROID) 125 MCG tablet Take 1 tablet (125 mcg total) by mouth daily. 90 tablet 3  . metFORMIN (GLUCOPHAGE-XR) 500 MG 24 hr tablet Take 1 tablet by mouth  daily with breakfast 90 tablet 3   No current facility-administered medications on file prior to visit.    Review of Systems  Constitutional: Negative for unusual diaphoresis or night sweats HENT: Negative for ear swelling or discharge Eyes: Negative for worsening visual haziness  Respiratory: Negative for choking and stridor.   Gastrointestinal: Negative for distension or worsening eructation Genitourinary: Negative for retention or change in urine volume.  Musculoskeletal: Negative for other MSK pain or swelling Skin: Negative for color change and worsening wound Neurological: Negative for tremors and numbness other than noted  Psychiatric/Behavioral: Negative for decreased concentration or agitation other than above       Objective:   Physical Exam BP (!) 152/88 (BP Location: Right Arm, Patient Position: Sitting, Cuff Size: Normal)   Pulse 77   Temp 98.1 F (36.7 C) (Oral)   Ht 6' (1.829 m)   Wt 208 lb (94.3 kg)   SpO2 97%   BMI 28.21 kg/m  VS noted,  Constitutional: Pt appears in no apparent distress HENT: Head:  NCAT.  Right Ear: External ear normal.  Left Ear: External ear normal.  Eyes: . Pupils are equal, round, and reactive to light. Conjunctivae and EOM are normal Neck: Normal range of motion. Neck supple.  Cardiovascular: Normal rate and regular rhythm.   Pulmonary/Chest: Effort normal and breath sounds without rales or wheezing.  Abd:  Soft, NT, ND, + BS Neurological: Pt is alert. Not confused , motor grossly intact Skin: Skin is warm. No rash, no LE edema Psychiatric: Pt behavior is normal. No agitation.  Right shoulder NT with near FROM    Assessment & Plan:

## 2016-08-17 NOTE — Assessment & Plan Note (Signed)
S/p tx,  to f/u any worsening symptoms or concerns

## 2016-08-17 NOTE — Patient Instructions (Addendum)
You had the flu shot today  Please continue all other medications as before, and refills have been done if requested - including the lipitor  Please have the pharmacy call with any other refills you may need.  Please continue your efforts at being more active, low cholesterol diet, and weight control.  Please keep your appointments with your specialists as you may have planned  You will be contacted regarding the referral for: Dr Tamala Julian (sports medicine) in this office for the right shoulder pain, OR you can make an appointment as you leave for the next available  Please return in 6 months, or sooner if needed, with Lab testing done 3-5 days before

## 2016-08-17 NOTE — Assessment & Plan Note (Signed)
miild elevated, likely reactive, o/w stable overall by history and exam, recent data reviewed with pt, and pt to continue medical treatment as before,  to f/u any worsening symptoms or concerns BP Readings from Last 3 Encounters:  08/17/16 (!) 152/88  02/10/16 136/80  10/12/15 136/84

## 2016-08-17 NOTE — Assessment & Plan Note (Signed)
stable overall by history and exam, recent data reviewed with pt, and pt to continue medical treatment as before,  to f/u any worsening symptoms or concerns Lab Results  Component Value Date   HGBA1C 7.1 (H) 08/14/2016

## 2016-08-20 NOTE — Telephone Encounter (Signed)
Rescheduled for 09/18/16.

## 2016-09-06 NOTE — Telephone Encounter (Signed)
Call to Ms. Marchi and scheduled her and spouse for Tuesday 10/24 for 9:30 and 10:15 respectively. Will come in together.  Will forward to Almyra Free to confirm schedule for health coach is correct

## 2016-09-06 NOTE — Progress Notes (Signed)
[name] [place], [place] [address] Phone: [phone] Subjective:    I'm seeing this patient by the request  of:  Cathlean Cower, MD   [id number] shoulder pain  [id number]  [name] is a 70 y.o. male coming in with complaint of right shoulder pain. Patient states this is been going on for several weeks. Does not remember one specific injury but has had several falls. Seems to be worse when he reaches overhead or laterally to his body. Patient states he can wake him up at night. Patient states Pain seems to be worsening. Patient was told previously that the shoulder pain was coming from his neck and patient did have an ACDF almost 2 years ago. States that that did not help any of the shoulder pain. Continues also have some mild numbness of the arm. Rates the severity of pain a 7 out of 10 and not responding to medications he is taken previously.     Past Medical History:  Diagnosis Date  . ALLERGIC RHINITIS 11/06/2007  . Anemia, unspecified 08/22/2012  . BENIGN PROSTATIC HYPERTROPHY 07/14/2007  . COLONIC POLYPS, HX OF 07/14/2007  . Complication of anesthesia    pt states he needs to be cathed after surgery  . Degenerative arthritis of right knee 12/12/2011  . GERD 07/11/2007  . Headache(784.0) 07/11/2007  . HYPERLIPIDEMIA 07/11/2007  . HYPERTENSION 07/11/2007  . HYPERTHYROIDISM 07/14/2007  . HYPOTHYROIDISM 11/06/2007  . OSTEOARTHRITIS, KNEE, RIGHT 10/27/2008  . PERIPHERAL EDEMA 05/31/2009  . PULMONARY NODULE 06/24/2008   granuloma  . TIA (transient ischemic attack) 08/13/2015  . Type II or unspecified type diabetes mellitus without mention of complication, uncontrolled 08/22/2012   Patient states that he is not diabetic, not taking any medications  . Vitamin D deficiency 08/12/2015  . WOUND, LEG 05/31/2009   Past Surgical History:  Procedure Laterality Date  . ANTERIOR CERVICAL DECOMP/DISCECTOMY FUSION N/A 01/06/2013   Procedure: ANTERIOR  CERVICAL DECOMPRESSION/DISCECTOMY FUSION 1 LEVEL;  Surgeon: Erline Levine, MD;  Location: [facility];  Service: Neurosurgery;  Laterality: N/A;  Cervical three-four Anterior cervical decompression/diskectomy/fusion  . BACK SURGERY  1986   x 2  1986 C 3  . HERNIA REPAIR  sept 1986  . left knee replacement  Nov 21, 2007  . NECK SURGERY    . NISSEN FUNDOPLICATION  [id number]  . right heel bone spur  1987  . TOTAL KNEE ARTHROPLASTY  01/09/2012   Procedure: TOTAL KNEE ARTHROPLASTY;  Surgeon: Tobi Bastos, MD;  Location: [facility];  Service: Orthopedics;  Laterality: Right;   Social History   Social History  . Marital status: Married    Spouse name: [name]  . Number of children: 2  . Years of education: HS   Occupational History  . Retired    Social History Main Topics  . Smoking status: Former Smoker    Packs/day: 3.00    Years: 30.00    Types: Cigarettes    Quit date: 11/26/1982  . Smokeless tobacco: Never Used  . Alcohol use No  . Drug use: No  . Sexual activity: Not Asked   Other Topics Concern  . None   Social History Narrative   Patient lives at home with spouse.   Caffiene Use: 4 cups daily   Allergies  Allergen Reactions  . Adhesive [Tape] Other (See Comments)    Rash, reddness   Paper Tape: As far as pt knows, okay.  . Codeine Other (See Comments)  Headaches   . Naproxen Diarrhea  . Prednisone Diarrhea  . Zolpidem Tartrate Other (See Comments)    hallucinations  . Penicillins Rash    whelps   Family History  Problem Relation Age of Onset  . Heart disease Other   . Heart attack Mother   . Heart attack Father   . Emphysema Father   . Diabetes Neg Hx   . Thyroid disease Neg Hx     Past medical history, social, surgical and family history all reviewed in electronic medical record.  No pertanent information unless stated regarding to the chief complaint.   Review of Systems: No headache, visual changes, nausea, vomiting, diarrhea, constipation, dizziness,  abdominal pain, skin rash, fevers, chills, night sweats, weight loss, swollen lymph nodes, body aches, joint swelling, muscle aches, chest pain, shortness of breath, mood changes.   Objective  Blood pressure 138/80, pulse 68, weight 197 lb (89.4 kg), SpO2 96 %.  General: No apparent distress alert and oriented x3 mood and affect normal, dressed appropriately.  HEENT: Pupils equal, extraocular movements intact  Respiratory: Patient's speak in full sentences and does not appear short of breath  Cardiovascular: No lower extremity edema, non tender, no erythema  Skin: Warm dry intact with no signs of infection or rash on extremities or on axial skeleton.  Abdomen: Soft nontender  Neuro: Cranial nerves II through XII are intact, neurovascularly intact in all extremities with 2+ DTRs and 2+ pulses.  Lymph: No lymphadenopathy of posterior or anterior cervical chain or axillae bilaterally.  Gait normal with good balance and coordination.  MSK:  Non tender with full range of motion and good stability and symmetric strength and tone of elbows, wrist, hip, knee and ankles bilaterally.  Shoulder: Right Inspection reveals mild atrophy but seems to be bilateral. Palpation is normal with no tenderness over AC joint or bicipital groove. Internal range of motion to sacrum 4 out of 5 strength compared to the contralateral side signs of impingement with positive Neer and Hawkin's tests, but negative empty can sign. Speeds and Yergason's tests normal. Positive labral pathology Normal scapular function observed. Positive painful arc No apprehension sign Contralateral shoulder has some mild crepitus with range of motion but 5 out of 5 strength  MSK US performed of: Right This study was ordered, performed, and interpreted by Charlann Boxer D.O.  Shoulder:   Supraspinatus:  Atrophy of the tendon noted with degenerative tearing. Possible full-thickness tear noted Infraspinatus:  Appears normal on long and  transverse views. Significant increase in Doppler flow Subscapularis:  Appears normal on long and transverse views. Positive bursa Teres Minor:  Appears normal on long and transverse views. AC joint:  Moderate degenerative changes Glenohumeral Joint:  Moderate to severe degenerative changes Glenoid Labrum:  Intact without visualized tears. Biceps Tendon:  Appears normal on long and transverse views, no fraying of tendon, tendon located in intertubercular groove, no subluxation with shoulder internal or external rotation.  Impression: Rotator cuff arthropathy  Procedure: Real-time Ultrasound Guided Injection of right glenohumeral joint Device: GE Logiq E  Ultrasound guided injection is preferred based studies that show increased duration, increased effect, greater accuracy, decreased procedural pain, increased response rate with ultrasound guided versus blind injection.  Verbal informed consent obtained.  Time-out conducted.  Noted no overlying erythema, induration, or other signs of local infection.  Skin prepped in a sterile fashion.  Local anesthesia: Topical Ethyl chloride.  With sterile technique and under real time ultrasound guidance:  Joint visualized.  23g 1  inch needle inserted posterior approach. Pictures taken for needle placement. Patient did have injection of 2 cc of 1% lidocaine, 2 cc of 0.5% Marcaine, and 1.0 cc of Kenalog 40 mg/dL. Completed without difficulty  Pain immediately resolved suggesting accurate placement of the medication.  Advised to call if fevers/chills, erythema, induration, drainage, or persistent bleeding.  Images permanently stored and available for review in the ultrasound unit.  Impression: Technically successful ultrasound guided injection.   procedure note [id number]; 15 minutes spent for Therapeutic exercises as stated in above notes.  This included exercises focusing on stretching, strengthening, with significant focus on eccentric aspects.  Shoulder  Exercises that included:  Basic scapular stabilization to include adduction and depression of scapula Scaption, focusing on proper movement and good control Internal and External rotation utilizing a theraband, with elbow tucked at side entire time Rows with theraband   Proper technique shown and discussed handout in great detail with ATC.  All questions were discussed and answered.   Impression and Recommendations:     This case required medical decision making of moderate complexity.      Note: This dictation was prepared with Dragon dictation along with smaller phrase technology. Any transcriptional errors that result from this process are unintentional.

## 2016-09-07 ENCOUNTER — Other Ambulatory Visit: Payer: Self-pay

## 2016-09-07 ENCOUNTER — Encounter: Payer: Self-pay | Admitting: Family Medicine

## 2016-09-07 ENCOUNTER — Ambulatory Visit (INDEPENDENT_AMBULATORY_CARE_PROVIDER_SITE_OTHER): Payer: Medicare Other | Admitting: Family Medicine

## 2016-09-07 VITALS — BP 138/80 | HR 68 | Wt 197.0 lb

## 2016-09-07 DIAGNOSIS — M4802 Spinal stenosis, cervical region: Secondary | ICD-10-CM | POA: Diagnosis not present

## 2016-09-07 DIAGNOSIS — G8929 Other chronic pain: Secondary | ICD-10-CM | POA: Diagnosis not present

## 2016-09-07 DIAGNOSIS — M12819 Other specific arthropathies, not elsewhere classified, unspecified shoulder: Secondary | ICD-10-CM

## 2016-09-07 DIAGNOSIS — M75101 Unspecified rotator cuff tear or rupture of right shoulder, not specified as traumatic: Secondary | ICD-10-CM

## 2016-09-07 DIAGNOSIS — M25511 Pain in right shoulder: Secondary | ICD-10-CM

## 2016-09-07 DIAGNOSIS — M12811 Other specific arthropathies, not elsewhere classified, right shoulder: Secondary | ICD-10-CM

## 2016-09-07 HISTORY — DX: Other specific arthropathies, not elsewhere classified, unspecified shoulder: M12.819

## 2016-09-07 NOTE — Assessment & Plan Note (Signed)
Patient given an injection. Tolerated the procedure well. We discussed icing regimen and home exercise. We discussed which activities to do an which was to avoid. Work with Product/process development scientist. Discussed that we can repeat injection every 12 weeks if necessary. Patient will come back and see me again in 4 weeks to make sure patient is doing well.

## 2016-09-07 NOTE — Assessment & Plan Note (Signed)
Status post fusion. Likely contribute to some of the numbness still on the right upper extremity. Discussed over-the-counter medications a could be beneficial. May consider gabapentin in the long run.

## 2016-09-07 NOTE — Patient Instructions (Signed)
Good to see you.  Ice 20 minutes 2 times daily. Usually after activity and before bed. Exercises 3 times a week.  pennsaid pinkie amount topically 2 times daily as needed.  Vitamin D 2000 IUdaily  Turmeric 500mg  daily  Tart cherry extract any dose you can find at night B12 1019mcg daily  B6 200mg  daily  Try to do activity with your hands within your peripheral vision.  See me again in 4-6 weeks to make sure you are doing better

## 2016-09-09 IMAGING — CR DG CHEST 2V
2 series · 2 of 2 positions shown · non-contrast
Comparison: Portable chest x-ray May 28, 2015 and PA and lateral
chest x-ray December 31, 2012.

CLINICAL DATA: There is no active cardiopulmonary disease.

EXAM:
CHEST  2 VIEW

[w chest pa]
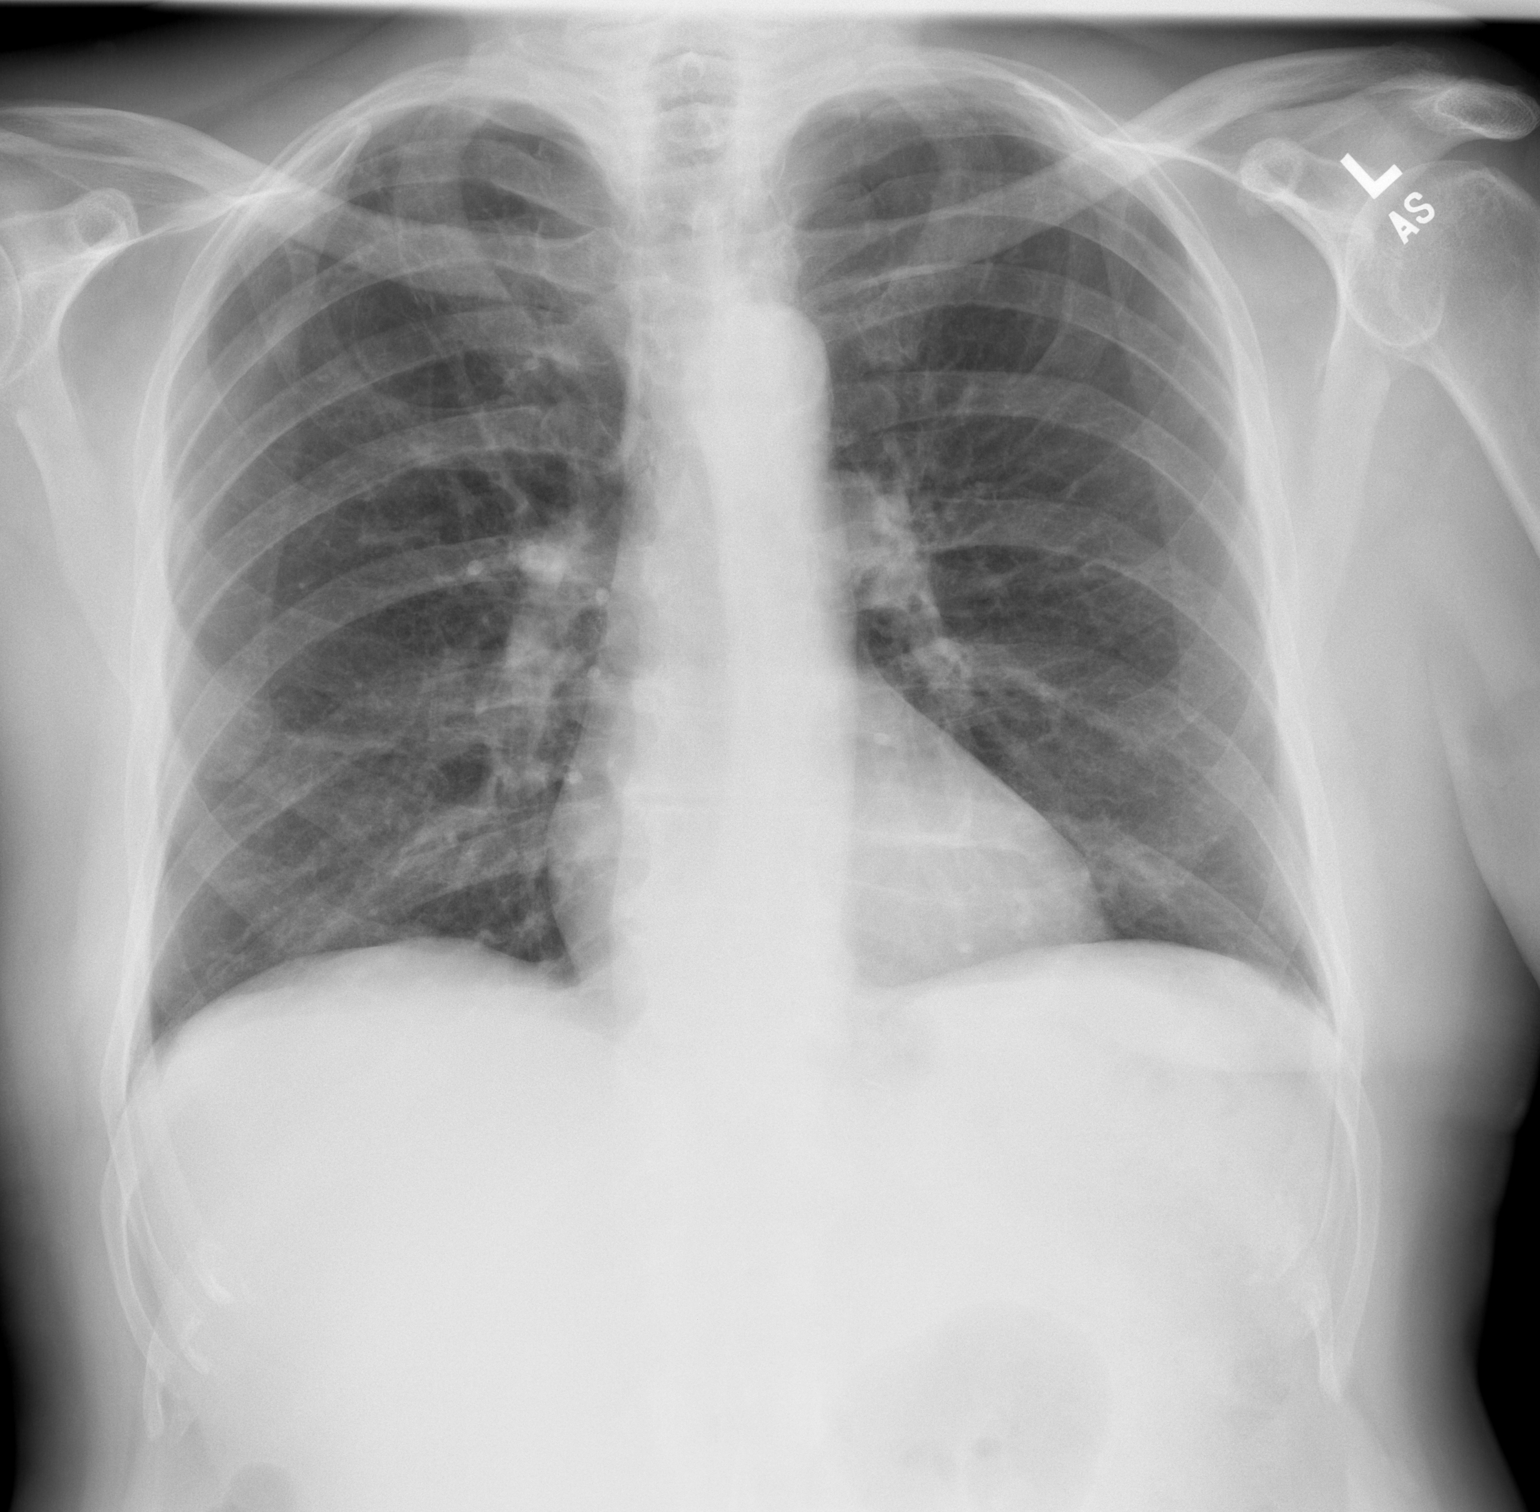

[w chest lat]
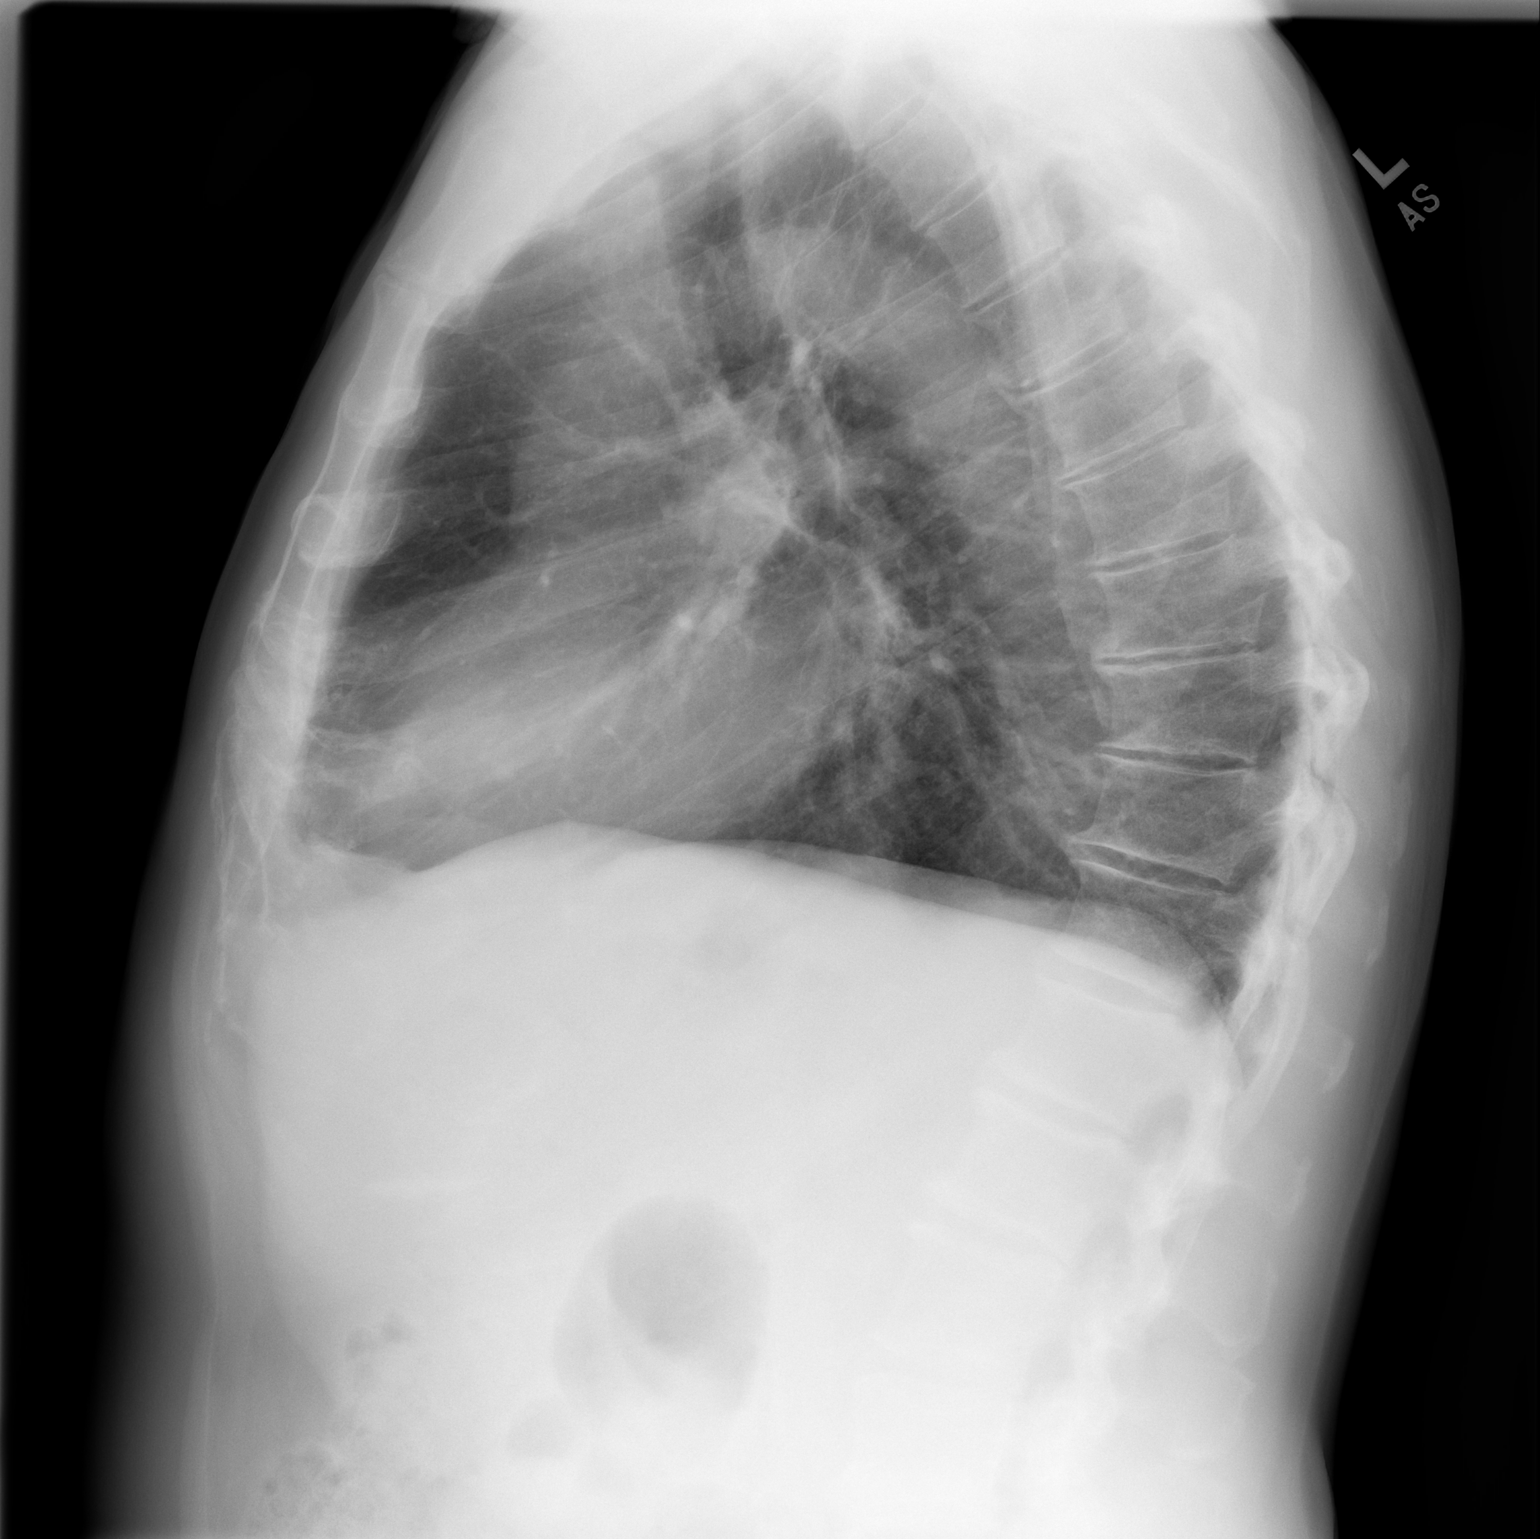

[2 of 2 positions shown; findings below may reference images not displayed]

FINDINGS: The lungs are adequately inflated. There is no focal infiltrate. The
heart and pulmonary vascularity are normal. The mediastinum is
normal in width. There is no pleural effusion. There is mild
multilevel degenerative disc space narrowing of the thoracic spine.
IMPRESSION: No active cardiopulmonary disease.

## 2016-09-09 NOTE — Telephone Encounter (Signed)
They are scheduled for 10/24, Health Coach template is up for Tuesdays and Thursdays.

## 2016-09-11 ENCOUNTER — Ambulatory Visit: Payer: Medicare Other

## 2016-09-13 ENCOUNTER — Other Ambulatory Visit: Payer: Medicare Other

## 2016-09-13 DIAGNOSIS — M25511 Pain in right shoulder: Principal | ICD-10-CM

## 2016-09-13 DIAGNOSIS — G8929 Other chronic pain: Secondary | ICD-10-CM

## 2016-09-17 NOTE — Progress Notes (Signed)
Pre visit review using our clinic review tool, if applicable. No additional management support is needed unless otherwise documented below in the visit note. 

## 2016-09-17 NOTE — Progress Notes (Addendum)
Subjective:   [name] [name] is a 70 y.o. male who presents for an Initial Medicare Annual Wellness Visit.  [name] was informed that the wellness visit is to identify future health risk and educate and initiate measures that can reduce risk for increased disease through the lifespan.   Describes health as fair, good or great? "fair"  Review of Systems  No ROS.  Medicare Wellness Visit.  Cardiac Risk Factors include: advanced age (>67mn, >>92women);diabetes mellitus;family history of premature cardiovascular disease;hypertension;male gender   Sleep patterns: Sleeps 2.5-3 hours nightly. Has problems falling asleep and staying asleep. Has tried melatonin and warm tea without success. Does not desire medical treatment at this time.   Home Safety/Smoke Alarms: Smoke detector in place. Feels safe in home.  Living environment; residence and Firearm Safety: Lives at home with wife and 2 dogs, single story home. Uses ramp to enter home due to difficulty with steps after TIAs. Firearms in safe location.  Seat Belt Safety/Bike Helmet: Wears seatbelt.    Counseling:   Eye Exam-Last exam 3-4 months ago, diabetic eye exam normal. Dr. HSharene Buttersexam 3 years ago. Followed Dr. WJoya Gaskins only with problems.   [name] monitors glucose levels at home every morning. Typical result is 145-150 fasting.   Male:   [facility]S-Colonoscopy 06/21/2009, normal. Recall 10 years.  PSA-02/06/16, 1.01    Objective:    Today's Vitals   09/18/16 0955  BP: (!) 150/78  Pulse: 60  SpO2: 95%  Weight: 197 lb (89.4 kg)  Height: 6' (1.829 m)  PainSc: 3    Body mass index is 26.72 kg/m.  Current Medications (verified) Outpatient Encounter Prescriptions as of 09/18/2016  Medication Sig  . ACCU-CHEK AVIVA PLUS test strip Use to check blood sugars  two times daily  . ACCU-CHEK SOFTCLIX LANCETS lancets Use to check blood sugars twice a day Dx E11.9  . amLODipine (NORVASC) 5 MG tablet Take 1 tablet (5  mg total) by mouth daily.  . Ascorbic Acid (VITAMIN C) 1000 MG tablet Take 1,000 mg by mouth daily.  .Marland Kitchenatorvastatin (LIPITOR) 40 MG tablet Take 1 tablet (40 mg total) by mouth daily.  . Blood Glucose Monitoring Suppl (ACCU-CHEK AVIVA PLUS) w/Device KIT Use as directed  . cetirizine (ZYRTEC) 10 MG tablet Take 1 tablet (10 mg total) by mouth daily.  . clopidogrel (PLAVIX) 75 MG tablet Take 1 tablet by mouth  daily  . Coenzyme Q10 (CO Q-10 MAXIMUM STRENGTH) 400 MG CAPS Take 1 capsule by mouth daily.  . fenofibrate 160 MG tablet Take 1 tablet (160 mg total) by mouth daily.  .Marland Kitchenglucose blood (ONE TOUCH ULTRA TEST) test strip Use as instructed  . glucose blood test strip 1 each by Other route 2 (two) times daily. Use to check blood sugars twice a day Dx E11.9  . hydrochlorothiazide (HYDRODIURIL) 25 MG tablet Take 1 tablet (25 mg total) by mouth daily.  .Marland Kitchenlabetalol (NORMODYNE) 300 MG tablet Take 1 tablet by mouth two  times daily  . levothyroxine (SYNTHROID, LEVOTHROID) 125 MCG tablet Take 1 tablet (125 mcg total) by mouth daily.  . metFORMIN (GLUCOPHAGE-XR) 500 MG 24 hr tablet Take 1 tablet by mouth  daily with breakfast  . Omega-3 Fatty Acids (FISH OIL) 1000 MG CAPS Take 2 capsules by mouth daily.   No facility-administered encounter medications on file as of 09/18/2016.     Allergies (verified) Adhesive [tape]; Codeine; Naproxen; Prednisone; Zolpidem tartrate; and Penicillins   History: Past Medical  History:  Diagnosis Date  . ALLERGIC RHINITIS 11/06/2007  . Anemia, unspecified 08/22/2012  . BENIGN PROSTATIC HYPERTROPHY 07/14/2007  . COLONIC POLYPS, HX OF 07/14/2007  . Complication of anesthesia    pt states he needs to be cathed after surgery  . Degenerative arthritis of right knee 12/12/2011  . GERD 07/11/2007  . Headache(784.0) 07/11/2007  . HYPERLIPIDEMIA 07/11/2007  . HYPERTENSION 07/11/2007  . HYPERTHYROIDISM 07/14/2007  . HYPOTHYROIDISM 11/06/2007  . OSTEOARTHRITIS, KNEE, RIGHT  10/27/2008  . PERIPHERAL EDEMA 05/31/2009  . PULMONARY NODULE 06/24/2008   granuloma  . TIA (transient ischemic attack) 08/13/2015  . Type II or unspecified type diabetes mellitus without mention of complication, uncontrolled 08/22/2012   [name] states that he is not diabetic, not taking any medications  . Vitamin D deficiency 08/12/2015  . WOUND, LEG 05/31/2009   Past Surgical History:  Procedure Laterality Date  . ANTERIOR CERVICAL DECOMP/DISCECTOMY FUSION N/A 01/06/2013   Procedure: ANTERIOR CERVICAL DECOMPRESSION/DISCECTOMY FUSION 1 LEVEL;  Surgeon: Erline Levine, MD;  Location: [facility];  Service: Neurosurgery;  Laterality: N/A;  Cervical three-four Anterior cervical decompression/diskectomy/fusion  . BACK SURGERY  1986   x 2  1986 C 3  . HERNIA REPAIR  sept 1986  . left knee replacement  Nov 21, 2007  . NECK SURGERY    . NISSEN FUNDOPLICATION  [id number]  . right heel bone spur  1987  . TOTAL KNEE ARTHROPLASTY  01/09/2012   Procedure: TOTAL KNEE ARTHROPLASTY;  Surgeon: Tobi Bastos, MD;  Location: [facility];  Service: Orthopedics;  Laterality: Right;   Family History  Problem Relation Age of Onset  . Heart attack Mother   . Heart attack Father   . Emphysema Father   . Heart disease Other   . Diabetes Neg Hx   . Thyroid disease Neg Hx    Social History   Occupational History  . Retired    Social History Main Topics  . Smoking status: Former Smoker    Packs/day: 3.00    Years: 30.00    Types: Cigarettes    Quit date: 11/26/1982  . Smokeless tobacco: Never Used  . Alcohol use No  . Drug use: No  . Sexual activity: Not on file   Tobacco Counseling Counseling given: Not Answered   Activities of Daily Living In your present state of health, do you have any difficulty performing the following activities: 09/18/2016  Hearing? N  Vision? N  Difficulty concentrating or making decisions? N  Walking or climbing stairs? Y  Dressing or bathing? N  Doing errands, shopping? N    Preparing Food and eating ? N  Using the Toilet? N  In the past six months, have you accidently leaked urine? N  Do you have problems with loss of bowel control? N  Managing your Medications? N  Managing your Finances? N  Housekeeping or managing your Housekeeping? N  Some recent data might be hidden    Immunizations and Health Maintenance Immunization History  Administered Date(s) Administered  . Influenza Split 09/09/2012  . Influenza, High Dose Seasonal PF 08/17/2016  . Influenza,inj,Quad PF,36+ Mos 08/06/2013, 08/06/2014, 08/12/2015  . Pneumococcal Conjugate-13 02/03/2014  . Pneumococcal Polysaccharide-23 11/26/2005, 08/22/2012  . Td 12/23/1996, 05/31/2009  . Zoster 07/23/2006   Health Maintenance Due  Topic Date Due  . OPHTHALMOLOGY EXAM  02/26/1956  . URINE MICROALBUMIN  08/04/2016    [name] Care Team: Biagio Borg, MD as PCP - [place] (Ophthalmology) [place] (Dentistry)  Indicate any recent Medical Services you may have received from other than [facility] providers in the past year (date may be approximate).    Assessment:   This is a routine wellness examination for [name] [name].  Physical assessment deferred to PCP.   Hearing/Vision screen No exam data present  Dietary issues and exercise activities discussed: Current Exercise Habits: [name] does not participate in regular exercise at present [facility] to mailbox and end of road. ), Type of exercise: walking, Exercise limited by: cardiac condition(s);neurologic condition(s);orthopedic condition(s);respiratory conditions(s)   Diet (meal preparation, eat out, water intake, caffeinated beverages, dairy products, fruits and vegetables): prepares his own meals (2 meals/day). Drinks 24-32 oz of water daily and caffeine coffee (12 cups/day) throughout day.   Breakfast: 2 eggs, tomato, toast, grape jelly, coffee Lunch: Skips lunch, has snacks (crackers, peanut butter, popcorn) Dinner: Meat, 2  vegetables.   Encouraged [name] to decrease daily amount of coffee. Discussed low carb diet and not skipping meals.   Goals      [name] Stated   . [name] (pt-stated)          "make my speech better"      Depression Screen PHQ 2/9 Scores 09/18/2016 02/10/2016 08/12/2015 02/03/2014  PHQ - 2 Score 0 0 0 0    Fall Risk Fall Risk  09/18/2016 02/10/2016 08/12/2015 02/03/2014 08/06/2013  Falls in the past year? Yes No No No Yes  Number falls in past yr: 1 - - - 2 or more  Injury with Fall? No - - - -  Risk Factor Category  - - - - High Fall Risk  Risk for fall due to : Impaired balance/gait - - - History of fall(s)  Follow up Falls prevention discussed - - - -    Cognitive Function: MMSE - Mini Mental State Exam 09/18/2016  Orientation to time 5  Orientation to Place 5  Registration 3  Attention/ Calculation 1  Recall 2  Language- name 2 objects 2  Language- repeat 1  Language- follow 3 step command 3  Language- read & follow direction 1  Write a sentence 1  Copy design 1  Total score 25        Screening Tests Health Maintenance  Topic Date Due  . OPHTHALMOLOGY EXAM  02/26/1956  . URINE MICROALBUMIN  08/04/2016  . FOOT EXAM  02/09/2017  . HEMOGLOBIN A1C  02/11/2017  . TETANUS/TDAP  06/01/2019  . COLONOSCOPY  06/22/2019  . INFLUENZA VACCINE  Addressed  . ZOSTAVAX  Completed  . Hepatitis C Screening  Completed  . PNA vac Low Risk Adult  Completed        Plan:     Eat heart healthy diet (full of fruits, vegetables, whole grains, lean protein, water--limit salt, fat, and sugar intake) and increase physical activity as tolerated.  Continue doing brain stimulating activities (puzzles, reading, adult coloring books, staying active) to keep memory sharp.   Follow up with Dr. Katrinka Blazing and Dr. Jonny Ruiz as scheduled.   [name] concerns- -[name] would like to improve his speech with the help of speech therapy -[name] is concerned about gait/steadiness, would like physical  therapy.   During the course of the visit Advay was educated and counseled about the following appropriate screening and preventive services:   Vaccines to include Pneumoccal, Influenza, Hepatitis B, Td, Zostavax, HCV  Colorectal cancer screening  Cardiovascular disease screening  Diabetes screening  Glaucoma screening  Nutrition counseling  Prostate cancer screening  [name] Instructions (the written plan) were given  to [name].   Gerilyn Nestle, RN   09/18/2016   Medical screening examination/treatment/procedure(s) were performed by non-physician practitioner and as supervising physician I was immediately available for consultation/collaboration. I agree with above. Cathlean Cower, MD

## 2016-09-18 ENCOUNTER — Other Ambulatory Visit: Payer: Self-pay | Admitting: Internal Medicine

## 2016-09-18 ENCOUNTER — Ambulatory Visit (INDEPENDENT_AMBULATORY_CARE_PROVIDER_SITE_OTHER): Payer: Medicare Other

## 2016-09-18 VITALS — BP 150/78 | HR 60 | Ht 72.0 in | Wt 197.0 lb

## 2016-09-18 DIAGNOSIS — Z0000 Encounter for general adult medical examination without abnormal findings: Secondary | ICD-10-CM

## 2016-09-18 DIAGNOSIS — G459 Transient cerebral ischemic attack, unspecified: Secondary | ICD-10-CM

## 2016-09-18 NOTE — Patient Instructions (Addendum)
Eat heart healthy diet (full of fruits, vegetables, whole grains, lean protein, water--limit salt, fat, and sugar intake) and increase physical activity as tolerated.  Continue doing brain stimulating activities (puzzles, reading, adult coloring books, staying active) to keep memory sharp.   Follow up with Dr. Tamala [name] and Dr. Jenny [name] as scheduled.   Fall Prevention in the Home  Falls can cause injuries. They can happen to people of all ages. There are many things you can do to make your home safe and to help prevent falls.  WHAT CAN I DO ON THE OUTSIDE OF MY HOME?  Regularly fix the edges of walkways and driveways and fix any cracks.  Remove anything that might make you trip as you walk through a door, such as a raised step or threshold.  Trim any bushes or trees on the path to your home.  Use bright outdoor lighting.  Clear any walking paths of anything that might make someone trip, such as rocks or tools.  Regularly check to see if handrails are loose or broken. Make sure that both sides of any steps have handrails.  Any raised decks and porches should have guardrails on the edges.  Have any leaves, snow, or ice cleared regularly.  Use sand or salt on walking paths during winter.  Clean up any spills in your garage right away. This includes oil or grease spills. WHAT CAN I DO IN THE BATHROOM?   Use night lights.  Install grab bars by the toilet and in the tub and shower. Do not use towel bars as grab bars.  Use non-skid mats or decals in the tub or shower.  If you need to sit down in the shower, use a plastic, non-slip stool.  Keep the floor dry. Clean up any water that spills on the floor as soon as it happens.  Remove soap buildup in the tub or shower regularly.  Attach bath mats securely with double-sided non-slip rug tape.  Do not have throw rugs and other things on the floor that can make you trip. WHAT CAN I DO IN THE BEDROOM?  Use night lights.  Make sure that  you have a light by your bed that is easy to reach.  Do not use any sheets or blankets that are too big for your bed. They should not hang down onto the floor.  Have a firm chair that has side arms. You can use this for support while you get dressed.  Do not have throw rugs and other things on the floor that can make you trip. WHAT CAN I DO IN THE KITCHEN?  Clean up any spills right away.  Avoid walking on wet floors.  Keep items that you use a lot in easy-to-reach places.  If you need to reach something above you, use a strong step stool that has a grab bar.  Keep electrical cords out of the way.  Do not use floor polish or wax that makes floors slippery. If you must use wax, use non-skid floor wax.  Do not have throw rugs and other things on the floor that can make you trip. WHAT CAN I DO WITH MY STAIRS?  Do not leave any items on the stairs.  Make sure that there are handrails on both sides of the stairs and use them. Fix handrails that are broken or loose. Make sure that handrails are as long as the stairways.  Check any carpeting to make sure that it is firmly attached to the stairs. Fix  any carpet that is loose or worn.  Avoid having throw rugs at the top or bottom of the stairs. If you do have throw rugs, attach them to the floor with carpet tape.  Make sure that you have a light switch at the top of the stairs and the bottom of the stairs. If you do not have them, ask someone to add them for you. WHAT ELSE CAN I DO TO HELP PREVENT FALLS?  Wear shoes that:  Do not have high heels.  Have rubber bottoms.  Are comfortable and fit you well.  Are closed at the toe. Do not wear sandals.  If you use a stepladder:  Make sure that it is fully opened. Do not climb a closed stepladder.  Make sure that both sides of the stepladder are locked into place.  Ask someone to hold it for you, if possible.  Clearly mark and make sure that you can see:  Any grab bars or  handrails.  First and last steps.  Where the edge of each step is.  Use tools that help you move around (mobility aids) if they are needed. These include:  Canes.  Walkers.  Scooters.  Crutches.  Turn on the lights when you go into a dark area. Replace any light bulbs as soon as they burn out.  Set up your furniture so you have a clear path. Avoid moving your furniture around.  If any of your floors are uneven, fix them.  If there are any pets around you, be aware of where they are.  Review your medicines with your doctor. Some medicines can make you feel dizzy. This can increase your chance of falling. Ask your doctor what other things that you can do to help prevent falls.   This information is not intended to replace advice given to you by your health care provider. Make sure you discuss any questions you have with your health care provider.   Document Released: 09/08/2009 Document Revised: 03/29/2015 Document Reviewed: 12/17/2014 Elsevier Interactive Patient Education 2016 [place] Maintenance, Male A healthy lifestyle and preventative care can promote health and wellness.  Maintain regular health, dental, and eye exams.  Eat a healthy diet. Foods like vegetables, fruits, whole grains, low-fat dairy products, and lean protein foods contain the nutrients you need and are low in calories. Decrease your intake of foods high in solid fats, added sugars, and salt. Get information about a proper diet from your health care provider, if necessary.  Regular physical exercise is one of the most important things you can do for your health. Most adults should get at least 150 minutes of moderate-intensity exercise (any activity that increases your heart rate and causes you to sweat) each week. In addition, most adults need muscle-strengthening exercises on 2 or more days a week.   Maintain a healthy weight. The body mass index (BMI) is a screening tool to identify  possible weight problems. It provides an estimate of body fat based on height and weight. Your health care provider can find your BMI and can help you achieve or maintain a healthy weight. For males 20 years and older:  A BMI below 18.5 is considered underweight.  A BMI of 18.5 to 24.9 is normal.  A BMI of 25 to 29.9 is considered overweight.  A BMI of 30 and above is considered obese.  Maintain normal blood lipids and cholesterol by exercising and minimizing your intake of saturated fat. Eat a balanced diet with plenty  of fruits and vegetables. Blood tests for lipids and cholesterol should begin at age 41 and be repeated every 5 years. If your lipid or cholesterol levels are high, you are over age 76, or you are at high risk for heart disease, you may need your cholesterol levels checked more frequently.Ongoing high lipid and cholesterol levels should be treated with medicines if diet and exercise are not working.  If you smoke, find out from your health care provider how to quit. If you do not use tobacco, do not start.  Lung cancer screening is recommended for adults aged 32-80 years who are at high risk for developing lung cancer because of a history of smoking. A yearly low-dose CT scan of the lungs is recommended for people who have at least a 30-pack-year history of smoking and are current smokers or have quit within the past 15 years. A pack year of smoking is smoking an average of 1 pack of cigarettes a day for 1 year (for example, a 30-pack-year history of smoking could mean smoking 1 pack a day for 30 years or 2 packs a day for 15 years). Yearly screening should continue until the smoker has stopped smoking for at least 15 years. Yearly screening should be stopped for people who develop a health problem that would prevent them from having lung cancer treatment.  If you choose to drink alcohol, do not have more than 2 drinks per day. One drink is considered to be 12 oz (360 mL) of beer, 5  oz (150 mL) of wine, or 1.5 oz (45 mL) of liquor.  Avoid the use of street drugs. Do not share needles with anyone. Ask for help if you need support or instructions about stopping the use of drugs.  High blood pressure causes heart disease and increases the risk of stroke. High blood pressure is more likely to develop in:  People who have blood pressure in the end of the normal range (100-139/85-89 mm Hg).  People who are overweight or obese.  People who are African American.  If you are 41-2 years of age, have your blood pressure checked every 3-5 years. If you are 18 years of age or older, have your blood pressure checked every year. You should have your blood pressure measured twice--once when you are at a hospital or clinic, and once when you are not at a hospital or clinic. Record the average of the two measurements. To check your blood pressure when you are not at a hospital or clinic, you can use:  An automated blood pressure machine at a pharmacy.  A home blood pressure monitor.  If you are 35-16 years old, ask your health care provider if you should take aspirin to prevent heart disease.  Diabetes screening involves taking a blood sample to check your fasting blood sugar level. This should be done once every 3 years after age 84 if you are at a normal weight and without risk factors for diabetes. Testing should be considered at a younger age or be carried out more frequently if you are overweight and have at least 1 risk factor for diabetes.  Colorectal cancer can be detected and often prevented. Most routine colorectal cancer screening begins at the age of 21 and continues through age 28. However, your health care provider may recommend screening at an earlier age if you have risk factors for colon cancer. On a yearly basis, your health care provider may provide home test kits to check for hidden blood  in the stool. A small camera at the end of a tube may be used to directly examine  the colon (sigmoidoscopy or colonoscopy) to detect the earliest forms of colorectal cancer. Talk to your health care provider about this at age 24 when routine screening begins. A direct exam of the colon should be repeated every 5-10 years through age 62, unless early forms of precancerous polyps or small growths are found.  People who are at an increased risk for hepatitis B should be screened for this virus. You are considered at high risk for hepatitis B if:  You were born in a country where hepatitis B occurs often. Talk with your health care provider about which countries are considered high risk.  Your parents were born in a high-risk country and you have not received a shot to protect against hepatitis B (hepatitis B vaccine).  You have HIV or AIDS.  You use needles to inject street drugs.  You live with, or have sex with, someone who has hepatitis B.  You are a man who has sex with other men (MSM).  You get hemodialysis treatment.  You take certain medicines for conditions like cancer, organ transplantation, and autoimmune conditions.  Hepatitis C blood testing is recommended for all people born from [birth year] through 1967 and any individual with known risk factors for hepatitis C.  Healthy men should no longer receive prostate-specific antigen (PSA) blood tests as part of routine cancer screening. Talk to your health care provider about prostate cancer screening.  Testicular cancer screening is not recommended for adolescents or adult males who have no symptoms. Screening includes self-exam, a health care provider exam, and other screening tests. Consult with your health care provider about any symptoms you have or any concerns you have about testicular cancer.  Practice safe sex. Use condoms and avoid high-risk sexual practices to reduce the spread of sexually transmitted infections (STIs).  You should be screened for STIs, including gonorrhea and chlamydia if:  You are sexually  active and are younger than 24 years.  You are older than 24 years, and your health care provider tells you that you are at risk for this type of infection.  Your sexual activity has changed since you were last screened, and you are at an increased risk for chlamydia or gonorrhea. Ask your health care provider if you are at risk.  If you are at risk of being infected with HIV, it is recommended that you take a prescription medicine daily to prevent HIV infection. This is called pre-exposure prophylaxis (PrEP). You are considered at risk if:  You are a man who has sex with other men (MSM).  You are a heterosexual man who is sexually active with multiple partners.  You take drugs by injection.  You are sexually active with a partner who has HIV.  Talk with your health care provider about whether you are at high risk of being infected with HIV. If you choose to begin PrEP, you should first be tested for HIV. You should then be tested every 3 months for as long as you are taking PrEP.  Use sunscreen. Apply sunscreen liberally and repeatedly throughout the day. You should seek shade when your shadow is shorter than you. Protect yourself by wearing long sleeves, pants, a wide-brimmed hat, and sunglasses year round whenever you are outdoors.  Tell your health care provider of new moles or changes in moles, especially if there is a change in shape or color. Also, tell your  health care provider if a mole is larger than the size of a pencil eraser.  A one-time screening for abdominal aortic aneurysm (AAA) and surgical repair of large AAAs by ultrasound is recommended for men aged 72-75 years who are current or former smokers.  Stay current with your vaccines (immunizations).   This information is not intended to replace advice given to you by your health care provider. Make sure you discuss any questions you have with your health care provider.   Document Released: 05/10/2008 Document Revised:  12/03/2014 Document Reviewed: 04/09/2011 Elsevier Interactive Patient Education [employer].

## 2016-09-19 ENCOUNTER — Telehealth: Payer: Self-pay

## 2016-09-19 NOTE — Telephone Encounter (Signed)
-----   Message from Biagio Borg, MD sent at 09/18/2016 12:57 PM EDT ----- Regarding: Laqueta Carina for both, thanks, I have ordered ----- Message ----- From: Gerilyn Nestle, RN Sent: 09/18/2016  11:18 AM To: Biagio Borg, MD  Patient was in this morning for his AWV. He is requesting a referral for physical and speech therapy to improve gait and speech d/t TIA's. Please advise.

## 2016-10-09 DIAGNOSIS — I69922 Dysarthria following unspecified cerebrovascular disease: Secondary | ICD-10-CM | POA: Diagnosis not present

## 2016-10-09 DIAGNOSIS — G459 Transient cerebral ischemic attack, unspecified: Secondary | ICD-10-CM | POA: Diagnosis not present

## 2016-10-09 DIAGNOSIS — R2689 Other abnormalities of gait and mobility: Secondary | ICD-10-CM | POA: Diagnosis not present

## 2016-10-09 DIAGNOSIS — M6281 Muscle weakness (generalized): Secondary | ICD-10-CM | POA: Diagnosis not present

## 2016-10-09 DIAGNOSIS — R2681 Unsteadiness on feet: Secondary | ICD-10-CM | POA: Diagnosis not present

## 2016-10-11 NOTE — Progress Notes (Signed)
[name] [address] [place], [place] [address] Phone: [phone] Subjective:    I'm seeing this patient by the request  of:  Cathlean Cower, MD   [id number] shoulder pain f/u  [id number]  [name] is a 70 y.o. male coming in with complaint of right shoulder pain. Patient was found to have more of a rotator cuff arthropathy as well as some acromioclavicular arthritis. Patient's was given a glenohumeral injection on 09/07/2016. Was to do conservative therapy otherwise. Patient does have the past medical history significant for an ACDF in the cervical spine. Patient states Doing 75-85% better. Not having as much pain. Feeling good overall. Denies any numbness or tingling. Still some mild discomfort from time to time but nothing severe.     Past Medical History:  Diagnosis Date  . ALLERGIC RHINITIS 11/06/2007  . Anemia, unspecified 08/22/2012  . BENIGN PROSTATIC HYPERTROPHY 07/14/2007  . COLONIC POLYPS, HX OF 07/14/2007  . Complication of anesthesia    pt states he needs to be cathed after surgery  . Degenerative arthritis of right knee 12/12/2011  . GERD 07/11/2007  . Headache(784.0) 07/11/2007  . HYPERLIPIDEMIA 07/11/2007  . HYPERTENSION 07/11/2007  . HYPERTHYROIDISM 07/14/2007  . HYPOTHYROIDISM 11/06/2007  . OSTEOARTHRITIS, KNEE, RIGHT 10/27/2008  . PERIPHERAL EDEMA 05/31/2009  . PULMONARY NODULE 06/24/2008   granuloma  . TIA (transient ischemic attack) 08/13/2015  . Type II or unspecified type diabetes mellitus without mention of complication, uncontrolled 08/22/2012   Patient states that he is not diabetic, not taking any medications  . Vitamin D deficiency 08/12/2015  . WOUND, LEG 05/31/2009   Past Surgical History:  Procedure Laterality Date  . ANTERIOR CERVICAL DECOMP/DISCECTOMY FUSION N/A 01/06/2013   Procedure: ANTERIOR CERVICAL DECOMPRESSION/DISCECTOMY FUSION 1 LEVEL;  Surgeon: Erline Levine, MD;  Location: [facility];  Service: Neurosurgery;   Laterality: N/A;  Cervical three-four Anterior cervical decompression/diskectomy/fusion  . BACK SURGERY  1986   x 2  1986 C 3  . HERNIA REPAIR  sept 1986  . left knee replacement  Nov 21, 2007  . NECK SURGERY    . NISSEN FUNDOPLICATION  [id number]  . right heel bone spur  1987  . TOTAL KNEE ARTHROPLASTY  01/09/2012   Procedure: TOTAL KNEE ARTHROPLASTY;  Surgeon: Tobi Bastos, MD;  Location: [facility];  Service: Orthopedics;  Laterality: Right;   Social History   Social History  . Marital status: Married    Spouse name: [name]  . Number of children: 2  . Years of education: HS   Occupational History  . Retired    Social History Main Topics  . Smoking status: Former Smoker    Packs/day: 3.00    Years: 30.00    Types: Cigarettes    Quit date: 11/26/1982  . Smokeless tobacco: Never Used  . Alcohol use No  . Drug use: No  . Sexual activity: Not Asked   Other Topics Concern  . None   Social History Narrative   Patient lives at home with spouse.   Caffiene Use: 4 cups daily   Allergies  Allergen Reactions  . Adhesive [Tape] Other (See Comments)    Rash, reddness   Paper Tape: As far as pt knows, okay.  . Codeine Other (See Comments)    Headaches   . Naproxen Diarrhea  . Prednisone Diarrhea  . Zolpidem Tartrate Other (See Comments)    hallucinations  . Penicillins Rash    whelps   Family History  Problem Relation Age of Onset  . Heart attack Mother   . Heart attack Father   . Emphysema Father   . Heart disease Other   . Diabetes Neg Hx   . Thyroid disease Neg Hx     Past medical history, social, surgical and family history all reviewed in electronic medical record.  No pertanent information unless stated regarding to the chief complaint.   Review of Systems: No headache, visual changes, nausea, vomiting, diarrhea, constipation, dizziness, abdominal pain, skin rash, fevers, chills, night sweats, weight loss, swollen lymph nodes, chest pain, shortness of breath, mood  changes.   Objective  Blood pressure 128/84, pulse (!) 58, height 6' (1.829 m), weight 198 lb (89.8 kg), SpO2 95 %.  General: No apparent distress alert and oriented x3 mood and affect normal, dressed appropriately.  HEENT: Pupils equal, extraocular movements intact  Respiratory: Patient's speak in full sentences and does not appear short of breath  Cardiovascular: No lower extremity edema, non tender, no erythema  Skin: Warm dry intact with no signs of infection or rash on extremities or on axial skeleton.  Abdomen: Soft nontender  Neuro: Cranial nerves II through XII are intact, neurovascularly intact in all extremities with 2+ DTRs and 2+ pulses.  Lymph: No lymphadenopathy of posterior or anterior cervical chain or axillae bilaterally.  Gait normal with good balance and coordination.  MSK:  Non tender with full range of motion and good stability and symmetric strength and tone of elbows, wrist, hip, knee and ankles bilaterally.  Shoulder: Right Inspection reveals mild atrophy but seems to be bilateral. Pain over the acromioclavicular joint 4 out of 5 strength compared to the contralateral side signs of impingement with positive Neer and Hawkin's tests, but negative empty can sign. Speeds and Yergason's tests normal. Positive labral pathology Normal scapular function observed. No apprehension sign Contralateral shoulder has some mild crepitus with range of motion but 5 out of 5 strength   Impression and Recommendations:     This case required medical decision making of moderate complexity.      Note: This dictation was prepared with Dragon dictation along with smaller phrase technology. Any transcriptional errors that result from this process are unintentional.

## 2016-10-12 ENCOUNTER — Ambulatory Visit (INDEPENDENT_AMBULATORY_CARE_PROVIDER_SITE_OTHER): Payer: Medicare Other | Admitting: Family Medicine

## 2016-10-12 ENCOUNTER — Encounter: Payer: Self-pay | Admitting: Family Medicine

## 2016-10-12 VITALS — BP 128/84 | HR 58 | Ht 72.0 in | Wt 198.0 lb

## 2016-10-12 DIAGNOSIS — M4802 Spinal stenosis, cervical region: Secondary | ICD-10-CM | POA: Diagnosis not present

## 2016-10-12 DIAGNOSIS — M12811 Other specific arthropathies, not elsewhere classified, right shoulder: Secondary | ICD-10-CM

## 2016-10-12 DIAGNOSIS — M75101 Unspecified rotator cuff tear or rupture of right shoulder, not specified as traumatic: Secondary | ICD-10-CM

## 2016-10-12 NOTE — Assessment & Plan Note (Signed)
Patient is doing significant better after the injection. I do think that there is some spinal stenosis. Patient will continue on the gabapentin. Otherwise patient can see me as needed knowing that we can repeat injection every 3-4 months. Also consider acromial clavicular injection if needed.

## 2016-10-16 DIAGNOSIS — R2689 Other abnormalities of gait and mobility: Secondary | ICD-10-CM | POA: Diagnosis not present

## 2016-10-16 DIAGNOSIS — R2681 Unsteadiness on feet: Secondary | ICD-10-CM | POA: Diagnosis not present

## 2016-10-16 DIAGNOSIS — I69922 Dysarthria following unspecified cerebrovascular disease: Secondary | ICD-10-CM | POA: Diagnosis not present

## 2016-10-16 DIAGNOSIS — M6281 Muscle weakness (generalized): Secondary | ICD-10-CM | POA: Diagnosis not present

## 2016-10-16 DIAGNOSIS — G459 Transient cerebral ischemic attack, unspecified: Secondary | ICD-10-CM | POA: Diagnosis not present

## 2016-10-23 DIAGNOSIS — I69922 Dysarthria following unspecified cerebrovascular disease: Secondary | ICD-10-CM | POA: Diagnosis not present

## 2016-10-23 DIAGNOSIS — G459 Transient cerebral ischemic attack, unspecified: Secondary | ICD-10-CM | POA: Diagnosis not present

## 2016-10-23 DIAGNOSIS — R2689 Other abnormalities of gait and mobility: Secondary | ICD-10-CM | POA: Diagnosis not present

## 2016-10-23 DIAGNOSIS — M6281 Muscle weakness (generalized): Secondary | ICD-10-CM | POA: Diagnosis not present

## 2016-10-23 DIAGNOSIS — R2681 Unsteadiness on feet: Secondary | ICD-10-CM | POA: Diagnosis not present

## 2016-10-30 DIAGNOSIS — I69922 Dysarthria following unspecified cerebrovascular disease: Secondary | ICD-10-CM | POA: Diagnosis not present

## 2016-10-30 DIAGNOSIS — G459 Transient cerebral ischemic attack, unspecified: Secondary | ICD-10-CM | POA: Diagnosis not present

## 2016-11-02 DIAGNOSIS — G459 Transient cerebral ischemic attack, unspecified: Secondary | ICD-10-CM | POA: Diagnosis not present

## 2016-11-02 DIAGNOSIS — I69922 Dysarthria following unspecified cerebrovascular disease: Secondary | ICD-10-CM | POA: Diagnosis not present

## 2016-11-06 DIAGNOSIS — G459 Transient cerebral ischemic attack, unspecified: Secondary | ICD-10-CM | POA: Diagnosis not present

## 2016-11-06 DIAGNOSIS — I69922 Dysarthria following unspecified cerebrovascular disease: Secondary | ICD-10-CM | POA: Diagnosis not present

## 2016-11-13 DIAGNOSIS — I69922 Dysarthria following unspecified cerebrovascular disease: Secondary | ICD-10-CM | POA: Diagnosis not present

## 2016-11-13 DIAGNOSIS — G459 Transient cerebral ischemic attack, unspecified: Secondary | ICD-10-CM | POA: Diagnosis not present

## 2016-12-04 DIAGNOSIS — M6281 Muscle weakness (generalized): Secondary | ICD-10-CM | POA: Diagnosis not present

## 2016-12-04 DIAGNOSIS — R2681 Unsteadiness on feet: Secondary | ICD-10-CM | POA: Diagnosis not present

## 2016-12-04 DIAGNOSIS — R2689 Other abnormalities of gait and mobility: Secondary | ICD-10-CM | POA: Diagnosis not present

## 2016-12-04 DIAGNOSIS — G459 Transient cerebral ischemic attack, unspecified: Secondary | ICD-10-CM | POA: Diagnosis not present

## 2016-12-10 ENCOUNTER — Other Ambulatory Visit: Payer: Self-pay | Admitting: Internal Medicine

## 2016-12-11 DIAGNOSIS — R2681 Unsteadiness on feet: Secondary | ICD-10-CM | POA: Diagnosis not present

## 2016-12-11 DIAGNOSIS — R2689 Other abnormalities of gait and mobility: Secondary | ICD-10-CM | POA: Diagnosis not present

## 2016-12-11 DIAGNOSIS — G459 Transient cerebral ischemic attack, unspecified: Secondary | ICD-10-CM | POA: Diagnosis not present

## 2016-12-11 DIAGNOSIS — M6281 Muscle weakness (generalized): Secondary | ICD-10-CM | POA: Diagnosis not present

## 2016-12-18 DIAGNOSIS — M6281 Muscle weakness (generalized): Secondary | ICD-10-CM | POA: Diagnosis not present

## 2016-12-18 DIAGNOSIS — R2681 Unsteadiness on feet: Secondary | ICD-10-CM | POA: Diagnosis not present

## 2016-12-18 DIAGNOSIS — R2689 Other abnormalities of gait and mobility: Secondary | ICD-10-CM | POA: Diagnosis not present

## 2016-12-18 DIAGNOSIS — G459 Transient cerebral ischemic attack, unspecified: Secondary | ICD-10-CM | POA: Diagnosis not present

## 2017-01-01 DIAGNOSIS — M6281 Muscle weakness (generalized): Secondary | ICD-10-CM | POA: Diagnosis not present

## 2017-01-01 DIAGNOSIS — R2681 Unsteadiness on feet: Secondary | ICD-10-CM | POA: Diagnosis not present

## 2017-01-01 DIAGNOSIS — G459 Transient cerebral ischemic attack, unspecified: Secondary | ICD-10-CM | POA: Diagnosis not present

## 2017-01-01 DIAGNOSIS — R2689 Other abnormalities of gait and mobility: Secondary | ICD-10-CM | POA: Diagnosis not present

## 2017-01-06 NOTE — Progress Notes (Signed)
[name] [address] [place], [place] [address] Phone: [phone] Subjective:    I'm seeing this patient by the request  of:  Cathlean Cower, MD   [id number] shoulder pain f/u  [id number]  PRYCE FLY is a 71 y.o. male coming in with complaint of right shoulder pain. Patient was found to have more of a rotator cuff arthropathy as well as some acromioclavicular arthritis. Patient's was given a glenohumeral injection on 09/07/2016. Was to do conservative therapy otherwise. Patient does have the past medical history significant for an ACDF in the cervical spine.  Patient states since then Started and worsening symptoms again. Describes pain as a dull, throbbing aching sensation. Patient denies any numbness but does state that there is weakness. Waking him up at night. Has a history of neck pain but states that this feels different. Feels very similar to the shoulder and states that this is worsening symptoms.     Past Medical History:  Diagnosis Date  . ALLERGIC RHINITIS 11/06/2007  . Anemia, unspecified 08/22/2012  . BENIGN PROSTATIC HYPERTROPHY 07/14/2007  . COLONIC POLYPS, HX OF 07/14/2007  . Complication of anesthesia    pt states he needs to be cathed after surgery  . Degenerative arthritis of right knee 12/12/2011  . GERD 07/11/2007  . Headache(784.0) 07/11/2007  . HYPERLIPIDEMIA 07/11/2007  . HYPERTENSION 07/11/2007  . HYPERTHYROIDISM 07/14/2007  . HYPOTHYROIDISM 11/06/2007  . OSTEOARTHRITIS, KNEE, RIGHT 10/27/2008  . PERIPHERAL EDEMA 05/31/2009  . PULMONARY NODULE 06/24/2008   granuloma  . TIA (transient ischemic attack) 08/13/2015  . Type II or unspecified type diabetes mellitus without mention of complication, uncontrolled 08/22/2012   Patient states that he is not diabetic, not taking any medications  . Vitamin D deficiency 08/12/2015  . WOUND, LEG 05/31/2009   Past Surgical History:  Procedure Laterality Date  . ANTERIOR CERVICAL  DECOMP/DISCECTOMY FUSION N/A 01/06/2013   Procedure: ANTERIOR CERVICAL DECOMPRESSION/DISCECTOMY FUSION 1 LEVEL;  Surgeon: Erline Levine, MD;  Location: [facility];  Service: Neurosurgery;  Laterality: N/A;  Cervical three-four Anterior cervical decompression/diskectomy/fusion  . BACK SURGERY  1986   x 2  1986 C 3  . HERNIA REPAIR  sept 1986  . left knee replacement  Nov 21, 2007  . NECK SURGERY    . NISSEN FUNDOPLICATION  [id number]  . right heel bone spur  1987  . TOTAL KNEE ARTHROPLASTY  01/09/2012   Procedure: TOTAL KNEE ARTHROPLASTY;  Surgeon: Tobi Bastos, MD;  Location: [facility];  Service: Orthopedics;  Laterality: Right;   Social History   Social History  . Marital status: Married    Spouse name: [name]  . Number of children: 2  . Years of education: HS   Occupational History  . Retired    Social History Main Topics  . Smoking status: Former Smoker    Packs/day: 3.00    Years: 30.00    Types: Cigarettes    Quit date: 11/26/1982  . Smokeless tobacco: Never Used  . Alcohol use No  . Drug use: No  . Sexual activity: Not Asked   Other Topics Concern  . None   Social History Narrative   Patient lives at home with spouse.   Caffiene Use: 4 cups daily   Allergies  Allergen Reactions  . Adhesive [Tape] Other (See Comments)    Rash, reddness   Paper Tape: As far as pt knows, okay.  . Codeine Other (See Comments)    Headaches   .  Naproxen Diarrhea  . Prednisone Diarrhea  . Zolpidem Tartrate Other (See Comments)    hallucinations  . Penicillins Rash    whelps   Family History  Problem Relation Age of Onset  . Heart attack Mother   . Heart attack Father   . Emphysema Father   . Heart disease Other   . Diabetes Neg Hx   . Thyroid disease Neg Hx     Past medical history, social, surgical and family history all reviewed in electronic medical record.  No pertanent information unless stated regarding to the chief complaint.   Review of Systems: No  visual changes,  nausea, vomiting, diarrhea, constipation, dizziness, abdominal pain, skin rash, fevers, chills, night sweats, weight loss, swollen lymph nodes,  chest pain, shortness of breath, mood changes.  Positive for headaches, body aches, joint swelling and muscle aches.  Objective  Blood pressure (!) 160/84, pulse (!) 56, height 6' (1.829 m), weight 200 lb (90.7 kg).  Systems examined below as of 01/07/17 General: NAD A&O x3 mood, affect normal  HEENT: Pupils equal, extraocular movements intact no nystagmus Respiratory: not short of breath at rest or with speaking Cardiovascular: No lower extremity edema, non tender Skin: Warm dry intact with no signs of infection or rash on extremities or on axial skeleton. Abdomen: Soft nontender, no masses Neuro: Cranial nerves  intact, neurovascularly intact in all extremities with 2+ DTRs and 2+ pulses. Lymph: No lymphadenopathy appreciated today  Gait antalgic walking with the aid of a cane MSK: Non tender with full range of motion and good stability and symmetric strength and tone of , elbows, wrist,  knee hips and ankles bilaterally.   Arthritic changes of multiple joints Shoulder: Right Atrophy noted. 4 out of 5 strength compared to the contralateral side Any signs of impingement with positive empty can crepitus noted Speeds and Yergason's tests normal. Positive labral pathology Normal scapular function observed. No apprehension sign Contralateral shoulder has some mild crepitus with range of motion but 5 out of 5 strength  Procedure: Real-time Ultrasound Guided Injection of right glenohumeral joint Device: GE Logiq Q7  Ultrasound guided injection is preferred based studies that show increased duration, increased effect, greater accuracy, decreased procedural pain, increased response rate with ultrasound guided versus blind injection.  Verbal informed consent obtained.  Time-out conducted.  Noted no overlying erythema, induration, or other signs of  local infection.  Skin prepped in a sterile fashion.  Local anesthesia: Topical Ethyl chloride.  With sterile technique and under real time ultrasound guidance:  Joint visualized.  23g 1  inch needle inserted posterior approach. Pictures taken for needle placement. Patient did have injection of 2 cc of 1% lidocaine, 2 cc of 0.5% Marcaine, and 1.0 cc of Kenalog 40 mg/dL. Completed without difficulty  Pain immediately resolved suggesting accurate placement of the medication.  Advised to call if fevers/chills, erythema, induration, drainage, or persistent bleeding.  Images permanently stored and available for review in the ultrasound unit.  Impression: Technically successful ultrasound guided injection.  Impression and Recommendations:     This case required medical decision making of moderate complexity.      Note: This dictation was prepared with Dragon dictation along with smaller phrase technology. Any transcriptional errors that result from this process are unintentional.

## 2017-01-07 ENCOUNTER — Ambulatory Visit: Payer: Self-pay

## 2017-01-07 ENCOUNTER — Encounter: Payer: Self-pay | Admitting: Family Medicine

## 2017-01-07 ENCOUNTER — Ambulatory Visit (INDEPENDENT_AMBULATORY_CARE_PROVIDER_SITE_OTHER): Payer: Medicare Other | Admitting: Family Medicine

## 2017-01-07 VITALS — BP 160/84 | HR 56 | Ht 72.0 in | Wt 200.0 lb

## 2017-01-07 DIAGNOSIS — M12811 Other specific arthropathies, not elsewhere classified, right shoulder: Secondary | ICD-10-CM | POA: Diagnosis not present

## 2017-01-07 DIAGNOSIS — M542 Cervicalgia: Secondary | ICD-10-CM | POA: Diagnosis not present

## 2017-01-07 DIAGNOSIS — M4802 Spinal stenosis, cervical region: Secondary | ICD-10-CM

## 2017-01-07 DIAGNOSIS — M75101 Unspecified rotator cuff tear or rupture of right shoulder, not specified as traumatic: Secondary | ICD-10-CM

## 2017-01-07 MED ORDER — GABAPENTIN 100 MG PO CAPS: 200.0000 mg | ORAL_CAPSULE | Freq: Every day | ORAL | 3 refills | Status: DC

## 2017-01-07 NOTE — Assessment & Plan Note (Signed)
Repeat injection given today. Tolerated the procedure well. We discussed that there could be some cervical over detail operative. Started on gabapentin low dose. We discussed icing regimen. Discussed home exercise. We discussed which activities doing which ones to avoid. We discussed we can repeat injection every 10 weeks. Discussed with family who was at bedside today and we discussed that medical decision making. Patient will come back and see me again in 10 weeks for further evaluation and treatment.

## 2017-01-07 NOTE — Patient Instructions (Addendum)
Good to see you  Alvera Singh is your friend.  We injected the shoulder again.  pennsaid pinkie amount topically 2 times daily as needed.   Continue the gabapentin  See me again in 10 weeks and we can repeat injections as needed or call sooner if worsening.

## 2017-01-07 NOTE — Assessment & Plan Note (Signed)
Patient will be on gabapentin. Discussed the potential side effects. I think it could be beneficial if any of this is radicular symptoms. Worsening symptoms overall. Follow-up again in 4 weeks.

## 2017-02-07 ENCOUNTER — Other Ambulatory Visit (INDEPENDENT_AMBULATORY_CARE_PROVIDER_SITE_OTHER): Payer: Medicare Other

## 2017-02-07 DIAGNOSIS — Z0001 Encounter for general adult medical examination with abnormal findings: Secondary | ICD-10-CM | POA: Diagnosis not present

## 2017-02-07 DIAGNOSIS — R7989 Other specified abnormal findings of blood chemistry: Secondary | ICD-10-CM | POA: Diagnosis not present

## 2017-02-07 DIAGNOSIS — E119 Type 2 diabetes mellitus without complications: Secondary | ICD-10-CM | POA: Diagnosis not present

## 2017-02-07 LAB — HEPATIC FUNCTION PANEL
ALBUMIN: 4.4 g/dL (ref 3.5–5.2)
ALT: 21 U/L (ref 0–53)
AST: 17 U/L (ref 0–37)
Alkaline Phosphatase: 50 U/L (ref 39–117)
BILIRUBIN TOTAL: 0.6 mg/dL (ref 0.2–1.2)
Bilirubin, Direct: 0.1 mg/dL (ref 0.0–0.3)
Total Protein: 6.8 g/dL (ref 6.0–8.3)

## 2017-02-07 LAB — MICROALBUMIN / CREATININE URINE RATIO
Creatinine,U: 147.1 mg/dL
MICROALB UR: 3 mg/dL — AB (ref 0.0–1.9)
Microalb Creat Ratio: 2 mg/g (ref 0.0–30.0)

## 2017-02-07 LAB — URINALYSIS, ROUTINE W REFLEX MICROSCOPIC
BILIRUBIN URINE: NEGATIVE
Hgb urine dipstick: NEGATIVE
KETONES UR: NEGATIVE
NITRITE: NEGATIVE
RBC / HPF: NONE SEEN (ref 0–?)
SPECIFIC GRAVITY, URINE: 1.01 (ref 1.000–1.030)
Total Protein, Urine: NEGATIVE
URINE GLUCOSE: NEGATIVE
Urobilinogen, UA: 1 (ref 0.0–1.0)
pH: 6.5 (ref 5.0–8.0)

## 2017-02-07 LAB — BASIC METABOLIC PANEL
BUN: 15 mg/dL (ref 6–23)
CALCIUM: 10.3 mg/dL (ref 8.4–10.5)
CO2: 29 mEq/L (ref 19–32)
Chloride: 103 mEq/L (ref 96–112)
Creatinine, Ser: 1.06 mg/dL (ref 0.40–1.50)
GFR: 72.8 mL/min (ref >60.00)
Glucose, Bld: 212 mg/dL — ABNORMAL HIGH (ref 70–99)
POTASSIUM: 4.2 meq/L (ref 3.5–5.1)
Sodium: 142 mEq/L (ref 135–145)

## 2017-02-07 LAB — CBC WITH DIFFERENTIAL/PLATELET
BASOS PCT: 1 % (ref 0.0–3.0)
Basophils Absolute: 0.1 10*3/uL (ref 0.0–0.1)
EOS PCT: 2.4 % (ref 0.0–5.0)
Eosinophils Absolute: 0.2 10*3/uL (ref 0.0–0.7)
HEMATOCRIT: 43.3 % (ref 39.0–52.0)
HEMOGLOBIN: 14.7 g/dL (ref 13.0–17.0)
LYMPHS PCT: 19.5 % (ref 12.0–46.0)
Lymphs Abs: 1.7 10*3/uL (ref 0.7–4.0)
MCHC: 33.9 g/dL (ref 30.0–36.0)
MCV: 90.4 fl (ref 78.0–100.0)
MONO ABS: 0.7 10*3/uL (ref 0.1–1.0)
Monocytes Relative: 8.1 % (ref 3.0–12.0)
Neutro Abs: 5.9 10*3/uL (ref 1.4–7.7)
Neutrophils Relative %: 69 % (ref 43.0–77.0)
Platelets: 271 10*3/uL (ref 150.0–400.0)
RBC: 4.79 Mil/uL (ref 4.22–5.81)
RDW: 13.2 % (ref 11.5–15.5)
WBC: 8.5 10*3/uL (ref 4.0–10.5)

## 2017-02-07 LAB — LDL CHOLESTEROL, DIRECT: LDL DIRECT: 90 mg/dL

## 2017-02-07 LAB — LIPID PANEL
CHOLESTEROL: 150 mg/dL (ref 0–200)
HDL: 39.6 mg/dL (ref >39.00)
NonHDL: 110.88
Total CHOL/HDL Ratio: 4
Triglycerides: 211 mg/dL — ABNORMAL HIGH (ref 0.0–149.0)
VLDL: 42.2 mg/dL — ABNORMAL HIGH (ref 0.0–40.0)

## 2017-02-07 LAB — TSH: TSH: 2.16 u[IU]/mL (ref 0.35–4.50)

## 2017-02-07 LAB — PSA: PSA: 1.6 ng/mL (ref 0.10–4.00)

## 2017-02-07 LAB — HEMOGLOBIN A1C: Hgb A1c MFr Bld: 7.9 % — ABNORMAL HIGH (ref 4.6–6.5)

## 2017-02-19 ENCOUNTER — Encounter: Payer: Self-pay | Admitting: Internal Medicine

## 2017-02-19 ENCOUNTER — Ambulatory Visit (INDEPENDENT_AMBULATORY_CARE_PROVIDER_SITE_OTHER): Payer: Medicare Other | Admitting: Internal Medicine

## 2017-02-19 VITALS — BP 128/68 | HR 67 | Temp 98.3°F | Wt 201.0 lb

## 2017-02-19 DIAGNOSIS — L989 Disorder of the skin and subcutaneous tissue, unspecified: Secondary | ICD-10-CM | POA: Insufficient documentation

## 2017-02-19 DIAGNOSIS — E119 Type 2 diabetes mellitus without complications: Secondary | ICD-10-CM

## 2017-02-19 DIAGNOSIS — Z0000 Encounter for general adult medical examination without abnormal findings: Secondary | ICD-10-CM

## 2017-02-19 HISTORY — DX: Disorder of the skin and subcutaneous tissue, unspecified: L98.9

## 2017-02-19 MED ORDER — GLUCOSE BLOOD VI STRP: ORAL_STRIP | 3 refills | Status: DC

## 2017-02-19 MED ORDER — HYDROCHLOROTHIAZIDE 25 MG PO TABS: 25.0000 mg | ORAL_TABLET | Freq: Every day | ORAL | 3 refills | Status: DC

## 2017-02-19 MED ORDER — METFORMIN HCL ER 500 MG PO TB24: 1000.0000 mg | ORAL_TABLET | Freq: Every day | ORAL | 3 refills | Status: DC

## 2017-02-19 MED ORDER — ATORVASTATIN CALCIUM 40 MG PO TABS: 40.0000 mg | ORAL_TABLET | Freq: Every day | ORAL | 3 refills | Status: DC

## 2017-02-19 MED ORDER — AMLODIPINE BESYLATE 5 MG PO TABS: 5.0000 mg | ORAL_TABLET | Freq: Every day | ORAL | 3 refills | Status: DC

## 2017-02-19 MED ORDER — LEVOTHYROXINE SODIUM 125 MCG PO TABS: 125.0000 ug | ORAL_TABLET | Freq: Every day | ORAL | 3 refills | Status: DC

## 2017-02-19 MED ORDER — GABAPENTIN 100 MG PO CAPS: 200.0000 mg | ORAL_CAPSULE | Freq: Every day | ORAL | 1 refills | Status: DC

## 2017-02-19 MED ORDER — LABETALOL HCL 300 MG PO TABS: 300.0000 mg | ORAL_TABLET | Freq: Two times a day (BID) | ORAL | 3 refills | Status: DC

## 2017-02-19 MED ORDER — CETIRIZINE HCL 10 MG PO TABS: 10.0000 mg | ORAL_TABLET | Freq: Every day | ORAL | 3 refills | Status: DC

## 2017-02-19 MED ORDER — CLOPIDOGREL BISULFATE 75 MG PO TABS: 75.0000 mg | ORAL_TABLET | Freq: Every day | ORAL | 3 refills | Status: DC

## 2017-02-19 MED ORDER — FENOFIBRATE 160 MG PO TABS: 160.0000 mg | ORAL_TABLET | Freq: Every day | ORAL | 3 refills | Status: DC

## 2017-02-19 MED ORDER — ACCU-CHEK SOFTCLIX LANCETS MISC: 11 refills | Status: DC

## 2017-02-19 NOTE — Patient Instructions (Signed)
OK to increase the metformin to 2 pills in the AM  Please continue all other medications as before, and refills have been done if requested.  Please have the pharmacy call with any other refills you may need.  Please continue your efforts at being more active, low cholesterol diet, and weight control.  You are otherwise up to date with prevention measures today.  Please keep your appointments with your specialists as you may have planned  You will be contacted regarding the referral for: Dermatology  Please return in 6 months, or sooner if needed, with Lab testing done 3-5 days before

## 2017-02-19 NOTE — Progress Notes (Signed)
Pre visit review using our clinic review tool, if applicable. No additional management support is needed unless otherwise documented below in the visit note. 

## 2017-02-19 NOTE — Progress Notes (Signed)
Subjective:    Patient ID: [name], male    DOB: [birth year], 71 y.o.   MRN: [id number]  HPI  Here for wellness and f/u;  Overall doing ok;  Pt denies Chest pain, worsening SOB, DOE, wheezing, orthopnea, PND, worsening LE edema, palpitations, dizziness or syncope.  Pt denies neurological change such as new headache, facial or extremity weakness.  Pt denies polydipsia, polyuria, or low sugar symptoms. Pt states overall good compliance with treatment and medications, good tolerability, and has been trying to follow appropriate diet.  Pt denies worsening depressive symptoms, suicidal ideation or panic. No fever, night sweats, wt loss, loss of appetite, or other constitutional symptoms.  Pt states good ability with ADL's, has low fall risk, home safety reviewed and adequate, no other significant changes in hearing or vision, and only occasionally active with exercise. Has now continual cane use due to unsteady gait and several falls with no injury. Has asa/plavix but not on anticoagulant  Has new left skin lesion tan raised getting irritated to mid post left arm.  .No other new hx Past Medical History:  Diagnosis Date  . ALLERGIC RHINITIS 11/06/2007  . Anemia, unspecified 08/22/2012  . BENIGN PROSTATIC HYPERTROPHY 07/14/2007  . COLONIC POLYPS, HX OF 07/14/2007  . Complication of anesthesia    pt states he needs to be cathed after surgery  . Degenerative arthritis of right knee 12/12/2011  . GERD 07/11/2007  . Headache(784.0) 07/11/2007  . HYPERLIPIDEMIA 07/11/2007  . HYPERTENSION 07/11/2007  . HYPERTHYROIDISM 07/14/2007  . HYPOTHYROIDISM 11/06/2007  . OSTEOARTHRITIS, KNEE, RIGHT 10/27/2008  . PERIPHERAL EDEMA 05/31/2009  . PULMONARY NODULE 06/24/2008   granuloma  . TIA (transient ischemic attack) 08/13/2015  . Type II or unspecified type diabetes mellitus without mention of complication, uncontrolled 08/22/2012   Patient states that he is not diabetic, not taking any medications  . Vitamin D  deficiency 08/12/2015  . WOUND, LEG 05/31/2009   Past Surgical History:  Procedure Laterality Date  . ANTERIOR CERVICAL DECOMP/DISCECTOMY FUSION N/A 01/06/2013   Procedure: ANTERIOR CERVICAL DECOMPRESSION/DISCECTOMY FUSION 1 LEVEL;  Surgeon: Erline Levine, MD;  Location: [facility];  Service: Neurosurgery;  Laterality: N/A;  Cervical three-four Anterior cervical decompression/diskectomy/fusion  . BACK SURGERY  1986   x 2  1986 C 3  . HERNIA REPAIR  sept 1986  . left knee replacement  Nov 21, 2007  . NECK SURGERY    . NISSEN FUNDOPLICATION  4481  . right heel bone spur  1987  . TOTAL KNEE ARTHROPLASTY  01/09/2012   Procedure: TOTAL KNEE ARTHROPLASTY;  Surgeon: Tobi Bastos, MD;  Location: [facility];  Service: Orthopedics;  Laterality: Right;    reports that he quit smoking about 34 years ago. His smoking use included Cigarettes. He has a 90.00 pack-year smoking history. He has never used smokeless tobacco. He reports that he does not drink alcohol or use drugs. family history includes Emphysema in his father; Heart attack in his father and mother; Heart disease in his other. Allergies  Allergen Reactions  . Adhesive [Tape] Other (See Comments)    Rash, reddness   Paper Tape: As far as pt knows, okay.  . Codeine Other (See Comments)    Headaches   . Naproxen Diarrhea  . Prednisone Diarrhea  . Zolpidem Tartrate Other (See Comments)    hallucinations  . Penicillins Rash    whelps   Current Outpatient Prescriptions on File Prior to Visit  Medication Sig Dispense Refill  . Ascorbic  Acid (VITAMIN C) 1000 MG tablet Take 1,000 mg by mouth daily.    . Blood Glucose Monitoring Suppl (ACCU-CHEK AVIVA PLUS) w/Device KIT Use as directed 1 kit 0  . Coenzyme Q10 (CO Q-10 MAXIMUM STRENGTH) 400 MG CAPS Take 1 capsule by mouth daily.    Marland Kitchen glucose blood (ONE TOUCH ULTRA TEST) test strip Use as instructed 100 each 12  . glucose blood test strip 1 each by Other route 2 (two) times daily. Use to check  blood sugars twice a day Dx E11.9 300 each 2  . Omega-3 Fatty Acids (FISH OIL) 1000 MG CAPS Take 2 capsules by mouth daily.     No current facility-administered medications on file prior to visit.    Review of Systems Constitutional: Negative for other unusual diaphoresis, sweats, appetite or weight changes HENT: Negative for other worsening hearing loss, ear pain, facial swelling, mouth sores or neck stiffness.   Eyes: Negative for other worsening pain, redness or other visual disturbance.  Respiratory: Negative for other stridor or swelling Cardiovascular: Negative for other palpitations or other chest pain  Gastrointestinal: Negative for worsening diarrhea or loose stools, blood in stool, distention or other pain Genitourinary: Negative for hematuria, flank pain or other change in urine volume.  Musculoskeletal: Negative for myalgias or other joint swelling.  Skin: Negative for other color change, or other wound or worsening drainage.  Neurological: Negative for other syncope or numbness. Hematological: Negative for other adenopathy or swelling Psychiatric/Behavioral: Negative for hallucinations, other worsening agitation, SI, self-injury, or new decreased concentration All other system neg per pt    Objective:   Physical Exam BP 128/68   Pulse 67   Temp 98.3 F (36.8 C) (Oral)   Wt 201 lb (91.2 kg)   SpO2 98%   BMI 27.26 kg/m  Walks with cane for balance VS noted,  Constitutional: Pt is oriented to person, place, and time. Appears well-developed and well-nourished, in no significant distress and comfortable Head: Normocephalic and atraumatic  Eyes: Conjunctivae and EOM are normal. Pupils are equal, round, and reactive to light Right Ear: External ear normal without discharge Left Ear: External ear normal without discharge Nose: Nose without discharge or deformity Mouth/Throat: Oropharynx is without other ulcerations and moist  Neck: Normal range of motion. Neck supple. No  JVD present. No tracheal deviation present or significant neck LA or mass Cardiovascular: Normal rate, regular rhythm, normal heart sounds and intact distal pulses.   Pulmonary/Chest: WOB normal and breath sounds without rales or wheezing  Abdominal: Soft. Bowel sounds are normal. NT. No HSM  Musculoskeletal: Normal range of motion. Exhibits no edema Lymphadenopathy: Has no other cervical adenopathy.  Neurological: Pt is alert and oriented to person, place, and time. Pt has normal reflexes. No cranial nerve deficit. Motor grossly intact, Gait intact Skin: Skin is warm and dry. No rash noted or new ulcerations, left post arm with < 1/2 cm slight raised tan lesion, NT without irriation Psychiatric:  Has normal mood and affect. Behavior is normal without agitation No other exam findings  Lab Results  Component Value Date   WBC 8.5 02/07/2017   HGB 14.7 02/07/2017   HCT 43.3 02/07/2017   PLT 271.0 02/07/2017   GLUCOSE 212 (H) 02/07/2017   CHOL 150 02/07/2017   TRIG 211.0 (H) 02/07/2017   HDL 39.60 02/07/2017   LDLDIRECT 90.0 02/07/2017   LDLCALC 63 08/14/2016   ALT 21 02/07/2017   AST 17 02/07/2017   NA 142 02/07/2017   K 4.2  02/07/2017   CL 103 02/07/2017   CREATININE 1.06 02/07/2017   BUN 15 02/07/2017   CO2 29 02/07/2017   TSH 2.16 02/07/2017   PSA 1.60 02/07/2017   INR 0.97 01/01/2012   HGBA1C 7.9 (H) 02/07/2017   MICROALBUR 3.0 (H) 02/07/2017       Assessment & Plan:

## 2017-02-23 NOTE — Assessment & Plan Note (Signed)
For derm referral, r/o skin ca

## 2017-02-23 NOTE — Assessment & Plan Note (Signed)

## 2017-02-23 NOTE — Assessment & Plan Note (Signed)
Mild uncontrolled, to increase metformin ER 500 - 2 per day

## 2017-02-28 DIAGNOSIS — L821 Other seborrheic keratosis: Secondary | ICD-10-CM | POA: Diagnosis not present

## 2017-02-28 DIAGNOSIS — C44629 Squamous cell carcinoma of skin of left upper limb, including shoulder: Secondary | ICD-10-CM | POA: Diagnosis not present

## 2017-02-28 DIAGNOSIS — L578 Other skin changes due to chronic exposure to nonionizing radiation: Secondary | ICD-10-CM | POA: Diagnosis not present

## 2017-03-15 ENCOUNTER — Ambulatory Visit (INDEPENDENT_AMBULATORY_CARE_PROVIDER_SITE_OTHER): Payer: Medicare Other | Admitting: Family Medicine

## 2017-03-15 ENCOUNTER — Encounter: Payer: Self-pay | Admitting: Family Medicine

## 2017-03-15 ENCOUNTER — Ambulatory Visit: Payer: Self-pay

## 2017-03-15 VITALS — BP 140/80 | HR 61 | Resp 16 | Wt 200.1 lb

## 2017-03-15 DIAGNOSIS — M25511 Pain in right shoulder: Secondary | ICD-10-CM

## 2017-03-15 DIAGNOSIS — M12811 Other specific arthropathies, not elsewhere classified, right shoulder: Secondary | ICD-10-CM

## 2017-03-15 DIAGNOSIS — M75101 Unspecified rotator cuff tear or rupture of right shoulder, not specified as traumatic: Secondary | ICD-10-CM

## 2017-03-15 NOTE — Patient Instructions (Signed)
Good to see you.  Ice 20 minutes 2 times daily. Usually after activity and before bed. Keep hands within peripheral vision.  If not a lot better can do the other joint we talked about  Otherwise see you again in 10 weeks

## 2017-03-15 NOTE — Progress Notes (Signed)
Pre-visit discussion using our clinic review tool. No additional management support is needed unless otherwise documented below in the visit note.,h

## 2017-03-15 NOTE — Assessment & Plan Note (Signed)
Patient was having worsening symptoms. Because the patient's other comorbidities patient intra-articular injections are likely the less risky. Discussed with patient about doing the exercises. Patient declined formal physical therapy again which she has done in the past. Patient has no acromioclavicular arthritis. Can notice consider injection of that joint as well. Discussed with patient at great length. Symptoms come back sooner otherwise we can repeat injection every 10 weeks.  Spent  25 minutes with patient face-to-face and had greater than 50% of counseling including as described above in assessment and plan.

## 2017-03-15 NOTE — Progress Notes (Signed)
[name] [address], [place] [address] Phone: [phone] Subjective:    I'm seeing this patient by the request  of:  Cathlean Cower, MD   SL:[id number] shoulder pain f/u  [id number]  [name] is a 71 y.o. male coming in with complaint of right shoulder pain. Patient was found to have more of a rotator cuff arthropathy as well as some acromioclavicular arthritis. Patient's was given a glenohumeral injection on 01/07/2017. Was to do conservative therapy otherwise. Patient does have the past medical history significant for an ACDF in the cervical spine.  Patient was doing well for a month and then the pain started coming back. Now severe enough that is waking him up at night, affecting daily activities, some mild radiation down the arm. Patient states that the severity of pain is affecting quality of life.     Past Medical History:  Diagnosis Date  . ALLERGIC RHINITIS 11/06/2007  . Anemia, unspecified 08/22/2012  . BENIGN PROSTATIC HYPERTROPHY 07/14/2007  . COLONIC POLYPS, HX OF 07/14/2007  . Complication of anesthesia    pt states he needs to be cathed after surgery  . Degenerative arthritis of right knee 12/12/2011  . GERD 07/11/2007  . Headache(784.0) 07/11/2007  . HYPERLIPIDEMIA 07/11/2007  . HYPERTENSION 07/11/2007  . HYPERTHYROIDISM 07/14/2007  . HYPOTHYROIDISM 11/06/2007  . OSTEOARTHRITIS, KNEE, RIGHT 10/27/2008  . PERIPHERAL EDEMA 05/31/2009  . PULMONARY NODULE 06/24/2008   granuloma  . TIA (transient ischemic attack) 08/13/2015  . Type II or unspecified type diabetes mellitus without mention of complication, uncontrolled 08/22/2012   Patient states that he is not diabetic, not taking any medications  . Vitamin D deficiency 08/12/2015  . WOUND, LEG 05/31/2009   Past Surgical History:  Procedure Laterality Date  . ANTERIOR CERVICAL DECOMP/DISCECTOMY FUSION N/A 01/06/2013   Procedure: ANTERIOR CERVICAL DECOMPRESSION/DISCECTOMY FUSION 1 LEVEL;   Surgeon: Erline Levine, MD;  Location: [facility];  Service: Neurosurgery;  Laterality: N/A;  Cervical three-four Anterior cervical decompression/diskectomy/fusion  . BACK SURGERY  1986   x 2  1986 C 3  . HERNIA REPAIR  sept 1986  . left knee replacement  Nov 21, 2007  . NECK SURGERY    . NISSEN FUNDOPLICATION  5597  . right heel bone spur  1987  . TOTAL KNEE ARTHROPLASTY  01/09/2012   Procedure: TOTAL KNEE ARTHROPLASTY;  Surgeon: Tobi Bastos, MD;  Location: [facility];  Service: Orthopedics;  Laterality: Right;   Social History   Social History  . Marital status: Married    Spouse name: [name]  . Number of children: 2  . Years of education: HS   Occupational History  . Retired    Social History Main Topics  . Smoking status: Former Smoker    Packs/day: 3.00    Years: 30.00    Types: Cigarettes    Quit date: 11/26/1982  . Smokeless tobacco: Never Used  . Alcohol use No  . Drug use: No  . Sexual activity: Not Asked   Other Topics Concern  . None   Social History Narrative   Patient lives at home with spouse.   Caffiene Use: 4 cups daily   Allergies  Allergen Reactions  . Adhesive [Tape] Other (See Comments)    Rash, reddness   Paper Tape: As far as pt knows, okay.  . Codeine Other (See Comments)    Headaches   . Naproxen Diarrhea  . Prednisone Diarrhea  . Zolpidem Tartrate Other (See  Comments)    hallucinations  . Penicillins Rash    whelps   Family History  Problem Relation Age of Onset  . Heart attack Mother   . Heart attack Father   . Emphysema Father   . Heart disease Other   . Diabetes Neg Hx   . Thyroid disease Neg Hx     Past medical history, social, surgical and family history all reviewed in electronic medical record.  No pertanent information unless stated regarding to the chief complaint.   Review of Systems: No headache, visual changes, nausea, vomiting, diarrhea, constipation, dizziness, abdominal pain, skin rash, fevers, chills, night  sweats, weight loss, swollen lymph nodes, chest pain, shortness of breath, mood changes.  Positive for body aches and muscle aches.  Objective  Blood pressure 140/80, pulse 61, resp. rate 16, weight 200 lb 2 oz (90.8 kg), SpO2 97 %.  Systems examined below as of 03/15/17 General: NAD A&O x3 mood, affect normal  HEENT: Pupils equal, extraocular movements intact no nystagmus Respiratory: not short of breath at rest or with speaking Cardiovascular: No lower extremity edema, non tender Skin: Warm dry intact with no signs of infection or rash on extremities or on axial skeleton. Abdomen: Soft nontender, no masses Neuro: Cranial nerves  intact, neurovascularly intact in all extremities with 2+ DTRs and 2+ pulses. Lymph: No lymphadenopathy appreciated today  Gait antalgic walking with the aid of a cane MSK: Non tender with full range of motion and good stability and symmetric strength and tone of , elbows, wrist,  knee hips and ankles bilaterally.   Arthritic changes of multiple joints Shoulder: Right Atrophy noted. 3+ out of 5 strength compared to the contralateral side Worsening signs of impingement and positive empty can with significant crepitus. Positive labral pathology Weakness of the scapular muscles noted. No apprehension sign Contralateral shoulder has some mild crepitus with range of motion but 5 out of 5 strength for atrophy of this musculature as well.  Procedure: Real-time Ultrasound Guided Injection of right glenohumeral joint Device: GE Logiq Q7  Ultrasound guided injection is preferred based studies that show increased duration, increased effect, greater accuracy, decreased procedural pain, increased response rate with ultrasound guided versus blind injection.  Verbal informed consent obtained.  Time-out conducted.  Noted no overlying erythema, induration, or other signs of local infection.  Skin prepped in a sterile fashion.  Local anesthesia: Topical Ethyl chloride.  With  sterile technique and under real time ultrasound guidance:  Joint visualized.  23g 1  inch needle inserted posterior approach. Pictures taken for needle placement. Patient did have injection of 2 cc of 1% lidocaine, 2 cc of 0.5% Marcaine, and 1.0 cc of Kenalog 40 mg/dL. Completed without difficulty  Pain immediately resolved suggesting accurate placement of the medication.  Advised to call if fevers/chills, erythema, induration, drainage, or persistent bleeding.  Images permanently stored and available for review in the ultrasound unit.  Impression: Technically successful ultrasound guided injection.  Impression and Recommendations:     This case required medical decision making of moderate complexity.      Note: This dictation was prepared with Dragon dictation along with smaller phrase technology. Any transcriptional errors that result from this process are unintentional.

## 2017-03-21 DIAGNOSIS — C44629 Squamous cell carcinoma of skin of left upper limb, including shoulder: Secondary | ICD-10-CM | POA: Diagnosis not present

## 2017-05-22 ENCOUNTER — Encounter: Payer: Self-pay | Admitting: Family Medicine

## 2017-05-22 ENCOUNTER — Ambulatory Visit (INDEPENDENT_AMBULATORY_CARE_PROVIDER_SITE_OTHER): Payer: Medicare Other | Admitting: Family Medicine

## 2017-05-22 ENCOUNTER — Ambulatory Visit: Payer: Self-pay

## 2017-05-22 ENCOUNTER — Ambulatory Visit (INDEPENDENT_AMBULATORY_CARE_PROVIDER_SITE_OTHER)
Admission: RE | Admit: 2017-05-22 | Discharge: 2017-05-22 | Disposition: A | Discharge status: Home / Self Care | Payer: Medicare Other | Source: Ambulatory Visit | Attending: Family Medicine | Admitting: Family Medicine

## 2017-05-22 ENCOUNTER — Other Ambulatory Visit: Payer: Self-pay | Admitting: Family Medicine

## 2017-05-22 VITALS — BP 150/90 | HR 75 | Ht 72.0 in | Wt 196.4 lb

## 2017-05-22 DIAGNOSIS — M542 Cervicalgia: Secondary | ICD-10-CM

## 2017-05-22 DIAGNOSIS — G8929 Other chronic pain: Secondary | ICD-10-CM

## 2017-05-22 DIAGNOSIS — M25511 Pain in right shoulder: Principal | ICD-10-CM

## 2017-05-22 DIAGNOSIS — M75101 Unspecified rotator cuff tear or rupture of right shoulder, not specified as traumatic: Secondary | ICD-10-CM

## 2017-05-22 DIAGNOSIS — M4802 Spinal stenosis, cervical region: Secondary | ICD-10-CM

## 2017-05-22 DIAGNOSIS — M12811 Other specific arthropathies, not elsewhere classified, right shoulder: Secondary | ICD-10-CM | POA: Diagnosis not present

## 2017-05-22 MED ORDER — TRIAMCINOLONE ACETONIDE 40 MG/ML IJ SUSP
40.0000 mg | Freq: Once | INTRAMUSCULAR | Status: AC
  Administered 2017-05-22: 40 mg via INTRAMUSCULAR

## 2017-05-22 NOTE — Assessment & Plan Note (Signed)
I believe the patient is having worsening radicular symptoms. X-rays ordered today in with the weakness likely will need an MRI. Patient may need other surgical intervention or possible epidural injections. Patient would be a candidate for either. We discussed icing regimen. Patient will come back and see me again after imaging to discuss intervention. Knows if worsening weakness to seek medical attention immediately.

## 2017-05-22 NOTE — Progress Notes (Signed)
[name] [place], [place] [address] Phone: [phone] Subjective:    I'm seeing this patient by the request  of:  Biagio Borg, MD   JI:RCVEL shoulder pain f/u  [id number]  [name] is a 71 y.o. male coming in with complaint of right shoulder pain. Patient was found to have more of a rotator cuff arthropathy as well as some acromioclavicular arthritis. Patient's was given a glenohumeral injection on 01/07/2017. Was to do conservative therapy otherwise. Patient does have the past medical history significant for an ACDF in the cervical spine.  Patient is asked having worsening pain. Worsening radicular symptoms going down the right hand. States it is noticing increasing weakness as well.  Patient states that the right shoulder pain is also severe again. States that it is difficult to do daily activities as well as waking him up at night. Rates the severity of pain is 8 out of 10. Patient states that right arm seems to be almost nonfunctional from time to time.       Past Medical History:  Diagnosis Date  . ALLERGIC RHINITIS 11/06/2007  . Anemia, unspecified 08/22/2012  . BENIGN PROSTATIC HYPERTROPHY 07/14/2007  . COLONIC POLYPS, HX OF 07/14/2007  . Complication of anesthesia    pt states he needs to be cathed after surgery  . Degenerative arthritis of right knee 12/12/2011  . GERD 07/11/2007  . Headache(784.0) 07/11/2007  . HYPERLIPIDEMIA 07/11/2007  . HYPERTENSION 07/11/2007  . HYPERTHYROIDISM 07/14/2007  . HYPOTHYROIDISM 11/06/2007  . OSTEOARTHRITIS, KNEE, RIGHT 10/27/2008  . PERIPHERAL EDEMA 05/31/2009  . PULMONARY NODULE 06/24/2008   granuloma  . TIA (transient ischemic attack) 08/13/2015  . Type II or unspecified type diabetes mellitus without mention of complication, uncontrolled 08/22/2012   Patient states that he is not diabetic, not taking any medications  . Vitamin D deficiency 08/12/2015  . WOUND, LEG 05/31/2009   Past  Surgical History:  Procedure Laterality Date  . ANTERIOR CERVICAL DECOMP/DISCECTOMY FUSION N/A 01/06/2013   Procedure: ANTERIOR CERVICAL DECOMPRESSION/DISCECTOMY FUSION 1 LEVEL;  Surgeon: Erline Levine, MD;  Location: [facility];  Service: Neurosurgery;  Laterality: N/A;  Cervical three-four Anterior cervical decompression/diskectomy/fusion  . BACK SURGERY  1986   x 2  1986 C 3  . HERNIA REPAIR  sept 1986  . left knee replacement  Nov 21, 2007  . NECK SURGERY    . NISSEN FUNDOPLICATION  2778  . right heel bone spur  1987  . TOTAL KNEE ARTHROPLASTY  01/09/2012   Procedure: TOTAL KNEE ARTHROPLASTY;  Surgeon: Tobi Bastos, MD;  Location: [facility];  Service: Orthopedics;  Laterality: Right;   Social History   Social History  . Marital status: Married    Spouse name: [name]  . Number of children: 2  . Years of education: HS   Occupational History  . Retired    Social History Main Topics  . Smoking status: Former Smoker    Packs/day: 3.00    Years: 30.00    Types: Cigarettes    Quit date: 11/26/1982  . Smokeless tobacco: Never Used  . Alcohol use No  . Drug use: No  . Sexual activity: Not Asked   Other Topics Concern  . None   Social History Narrative   Patient lives at home with spouse.   Caffiene Use: 4 cups daily   Allergies  Allergen Reactions  . Adhesive [Tape] Other (See Comments)    Rash, reddness  Paper Tape: As far as pt knows, okay.  . Codeine Other (See Comments)    Headaches   . Naproxen Diarrhea  . Prednisone Diarrhea  . Zolpidem Tartrate Other (See Comments)    hallucinations  . Penicillins Rash    whelps   Family History  Problem Relation Age of Onset  . Heart attack Mother   . Heart attack Father   . Emphysema Father   . Heart disease Other   . Diabetes Neg Hx   . Thyroid disease Neg Hx     Past medical history, social, surgical and family history all reviewed in electronic medical record.  No pertanent information unless stated regarding  to the chief complaint.   Review of Systems: No headache, visual changes, nausea, vomiting, diarrhea, constipation, dizziness, abdominal pain, skin rash, fevers, chills, night sweats, weight loss, swollen lymph nodes,  chest pain, shortness of breath, mood changes.  Positive muscle aches, body aches  Objective  Blood pressure (!) 150/90, pulse 75, height 6' (1.829 m), weight 196 lb 6 oz (89.1 kg), SpO2 98 %.  Systems examined below as of 05/22/17 General: NAD A&O x3 mood, affect normal  HEENT: Pupils equal, extraocular movements intact no nystagmus Respiratory: not short of breath at rest or with speaking Cardiovascular: No lower extremity edema, non tender Skin: Warm dry intact with no signs of infection or rash on extremities or on axial skeleton. Abdomen: Soft nontender, no masses Neuro: Cranial nerves  intact, neurovascularly intact in all extremities with 2+ DTRs and 2+ pulses. Gait antalgic walking with the aid of a cane MSK: Non tender with full range of motion and good stability and symmetric strength and tone of , elbows, wrist,  knee hips and ankles bilaterally.   Arthritic changes of multiple joints Neck: Inspection unremarkable. No palpable stepoffs. Positive Spurling's maneuver right-sided radicular symptoms in the C5 distribution. Significant loss of motion. Patient has worsening 5 of extension minimal sidebending bilaterally Grip strength and sensation normal in bilateral hands Mild strength weakness in the C6-C7 distribution No sensory change to C4 to T1 Negative Hoffman sign bilaterally Reflexes normal  Shoulder: Right Atrophy noted as performed 3+ out of 5 strength compared to the contralateral side Severe impingement with crepitus. Positive empty can and drop arm sign Positive labral pathology Weakness of the scapular muscles noted. No apprehension sign Contralateral shoulder has some mild crepitus with range of motion but 5 out of 5 strength for atrophy of this  musculature as well.  Procedure: Real-time Ultrasound Guided Injection of right glenohumeral joint Device: GE Logiq Q7  Ultrasound guided injection is preferred based studies that show increased duration, increased effect, greater accuracy, decreased procedural pain, increased response rate with ultrasound guided versus blind injection.  Verbal informed consent obtained.  Time-out conducted.  Noted no overlying erythema, induration, or other signs of local infection.  Skin prepped in a sterile fashion.  Local anesthesia: Topical Ethyl chloride.  With sterile technique and under real time ultrasound guidance:  Joint visualized.  23g 1  inch needle inserted posterior approach. Pictures taken for needle placement. Patient did have injection of 2 cc of 1% lidocaine, 2 cc of 0.5% Marcaine, and 1.0 cc of Kenalog 40 mg/dL. Completed without difficulty  Pain immediately resolved suggesting accurate placement of the medication.  Advised to call if fevers/chills, erythema, induration, drainage, or persistent bleeding.  Images permanently stored and available for review in the ultrasound unit.  Impression: Technically successful ultrasound guided injection.  Impression and Recommendations:  This case required medical decision making of moderate complexity.      Note: This dictation was prepared with Dragon dictation along with smaller phrase technology. Any transcriptional errors that result from this process are unintentional.

## 2017-05-22 NOTE — Assessment & Plan Note (Signed)
Repeat injection given today. We discussed icing regimen and home exercises. Once again consider different medications the patient is on multiple amount at this time. Patient does have somnolence with the gabapentin so unable to increase. Patient will follow-up with me every 10 weeks for injections.

## 2017-05-22 NOTE — Patient Instructions (Addendum)
Good to see you  [name] is your friend.  You know the drill  Xray of neck  See me again in 4 weeks

## 2017-05-23 ENCOUNTER — Ambulatory Visit: Payer: Medicare Other | Admitting: Family Medicine

## 2017-05-23 ENCOUNTER — Other Ambulatory Visit: Payer: Self-pay | Admitting: *Deleted

## 2017-05-23 DIAGNOSIS — M5412 Radiculopathy, cervical region: Secondary | ICD-10-CM

## 2017-06-03 ENCOUNTER — Other Ambulatory Visit: Payer: Self-pay | Admitting: Family Medicine

## 2017-06-03 DIAGNOSIS — Z77018 Contact with and (suspected) exposure to other hazardous metals: Secondary | ICD-10-CM

## 2017-06-03 DIAGNOSIS — M5412 Radiculopathy, cervical region: Secondary | ICD-10-CM

## 2017-06-04 ENCOUNTER — Ambulatory Visit
Admission: RE | Admit: 2017-06-04 | Discharge: 2017-06-04 | Disposition: A | Discharge status: Home / Self Care | Payer: Medicare Other | Source: Ambulatory Visit | Attending: Family Medicine | Admitting: Family Medicine

## 2017-06-04 DIAGNOSIS — Z77018 Contact with and (suspected) exposure to other hazardous metals: Secondary | ICD-10-CM

## 2017-06-04 DIAGNOSIS — M4802 Spinal stenosis, cervical region: Secondary | ICD-10-CM | POA: Diagnosis not present

## 2017-06-04 DIAGNOSIS — M5412 Radiculopathy, cervical region: Secondary | ICD-10-CM

## 2017-06-04 DIAGNOSIS — Z01818 Encounter for other preprocedural examination: Secondary | ICD-10-CM | POA: Diagnosis not present

## 2017-06-19 ENCOUNTER — Ambulatory Visit (INDEPENDENT_AMBULATORY_CARE_PROVIDER_SITE_OTHER): Payer: Medicare Other | Admitting: Family Medicine

## 2017-06-19 ENCOUNTER — Encounter: Payer: Self-pay | Admitting: Family Medicine

## 2017-06-19 VITALS — BP 120/74 | HR 53 | Ht 72.0 in | Wt 187.0 lb

## 2017-06-19 DIAGNOSIS — M4802 Spinal stenosis, cervical region: Secondary | ICD-10-CM | POA: Diagnosis not present

## 2017-06-19 DIAGNOSIS — M5412 Radiculopathy, cervical region: Secondary | ICD-10-CM

## 2017-06-19 MED ORDER — TRAMADOL HCL 50 MG PO TABS: 50.0000 mg | ORAL_TABLET | Freq: Two times a day (BID) | ORAL | 0 refills | Status: DC | PRN

## 2017-06-19 NOTE — Patient Instructions (Signed)
Good to see you  Tramadol up to 2 times a day and take it with a tylenol. May make you sleepy so try it at home first  We will get epidural of the neck if everyone says it is ok  If we do injection stop the plavix at least 3 days before the procedure and can restart 1 day after the procedure See me again in 3 weeks after injection

## 2017-06-19 NOTE — Progress Notes (Signed)
[name], [name] Phone: [phone] Subjective:    I'm seeing this patient by the request  of:  Biagio Borg, MD   FT:[id number] shoulder pain f/u  [id number]  [name] is a 71 y.o. male coming in with complaint of right shoulder pain. Patient was found to have more of a rotator cuff arthropathy as well as some acromioclavicular arthritis. Patient's was given a glenohumeral injection on 01/07/2017. Was to do conservative therapy otherwise. Patient does have the past medical history significant for an ACDF in the cervical spine.  Patient is asked having worsening pain. Worsening radicular symptoms going down the right hand. States it is noticing increasing weakness as well.  Patient's was sent for an MRI. Has shown and patient has had progression. Worsening symptoms overall.       Past Medical History:  Diagnosis Date  . ALLERGIC RHINITIS 11/06/2007  . Anemia, unspecified 08/22/2012  . BENIGN PROSTATIC HYPERTROPHY 07/14/2007  . COLONIC POLYPS, HX OF 07/14/2007  . Complication of anesthesia    pt states he needs to be cathed after surgery  . Degenerative arthritis of right knee 12/12/2011  . GERD 07/11/2007  . Headache(784.0) 07/11/2007  . HYPERLIPIDEMIA 07/11/2007  . HYPERTENSION 07/11/2007  . HYPERTHYROIDISM 07/14/2007  . HYPOTHYROIDISM 11/06/2007  . OSTEOARTHRITIS, KNEE, RIGHT 10/27/2008  . PERIPHERAL EDEMA 05/31/2009  . PULMONARY NODULE 06/24/2008   granuloma  . TIA (transient ischemic attack) 08/13/2015  . Type II or unspecified type diabetes mellitus without mention of complication, uncontrolled 08/22/2012   Patient states that he is not diabetic, not taking any medications  . Vitamin D deficiency 08/12/2015  . WOUND, LEG 05/31/2009   Past Surgical History:  Procedure Laterality Date  . ANTERIOR CERVICAL DECOMP/DISCECTOMY FUSION N/A 01/06/2013   Procedure: ANTERIOR CERVICAL DECOMPRESSION/DISCECTOMY FUSION 1  LEVEL;  Surgeon: Erline Levine, MD;  Location: [facility];  Service: Neurosurgery;  Laterality: N/A;  Cervical three-four Anterior cervical decompression/diskectomy/fusion  . BACK SURGERY  1986   x 2  1986 C 3  . HERNIA REPAIR  sept 1986  . left knee replacement  Nov 21, 2007  . NECK SURGERY    . NISSEN FUNDOPLICATION  3151  . right heel bone spur  1987  . TOTAL KNEE ARTHROPLASTY  01/09/2012   Procedure: TOTAL KNEE ARTHROPLASTY;  Surgeon: Tobi Bastos, MD;  Location: [facility];  Service: Orthopedics;  Laterality: Right;   Social History   Social History  . Marital status: Married    Spouse name: [name]  . Number of children: 2  . Years of education: HS   Occupational History  . Retired    Social History Main Topics  . Smoking status: Former Smoker    Packs/day: 3.00    Years: 30.00    Types: Cigarettes    Quit date: 11/26/1982  . Smokeless tobacco: Never Used  . Alcohol use No  . Drug use: No  . Sexual activity: Not Asked   Other Topics Concern  . None   Social History Narrative   Patient lives at home with spouse.   Caffiene Use: 4 cups daily   Allergies  Allergen Reactions  . Adhesive [Tape] Other (See Comments)    Rash, reddness   Paper Tape: As far as pt knows, okay.  . Codeine Other (See Comments)    Headaches   . Naproxen Diarrhea  . Prednisone Diarrhea  . Zolpidem Tartrate Other (See Comments)  hallucinations  . Penicillins Rash    whelps   Family History  Problem Relation Age of Onset  . Heart attack Mother   . Heart attack Father   . Emphysema Father   . Heart disease Other   . Diabetes Neg Hx   . Thyroid disease Neg Hx     Past medical history, social, surgical and family history all reviewed in electronic medical record.  No pertanent information unless stated regarding to the chief complaint.   Review of Systems: No headache, visual changes, nausea, vomiting, diarrhea, constipation, dizziness, abdominal pain, skin rash, fevers, chills,  night sweats, weight loss, swollen lymph nodes,  chest pain, shortness of breath, mood changes.  Positive muscle aches, body aches  Objective  Blood pressure 120/74, pulse (!) 53, height 6' (1.829 m), weight 187 lb (84.8 kg), SpO2 97 %.  Systems examined below as of 06/19/17 General: NAD A&O x3 mood, affect normal  HEENT: Pupils equal, extraocular movements intact no nystagmus Respiratory: not short of breath at rest or with speaking Cardiovascular: No lower extremity edema, non tender Skin: Warm dry intact with no signs of infection or rash on extremities or on axial skeleton. Abdomen: Soft nontender, no masses Neuro: Cranial nerves  intact, neurovascularly intact in all extremities with 2+ DTRs and 2+ pulses. Gait antalgic walking with the aid of a cane MSK: Non tender with full range of motion and good stability and symmetric strength and tone of , elbows, wrist,  knee hips and ankles bilaterally.   Arthritic changes of multiple joints Neck: Inspection unremarkable. No palpable stepoffs. Positive Spurling's maneuver right-sided radicular symptoms in the C5 distribution. Significant loss of motion. Patient has worsening 5 of extension minimal sidebending bilaterally Grip strength and sensation normal in bilateral hands Mild strength weakness in the C6-C7 distribution No sensory change to C4 to T1 Negative Hoffman sign bilaterally Reflexes normal No change from previous exam   Impression and Recommendations:     This case required medical decision making of moderate complexity.      Note: This dictation was prepared with Dragon dictation along with smaller phrase technology. Any transcriptional errors that result from this process are unintentional.

## 2017-06-19 NOTE — Assessment & Plan Note (Signed)
Worsening spinal stenosis at this time. Discussed with patient at great length. Patient elected to attempt a epidural steroid injection in the cervical spine. Patient is on Plavix and we'll send in question to the neurologist put him on the medication. Likely he will be able to hold this for 5 days while he has injection. She'll come back to 3 weeks afterwards. We discussed all the other treatment options. Tramadol given today.

## 2017-07-11 DIAGNOSIS — C44629 Squamous cell carcinoma of skin of left upper limb, including shoulder: Secondary | ICD-10-CM | POA: Diagnosis not present

## 2017-07-11 DIAGNOSIS — L570 Actinic keratosis: Secondary | ICD-10-CM | POA: Diagnosis not present

## 2017-07-16 ENCOUNTER — Telehealth: Payer: Self-pay | Admitting: *Deleted

## 2017-07-16 NOTE — Telephone Encounter (Signed)
Rec'd call pt wife states husband saw Dr. Tamala Julian on 06/19/17 inform him that he would send something in for pain. Wife states they never received anything, and he is wanting something for pain. Requesting rx to go to [facility]...[name]

## 2017-07-17 NOTE — Telephone Encounter (Signed)
Called pt no answer LMOM w/MD response.

## 2017-07-17 NOTE — Telephone Encounter (Signed)
We printed tramadol for him,  Will not do anything stronger We discussed epidural and was to check with other physicians I think that would be the most helpful.  Otherwise only thing left would be pain management

## 2017-07-23 ENCOUNTER — Ambulatory Visit
Admission: RE | Admit: 2017-07-23 | Discharge: 2017-07-23 | Disposition: A | Discharge status: Home / Self Care | Payer: Medicare Other | Source: Ambulatory Visit | Attending: Family Medicine | Admitting: Family Medicine

## 2017-07-23 DIAGNOSIS — M47817 Spondylosis without myelopathy or radiculopathy, lumbosacral region: Secondary | ICD-10-CM | POA: Diagnosis not present

## 2017-07-23 DIAGNOSIS — M5412 Radiculopathy, cervical region: Secondary | ICD-10-CM

## 2017-07-23 MED ORDER — TRIAMCINOLONE ACETONIDE 40 MG/ML IJ SUSP (RADIOLOGY)
60.0000 mg | Freq: Once | INTRAMUSCULAR | Status: AC
  Administered 2017-07-23: 60 mg via EPIDURAL

## 2017-07-23 MED ORDER — IOPAMIDOL (ISOVUE-M 300) INJECTION 61%
1.0000 mL | Freq: Once | INTRAMUSCULAR | Status: AC | PRN
  Administered 2017-07-23: 1 mL via EPIDURAL

## 2017-07-23 NOTE — Discharge Instructions (Signed)

## 2017-08-01 ENCOUNTER — Other Ambulatory Visit: Payer: Self-pay | Admitting: Internal Medicine

## 2017-08-15 ENCOUNTER — Other Ambulatory Visit (INDEPENDENT_AMBULATORY_CARE_PROVIDER_SITE_OTHER): Payer: Medicare Other

## 2017-08-15 ENCOUNTER — Ambulatory Visit: Payer: Self-pay

## 2017-08-15 ENCOUNTER — Ambulatory Visit (INDEPENDENT_AMBULATORY_CARE_PROVIDER_SITE_OTHER): Payer: Medicare Other | Admitting: Family Medicine

## 2017-08-15 ENCOUNTER — Encounter: Payer: Self-pay | Admitting: Family Medicine

## 2017-08-15 VITALS — BP 130/84 | HR 54 | Ht 72.0 in | Wt 192.0 lb

## 2017-08-15 DIAGNOSIS — G8929 Other chronic pain: Secondary | ICD-10-CM

## 2017-08-15 DIAGNOSIS — M25511 Pain in right shoulder: Secondary | ICD-10-CM

## 2017-08-15 DIAGNOSIS — M4802 Spinal stenosis, cervical region: Secondary | ICD-10-CM

## 2017-08-15 DIAGNOSIS — E119 Type 2 diabetes mellitus without complications: Secondary | ICD-10-CM

## 2017-08-15 DIAGNOSIS — M12811 Other specific arthropathies, not elsewhere classified, right shoulder: Secondary | ICD-10-CM | POA: Diagnosis not present

## 2017-08-15 DIAGNOSIS — M75101 Unspecified rotator cuff tear or rupture of right shoulder, not specified as traumatic: Secondary | ICD-10-CM

## 2017-08-15 LAB — HEPATIC FUNCTION PANEL
ALK PHOS: 42 U/L (ref 39–117)
ALT: 14 U/L (ref 0–53)
AST: 15 U/L (ref 0–37)
Albumin: 4.8 g/dL (ref 3.5–5.2)
BILIRUBIN DIRECT: 0.1 mg/dL (ref 0.0–0.3)
Total Bilirubin: 0.4 mg/dL (ref 0.2–1.2)
Total Protein: 7.7 g/dL (ref 6.0–8.3)

## 2017-08-15 LAB — LIPID PANEL
CHOL/HDL RATIO: 3
CHOLESTEROL: 135 mg/dL (ref 0–200)
HDL: 52.1 mg/dL (ref >39.00)
LDL CALC: 43 mg/dL (ref 0–99)
NONHDL: 83.34
Triglycerides: 200 mg/dL — ABNORMAL HIGH (ref 0.0–149.0)
VLDL: 40 mg/dL (ref 0.0–40.0)

## 2017-08-15 LAB — BASIC METABOLIC PANEL
BUN: 17 mg/dL (ref 6–23)
CALCIUM: 10.9 mg/dL — AB (ref 8.4–10.5)
CO2: 31 mEq/L (ref 19–32)
Chloride: 101 mEq/L (ref 96–112)
Creatinine, Ser: 1.06 mg/dL (ref 0.40–1.50)
GFR: 72.69 mL/min (ref >60.00)
Glucose, Bld: 91 mg/dL (ref 70–99)
Potassium: 4.1 mEq/L (ref 3.5–5.1)
Sodium: 140 mEq/L (ref 135–145)

## 2017-08-15 LAB — HEMOGLOBIN A1C: HEMOGLOBIN A1C: 7.1 % — AB (ref 4.6–6.5)

## 2017-08-15 MED ORDER — GABAPENTIN 400 MG PO CAPS: 400.0000 mg | ORAL_CAPSULE | Freq: Every day | ORAL | 1 refills | Status: DC

## 2017-08-15 MED ORDER — TRAMADOL HCL 50 MG PO TABS: 50.0000 mg | ORAL_TABLET | Freq: Two times a day (BID) | ORAL | 0 refills | Status: DC | PRN

## 2017-08-15 NOTE — Progress Notes (Signed)
[name] [place], [place] [address] Phone: [phone] Subjective:    I'm seeing this patient by the request  of:    CC: Neck pain follow-up  [id number]  [name] is a 71 y.o. male coming in with complaint of neck pain follow-up. Found to have severe degenerative changes with likely more of a neuropathy. Patient elected try an epidural injection. Patient had this on August 28. Patient states overall seems to be doing not any better after the injection. Having worsening right shoulder pain. Didn't fall onto the right side. Having more and more difficulty sleeping as well.     Past Medical History:  Diagnosis Date  . ALLERGIC RHINITIS 11/06/2007  . Anemia, unspecified 08/22/2012  . BENIGN PROSTATIC HYPERTROPHY 07/14/2007  . COLONIC POLYPS, HX OF 07/14/2007  . Complication of anesthesia    pt states he needs to be cathed after surgery  . Degenerative arthritis of right knee 12/12/2011  . GERD 07/11/2007  . Headache(784.0) 07/11/2007  . HYPERLIPIDEMIA 07/11/2007  . HYPERTENSION 07/11/2007  . HYPERTHYROIDISM 07/14/2007  . HYPOTHYROIDISM 11/06/2007  . OSTEOARTHRITIS, KNEE, RIGHT 10/27/2008  . PERIPHERAL EDEMA 05/31/2009  . PULMONARY NODULE 06/24/2008   granuloma  . TIA (transient ischemic attack) 08/13/2015  . Type II or unspecified type diabetes mellitus without mention of complication, uncontrolled 08/22/2012   Patient states that he is not diabetic, not taking any medications  . Vitamin D deficiency 08/12/2015  . WOUND, LEG 05/31/2009   Past Surgical History:  Procedure Laterality Date  . ANTERIOR CERVICAL DECOMP/DISCECTOMY FUSION N/A 01/06/2013   Procedure: ANTERIOR CERVICAL DECOMPRESSION/DISCECTOMY FUSION 1 LEVEL;  Surgeon: Erline Levine, MD;  Location: [facility];  Service: Neurosurgery;  Laterality: N/A;  Cervical three-four Anterior cervical decompression/diskectomy/fusion  . BACK SURGERY  1986   x 2  1986 C 3  . HERNIA REPAIR   sept 1986  . left knee replacement  Nov 21, 2007  . NECK SURGERY    . NISSEN FUNDOPLICATION  3785  . right heel bone spur  1987  . TOTAL KNEE ARTHROPLASTY  01/09/2012   Procedure: TOTAL KNEE ARTHROPLASTY;  Surgeon: Tobi Bastos, MD;  Location: [facility];  Service: Orthopedics;  Laterality: Right;   Social History   Social History  . Marital status: Married    Spouse name: [name]  . Number of children: 2  . Years of education: HS   Occupational History  . Retired    Social History Main Topics  . Smoking status: Former Smoker    Packs/day: 3.00    Years: 30.00    Types: Cigarettes    Quit date: 11/26/1982  . Smokeless tobacco: Never Used  . Alcohol use No  . Drug use: No  . Sexual activity: Not on file   Other Topics Concern  . Not on file   Social History Narrative   Patient lives at home with spouse.   Caffiene Use: 4 cups daily   Allergies  Allergen Reactions  . Adhesive [Tape] Other (See Comments)    Rash, reddness   Paper Tape: As far as pt knows, okay.  . Codeine Other (See Comments)    Headaches   . Naproxen Diarrhea  . Prednisone Diarrhea  . Zolpidem Tartrate Other (See Comments)    hallucinations  . Penicillins Rash    whelps   Family History  Problem Relation Age of Onset  . Heart attack Mother   . Heart attack Father   .  Emphysema Father   . Heart disease Other   . Diabetes Neg Hx   . Thyroid disease Neg Hx      Past medical history, social, surgical and family history all reviewed in electronic medical record.  No pertanent information unless stated regarding to the chief complaint.   Review of Systems:Review of systems updated and as accurate as of 08/15/17  No headache, visual changes, nausea, vomiting, diarrhea, constipation, dizziness, abdominal pain, skin rash, fevers, chills, night sweats, weight loss, swollen lymph nodes, body aches, joint swelling, muscle aches, chest pain, shortness of breath, mood changes.   Objective  Weight 192  lb (87.1 kg). Systems examined below as of 08/15/17   General: No apparent distress alert and oriented x3 mood and affect normal, dressed appropriately.  HEENT: Pupils equal, extraocular movements intact  Respiratory: Patient's speak in full sentences and does not appear short of breath  Cardiovascular: No lower extremity edema, non tender, no erythema  Skin: Warm dry intact with no signs of infection or rash on extremities or on axial skeleton.  Abdomen: Soft nontender  Neuro: Cranial nerves II through XII are intact, neurovascularly intact in all extremities with 2+ DTRs and 2+ pulses.  Lymph: No lymphadenopathy of posterior or anterior cervical chain or axillae bilaterally.  Gait antalgic gait  MSK:  Non tender with full range of motion and good stability and symmetric strength and tone of  elbows, wrist, hip, knee and ankles bilaterally.  Shoulder: Right Inspection atrophy noted of the musculature of the shoulder girdle bilaterally right greater than left Significant crepitus of the right shoulder Rotator cuff strength 3 out of 5 compared to the contralateral side Positive impingement Speeds and Yergason's tests normal. O'Brien's positive Painful arc and drop arm  Contralateral shoulder crepitus but no pain  Procedure: Real-time Ultrasound Guided Injection of right glenohumeral joint Device: GE Logiq Q7  Ultrasound guided injection is preferred based studies that show increased duration, increased effect, greater accuracy, decreased procedural pain, increased response rate with ultrasound guided versus blind injection.  Verbal informed consent obtained.  Time-out conducted.  Noted no overlying erythema, induration, or other signs of local infection.  Skin prepped in a sterile fashion.  Local anesthesia: Topical Ethyl chloride.  With sterile technique and under real time ultrasound guidance:  Joint visualized.  23g 1  inch needle inserted posterior approach. Pictures taken for  needle placement. Patient did have injection of 2 cc of 1% lidocaine, 2 cc of 0.5% Marcaine, and 1.0 cc of Kenalog 40 mg/dL. Completed without difficulty  Pain immediately resolved suggesting accurate placement of the medication.  Advised to call if fevers/chills, erythema, induration, drainage, or persistent bleeding.  Images permanently stored and available for review in the ultrasound unit.  Impression: Technically successful ultrasound guided injection.       Impression and Recommendations:     This case required medical decision making of moderate complexity.      Note: This dictation was prepared with Dragon dictation along with smaller phrase technology. Any transcriptional errors that result from this process are unintentional.

## 2017-08-15 NOTE — Assessment & Plan Note (Signed)
No improvement with the epidural. Patient will increase gabapentin to 400 mg. Not a surgical candidate.

## 2017-08-15 NOTE — Assessment & Plan Note (Signed)
Patient was given another injection today. Tolerated the procedure well. We discussed icing regimen and home exercises. Discussed which activities to do a which ones to avoid. Patient will start to increase activity. Tramadol prescription given. We discussed if any continue this long-term patient would need to be under pain management contract. Gabapentin increase to 400 mg

## 2017-08-15 NOTE — Patient Instructions (Addendum)
Good to see you  I am sorry the injection did not help  Tried to inject the shoulder again and hopefully will relieve some.  Ice is your friend.  Gabapentin 400mg  at night Tramadol up to 2 times a day with tylenol  See me again in 4 weeks.

## 2017-08-22 ENCOUNTER — Encounter: Payer: Self-pay | Admitting: Internal Medicine

## 2017-08-22 ENCOUNTER — Ambulatory Visit (INDEPENDENT_AMBULATORY_CARE_PROVIDER_SITE_OTHER): Payer: Medicare Other | Admitting: Internal Medicine

## 2017-08-22 VITALS — BP 142/90 | HR 60 | Temp 98.4°F | Ht 72.0 in | Wt 190.0 lb

## 2017-08-22 DIAGNOSIS — Z23 Encounter for immunization: Secondary | ICD-10-CM

## 2017-08-22 DIAGNOSIS — Z0000 Encounter for general adult medical examination without abnormal findings: Secondary | ICD-10-CM

## 2017-08-22 DIAGNOSIS — E119 Type 2 diabetes mellitus without complications: Secondary | ICD-10-CM | POA: Diagnosis not present

## 2017-08-22 DIAGNOSIS — I10 Essential (primary) hypertension: Secondary | ICD-10-CM | POA: Diagnosis not present

## 2017-08-22 DIAGNOSIS — E785 Hyperlipidemia, unspecified: Secondary | ICD-10-CM | POA: Diagnosis not present

## 2017-08-22 NOTE — Patient Instructions (Signed)
Please continue all other medications as before, and refills have been done if requested.  Please have the pharmacy call with any other refills you may need.  Please continue your efforts at being more active, low cholesterol diet, and weight control.  You are otherwise up to date with prevention measures today.  Please keep your appointments with your specialists as you may have planned  Please return in 6 months, or sooner if needed, with Lab testing done 3-5 days before  

## 2017-08-22 NOTE — Progress Notes (Signed)
Subjective:    Patient ID: [name], male    DOB: [birth year], 71 y.o.   MRN: [id number]  HPI  Here to f/u; overall doing ok,  Pt denies chest pain, increasing sob or doe, wheezing, orthopnea, PND, increased LE swelling, palpitations, dizziness or syncope.  Pt denies new neurological symptoms such as new headache, or facial or extremity weakness or numbness.  Pt denies polydipsia, polyuria, or low sugar episode.  Pt states overall good compliance with meds, mostly trying to follow appropriate diet, with wt overall stable,  but little exercise however.  Wt Readings from Last 3 Encounters:  08/22/17 190 lb (86.2 kg)  08/15/17 192 lb (87.1 kg)  06/19/17 187 lb (84.8 kg)   Past Medical History:  Diagnosis Date  . ALLERGIC RHINITIS 11/06/2007  . Anemia, unspecified 08/22/2012  . BENIGN PROSTATIC HYPERTROPHY 07/14/2007  . COLONIC POLYPS, HX OF 07/14/2007  . Complication of anesthesia    pt states he needs to be cathed after surgery  . Degenerative arthritis of right knee 12/12/2011  . GERD 07/11/2007  . Headache(784.0) 07/11/2007  . HYPERLIPIDEMIA 07/11/2007  . HYPERTENSION 07/11/2007  . HYPERTHYROIDISM 07/14/2007  . HYPOTHYROIDISM 11/06/2007  . OSTEOARTHRITIS, KNEE, RIGHT 10/27/2008  . PERIPHERAL EDEMA 05/31/2009  . PULMONARY NODULE 06/24/2008   granuloma  . TIA (transient ischemic attack) 08/13/2015  . Type II or unspecified type diabetes mellitus without mention of complication, uncontrolled 08/22/2012   Patient states that he is not diabetic, not taking any medications  . Vitamin D deficiency 08/12/2015  . WOUND, LEG 05/31/2009   Past Surgical History:  Procedure Laterality Date  . ANTERIOR CERVICAL DECOMP/DISCECTOMY FUSION N/A 01/06/2013   Procedure: ANTERIOR CERVICAL DECOMPRESSION/DISCECTOMY FUSION 1 LEVEL;  Surgeon: Erline Levine, MD;  Location: [facility];  Service: Neurosurgery;  Laterality: N/A;  Cervical three-four Anterior cervical decompression/diskectomy/fusion  . BACK SURGERY   1986   x 2  1986 C 3  . HERNIA REPAIR  sept 1986  . left knee replacement  Nov 21, 2007  . NECK SURGERY    . NISSEN FUNDOPLICATION  7416  . right heel bone spur  1987  . TOTAL KNEE ARTHROPLASTY  01/09/2012   Procedure: TOTAL KNEE ARTHROPLASTY;  Surgeon: Tobi Bastos, MD;  Location: [facility];  Service: Orthopedics;  Laterality: Right;    reports that he quit smoking about 34 years ago. His smoking use included Cigarettes. He has a 90.00 pack-year smoking history. He has never used smokeless tobacco. He reports that he does not drink alcohol or use drugs. family history includes Emphysema in his father; Heart attack in his father and mother; Heart disease in his other. Allergies  Allergen Reactions  . Adhesive [Tape] Other (See Comments)    Rash, reddness   Paper Tape: As far as pt knows, okay.  . Codeine Other (See Comments)    Headaches   . Naproxen Diarrhea  . Prednisone Diarrhea  . Zolpidem Tartrate Other (See Comments)    hallucinations  . Penicillins Rash    whelps   Current Outpatient Prescriptions on File Prior to Visit  Medication Sig Dispense Refill  . ACCU-CHEK SOFTCLIX LANCETS lancets USE TO CHECK BLOOD SUGARS  TWO TIMES DAILY 200 each 11  . amLODipine (NORVASC) 5 MG tablet Take 1 tablet (5 mg total) by mouth daily. 90 tablet 3  . Ascorbic Acid (VITAMIN C) 1000 MG tablet Take 1,000 mg by mouth daily.    Marland Kitchen aspirin EC 81 MG tablet Take 81 mg  by mouth daily.    Marland Kitchen atorvastatin (LIPITOR) 40 MG tablet Take 1 tablet (40 mg total) by mouth daily. 90 tablet 3  . Blood Glucose Monitoring Suppl (ACCU-CHEK AVIVA PLUS) w/Device KIT Use as directed 1 kit 0  . cetirizine (ZYRTEC) 10 MG tablet Take 1 tablet (10 mg total) by mouth daily. 90 tablet 3  . clopidogrel (PLAVIX) 75 MG tablet Take 1 tablet (75 mg total) by mouth daily. 90 tablet 3  . Coenzyme Q10 (CO Q-10 MAXIMUM STRENGTH) 400 MG CAPS Take 1 capsule by mouth daily.    . fenofibrate 160 MG tablet Take 1 tablet (160 mg total)  by mouth daily. 90 tablet 3  . gabapentin (NEURONTIN) 400 MG capsule Take 1 capsule (400 mg total) by mouth at bedtime. 90 capsule 1  . glucose blood (ACCU-CHEK AVIVA PLUS) test strip Use to check blood sugars  two times daily 200 each 3  . glucose blood (ONE TOUCH ULTRA TEST) test strip Use as instructed 100 each 12  . glucose blood test strip 1 each by Other route 2 (two) times daily. Use to check blood sugars twice a day Dx E11.9 300 each 2  . hydrochlorothiazide (HYDRODIURIL) 25 MG tablet Take 1 tablet (25 mg total) by mouth daily. 90 tablet 3  . labetalol (NORMODYNE) 300 MG tablet Take 1 tablet (300 mg total) by mouth 2 (two) times daily. 180 tablet 3  . levothyroxine (SYNTHROID, LEVOTHROID) 125 MCG tablet Take 1 tablet (125 mcg total) by mouth daily. 90 tablet 3  . metFORMIN (GLUCOPHAGE-XR) 500 MG 24 hr tablet Take 2 tablets (1,000 mg total) by mouth daily with breakfast. 180 tablet 3  . Omega-3 Fatty Acids (FISH OIL) 1000 MG CAPS Take 2 capsules by mouth daily.    . traMADol (ULTRAM) 50 MG tablet Take 1 tablet (50 mg total) by mouth every 12 (twelve) hours as needed. 60 tablet 0   No current facility-administered medications on file prior to visit.    Review of Systems  Constitutional: Negative for other unusual diaphoresis or sweats HENT: Negative for ear discharge or swelling Eyes: Negative for other worsening visual disturbances Respiratory: Negative for stridor or other swelling  Gastrointestinal: Negative for worsening distension or other blood Genitourinary: Negative for retention or other urinary change Musculoskeletal: Negative for other MSK pain or swelling Skin: Negative for color change or other new lesions Neurological: Negative for worsening tremors and other numbness  Psychiatric/Behavioral: Negative for worsening agitation or other fatigue All other system neg per pt    Objective:   Physical Exam BP (!) 142/90   Pulse 60   Temp 98.4 F (36.9 C) (Oral)   Ht 6'  (1.829 m)   Wt 190 lb (86.2 kg)   SpO2 98%   BMI 25.77 kg/m  VS noted,  Constitutional: Pt appears in NAD HENT: Head: NCAT.  Right Ear: External ear normal.  Left Ear: External ear normal.  Eyes: . Pupils are equal, round, and reactive to light. Conjunctivae and EOM are normal Nose: without d/c or deformity Neck: Neck supple. Gross normal ROM Cardiovascular: Normal rate and regular rhythm.   Pulmonary/Chest: Effort normal and breath sounds without rales or wheezing.  Abd:  Soft, NT, ND, + BS, no organomegaly Neurological: Pt is alert. At baseline orientation, motor grossly intact Skin: Skin is warm. No rashes, other new lesions, no LE edema Psychiatric: Pt behavior is normal without agitation  No other exam findings    Assessment & Plan:

## 2017-08-24 NOTE — Assessment & Plan Note (Signed)
stable overall by history and exam, recent data reviewed with pt, and pt to continue medical treatment as before,  to f/u any worsening symptoms or concerns Lab Results  Component Value Date   LDLCALC 43 08/15/2017

## 2017-08-24 NOTE — Assessment & Plan Note (Signed)
stable overall by history and exam, recent data reviewed with pt, and pt to continue medical treatment as before,  to f/u any worsening symptoms or concerns BP Readings from Last 3 Encounters:  08/22/17 (!) 142/90  08/15/17 130/84  07/23/17 (!) 200/97

## 2017-08-24 NOTE — Assessment & Plan Note (Signed)
stable overall by history and exam, recent data reviewed with pt, and pt to continue medical treatment as before,  to f/u any worsening symptoms or concerns Lab Results  Component Value Date   HGBA1C 7.1 (H) 08/15/2017

## 2017-09-12 ENCOUNTER — Ambulatory Visit (INDEPENDENT_AMBULATORY_CARE_PROVIDER_SITE_OTHER): Payer: Medicare Other | Admitting: Family Medicine

## 2017-09-12 ENCOUNTER — Other Ambulatory Visit (INDEPENDENT_AMBULATORY_CARE_PROVIDER_SITE_OTHER): Payer: Medicare Other

## 2017-09-12 ENCOUNTER — Encounter: Payer: Self-pay | Admitting: Family Medicine

## 2017-09-12 ENCOUNTER — Ambulatory Visit (INDEPENDENT_AMBULATORY_CARE_PROVIDER_SITE_OTHER)
Admission: RE | Admit: 2017-09-12 | Discharge: 2017-09-12 | Disposition: A | Discharge status: Home / Self Care | Payer: Medicare Other | Source: Ambulatory Visit | Attending: Family Medicine | Admitting: Family Medicine

## 2017-09-12 VITALS — BP 150/76 | HR 63 | Ht 72.0 in | Wt 192.0 lb

## 2017-09-12 DIAGNOSIS — M86672 Other chronic osteomyelitis, left ankle and foot: Secondary | ICD-10-CM | POA: Insufficient documentation

## 2017-09-12 DIAGNOSIS — L089 Local infection of the skin and subcutaneous tissue, unspecified: Secondary | ICD-10-CM | POA: Diagnosis not present

## 2017-09-12 DIAGNOSIS — M2550 Pain in unspecified joint: Secondary | ICD-10-CM

## 2017-09-12 HISTORY — DX: Other chronic osteomyelitis, left ankle and foot: M86.672

## 2017-09-12 HISTORY — DX: Pain in unspecified joint: M25.50

## 2017-09-12 LAB — COMPREHENSIVE METABOLIC PANEL
ALT: 16 U/L (ref 0–53)
AST: 15 U/L (ref 0–37)
Albumin: 4.6 g/dL (ref 3.5–5.2)
Alkaline Phosphatase: 47 U/L (ref 39–117)
BUN: 14 mg/dL (ref 6–23)
CALCIUM: 10.3 mg/dL (ref 8.4–10.5)
CHLORIDE: 105 meq/L (ref 96–112)
CO2: 29 meq/L (ref 19–32)
CREATININE: 1.13 mg/dL (ref 0.40–1.50)
GFR: 67.51 mL/min (ref >60.00)
Glucose, Bld: 110 mg/dL — ABNORMAL HIGH (ref 70–99)
POTASSIUM: 3.4 meq/L — AB (ref 3.5–5.1)
SODIUM: 144 meq/L (ref 135–145)
Total Bilirubin: 0.5 mg/dL (ref 0.2–1.2)
Total Protein: 7.4 g/dL (ref 6.0–8.3)

## 2017-09-12 LAB — CBC WITH DIFFERENTIAL/PLATELET
Basophils Absolute: 0 10*3/uL (ref 0.0–0.1)
Basophils Relative: 0.4 % (ref 0.0–3.0)
EOS ABS: 0.2 10*3/uL (ref 0.0–0.7)
Eosinophils Relative: 1.6 % (ref 0.0–5.0)
HCT: 43.3 % (ref 39.0–52.0)
HEMOGLOBIN: 14.3 g/dL (ref 13.0–17.0)
LYMPHS ABS: 1.9 10*3/uL (ref 0.7–4.0)
Lymphocytes Relative: 17 % (ref 12.0–46.0)
MCHC: 33.1 g/dL (ref 30.0–36.0)
MCV: 90.6 fl (ref 78.0–100.0)
MONO ABS: 1 10*3/uL (ref 0.1–1.0)
Monocytes Relative: 8.6 % (ref 3.0–12.0)
NEUTROS ABS: 8.3 10*3/uL — AB (ref 1.4–7.7)
NEUTROS PCT: 72.4 % (ref 43.0–77.0)
PLATELETS: 283 10*3/uL (ref 150.0–400.0)
RBC: 4.78 Mil/uL (ref 4.22–5.81)
RDW: 14.3 % (ref 11.5–15.5)
WBC: 11.4 10*3/uL — AB (ref 4.0–10.5)

## 2017-09-12 LAB — C-REACTIVE PROTEIN: CRP: 0.1 mg/dL — ABNORMAL LOW (ref 0.5–20.0)

## 2017-09-12 LAB — IBC PANEL
IRON: 114 ug/dL (ref 42–165)
Saturation Ratios: 24 % (ref 20.0–50.0)
TRANSFERRIN: 339 mg/dL (ref 212.0–360.0)

## 2017-09-12 LAB — SEDIMENTATION RATE: SED RATE: 12 mm/h (ref 0–20)

## 2017-09-12 LAB — VITAMIN D 25 HYDROXY (VIT D DEFICIENCY, FRACTURES): VITD: 14.52 ng/mL — ABNORMAL LOW (ref 30.00–100.00)

## 2017-09-12 MED ORDER — DOXYCYCLINE HYCLATE 100 MG PO TABS: 100.0000 mg | ORAL_TABLET | Freq: Two times a day (BID) | ORAL | 0 refills | Status: AC

## 2017-09-12 MED ORDER — CIPROFLOXACIN HCL 500 MG PO TABS: 500.0000 mg | ORAL_TABLET | Freq: Two times a day (BID) | ORAL | 0 refills | Status: AC

## 2017-09-12 MED ORDER — TRAMADOL HCL 50 MG PO TABS: 50.0000 mg | ORAL_TABLET | Freq: Two times a day (BID) | ORAL | 0 refills | Status: DC | PRN

## 2017-09-12 NOTE — Assessment & Plan Note (Signed)
Patient does have an osteomyelitis of the left toe. Patient does have a history of an forthcoming or infarction) could have some vascular compromise a could make this difficult to heal. Patient was put on doxycycline and ciprofloxacin at this time. I do believe that wound care and wound treatment as needed. Patient will follow-up with me if needed but hopefully wound care will be able to take care of this.

## 2017-09-12 NOTE — Progress Notes (Signed)
[name] [address] [place], [place] [address] Phone: [phone] Subjective:    I'm seeing this patient by the request  of:    CC: Shoulder pain follow-up  [id number]  TEE RICHESON is a 71 y.o. male coming in with complaint of shoulder pain. Seen 1 month ago and did have another injection in the shoulder. Known rotator cuff arthropathy. Patient states Minimal improvement. Continues to have significant amount of pain. Seems to be all on the body. Patient is frustrated by this. States that it is affecting daily activities and sleep.  Patient is also having toe pain. States that there is an open sore on the second toe. Left foot. Wants to have it evaluated. Denies fevers and chills but states that maybe discharge. Seems to be a blister first     Past Medical History:  Diagnosis Date  . ALLERGIC RHINITIS 11/06/2007  . Anemia, unspecified 08/22/2012  . BENIGN PROSTATIC HYPERTROPHY 07/14/2007  . COLONIC POLYPS, HX OF 07/14/2007  . Complication of anesthesia    pt states he needs to be cathed after surgery  . Degenerative arthritis of right knee 12/12/2011  . GERD 07/11/2007  . Headache(784.0) 07/11/2007  . HYPERLIPIDEMIA 07/11/2007  . HYPERTENSION 07/11/2007  . HYPERTHYROIDISM 07/14/2007  . HYPOTHYROIDISM 11/06/2007  . OSTEOARTHRITIS, KNEE, RIGHT 10/27/2008  . PERIPHERAL EDEMA 05/31/2009  . PULMONARY NODULE 06/24/2008   granuloma  . TIA (transient ischemic attack) 08/13/2015  . Type II or unspecified type diabetes mellitus without mention of complication, uncontrolled 08/22/2012   Patient states that he is not diabetic, not taking any medications  . Vitamin D deficiency 08/12/2015  . WOUND, LEG 05/31/2009   Past Surgical History:  Procedure Laterality Date  . ANTERIOR CERVICAL DECOMP/DISCECTOMY FUSION N/A 01/06/2013   Procedure: ANTERIOR CERVICAL DECOMPRESSION/DISCECTOMY FUSION 1 LEVEL;  Surgeon: Erline Levine, MD;  Location: [facility];  Service:  Neurosurgery;  Laterality: N/A;  Cervical three-four Anterior cervical decompression/diskectomy/fusion  . BACK SURGERY  1986   x 2  1986 C 3  . HERNIA REPAIR  sept 1986  . left knee replacement  Nov 21, 2007  . NECK SURGERY    . NISSEN FUNDOPLICATION  3664  . right heel bone spur  1987  . TOTAL KNEE ARTHROPLASTY  01/09/2012   Procedure: TOTAL KNEE ARTHROPLASTY;  Surgeon: Tobi Bastos, MD;  Location: [facility];  Service: Orthopedics;  Laterality: Right;   Social History   Social History  . Marital status: Married    Spouse name: [name]  . Number of children: 2  . Years of education: HS   Occupational History  . Retired    Social History Main Topics  . Smoking status: Former Smoker    Packs/day: 3.00    Years: 30.00    Types: Cigarettes    Quit date: 11/26/1982  . Smokeless tobacco: Never Used  . Alcohol use No  . Drug use: No  . Sexual activity: Not Asked   Other Topics Concern  . None   Social History Narrative   Patient lives at home with spouse.   Caffiene Use: 4 cups daily   Allergies  Allergen Reactions  . Adhesive [Tape] Other (See Comments)    Rash, reddness   Paper Tape: As far as pt knows, okay.  . Codeine Other (See Comments)    Headaches   . Naproxen Diarrhea  . Prednisone Diarrhea  . Zolpidem Tartrate Other (See Comments)    hallucinations  . Penicillins  Rash    whelps   Family History  Problem Relation Age of Onset  . Heart attack Mother   . Heart attack Father   . Emphysema Father   . Heart disease Other   . Diabetes Neg Hx   . Thyroid disease Neg Hx      Past medical history, social, surgical and family history all reviewed in electronic medical record.  No pertanent information unless stated regarding to the chief complaint.   Review of Systems:Review of systems updated and as accurate as of 09/12/17  No visual changes, nausea, vomiting, diarrhea, dizziness, abdominal pain, skin rash, fevers, chills, night sweats, weight loss, swollen  lymph nodes, chest pain, shortness of breath, mood changes. Positive headaches, constipation, abdominal pain, body aches, joint swelling, muscle aches  Objective  Blood pressure (!) 150/76, pulse 63, height 6' (1.829 m), weight 192 lb (87.1 kg), SpO2 97 %. Systems examined below as of 09/12/17   General: No apparent distress alert and oriented x3 mood and affect normal, dressed appropriately.  HEENT: Pupils equal, extraocular movements intact  Respiratory: Patient's speak in full sentences and does not appear short of breath  Cardiovascular: No lower extremity edema, non tender, no erythema  Skin: Warm dry intact with no signs of infection or rash on extremities or on axial skeleton.  Abdomen: Soft nontender  Neuro: Cranial nerves II through XII are intact, neurovascularly intact in all extremities with 2+ DTRs and 2+ pulses.  Lymph: No lymphadenopathy of posterior or anterior cervical chain or axillae bilaterally.  Gait mild antalgic.  MSK:  Moderate tender with full range of motion and good stability and symmetric strength and tone of  elbows, wrist, hip, knee and ankles bilaterally. Severe arthritic changes of multiple joints Shoulder: Right Inspection reveals severe atrophy of the muscle girdle Palpation is normal with no tenderness over AC joint or bicipital groove. Decreased range of motion in all planes. Significant crepitus. Patient does have positive impingement. Contralateral shoulder also suffered some crepitus with some weakness  Foot exam shows the patient does have breakdown of the transverse and longitudinal arch on the left side. Patient second toe on the dorsal aspect of the IP joint has what appears to be a nonhealing ulcer with some erythema surrounding the area. When pushed patient does have some expression of what appears to be pus. Foul-smelling discharge.     Impression and Recommendations:     This case required medical decision making of moderate  complexity.      Note: This dictation was prepared with Dragon dictation along with smaller phrase technology. Any transcriptional errors that result from this process are unintentional.

## 2017-09-12 NOTE — Assessment & Plan Note (Signed)
Patient is having polyarthralgia with chronic pain. Discussed with patient at great length about this. Has not responded to any of the conservative therapy or any type of injection at this time. Laboratory workup ordered to see if anything else could be contributing. We discussed icing regimen. Continue the other medications. Given a prescription for tramadol to try.

## 2017-09-12 NOTE — Patient Instructions (Addendum)
Good to see you  We will get labs and xray today  Wound care Doxy and cipro 2 times a day for 3 weeks.  Tramadol refilled  See me again in 2 weeks. If toe gets worse need to go to hospital

## 2017-09-13 DIAGNOSIS — I10 Essential (primary) hypertension: Secondary | ICD-10-CM | POA: Diagnosis not present

## 2017-09-13 DIAGNOSIS — M069 Rheumatoid arthritis, unspecified: Secondary | ICD-10-CM | POA: Diagnosis not present

## 2017-09-13 DIAGNOSIS — L97522 Non-pressure chronic ulcer of other part of left foot with fat layer exposed: Secondary | ICD-10-CM | POA: Diagnosis not present

## 2017-09-13 DIAGNOSIS — E11621 Type 2 diabetes mellitus with foot ulcer: Secondary | ICD-10-CM | POA: Diagnosis not present

## 2017-09-13 DIAGNOSIS — J449 Chronic obstructive pulmonary disease, unspecified: Secondary | ICD-10-CM | POA: Diagnosis not present

## 2017-09-13 LAB — PTH, INTACT AND CALCIUM
Calcium: 10.5 mg/dL — ABNORMAL HIGH (ref 8.6–10.3)
PTH: 5 pg/mL — ABNORMAL LOW (ref 14–64)

## 2017-09-13 LAB — RHEUMATOID FACTOR

## 2017-09-13 LAB — CALCIUM, IONIZED: Calcium, Ion: 5.7 mg/dL — ABNORMAL HIGH (ref 4.8–5.6)

## 2017-09-13 LAB — ANTI-NUCLEAR AB-TITER (ANA TITER): ANA Titer 1: 1:40 {titer} — ABNORMAL HIGH

## 2017-09-13 LAB — ANA: Anti Nuclear Antibody(ANA): POSITIVE — AB

## 2017-09-20 DIAGNOSIS — L97522 Non-pressure chronic ulcer of other part of left foot with fat layer exposed: Secondary | ICD-10-CM | POA: Diagnosis not present

## 2017-09-20 DIAGNOSIS — E11621 Type 2 diabetes mellitus with foot ulcer: Secondary | ICD-10-CM | POA: Diagnosis not present

## 2017-09-20 LAB — HM DIABETES EYE EXAM

## 2017-09-23 DIAGNOSIS — E11621 Type 2 diabetes mellitus with foot ulcer: Secondary | ICD-10-CM | POA: Diagnosis not present

## 2017-09-23 DIAGNOSIS — L97522 Non-pressure chronic ulcer of other part of left foot with fat layer exposed: Secondary | ICD-10-CM | POA: Diagnosis not present

## 2017-09-26 ENCOUNTER — Telehealth: Payer: Self-pay | Admitting: *Deleted

## 2017-09-26 ENCOUNTER — Encounter: Payer: Self-pay | Admitting: Family Medicine

## 2017-09-26 ENCOUNTER — Ambulatory Visit (INDEPENDENT_AMBULATORY_CARE_PROVIDER_SITE_OTHER): Payer: Medicare Other | Admitting: Family Medicine

## 2017-09-26 ENCOUNTER — Other Ambulatory Visit: Payer: Medicare Other

## 2017-09-26 DIAGNOSIS — E559 Vitamin D deficiency, unspecified: Secondary | ICD-10-CM

## 2017-09-26 NOTE — Assessment & Plan Note (Signed)
Spent  25 minutes with patient face-to-face and had greater than 50% of counseling including as described in assessment and plan.History of hypercalcemia that seems to be worsening. No significant findings at this time. Could be secondary to the high do core thiazide and may need to continue considering different changes. We'll get laboratory workup this point. Multiple myeloma is within the differential. We discussed icing regimen and home exercises. We discussed which activities to do a which ones to avoid. Patient will start to increase activity as tolerated. Depending on the laboratory workup will discuss further management. He may need pain management as well.

## 2017-09-26 NOTE — Addendum Note (Signed)
Addended by: Sallye Ober on: 09/26/2017 01:38 PM   Modules accepted: Orders

## 2017-09-26 NOTE — Patient Instructions (Signed)
Good to see you  We will work up the hypercalcemia Go down and see if we can get a little more direction with some more information from labs.  Once I get the labs lets look at them and I will call you to discuss what is next

## 2017-09-26 NOTE — Telephone Encounter (Signed)
-----   Message from Lyndal Pulley, DO sent at 09/25/2017  7:48 AM EDT ----- Please call and tell him other labs are needed due to high calcium level.  ----- Message ----- From: Interface, Lab In Three Zero One Sent: 09/12/2017   4:03 PM To: Lyndal Pulley, DO

## 2017-09-26 NOTE — Assessment & Plan Note (Signed)
Patient is low but this does not correspond with the hypercalcemia

## 2017-09-26 NOTE — Progress Notes (Signed)
[name] [place], [place] [address] Phone: [phone] Subjective:     CC:  hypercalcemia  [id number]  [name] is a 71 y.o. male coming in with complaint of lots of pain.  Found to have hypercalcemia with low PTH.  Pain is worsening overall. States affecting daily activities. Patient seen elsewhere his for now with care. Feels like it is improving. States that his pain is significantly worse over the body. Gabapentin making only minimal improvement. No significant improvement with the tramadol. Here to discuss labs otherwise.       Past Medical History:  Diagnosis Date  . ALLERGIC RHINITIS 11/06/2007  . Anemia, unspecified 08/22/2012  . BENIGN PROSTATIC HYPERTROPHY 07/14/2007  . COLONIC POLYPS, HX OF 07/14/2007  . Complication of anesthesia    pt states he needs to be cathed after surgery  . Degenerative arthritis of right knee 12/12/2011  . GERD 07/11/2007  . Headache(784.0) 07/11/2007  . HYPERLIPIDEMIA 07/11/2007  . HYPERTENSION 07/11/2007  . HYPERTHYROIDISM 07/14/2007  . HYPOTHYROIDISM 11/06/2007  . OSTEOARTHRITIS, KNEE, RIGHT 10/27/2008  . PERIPHERAL EDEMA 05/31/2009  . PULMONARY NODULE 06/24/2008   granuloma  . TIA (transient ischemic attack) 08/13/2015  . Type II or unspecified type diabetes mellitus without mention of complication, uncontrolled 08/22/2012   Patient states that he is not diabetic, not taking any medications  . Vitamin D deficiency 08/12/2015  . WOUND, LEG 05/31/2009   Past Surgical History:  Procedure Laterality Date  . ANTERIOR CERVICAL DECOMP/DISCECTOMY FUSION N/A 01/06/2013   Procedure: ANTERIOR CERVICAL DECOMPRESSION/DISCECTOMY FUSION 1 LEVEL;  Surgeon: Erline Levine, MD;  Location: [facility];  Service: Neurosurgery;  Laterality: N/A;  Cervical three-four Anterior cervical decompression/diskectomy/fusion  . BACK SURGERY  1986   x 2  1986 C 3  . HERNIA REPAIR  sept 1986  . left knee replacement  Nov 21, 2007  . NECK SURGERY    . NISSEN FUNDOPLICATION  2376  . right heel bone spur  1987  . TOTAL KNEE ARTHROPLASTY  01/09/2012   Procedure: TOTAL KNEE ARTHROPLASTY;  Surgeon: Tobi Bastos, MD;  Location: [facility];  Service: Orthopedics;  Laterality: Right;   Social History   Social History  . Marital status: Married    Spouse name: [name]  . Number of children: 2  . Years of education: HS   Occupational History  . Retired    Social History Main Topics  . Smoking status: Former Smoker    Packs/day: 3.00    Years: 30.00    Types: Cigarettes    Quit date: 11/26/1982  . Smokeless tobacco: Never Used  . Alcohol use No  . Drug use: No  . Sexual activity: Not Asked   Other Topics Concern  . None   Social History Narrative   Patient lives at home with spouse.   Caffiene Use: 4 cups daily   Allergies  Allergen Reactions  . Adhesive [Tape] Other (See Comments)    Rash, reddness   Paper Tape: As far as pt knows, okay.  . Codeine Other (See Comments)    Headaches   . Naproxen Diarrhea  . Prednisone Diarrhea  . Zolpidem Tartrate Other (See Comments)    hallucinations  . Penicillins Rash    whelps   Family History  Problem Relation Age of Onset  . Heart attack Mother   . Heart attack Father   . Emphysema Father   . Heart disease Other   . Diabetes  Neg Hx   . Thyroid disease Neg Hx      Past medical history, social, surgical and family history all reviewed in electronic medical record.  No pertanent information unless stated regarding to the chief complaint.   Review of Systems:Review of systems updated and as accurate as of 09/26/17  No headache, visual changes, nausea, vomiting, diarrhea, constipation, dizziness, abdominal pain, skin rash, fevers, chills, night sweats, weight loss, swollen lymph nodes, chest pain, shortness of breath, mood changes. Positive muscle aches, body aches, joint swelling  Objective  Blood pressure 128/80, pulse 65, height 6' (1.829 m),  weight 198 lb (89.8 kg), SpO2 97 %. Systems examined below as of 09/26/17   General: No apparent distress alert and oriented x3 mood and affect normal, dressed appropriately.  HEENT: Pupils equal, extraocular movements intact  Respiratory: Patient's speak in full sentences and does appear short of breath  Cardiovascular: No lower extremity edema, non tender, no erythema  Skin: Warm dry intact with no signs of infection or rash on extremities or on axial skeleton.  Abdomen: Soft nontender  Neuro: Cranial nerves II through XII are intact, neurovascularly intact in all extremities with 2+ DTRs and 2+ pulses.  Lymph: No lymphadenopathy of posterior or anterior cervical chain or axillae bilaterally.  Gait antalgic gait MSK: Severe arthritic changes or tenderness of all joints and muscles at this time.    Impression and Recommendations:     This case required medical decision making of moderate complexity.      Note: This dictation was prepared with Dragon dictation along with smaller phrase technology. Any transcriptional errors that result from this process are unintentional.

## 2017-09-26 NOTE — Addendum Note (Signed)
Addended by: Sallye Ober on: 09/26/2017 01:28 PM   Modules accepted: Orders

## 2017-09-26 NOTE — Addendum Note (Signed)
Addended by: Sallye Ober on: 09/26/2017 01:27 PM   Modules accepted: Orders

## 2017-09-30 ENCOUNTER — Other Ambulatory Visit: Payer: Medicare Other

## 2017-09-30 DIAGNOSIS — L97522 Non-pressure chronic ulcer of other part of left foot with fat layer exposed: Secondary | ICD-10-CM | POA: Diagnosis not present

## 2017-09-30 DIAGNOSIS — E11621 Type 2 diabetes mellitus with foot ulcer: Secondary | ICD-10-CM | POA: Diagnosis not present

## 2017-10-01 DIAGNOSIS — I87323 Chronic venous hypertension (idiopathic) with inflammation of bilateral lower extremity: Secondary | ICD-10-CM | POA: Diagnosis not present

## 2017-10-01 DIAGNOSIS — M79605 Pain in left leg: Secondary | ICD-10-CM | POA: Diagnosis not present

## 2017-10-01 DIAGNOSIS — M79604 Pain in right leg: Secondary | ICD-10-CM | POA: Diagnosis not present

## 2017-10-01 LAB — KAPPA/LAMBDA LIGHT CHAINS
KAPPA FREE LGHT CHN: 22.7 mg/L — AB (ref 3.3–19.4)
Kappa:Lambda Ratio: 2.25 — ABNORMAL HIGH (ref 0.26–1.65)
Lambda Free Lght Chn: 10.1 mg/L (ref 5.7–26.3)

## 2017-10-01 LAB — PROTEIN ELECTROPHORESIS, SERUM
ALBUMIN ELP: 4.4 g/dL (ref 3.8–4.8)
ALPHA 1: 0.3 g/dL (ref 0.2–0.3)
Alpha 2: 0.8 g/dL (ref 0.5–0.9)
Beta 2: 0.3 g/dL (ref 0.2–0.5)
Beta Globulin: 0.5 g/dL (ref 0.4–0.6)
GAMMA GLOBULIN: 0.9 g/dL (ref 0.8–1.7)
Total Protein: 7.3 g/dL (ref 6.1–8.1)

## 2017-10-01 LAB — PTH-RELATED PEPTIDE: PTH-Related Protein (PTH-RP): 9 pg/mL — ABNORMAL LOW (ref 14–27)

## 2017-10-01 LAB — PATHOLOGIST SMEAR REVIEW

## 2017-10-02 LAB — UPEP/UIFE/LIGHT CHAINS/TP, 24-HR UR
% BETA, URINE: 0 %
ALBUMIN, U: 100 %
ALPHA 1 URINE: 0 %
ALPHA-2-GLOBULIN, U: 0 %
FREE LAMBDA LT CHAINS, UR: 0.08 mg/L — AB (ref 0.24–6.66)
Free Kappa Lt Chains,Ur: 6.03 mg/L (ref 1.35–24.19)
GAMMA GLOBULIN URINE: 0 %
KAPPA/LAMBDA RATIO, U: 75.38 — AB (ref 2.04–10.37)
PROTEIN 24H UR: 143 mg/(24.h) (ref 30–150)
PROTEIN UR: 5 mg/dL

## 2017-10-07 DIAGNOSIS — E11621 Type 2 diabetes mellitus with foot ulcer: Secondary | ICD-10-CM | POA: Diagnosis not present

## 2017-10-07 DIAGNOSIS — L97529 Non-pressure chronic ulcer of other part of left foot with unspecified severity: Secondary | ICD-10-CM | POA: Diagnosis not present

## 2017-10-07 DIAGNOSIS — L97522 Non-pressure chronic ulcer of other part of left foot with fat layer exposed: Secondary | ICD-10-CM | POA: Diagnosis not present

## 2017-10-14 DIAGNOSIS — I10 Essential (primary) hypertension: Secondary | ICD-10-CM | POA: Diagnosis not present

## 2017-10-14 DIAGNOSIS — L97522 Non-pressure chronic ulcer of other part of left foot with fat layer exposed: Secondary | ICD-10-CM | POA: Diagnosis not present

## 2017-10-14 DIAGNOSIS — E11621 Type 2 diabetes mellitus with foot ulcer: Secondary | ICD-10-CM | POA: Diagnosis not present

## 2017-10-14 DIAGNOSIS — I87323 Chronic venous hypertension (idiopathic) with inflammation of bilateral lower extremity: Secondary | ICD-10-CM | POA: Diagnosis not present

## 2017-10-15 DIAGNOSIS — L97529 Non-pressure chronic ulcer of other part of left foot with unspecified severity: Secondary | ICD-10-CM | POA: Diagnosis not present

## 2017-10-15 DIAGNOSIS — K5730 Diverticulosis of large intestine without perforation or abscess without bleeding: Secondary | ICD-10-CM | POA: Diagnosis not present

## 2017-10-15 DIAGNOSIS — I700 Atherosclerosis of aorta: Secondary | ICD-10-CM | POA: Diagnosis not present

## 2017-10-15 DIAGNOSIS — I70203 Unspecified atherosclerosis of native arteries of extremities, bilateral legs: Secondary | ICD-10-CM | POA: Diagnosis not present

## 2017-10-15 DIAGNOSIS — K449 Diaphragmatic hernia without obstruction or gangrene: Secondary | ICD-10-CM | POA: Diagnosis not present

## 2017-10-15 DIAGNOSIS — R252 Cramp and spasm: Secondary | ICD-10-CM | POA: Diagnosis not present

## 2017-10-16 ENCOUNTER — Telehealth: Payer: Self-pay | Admitting: Internal Medicine

## 2017-10-16 MED ORDER — METFORMIN HCL ER 500 MG PO TB24: 1000.0000 mg | ORAL_TABLET | Freq: Every day | ORAL | 1 refills | Status: DC

## 2017-10-16 NOTE — Telephone Encounter (Signed)
Pt has been informed and expressed understandings

## 2017-10-16 NOTE — Telephone Encounter (Signed)
Ok, this is done erx

## 2017-10-18 DIAGNOSIS — I739 Peripheral vascular disease, unspecified: Secondary | ICD-10-CM | POA: Diagnosis not present

## 2017-10-21 DIAGNOSIS — E11621 Type 2 diabetes mellitus with foot ulcer: Secondary | ICD-10-CM | POA: Diagnosis not present

## 2017-10-21 DIAGNOSIS — L97529 Non-pressure chronic ulcer of other part of left foot with unspecified severity: Secondary | ICD-10-CM | POA: Diagnosis not present

## 2017-10-21 DIAGNOSIS — I87323 Chronic venous hypertension (idiopathic) with inflammation of bilateral lower extremity: Secondary | ICD-10-CM | POA: Diagnosis not present

## 2017-10-21 DIAGNOSIS — I10 Essential (primary) hypertension: Secondary | ICD-10-CM | POA: Diagnosis not present

## 2017-11-22 ENCOUNTER — Telehealth: Payer: Self-pay | Admitting: Internal Medicine

## 2017-11-22 NOTE — Telephone Encounter (Signed)
Returned call to patient regarding AWV. Patient's voicemail is full and cannot leave a message. Mr. Bair is eligible to receive AWV. Last AWV 08/2016. Patient can schedule AWV at anytime with Health Coach Nurse. SF.

## 2017-12-05 ENCOUNTER — Other Ambulatory Visit: Payer: Self-pay | Admitting: Internal Medicine

## 2018-01-24 ENCOUNTER — Other Ambulatory Visit: Payer: Self-pay | Admitting: Internal Medicine

## 2018-02-13 ENCOUNTER — Other Ambulatory Visit (INDEPENDENT_AMBULATORY_CARE_PROVIDER_SITE_OTHER): Payer: Medicare Other

## 2018-02-13 DIAGNOSIS — E119 Type 2 diabetes mellitus without complications: Secondary | ICD-10-CM | POA: Diagnosis not present

## 2018-02-13 DIAGNOSIS — Z0000 Encounter for general adult medical examination without abnormal findings: Secondary | ICD-10-CM

## 2018-02-13 LAB — URINALYSIS, ROUTINE W REFLEX MICROSCOPIC
BILIRUBIN URINE: NEGATIVE
Hgb urine dipstick: NEGATIVE
KETONES UR: NEGATIVE
Leukocytes, UA: NEGATIVE
NITRITE: NEGATIVE
PH: 5.5 (ref 5.0–8.0)
RBC / HPF: NONE SEEN (ref 0–?)
Specific Gravity, Urine: 1.01 (ref 1.000–1.030)
Total Protein, Urine: NEGATIVE
URINE GLUCOSE: NEGATIVE
Urobilinogen, UA: 0.2 (ref 0.0–1.0)

## 2018-02-13 LAB — CBC WITH DIFFERENTIAL/PLATELET
BASOS ABS: 0.1 10*3/uL (ref 0.0–0.1)
Basophils Relative: 0.8 % (ref 0.0–3.0)
Eosinophils Absolute: 0.4 10*3/uL (ref 0.0–0.7)
Eosinophils Relative: 4.5 % (ref 0.0–5.0)
HCT: 41.5 % (ref 39.0–52.0)
Hemoglobin: 14 g/dL (ref 13.0–17.0)
LYMPHS ABS: 1.8 10*3/uL (ref 0.7–4.0)
Lymphocytes Relative: 19.9 % (ref 12.0–46.0)
MCHC: 33.8 g/dL (ref 30.0–36.0)
MCV: 90.3 fl (ref 78.0–100.0)
MONO ABS: 0.8 10*3/uL (ref 0.1–1.0)
Monocytes Relative: 8.9 % (ref 3.0–12.0)
NEUTROS ABS: 5.9 10*3/uL (ref 1.4–7.7)
NEUTROS PCT: 65.9 % (ref 43.0–77.0)
PLATELETS: 270 10*3/uL (ref 150.0–400.0)
RBC: 4.59 Mil/uL (ref 4.22–5.81)
RDW: 13.3 % (ref 11.5–15.5)
WBC: 9 10*3/uL (ref 4.0–10.5)

## 2018-02-13 LAB — PSA: PSA: 1.42 ng/mL (ref 0.10–4.00)

## 2018-02-13 LAB — MICROALBUMIN / CREATININE URINE RATIO
Creatinine,U: 37.2 mg/dL
Microalb Creat Ratio: 1.9 mg/g (ref 0.0–30.0)
Microalb, Ur: <0.7 mg/dL (ref 0.0–1.9)

## 2018-02-13 LAB — HEMOGLOBIN A1C: HEMOGLOBIN A1C: 7.6 % — AB (ref 4.6–6.5)

## 2018-02-13 LAB — TSH: TSH: 1.77 u[IU]/mL (ref 0.35–4.50)

## 2018-02-14 LAB — HEPATIC FUNCTION PANEL
ALBUMIN: 4.6 g/dL (ref 3.5–5.2)
ALK PHOS: 59 U/L (ref 39–117)
ALT: 22 U/L (ref 0–53)
AST: 21 U/L (ref 0–37)
BILIRUBIN TOTAL: 0.3 mg/dL (ref 0.2–1.2)
Bilirubin, Direct: 0.1 mg/dL (ref 0.0–0.3)
Total Protein: 7.3 g/dL (ref 6.0–8.3)

## 2018-02-14 LAB — LIPID PANEL
CHOL/HDL RATIO: 5
Cholesterol: 160 mg/dL (ref 0–200)
HDL: 35.3 mg/dL — AB (ref >39.00)
NonHDL: 125.08
TRIGLYCERIDES: 326 mg/dL — AB (ref 0.0–149.0)
VLDL: 65.2 mg/dL — AB (ref 0.0–40.0)

## 2018-02-14 LAB — BASIC METABOLIC PANEL
BUN: 18 mg/dL (ref 6–23)
CHLORIDE: 104 meq/L (ref 96–112)
CO2: 25 meq/L (ref 19–32)
Calcium: 11.3 mg/dL — ABNORMAL HIGH (ref 8.4–10.5)
Creatinine, Ser: 1.13 mg/dL (ref 0.40–1.50)
GFR: 67.43 mL/min (ref >60.00)
GLUCOSE: 200 mg/dL — AB (ref 70–99)
Potassium: 4.4 mEq/L (ref 3.5–5.1)
SODIUM: 143 meq/L (ref 135–145)

## 2018-02-14 LAB — LDL CHOLESTEROL, DIRECT: Direct LDL: 92 mg/dL

## 2018-02-17 ENCOUNTER — Other Ambulatory Visit: Payer: Self-pay | Admitting: Internal Medicine

## 2018-02-20 ENCOUNTER — Ambulatory Visit: Payer: Medicare Other | Admitting: Internal Medicine

## 2018-02-20 ENCOUNTER — Encounter: Payer: Self-pay | Admitting: Internal Medicine

## 2018-02-20 VITALS — BP 128/84 | HR 71 | Temp 98.0°F | Ht 72.0 in | Wt 195.0 lb

## 2018-02-20 DIAGNOSIS — Z0000 Encounter for general adult medical examination without abnormal findings: Secondary | ICD-10-CM

## 2018-02-20 DIAGNOSIS — E119 Type 2 diabetes mellitus without complications: Secondary | ICD-10-CM | POA: Diagnosis not present

## 2018-02-20 MED ORDER — ATORVASTATIN CALCIUM 40 MG PO TABS: 40.0000 mg | ORAL_TABLET | Freq: Every day | ORAL | 3 refills | Status: DC

## 2018-02-20 MED ORDER — FENOFIBRATE 160 MG PO TABS
160.0000 mg | ORAL_TABLET | Freq: Every day | ORAL | 3 refills | Status: DC
Start: 2018-02-20 — End: 2019-02-26

## 2018-02-20 MED ORDER — METFORMIN HCL ER 500 MG PO TB24: 1000.0000 mg | ORAL_TABLET | Freq: Every day | ORAL | 3 refills | Status: DC

## 2018-02-20 MED ORDER — LABETALOL HCL 300 MG PO TABS: 300.0000 mg | ORAL_TABLET | Freq: Two times a day (BID) | ORAL | 3 refills | Status: DC

## 2018-02-20 MED ORDER — SITAGLIPTIN PHOSPHATE 50 MG PO TABS: 50.0000 mg | ORAL_TABLET | Freq: Every day | ORAL | 3 refills | Status: DC

## 2018-02-20 MED ORDER — LEVOTHYROXINE SODIUM 125 MCG PO TABS: 125.0000 ug | ORAL_TABLET | Freq: Every day | ORAL | 3 refills | Status: DC

## 2018-02-20 MED ORDER — GABAPENTIN 400 MG PO CAPS: 400.0000 mg | ORAL_CAPSULE | Freq: Every day | ORAL | 1 refills | Status: DC

## 2018-02-20 MED ORDER — CLOPIDOGREL BISULFATE 75 MG PO TABS: 75.0000 mg | ORAL_TABLET | Freq: Every day | ORAL | 3 refills | Status: DC

## 2018-02-20 MED ORDER — AMLODIPINE BESYLATE 5 MG PO TABS: 5.0000 mg | ORAL_TABLET | Freq: Every day | ORAL | 3 refills | Status: DC

## 2018-02-20 MED ORDER — HYDROCHLOROTHIAZIDE 25 MG PO TABS: 25.0000 mg | ORAL_TABLET | Freq: Every day | ORAL | 3 refills | Status: DC

## 2018-02-20 MED ORDER — CETIRIZINE HCL 10 MG PO TABS: 10.0000 mg | ORAL_TABLET | Freq: Every day | ORAL | 3 refills | Status: DC

## 2018-02-20 NOTE — Progress Notes (Signed)
Subjective:    Patient ID: [name], male    DOB: 01-29-1946, 72 y.o.   MRN: [id number]  HPI  Here for wellness and f/u;  Overall doing ok;  Pt denies Chest pain, worsening SOB, DOE, wheezing, orthopnea, PND, worsening LE edema, palpitations, dizziness or syncope.  Pt denies neurological change such as new headache, facial or extremity weakness.  Pt denies polydipsia, polyuria, or low sugar symptoms. Pt states overall good compliance with treatment and medications, good tolerability, and has been trying to follow appropriate diet.  Pt denies worsening depressive symptoms, suicidal ideation or panic. No fever, night sweats, wt loss, loss of appetite, or other constitutional symptoms.  Pt states good ability with ADL's, has low fall risk, home safety reviewed and adequate, no other significant changes in hearing or vision, and not active with exercise.  No new complaints or interval hx.  Past Medical History:  Diagnosis Date  . ALLERGIC RHINITIS 11/06/2007  . Anemia, unspecified 08/22/2012  . BENIGN PROSTATIC HYPERTROPHY 07/14/2007  . COLONIC POLYPS, HX OF 07/14/2007  . Complication of anesthesia    pt states he needs to be cathed after surgery  . Degenerative arthritis of right knee 12/12/2011  . GERD 07/11/2007  . Headache(784.0) 07/11/2007  . HYPERLIPIDEMIA 07/11/2007  . HYPERTENSION 07/11/2007  . HYPERTHYROIDISM 07/14/2007  . HYPOTHYROIDISM 11/06/2007  . OSTEOARTHRITIS, KNEE, RIGHT 10/27/2008  . PERIPHERAL EDEMA 05/31/2009  . PULMONARY NODULE 06/24/2008   granuloma  . TIA (transient ischemic attack) 08/13/2015  . Type II or unspecified type diabetes mellitus without mention of complication, uncontrolled 08/22/2012   Patient states that he is not diabetic, not taking any medications  . Vitamin D deficiency 08/12/2015  . WOUND, LEG 05/31/2009   Past Surgical History:  Procedure Laterality Date  . ANTERIOR CERVICAL DECOMP/DISCECTOMY FUSION N/A 01/06/2013   Procedure: ANTERIOR CERVICAL  DECOMPRESSION/DISCECTOMY FUSION 1 LEVEL;  Surgeon: Erline Levine, MD;  Location: [facility];  Service: Neurosurgery;  Laterality: N/A;  Cervical three-four Anterior cervical decompression/diskectomy/fusion  . BACK SURGERY  1986   x 2  1986 C 3  . HERNIA REPAIR  sept 1986  . left knee replacement  Nov 21, 2007  . NECK SURGERY    . NISSEN FUNDOPLICATION  7829  . right heel bone spur  1987  . TOTAL KNEE ARTHROPLASTY  01/09/2012   Procedure: TOTAL KNEE ARTHROPLASTY;  Surgeon: Tobi Bastos, MD;  Location: [facility];  Service: Orthopedics;  Laterality: Right;    reports that he quit smoking about 35 years ago. His smoking use included cigarettes. He has a 90.00 pack-year smoking history. He has never used smokeless tobacco. He reports that he does not drink alcohol or use drugs. family history includes Emphysema in his father; Heart attack in his father and mother; Heart disease in his other. Allergies  Allergen Reactions  . Adhesive [Tape] Other (See Comments)    Rash, reddness   Paper Tape: As far as pt knows, okay.  . Codeine Other (See Comments)    Headaches   . Naproxen Diarrhea  . Prednisone Diarrhea  . Zolpidem Tartrate Other (See Comments)    hallucinations  . Penicillins Rash    whelps   Current Outpatient Medications on File Prior to Visit  Medication Sig Dispense Refill  . ACCU-CHEK AVIVA PLUS test strip USE TO CHECK BLOOD SUGARS  TWO TIMES DAILY 100 each 2  . ACCU-CHEK SOFTCLIX LANCETS lancets USE TO CHECK BLOOD SUGARS  TWO TIMES DAILY 200 each 11  .  Ascorbic Acid (VITAMIN C) 1000 MG tablet Take 1,000 mg by mouth daily.    Marland Kitchen aspirin EC 81 MG tablet Take 81 mg by mouth daily.    . Blood Glucose Monitoring Suppl (ACCU-CHEK AVIVA PLUS) w/Device KIT Use as directed 1 kit 0  . Coenzyme Q10 (CO Q-10 MAXIMUM STRENGTH) 400 MG CAPS Take 1 capsule by mouth daily.    Marland Kitchen glucose blood (ONE TOUCH ULTRA TEST) test strip Use as instructed 100 each 12  . glucose blood test strip 1 each by  Other route 2 (two) times daily. Use to check blood sugars twice a day Dx E11.9 300 each 2  . Omega-3 Fatty Acids (FISH OIL) 1000 MG CAPS Take 2 capsules by mouth daily.    . traMADol (ULTRAM) 50 MG tablet Take 1 tablet (50 mg total) by mouth every 12 (twelve) hours as needed. 60 tablet 0   No current facility-administered medications on file prior to visit.    Review of Systems  Constitutional: Negative for other unusual diaphoresis or sweats HENT: Negative for ear discharge or swelling Eyes: Negative for other worsening visual disturbances Respiratory: Negative for stridor or other swelling  Gastrointestinal: Negative for worsening distension or other blood Genitourinary: Negative for retention or other urinary change Musculoskeletal: Negative for other MSK pain or swelling Skin: Negative for color change or other new lesions Neurological: Negative for worsening tremors and other numbness  Psychiatric/Behavioral: Negative for worsening agitation or other fatigue All other system neg per pt    Objective:   Physical Exam BP 128/84   Pulse 71   Temp 98 F (36.7 C) (Oral)   Ht 6' (1.829 m)   Wt 195 lb (88.5 kg)   SpO2 97%   BMI 26.45 kg/m  VS noted,  Constitutional: Pt is oriented to person, place, and time. Appears well-developed and well-nourished, in no significant distress and comfortable Head: Normocephalic and atraumatic  Eyes: Conjunctivae and EOM are normal. Pupils are equal, round, and reactive to light Right Ear: External ear normal without discharge Left Ear: External ear normal without discharge Nose: Nose without discharge or deformity Mouth/Throat: Oropharynx is without other ulcerations and moist  Neck: Normal range of motion. Neck supple. No JVD present. No tracheal deviation present or significant neck LA or mass Cardiovascular: Normal rate, regular rhythm, normal heart sounds and intact distal pulses.   Pulmonary/Chest: WOB normal and breath sounds without  rales or wheezing  Abdominal: Soft. Bowel sounds are normal. NT. No HSM  Musculoskeletal: Normal range of motion. Exhibits no edema Lymphadenopathy: Has no other cervical adenopathy.  Neurological: Pt is alert and oriented to person, place, and time. Pt has normal reflexes. No cranial nerve deficit. Motor grossly intact, Gait intact Skin: Skin is warm and dry. No rash noted or new ulcerations Psychiatric:  Has normal mood and affect. Behavior is normal without agitation No other exam findings Lab Results  Component Value Date   HGBA1C 7.6 (H) 02/13/2018      Assessment & Plan:

## 2018-02-20 NOTE — Patient Instructions (Addendum)
Please take all new medication as prescribed - the januvia 50 mg per day  Please continue all other medications as before, and refills have been done if requested.  Please have the pharmacy call with any other refills you may need.  Please continue your efforts at being more active, low cholesterol diet, and weight control.  You are otherwise up to date with prevention measures today.  Please keep your appointments with your specialists as you may have planned  Please return in 6 months, or sooner if needed, with Lab testing done 3-5 days before

## 2018-02-23 ENCOUNTER — Encounter: Payer: Self-pay | Admitting: Internal Medicine

## 2018-02-23 NOTE — Assessment & Plan Note (Signed)
Mild uncontrolled, for add januvia 50 qd,  to f/u any worsening symptoms or concerns

## 2018-02-23 NOTE — Assessment & Plan Note (Signed)

## 2018-03-03 ENCOUNTER — Telehealth: Payer: Self-pay | Admitting: Internal Medicine

## 2018-03-03 NOTE — Telephone Encounter (Signed)
Copied from [facility] [id number]. Topic: Quick Communication - Rx Refill/Question >> Mar 03, 2018  1:36 PM Neva Seat wrote: sitaGLIPtin (JANUVIA) 50 MG tablet was $150 copay Pt is requesting another one be called in with less copay per Dr. Jenny Reichmann.

## 2018-03-03 NOTE — Telephone Encounter (Signed)
Pt has been informed to contact his insurance company to see what covered alternatives they do cover. He expressed understanding and will call back with an update.

## 2018-04-01 ENCOUNTER — Other Ambulatory Visit: Payer: Self-pay | Admitting: Internal Medicine

## 2018-07-15 DIAGNOSIS — L821 Other seborrheic keratosis: Secondary | ICD-10-CM | POA: Diagnosis not present

## 2018-07-15 DIAGNOSIS — D1801 Hemangioma of skin and subcutaneous tissue: Secondary | ICD-10-CM | POA: Diagnosis not present

## 2018-07-15 DIAGNOSIS — L578 Other skin changes due to chronic exposure to nonionizing radiation: Secondary | ICD-10-CM | POA: Diagnosis not present

## 2018-07-15 DIAGNOSIS — C44629 Squamous cell carcinoma of skin of left upper limb, including shoulder: Secondary | ICD-10-CM | POA: Diagnosis not present

## 2018-07-25 ENCOUNTER — Other Ambulatory Visit: Payer: Self-pay | Admitting: Internal Medicine

## 2018-08-25 ENCOUNTER — Other Ambulatory Visit (INDEPENDENT_AMBULATORY_CARE_PROVIDER_SITE_OTHER): Payer: Medicare Other

## 2018-08-25 DIAGNOSIS — E119 Type 2 diabetes mellitus without complications: Secondary | ICD-10-CM | POA: Diagnosis not present

## 2018-08-25 LAB — LIPID PANEL
Cholesterol: 151 mg/dL (ref 0–200)
HDL: 33.1 mg/dL — AB (ref >39.00)
NonHDL: 117.83
Total CHOL/HDL Ratio: 5
Triglycerides: 267 mg/dL — ABNORMAL HIGH (ref 0.0–149.0)
VLDL: 53.4 mg/dL — AB (ref 0.0–40.0)

## 2018-08-25 LAB — BASIC METABOLIC PANEL
BUN: 20 mg/dL (ref 6–23)
CO2: 29 mEq/L (ref 19–32)
Calcium: 10.4 mg/dL (ref 8.4–10.5)
Chloride: 102 mEq/L (ref 96–112)
Creatinine, Ser: 1.22 mg/dL (ref 0.40–1.50)
GFR: 61.63 mL/min (ref >60.00)
Glucose, Bld: 164 mg/dL — ABNORMAL HIGH (ref 70–99)
POTASSIUM: 3.9 meq/L (ref 3.5–5.1)
SODIUM: 141 meq/L (ref 135–145)

## 2018-08-25 LAB — HEPATIC FUNCTION PANEL
ALBUMIN: 4.4 g/dL (ref 3.5–5.2)
ALT: 22 U/L (ref 0–53)
AST: 20 U/L (ref 0–37)
Alkaline Phosphatase: 51 U/L (ref 39–117)
Bilirubin, Direct: 0.1 mg/dL (ref 0.0–0.3)
TOTAL PROTEIN: 7.3 g/dL (ref 6.0–8.3)
Total Bilirubin: 0.5 mg/dL (ref 0.2–1.2)

## 2018-08-25 LAB — LDL CHOLESTEROL, DIRECT: LDL DIRECT: 84 mg/dL

## 2018-08-25 LAB — HEMOGLOBIN A1C: Hgb A1c MFr Bld: 8.2 % — ABNORMAL HIGH (ref 4.6–6.5)

## 2018-08-27 ENCOUNTER — Encounter: Payer: Self-pay | Admitting: Internal Medicine

## 2018-08-27 ENCOUNTER — Ambulatory Visit: Payer: Medicare Other | Admitting: Internal Medicine

## 2018-08-27 VITALS — BP 114/78 | HR 58 | Temp 98.3°F | Ht 72.0 in | Wt 188.0 lb

## 2018-08-27 DIAGNOSIS — I10 Essential (primary) hypertension: Secondary | ICD-10-CM | POA: Diagnosis not present

## 2018-08-27 DIAGNOSIS — Z23 Encounter for immunization: Secondary | ICD-10-CM

## 2018-08-27 DIAGNOSIS — E119 Type 2 diabetes mellitus without complications: Secondary | ICD-10-CM

## 2018-08-27 DIAGNOSIS — E785 Hyperlipidemia, unspecified: Secondary | ICD-10-CM

## 2018-08-27 DIAGNOSIS — Z0000 Encounter for general adult medical examination without abnormal findings: Secondary | ICD-10-CM

## 2018-08-27 MED ORDER — ATORVASTATIN CALCIUM 80 MG PO TABS: 80.0000 mg | ORAL_TABLET | Freq: Every day | ORAL | 3 refills | Status: DC

## 2018-08-27 MED ORDER — METFORMIN HCL ER 500 MG PO TB24: 2000.0000 mg | ORAL_TABLET | Freq: Every day | ORAL | 3 refills | Status: DC

## 2018-08-27 MED ORDER — ZOSTER VAC RECOMB ADJUVANTED 50 MCG/0.5ML IM SUSR: 0.5000 mL | Freq: Once | INTRAMUSCULAR | 1 refills | Status: AC

## 2018-08-27 NOTE — Assessment & Plan Note (Signed)
Mild uncontrolled, ok for increase lipitor to 80 qd, cont DM diet,  to f/u any worsening symptoms or concerns

## 2018-08-27 NOTE — Progress Notes (Signed)
Subjective:    Patient ID: [name], male    DOB: [birth year], 72 y.o.   MRN: [id number]  HPI  Here to f/u; overall doing ok,  Pt denies chest pain, increasing sob or doe, wheezing, orthopnea, PND, increased LE swelling, palpitations, dizziness or syncope.  Pt denies new neurological symptoms such as new headache, or facial or extremity weakness or numbness.  Pt denies polydipsia, polyuria, or low sugar episode.  Pt states overall good compliance with meds, mostly trying to follow appropriate diet, with wt overall stable,  but little exercise however.  No new complaints. Does have some ongoing difficulty with ambulating with cane and hx of falls, but none recent.  Januvia was too expensive, so not taking this.  Good compliance with other meds Past Medical History:  Diagnosis Date  . ALLERGIC RHINITIS 11/06/2007  . Anemia, unspecified 08/22/2012  . BENIGN PROSTATIC HYPERTROPHY 07/14/2007  . COLONIC POLYPS, HX OF 07/14/2007  . Complication of anesthesia    pt states he needs to be cathed after surgery  . Degenerative arthritis of right knee 12/12/2011  . GERD 07/11/2007  . Headache(784.0) 07/11/2007  . HYPERLIPIDEMIA 07/11/2007  . HYPERTENSION 07/11/2007  . HYPERTHYROIDISM 07/14/2007  . HYPOTHYROIDISM 11/06/2007  . OSTEOARTHRITIS, KNEE, RIGHT 10/27/2008  . PERIPHERAL EDEMA 05/31/2009  . PULMONARY NODULE 06/24/2008   granuloma  . TIA (transient ischemic attack) 08/13/2015  . Type II or unspecified type diabetes mellitus without mention of complication, uncontrolled 08/22/2012   Patient states that he is not diabetic, not taking any medications  . Vitamin D deficiency 08/12/2015  . WOUND, LEG 05/31/2009   Past Surgical History:  Procedure Laterality Date  . ANTERIOR CERVICAL DECOMP/DISCECTOMY FUSION N/A 01/06/2013   Procedure: ANTERIOR CERVICAL DECOMPRESSION/DISCECTOMY FUSION 1 LEVEL;  Surgeon: Erline Levine, MD;  Location: [facility];  Service: Neurosurgery;  Laterality: N/A;  Cervical three-four  Anterior cervical decompression/diskectomy/fusion  . BACK SURGERY  1986   x 2  1986 C 3  . HERNIA REPAIR  sept 1986  . left knee replacement  Nov 21, 2007  . NECK SURGERY    . NISSEN FUNDOPLICATION  7544  . right heel bone spur  1987  . TOTAL KNEE ARTHROPLASTY  01/09/2012   Procedure: TOTAL KNEE ARTHROPLASTY;  Surgeon: Tobi Bastos, MD;  Location: [facility];  Service: Orthopedics;  Laterality: Right;    reports that he quit smoking about 35 years ago. His smoking use included cigarettes. He has a 90.00 pack-year smoking history. He has never used smokeless tobacco. He reports that he does not drink alcohol or use drugs. family history includes Emphysema in his father; Heart attack in his father and mother; Heart disease in his other. Allergies  Allergen Reactions  . Adhesive [Tape] Other (See Comments)    Rash, reddness   Paper Tape: As far as pt knows, okay.  . Codeine Other (See Comments)    Headaches   . Naproxen Diarrhea  . Prednisone Diarrhea  . Zolpidem Tartrate Other (See Comments)    hallucinations  . Penicillins Rash    whelps   Current Outpatient Medications on File Prior to Visit  Medication Sig Dispense Refill  . ACCU-CHEK AVIVA PLUS test strip USE TO CHECK BLOOD SUGARS 2 TIMES DAILY 100 each 2  . ACCU-CHEK SOFTCLIX LANCETS lancets USE TO CHECK BLOOD SUGARS  TWO TIMES DAILY 200 each 11  . amLODipine (NORVASC) 5 MG tablet Take 1 tablet (5 mg total) by mouth daily. 90 tablet 3  .  aspirin EC 81 MG tablet Take 81 mg by mouth daily.    . Blood Glucose Monitoring Suppl (ACCU-CHEK AVIVA PLUS) w/Device KIT Use as directed 1 kit 0  . cetirizine (ZYRTEC) 10 MG tablet Take 1 tablet (10 mg total) by mouth daily. 90 tablet 3  . clopidogrel (PLAVIX) 75 MG tablet Take 1 tablet (75 mg total) by mouth daily. 90 tablet 3  . Coenzyme Q10 (CO Q-10 MAXIMUM STRENGTH) 400 MG CAPS Take 1 capsule by mouth daily.    . fenofibrate 160 MG tablet Take 1 tablet (160 mg total) by mouth daily. 90  tablet 3  . gabapentin (NEURONTIN) 400 MG capsule TAKE 1 CAPSULE BY MOUTH AT  BEDTIME 90 capsule 1  . glucose blood (ONE TOUCH ULTRA TEST) test strip Use as instructed 100 each 12  . glucose blood test strip 1 each by Other route 2 (two) times daily. Use to check blood sugars twice a day Dx E11.9 300 each 2  . hydrochlorothiazide (HYDRODIURIL) 25 MG tablet Take 1 tablet (25 mg total) by mouth daily. 90 tablet 3  . labetalol (NORMODYNE) 300 MG tablet Take 1 tablet (300 mg total) by mouth 2 (two) times daily. 180 tablet 3  . levothyroxine (SYNTHROID, LEVOTHROID) 125 MCG tablet Take 1 tablet (125 mcg total) by mouth daily. 90 tablet 3  . Omega-3 Fatty Acids (FISH OIL) 1000 MG CAPS Take 2 capsules by mouth daily.    . traMADol (ULTRAM) 50 MG tablet Take 1 tablet (50 mg total) by mouth every 12 (twelve) hours as needed. 60 tablet 0   No current facility-administered medications on file prior to visit.    Review of Systems  Constitutional: Negative for other unusual diaphoresis or sweats HENT: Negative for ear discharge or swelling Eyes: Negative for other worsening visual disturbances Respiratory: Negative for stridor or other swelling  Gastrointestinal: Negative for worsening distension or other blood Genitourinary: Negative for retention or other urinary change Musculoskeletal: Negative for other MSK pain or swelling Skin: Negative for color change or other new lesions Neurological: Negative for worsening tremors and other numbness  Psychiatric/Behavioral: Negative for worsening agitation or other fatigue All other system neg per pt    Objective:   Physical Exam BP 114/78   Pulse (!) 58   Temp 98.3 F (36.8 C) (Oral)   Ht 6' (1.829 m)   Wt 188 lb (85.3 kg)   SpO2 94%   BMI 25.50 kg/m  VS noted,  Constitutional: Pt appears in NAD HENT: Head: NCAT.  Right Ear: External ear normal.  Left Ear: External ear normal.  Eyes: . Pupils are equal, round, and reactive to light.  Conjunctivae and EOM are normal Nose: without d/c or deformity Neck: Neck supple. Gross normal ROM Cardiovascular: Normal rate and regular rhythm.   Pulmonary/Chest: Effort normal and breath sounds without rales or wheezing.  Abd:  Soft, NT, ND, + BS, no organomegaly Neurological: Pt is alert. At baseline orientation Skin: Skin is warm. No rashes, other new lesions, no LE edema Psychiatric: Pt behavior is normal without agitation  No other exam findings Lab Results  Component Value Date   WBC 9.0 02/13/2018   HGB 14.0 02/13/2018   HCT 41.5 02/13/2018   PLT 270.0 02/13/2018   GLUCOSE 164 (H) 08/25/2018   CHOL 151 08/25/2018   TRIG 267.0 (H) 08/25/2018   HDL 33.10 (L) 08/25/2018   LDLDIRECT 84.0 08/25/2018   LDLCALC 43 08/15/2017   ALT 22 08/25/2018   AST 20 08/25/2018  NA 141 08/25/2018   K 3.9 08/25/2018   CL 102 08/25/2018   CREATININE 1.22 08/25/2018   BUN 20 08/25/2018   CO2 29 08/25/2018   TSH 1.77 02/13/2018   PSA 1.42 02/13/2018   INR 0.97 01/01/2012   HGBA1C 8.2 (H) 08/25/2018   MICROALBUR <0.7 02/13/2018       Assessment & Plan:

## 2018-08-27 NOTE — Assessment & Plan Note (Signed)
Uncontrolled, to try increased metformin ER 500 to 4 tabs in the am, and to call if not tolerable with worsening GI effects such that we would need to reduce again and add another such as trulicity (has not been able to get the [place] as not covered by insurance)

## 2018-08-27 NOTE — Patient Instructions (Addendum)
You had the flu shot today  Your shingles shot prescription was sent to [facility]  OK to increase the lipitor to 80 mg per day (sent to [facility])  Ok to increase the metformin ER 500 mg to 4 pills in the AM (or you could take it 2 pills twice per day if this is better for you due to the number of pills) - (sent to [facility])  Please continue all other medications as before, and refills have been done if requested.  Please have the pharmacy call with any other refills you may need.  Please continue your efforts at being more active, low cholesterol diet, and weight control.  Please keep your appointments with your specialists as you may have planned  Please return in 6 months, or sooner if needed, with Lab testing done 3-5 days before

## 2018-08-27 NOTE — Assessment & Plan Note (Signed)
stable overall by history and exam, recent data reviewed with pt, and pt to continue medical treatment as before,  to f/u any worsening symptoms or concerns  

## 2018-09-15 ENCOUNTER — Other Ambulatory Visit: Payer: Self-pay | Admitting: Internal Medicine

## 2018-09-18 ENCOUNTER — Emergency Department (HOSPITAL_COMMUNITY): Payer: Medicare Other

## 2018-09-18 ENCOUNTER — Encounter (HOSPITAL_COMMUNITY): Payer: Self-pay

## 2018-09-18 ENCOUNTER — Emergency Department (HOSPITAL_COMMUNITY)
Admission: EM | Admit: 2018-09-18 | Discharge: 2018-09-18 | Disposition: A | Discharge status: Home / Self Care | Payer: Medicare Other | Attending: Emergency Medicine | Admitting: Emergency Medicine

## 2018-09-18 DIAGNOSIS — S199XXA Unspecified injury of neck, initial encounter: Secondary | ICD-10-CM | POA: Diagnosis not present

## 2018-09-18 DIAGNOSIS — E785 Hyperlipidemia, unspecified: Secondary | ICD-10-CM | POA: Insufficient documentation

## 2018-09-18 DIAGNOSIS — R519 Headache, unspecified: Secondary | ICD-10-CM

## 2018-09-18 DIAGNOSIS — R509 Fever, unspecified: Secondary | ICD-10-CM | POA: Diagnosis not present

## 2018-09-18 DIAGNOSIS — Z8673 Personal history of transient ischemic attack (TIA), and cerebral infarction without residual deficits: Secondary | ICD-10-CM | POA: Diagnosis not present

## 2018-09-18 DIAGNOSIS — R51 Headache: Secondary | ICD-10-CM | POA: Diagnosis not present

## 2018-09-18 DIAGNOSIS — S299XXA Unspecified injury of thorax, initial encounter: Secondary | ICD-10-CM | POA: Diagnosis not present

## 2018-09-18 DIAGNOSIS — I10 Essential (primary) hypertension: Secondary | ICD-10-CM | POA: Insufficient documentation

## 2018-09-18 DIAGNOSIS — Z043 Encounter for examination and observation following other accident: Secondary | ICD-10-CM | POA: Insufficient documentation

## 2018-09-18 DIAGNOSIS — E119 Type 2 diabetes mellitus without complications: Secondary | ICD-10-CM | POA: Insufficient documentation

## 2018-09-18 DIAGNOSIS — Z79899 Other long term (current) drug therapy: Secondary | ICD-10-CM | POA: Diagnosis not present

## 2018-09-18 DIAGNOSIS — R42 Dizziness and giddiness: Secondary | ICD-10-CM | POA: Diagnosis not present

## 2018-09-18 DIAGNOSIS — S0990XA Unspecified injury of head, initial encounter: Secondary | ICD-10-CM | POA: Diagnosis not present

## 2018-09-18 DIAGNOSIS — Z7902 Long term (current) use of antithrombotics/antiplatelets: Secondary | ICD-10-CM | POA: Insufficient documentation

## 2018-09-18 DIAGNOSIS — W19XXXA Unspecified fall, initial encounter: Secondary | ICD-10-CM

## 2018-09-18 LAB — BASIC METABOLIC PANEL
ANION GAP: 10 (ref 5–15)
BUN: 15 mg/dL (ref 8–23)
CALCIUM: 10.8 mg/dL — AB (ref 8.9–10.3)
CHLORIDE: 102 mmol/L (ref 98–111)
CO2: 25 mmol/L (ref 22–32)
Creatinine, Ser: 1.24 mg/dL (ref 0.61–1.24)
GFR calc non Af Amer: 56 mL/min — ABNORMAL LOW (ref >60)
Glucose, Bld: 185 mg/dL — ABNORMAL HIGH (ref 70–99)
POTASSIUM: 3.2 mmol/L — AB (ref 3.5–5.1)
Sodium: 137 mmol/L (ref 135–145)

## 2018-09-18 LAB — CBC
HCT: 41.5 % (ref 39.0–52.0)
Hemoglobin: 13.2 g/dL (ref 13.0–17.0)
MCH: 29.5 pg (ref 26.0–34.0)
MCHC: 31.8 g/dL (ref 30.0–36.0)
MCV: 92.6 fL (ref 80.0–100.0)
NRBC: 0 % (ref 0.0–0.2)
PLATELETS: 281 10*3/uL (ref 150–400)
RBC: 4.48 MIL/uL (ref 4.22–5.81)
RDW: 12.7 % (ref 11.5–15.5)
WBC: 16.4 10*3/uL — ABNORMAL HIGH (ref 4.0–10.5)

## 2018-09-18 LAB — URINALYSIS, ROUTINE W REFLEX MICROSCOPIC
Bilirubin Urine: NEGATIVE
GLUCOSE, UA: NEGATIVE mg/dL
Hgb urine dipstick: NEGATIVE
Ketones, ur: NEGATIVE mg/dL
Leukocytes, UA: NEGATIVE
Nitrite: NEGATIVE
PROTEIN: NEGATIVE mg/dL
Specific Gravity, Urine: 1.021 (ref 1.005–1.030)
pH: 5 (ref 5.0–8.0)

## 2018-09-18 LAB — I-STAT TROPONIN, ED: TROPONIN I, POC: 0 ng/mL (ref 0.00–0.08)

## 2018-09-18 LAB — CBG MONITORING, ED: Glucose-Capillary: 204 mg/dL — ABNORMAL HIGH (ref 70–99)

## 2018-09-18 MED ORDER — ALUM & MAG HYDROXIDE-SIMETH 200-200-20 MG/5ML PO SUSP
30.0000 mL | Freq: Once | ORAL | Status: AC
  Administered 2018-09-18: 30 mL via ORAL
  Filled 2018-09-18: qty 30

## 2018-09-18 MED ORDER — LIDOCAINE VISCOUS HCL 2 % MT SOLN
15.0000 mL | Freq: Once | OROMUCOSAL | Status: AC
  Administered 2018-09-18: 15 mL via ORAL
  Filled 2018-09-18: qty 15

## 2018-09-18 NOTE — Discharge Instructions (Signed)
Your work-up today did not show evidence of new stroke or traumatic injuries.  As you are able to ambulate on reassessment we felt you were safe to go home after our shared decision making conversation.  Please use her walker exclusively and stay hydrated.  Please follow-up with your primary doctor in the next day or 2.  If any symptoms change or worsen, please return to the nearest emergency department.

## 2018-09-18 NOTE — ED Notes (Signed)
Patient transported to X-ray 

## 2018-09-18 NOTE — ED Provider Notes (Signed)
Patient placed in Quick Look pathway, seen and evaluated   Chief Complaint: fall  HPI: [name] is a 72 y.o. male who presents to the ED s/p fall. Patient reports he feels weak. Denies any injury with the fall. EMS was called and paramedic reports patient was pale and appeared weak. Patient denies head injury or LOC. Patient was lying on the floor about 30 minutes before he called out to his wife.   ROS: Neuro: weakness  Physical Exam:  BP (!) 107/58 (BP Location: Right Arm)   Pulse 69   Temp 99.2 F (37.3 C) (Oral)   Resp 17   Ht 6' (1.829 m)   Wt 85.3 kg   SpO2 96%   BMI 25.50 kg/m    Gen: No distress  Neuro: Awake and Alert  Skin: pale  Resp: no distress  Heart: normal rate   Initiation of care has begun. The patient has been counseled on the process, plan, and necessity for staying for the completion/evaluation, and the remainder of the medical screening examination    Ashley Murrain, NP 09/18/18 1326    Margette Fast, MD 09/18/18 1827

## 2018-09-18 NOTE — ED Triage Notes (Signed)
Pt presents with 2 falls today.  Pt reports awakening this morning with weakness causing his falls.  EMS called to house, pt was pale and per paramedic, pt was leaning to R side.  Pt denies hitting his head, reports laying on floor x 30 minutes before calling out to wife.  Pt is on plavix.

## 2018-09-18 NOTE — ED Notes (Signed)
Patient verbalizes understanding of discharge instructions. Opportunity for questioning and answers were provided. Armband removed by staff, pt discharged from ED.  

## 2018-09-18 NOTE — ED Provider Notes (Signed)
[facility] Provider Note   CSN: [id number] Arrival date & time: 09/18/18  1225     History   Chief Complaint Chief Complaint  Patient presents with  . Fall    HPI [name] is a 72 y.o. male.  The history is provided by the patient, the spouse, medical records and a relative. No language interpreter was used.  Fall  This is a new problem. The current episode started 12 to 24 hours ago. The problem occurs constantly. The problem has not changed since onset.Associated symptoms include headaches. Pertinent negatives include no chest pain, no abdominal pain and no shortness of breath. Nothing aggravates the symptoms. Nothing relieves the symptoms. He has tried nothing for the symptoms. The treatment provided no relief.    Past Medical History:  Diagnosis Date  . ALLERGIC RHINITIS 11/06/2007  . Anemia, unspecified 08/22/2012  . BENIGN PROSTATIC HYPERTROPHY 07/14/2007  . COLONIC POLYPS, HX OF 07/14/2007  . Complication of anesthesia    pt states he needs to be cathed after surgery  . Degenerative arthritis of right knee 12/12/2011  . GERD 07/11/2007  . Headache(784.0) 07/11/2007  . HYPERLIPIDEMIA 07/11/2007  . HYPERTENSION 07/11/2007  . HYPERTHYROIDISM 07/14/2007  . HYPOTHYROIDISM 11/06/2007  . OSTEOARTHRITIS, KNEE, RIGHT 10/27/2008  . PERIPHERAL EDEMA 05/31/2009  . PULMONARY NODULE 06/24/2008   granuloma  . TIA (transient ischemic attack) 08/13/2015  . Type II or unspecified type diabetes mellitus without mention of complication, uncontrolled 08/22/2012   Patient states that he is not diabetic, not taking any medications  . Vitamin D deficiency 08/12/2015  . WOUND, LEG 05/31/2009    Patient Active Problem List   Diagnosis Date Noted  . Polyarthralgia 09/12/2017  . Chronic osteomyelitis of toe of left foot ([place]) 09/12/2017  . Skin lesion 02/19/2017  . Rotator cuff arthropathy 09/07/2016  . Recurrent falls 08/17/2016  . Right shoulder  pain 08/17/2016  . TIA (transient ischemic attack) 08/13/2015  . Hypercalcemia 08/12/2015  . Vitamin D deficiency 08/12/2015  . Former smoker 02/08/2015  . Spinal stenosis in cervical region 03/31/2013  . Disequilibrium 03/31/2013  . Lacunar infarction ([place]) 03/31/2013  . Abnormality of gait 03/31/2013  . Diabetes ([place]) 02/03/2013  . Spondylosis, cervical, with myelopathy 11/11/2012  . Anemia, unspecified 08/22/2012  . Degenerative arthritis of right knee 12/12/2011  . Preventative health care 12/10/2011  . PERIPHERAL EDEMA 05/31/2009  . OSTEOARTHRITIS, KNEE, RIGHT 10/27/2008  . PULMONARY NODULE 06/24/2008  . Hypothyroidism 11/06/2007  . ALLERGIC RHINITIS 11/06/2007  . BENIGN PROSTATIC HYPERTROPHY 07/14/2007  . COLONIC POLYPS, HX OF 07/14/2007  . Hyperlipidemia 07/11/2007  . Essential hypertension 07/11/2007  . GERD 07/11/2007    Past Surgical History:  Procedure Laterality Date  . ANTERIOR CERVICAL DECOMP/DISCECTOMY FUSION N/A 01/06/2013   Procedure: ANTERIOR CERVICAL DECOMPRESSION/DISCECTOMY FUSION 1 LEVEL;  Surgeon: Erline Levine, MD;  Location: [facility];  Service: Neurosurgery;  Laterality: N/A;  Cervical three-four Anterior cervical decompression/diskectomy/fusion  . BACK SURGERY  1986   x 2  1986 C 3  . HERNIA REPAIR  sept 1986  . left knee replacement  Nov 21, 2007  . NECK SURGERY    . NISSEN FUNDOPLICATION  0375  . right heel bone spur  1987  . TOTAL KNEE ARTHROPLASTY  01/09/2012   Procedure: TOTAL KNEE ARTHROPLASTY;  Surgeon: Tobi Bastos, MD;  Location: [facility];  Service: Orthopedics;  Laterality: Right;        Home Medications    Prior to  Admission medications   Medication Sig Start Date End Date Taking? Authorizing Provider  ACCU-CHEK AVIVA PLUS test strip USE TO CHECK BLOOD SUGARS 2 TIMES DAILY 07/25/18   Biagio Borg, MD  ACCU-CHEK SOFTCLIX LANCETS lancets USE TO CHECK BLOOD SUGARS  TWO TIMES DAILY 09/15/18   Biagio Borg, MD  amLODipine (NORVASC)  5 MG tablet Take 1 tablet (5 mg total) by mouth daily. 02/20/18   Biagio Borg, MD  aspirin EC 81 MG tablet Take 81 mg by mouth daily.    [provider]  atorvastatin (LIPITOR) 80 MG tablet Take 1 tablet (80 mg total) by mouth daily. 08/27/18   Biagio Borg, MD  Blood Glucose Monitoring Suppl (ACCU-CHEK AVIVA PLUS) w/Device KIT Use as directed 05/21/16   Biagio Borg, MD  cetirizine (ZYRTEC) 10 MG tablet Take 1 tablet (10 mg total) by mouth daily. 02/20/18   Biagio Borg, MD  clopidogrel (PLAVIX) 75 MG tablet Take 1 tablet (75 mg total) by mouth daily. 02/20/18   Biagio Borg, MD  Coenzyme Q10 (CO Q-10 MAXIMUM STRENGTH) 400 MG CAPS Take 1 capsule by mouth daily.    [provider]  fenofibrate 160 MG tablet Take 1 tablet (160 mg total) by mouth daily. 02/20/18   Biagio Borg, MD  gabapentin (NEURONTIN) 400 MG capsule TAKE 1 CAPSULE BY MOUTH AT  BEDTIME 07/25/18   Biagio Borg, MD  glucose blood (ONE TOUCH ULTRA TEST) test strip Use as instructed 02/08/15   Biagio Borg, MD  glucose blood test strip 1 each by Other route 2 (two) times daily. Use to check blood sugars twice a day Dx E11.9 03/14/15   Biagio Borg, MD  hydrochlorothiazide (HYDRODIURIL) 25 MG tablet Take 1 tablet (25 mg total) by mouth daily. 02/20/18   Biagio Borg, MD  labetalol (NORMODYNE) 300 MG tablet Take 1 tablet (300 mg total) by mouth 2 (two) times daily. 02/20/18   Biagio Borg, MD  levothyroxine (SYNTHROID, LEVOTHROID) 125 MCG tablet Take 1 tablet (125 mcg total) by mouth daily. 02/20/18   Biagio Borg, MD  metFORMIN (GLUCOPHAGE-XR) 500 MG 24 hr tablet Take 4 tablets (2,000 mg total) by mouth daily with breakfast. 08/27/18   Biagio Borg, MD  Omega-3 Fatty Acids (FISH OIL) 1000 MG CAPS Take 2 capsules by mouth daily.    [provider]  traMADol (ULTRAM) 50 MG tablet Take 1 tablet (50 mg total) by mouth every 12 (twelve) hours as needed. 09/12/17   Lyndal Pulley, DO    Family History Family  History  Problem Relation Age of Onset  . Heart attack Mother   . Heart attack Father   . Emphysema Father   . Heart disease Other   . Diabetes Neg Hx   . Thyroid disease Neg Hx     Social History Social History   Tobacco Use  . Smoking status: Former Smoker    Packs/day: 3.00    Years: 30.00    Pack years: 90.00    Types: Cigarettes    Last attempt to quit: 11/26/1982    Years since quitting: 35.8  . Smokeless tobacco: Never Used  Substance Use Topics  . Alcohol use: No  . Drug use: No     Allergies   Adhesive [tape]; Codeine; Naproxen; Prednisone; Zolpidem tartrate; and Penicillins   Review of Systems Review of Systems  Constitutional: Negative for chills, diaphoresis, fatigue and fever.  HENT: Negative for  congestion.   Eyes: Negative for visual disturbance.  Respiratory: Negative for cough, chest tightness and shortness of breath.   Cardiovascular: Negative for chest pain.  Gastrointestinal: Negative for abdominal pain, constipation, diarrhea, nausea and vomiting.  Genitourinary: Negative for flank pain.  Musculoskeletal: Positive for gait problem and neck pain. Negative for back pain and neck stiffness.  Skin: Negative for rash and wound.  Neurological: Positive for dizziness and headaches.  Psychiatric/Behavioral: Negative for agitation and confusion.  All other systems reviewed and are negative.    Physical Exam Updated Vital Signs BP 128/64 (BP Location: Right Arm)   Pulse 66   Temp 99.7 F (37.6 C) (Oral)   Resp 18   Ht 6' (1.829 m)   Wt 85.3 kg   SpO2 97%   BMI 25.50 kg/m   Physical Exam  Constitutional: He is oriented to person, place, and time. He appears well-developed and well-nourished. No distress.  HENT:  Head: Normocephalic and atraumatic.  Mouth/Throat: Oropharynx is clear and moist. No oropharyngeal exudate.  Eyes: Pupils are equal, round, and reactive to light. Conjunctivae are normal. Right eye exhibits nystagmus. Left eye  exhibits abnormal extraocular motion. Pupils are equal.    Neck: Normal range of motion.  Cardiovascular: Normal rate and intact distal pulses.  No murmur heard. Pulmonary/Chest: Effort normal. No respiratory distress. He has no wheezes. He exhibits no tenderness.  Abdominal: Soft. There is no tenderness. There is no rebound.  Musculoskeletal: Normal range of motion. He exhibits no edema or tenderness.  Neurological: He is alert and oriented to person, place, and time. He is not disoriented. He displays no tremor. A cranial nerve deficit (EOM abnormaltiies) is present. No sensory deficit. He exhibits normal muscle tone. He displays no seizure activity. Coordination and gait normal. GCS eye subscore is 4. GCS verbal subscore is 5. GCS motor subscore is 6.  Patient has slow gait but with assistance he did not fall.  Skin: Skin is warm. Capillary refill takes less than 2 seconds. He is not diaphoretic. No erythema. No pallor.  Psychiatric: He has a normal mood and affect.  Nursing note and vitals reviewed.    ED Treatments / Results  Labs (all labs ordered are listed, but only abnormal results are displayed) Labs Reviewed  BASIC METABOLIC PANEL - Abnormal; Notable for the following components:      Result Value   Potassium 3.2 (*)    Glucose, Bld 185 (*)    Calcium 10.8 (*)    GFR calc non Af Amer 56 (*)    All other components within normal limits  CBC - Abnormal; Notable for the following components:   WBC 16.4 (*)    All other components within normal limits  URINALYSIS, ROUTINE W REFLEX MICROSCOPIC - Abnormal; Notable for the following components:   Color, Urine AMBER (*)    All other components within normal limits  CBG MONITORING, ED - Abnormal; Notable for the following components:   Glucose-Capillary 204 (*)    All other components within normal limits  URINE CULTURE  HEPATIC FUNCTION PANEL  CBG MONITORING, ED  I-STAT TROPONIN, ED    EKG EKG  Interpretation  Date/Time:  Thursday September 18 2018 13:13:49 EDT Ventricular Rate:  69 PR Interval:  184 QRS Duration: 168 QT Interval:  494 QTC Calculation: 529 R Axis:   23 Text Interpretation:  Normal sinus rhythm Left bundle branch block Abnormal ECG When compared to prior, new LBBB. no STEMI Confirmed by Olinda Nola, Gerald Stabs [phone]) on  09/18/2018 3:28:54 PM   Radiology Dg Chest 2 View  Result Date: 09/18/2018 CLINICAL DATA:  Fever and coarse breath sounds.  Recent fall. EXAM: CHEST - 2 VIEW COMPARISON:  10/12/2015 and prior chest radiographs FINDINGS: The cardiomediastinal silhouette is unremarkable. There is no evidence of focal airspace disease, pulmonary edema, suspicious pulmonary nodule/mass, pleural effusion, or pneumothorax. No acute bony abnormalities are identified. IMPRESSION: No active cardiopulmonary disease. Electronically Signed   By: Margarette Canada M.D.   On: 09/18/2018 18:25   Ct Head Wo Contrast  Result Date: 09/18/2018 CLINICAL DATA:  Unsteady gait, recent falls, hitting the head EXAM: CT HEAD WITHOUT CONTRAST TECHNIQUE: Contiguous axial images were obtained from the base of the skull through the vertex without intravenous contrast. COMPARISON:  CT brain scan of 05/28/2015 FINDINGS: Brain: The ventricular system remains prominent as are the cortical sulci, consistent with diffuse atrophy. The septum is midline in position. The fourth ventricle and basilar cisterns are unremarkable. There is mild small vessel ischemic change in the periventricular white matter. No hemorrhage, mass lesion, or acute infarction is seen. Vascular: No vascular abnormality is noted on this unenhanced study. Skull: No acute calvarial abnormality is noted. Sinuses/Orbits: There is evidence of chronic sinusitis with mucosal thickening diffusely throughout the ethmoid air cells with some involvement of the left partition of the sphenoid sinus right frontal sinus, and the maxillary sinuses. No air-fluid level  is seen. Other: None IMPRESSION: 1. Diffuse atrophy and moderate small vessel ischemic change. No acute intracranial abnormality. 2. Diffuse paranasal sinus disease. Electronically Signed   By: Ivar Drape M.D.   On: 09/18/2018 14:22   Ct Cervical Spine Wo Contrast  Result Date: 09/18/2018 CLINICAL DATA:  72 y/o  M; 2 falls today. EXAM: CT CERVICAL SPINE WITHOUT CONTRAST TECHNIQUE: Multidetector CT imaging of the cervical spine was performed without intravenous contrast. Multiplanar CT image reconstructions were also generated. COMPARISON:  09/18/2018 CT of the head. FINDINGS: Alignment: Straightening of cervical lordosis. C4-5 grade 1 anterolisthesis. Skull base and vertebrae: C3-4 ACDF. Hardware is intact, no periprosthetic lucency. Bilateral C3-4 facet fusion on degenerative basis. No acute fracture or dislocation identified. Soft tissues and spinal canal: No prevertebral fluid or swelling. No visible canal hematoma. Disc levels: Advanced cervical spondylosis with multilevel discogenic degenerative changes and prominent upper cervical facet arthrosis greater on the left. Uncovertebral and facet hypertrophy results in right-sided C4-5, bilateral C5-6, left C6-7, and bilateral C7-T1 foraminal stenosis. No high-grade bony canal stenosis. Upper chest: 8 x 12 mm nodule in the right upper lobe (series 8, image 98). Biapical scarring and emphysema. Other: Calcific atherosclerosis of aorta and carotid bifurcations. IMPRESSION: 1. No acute fracture or dislocation identified. 2. C3-4 ACDF without apparent hardware complication. 3. Advanced cervical spondylosis. 4. 8 x 12 mm, mean 10 mm, right upper lobe pulmonary nodule. Consider one of the following in 3 months for both low-risk and high-risk individuals: (a) repeat chest CT, (b) follow-up PET-CT, or (c) tissue sampling. This recommendation follows the consensus statement: Guidelines for Management of Incidental Pulmonary Nodules Detected on CT Images: From the  Fleischner Society 2017; Radiology 2017; [id number]:228-243. 5. Aortic Atherosclerosis (ICD10-I70.0) and Emphysema (ICD10-J43.9). Electronically Signed   By: Kristine Garbe M.D.   On: 09/18/2018 18:33   Mr Brain Wo Contrast  Result Date: 09/18/2018 CLINICAL DATA:  Multiple falls today, leaning to RIGHT. No head injury. On Plavix. History of hypertension, hyperlipidemia and diabetes. EXAM: MRI HEAD WITHOUT CONTRAST TECHNIQUE: Multiplanar, multiecho pulse sequences of the brain and surrounding  structures were obtained without intravenous contrast. COMPARISON:  CT HEAD September 18, 2018. FINDINGS: Mild motion degraded examination. INTRACRANIAL CONTENTS: No reduced diffusion to suggest acute ischemia. No susceptibility artifact to suggest hemorrhage. The ventricles and sulci are normal for patient's age. A few subcentimeter supratentorial white matter FLAIR T2 hyperintensities compatible with mild chronic small vessel ischemic changes, less than expected for age. No suspicious parenchymal signal, masses, mass effect. No abnormal extra-axial fluid collections. No extra-axial masses. VASCULAR: Normal major intracranial vascular flow voids present at skull base. SKULL AND UPPER CERVICAL SPINE: No abnormal sellar expansion. No suspicious calvarial bone marrow signal. Craniocervical junction maintained. SINUSES/ORBITS: Mild paranasal sinus mucosal thickening. Mastoid air cells are well aerated.The included ocular globes and orbital contents are non-suspicious. OTHER: None. IMPRESSION: 1. Mild motion degraded examination; negative noncontrast MRI head for age. Electronically Signed   By: Elon Alas M.D.   On: 09/18/2018 23:11    Procedures Procedures (including critical care time)  Medications Ordered in ED Medications  alum & mag hydroxide-simeth (MAALOX/MYLANTA) 200-200-20 MG/5ML suspension 30 mL (30 mLs Oral Given 09/18/18 1622)    And  lidocaine (XYLOCAINE) 2 % viscous mouth solution 15 mL (15 mLs  Oral Given 09/18/18 1622)  alum & mag hydroxide-simeth (MAALOX/MYLANTA) 200-200-20 MG/5ML suspension 30 mL (30 mLs Oral Given 09/18/18 2143)    And  lidocaine (XYLOCAINE) 2 % viscous mouth solution 15 mL (15 mLs Oral Given 09/18/18 2143)     Initial Impression / Assessment and Plan / ED Course  I have reviewed the triage vital signs and the nursing notes.  Pertinent labs & imaging results that were available during my care of the patient were reviewed by me and considered in my medical decision making (see chart for details).     LAQUAN BEIER is a 72 y.o. male with a past medical history significant for prior stroke with left-sided weakness on aspirin and Plavix, recurrent TIAs, hypertension, hyperlipidemia, hypothyroidism, and diabetes who presents with 1 day of gait ab normality and multiple falls.  Patient reports that he woke up this morning and while walking to the bathroom was extremely unsteady.  Last normal was going to bed last night at 3 AM.  He reports that he kept falling to the right after using the bathroom.  He reports that he was on the ground for approximately 30 minutes before his wife called EMS and the fire department helped get him up.  He reports he was then able to sit in a chair for a while.  He then tried to have a 2 different chair and had another fall onto hardwood.  He reports having onset of pain in his neck and head after the first fall.  He reports some nausea but no vomiting.  He reports that he chronically has weakness on the left side but today is concerned about some right leg weakness.  He reports some chronic indigestion but reports no significant changes from baseline.  He has a cough with occasional sputum production.  No fevers or chills.  No urinary symptoms.  No conservation or diarrhea.  No chest pain or palpitations.  He denies any vision changes.  On exam, patient has had normality with extraocular movements.  Patient's left eye is unable to look all  the way to the right causing nystagmus in the right eye.  Pupils are reactive and symmetric.  Patient reports this eye movement abnormality is new.  Patient had ataxia with left arm finger-nose finger testing.  He  reports this is at his baseline.  He has normal sensation in face.  No facial droop.  Patient has equal sensation and strength in the legs.  Subjectively patient feels his right leg is weaker than baseline.  Slight decrease in grip strength in left compared to right which he reports is at baseline.  Normal sensation in the arms.  Neck was nontender.  Back is nontender.  Abdomen and chest are nontender.  No murmur.  Lungs clear.  Clinically I am concerned patient may have had another stroke given his report that unsteadiness was present with prior stroke.  Patient will have imaging with CT of the head and neck to look for injuries from the fall and will also screen laboratory testing.  Patient will work-up to look for occult infection as his initial blood work shows a leukocytosis.  I anticipate patient will likely need MRI and neurology evaluation given his extraocular movement abnormality, history of stroke, and reports that his gait abnormality is new and persistent since yesterday.  Anticipate patient will likely need admission for stroke work-up and to have PT/OT see him given his inability to safely ablate without falling.  12:56 AM MRI showed no acute abnormality in the brain.  After MRI was reassuring and CT imaging showed no fracture and his work-up was otherwise reassuring, patient attempted ambulation.  Patient was able to ambulate with assistance.  Due to patient likely requiring observation admission if he was admitted, family and patient do not want to stay.  They will follow-up with PCP and discuss outpatient PT/OT evaluation.  He will use his walker and be careful not to fall.  He will maintain hydration.  Given patient's improvement in gait and reassuring work-up, patient will  be discharged home.  Patient discharged in good condition after shared decision-making conversation with patient and family about admission versus discharge and the decision made for discharge.  Final Clinical Impressions(s) / ED Diagnoses   Final diagnoses:  Fall, initial encounter  Acute nonintractable headache, unspecified headache type    ED Discharge Orders    None      Clinical Impression: 1. Fall, initial encounter   2. Acute nonintractable headache, unspecified headache type     Disposition: Discharge  Condition: Good  I have discussed the results, Dx and Tx plan with the pt(& family if present). He/she/they expressed understanding and agree(s) with the plan. Discharge instructions discussed at great length. Strict return precautions discussed and pt &/or family have verbalized understanding of the instructions. No further questions at time of discharge.    Discharge Medication List as of 09/18/2018 11:25 PM      Follow Up: Biagio Borg, MD [place] [id number] [phone]     [place] [address] [id number] mc [place]       Shia Delaine, Gwenyth Allegra, MD 09/19/18 [phone]

## 2018-09-18 NOTE — ED Notes (Signed)
Informed pt of need for urine sample. Pt stated he was unable to provide one at this time.

## 2018-09-18 NOTE — ED Notes (Signed)
Patient transported to MRI 

## 2018-09-18 NOTE — ED Notes (Signed)
Patient transported to CT 

## 2018-09-19 ENCOUNTER — Telehealth: Payer: Self-pay | Admitting: Internal Medicine

## 2018-09-19 NOTE — Telephone Encounter (Signed)
Copied from [place] [id number]. Topic: General - Other >> Sep 19, 2018  9:54 AM Yvette Rack wrote: Reason for CRM: pt went to hospital for 2 falls yesterday morning the Doctor at the hospital wanted Dr Jenny Reichmann to know the pt went to hospital and to look at report if pt may need OT or PT for the falls

## 2018-09-20 LAB — URINE CULTURE: Culture: <10000 — AB

## 2018-09-22 NOTE — Telephone Encounter (Signed)
I called wife in regard.  Phone call dropped after I told wife that patient needed to come in for OV.  We can schedule patient with Jodi Mourning this week or I can work patient in with Dr. Jenny Reichmann the following week.

## 2018-09-22 NOTE — Telephone Encounter (Signed)
Please make ROV for recurent falls and possible need for PT, OT. thanks

## 2018-09-23 NOTE — Telephone Encounter (Signed)
Noted  

## 2018-09-23 NOTE — Telephone Encounter (Signed)
Scheduled pt for 11/1 with Jodi Mourning.

## 2018-09-23 NOTE — Telephone Encounter (Signed)
I have tried to call spouse again.  Phone rings and VM is not set up.  Tried patient's phone.  Number is not working.

## 2018-09-26 ENCOUNTER — Ambulatory Visit: Payer: Medicare Other | Admitting: Family

## 2018-09-26 ENCOUNTER — Other Ambulatory Visit (INDEPENDENT_AMBULATORY_CARE_PROVIDER_SITE_OTHER): Payer: Medicare Other

## 2018-09-26 ENCOUNTER — Encounter: Payer: Self-pay | Admitting: Family

## 2018-09-26 VITALS — BP 126/80 | HR 59 | Temp 97.9°F | Ht 72.0 in | Wt 189.1 lb

## 2018-09-26 DIAGNOSIS — R296 Repeated falls: Secondary | ICD-10-CM

## 2018-09-26 LAB — COMPREHENSIVE METABOLIC PANEL
ALK PHOS: 77 U/L (ref 39–117)
ALT: 49 U/L (ref 0–53)
AST: 33 U/L (ref 0–37)
Albumin: 4.7 g/dL (ref 3.5–5.2)
BUN: 21 mg/dL (ref 6–23)
CALCIUM: 10.6 mg/dL — AB (ref 8.4–10.5)
CO2: 26 meq/L (ref 19–32)
Chloride: 102 mEq/L (ref 96–112)
Creatinine, Ser: 1.25 mg/dL (ref 0.40–1.50)
GFR: 59.91 mL/min — AB (ref >60.00)
GLUCOSE: 130 mg/dL — AB (ref 70–99)
POTASSIUM: 4.4 meq/L (ref 3.5–5.1)
Sodium: 141 mEq/L (ref 135–145)
TOTAL PROTEIN: 7.9 g/dL (ref 6.0–8.3)
Total Bilirubin: 0.5 mg/dL (ref 0.2–1.2)

## 2018-09-26 NOTE — Progress Notes (Signed)
[name] is a 72 y.o. male with the following history as recorded in [employer]:  Patient Active Problem List   Diagnosis Date Noted  . Polyarthralgia 09/12/2017  . Chronic osteomyelitis of toe of left foot ([place]) 09/12/2017  . Skin lesion 02/19/2017  . Rotator cuff arthropathy 09/07/2016  . Recurrent falls 08/17/2016  . Right shoulder pain 08/17/2016  . TIA (transient ischemic attack) 08/13/2015  . Hypercalcemia 08/12/2015  . Vitamin D deficiency 08/12/2015  . Former smoker 02/08/2015  . Spinal stenosis in cervical region 03/31/2013  . Disequilibrium 03/31/2013  . Lacunar infarction ([place]) 03/31/2013  . Abnormality of gait 03/31/2013  . Diabetes ([place]) 02/03/2013  . Spondylosis, cervical, with myelopathy 11/11/2012  . Anemia, unspecified 08/22/2012  . Degenerative arthritis of right knee 12/12/2011  . Preventative health care 12/10/2011  . PERIPHERAL EDEMA 05/31/2009  . OSTEOARTHRITIS, KNEE, RIGHT 10/27/2008  . PULMONARY NODULE 06/24/2008  . Hypothyroidism 11/06/2007  . ALLERGIC RHINITIS 11/06/2007  . BENIGN PROSTATIC HYPERTROPHY 07/14/2007  . COLONIC POLYPS, HX OF 07/14/2007  . Hyperlipidemia 07/11/2007  . Essential hypertension 07/11/2007  . GERD 07/11/2007    Current Outpatient Medications  Medication Sig Dispense Refill  . ACCU-CHEK AVIVA PLUS test strip USE TO CHECK BLOOD SUGARS 2 TIMES DAILY (Patient taking differently: 1 each by Other route 2 (two) times daily. ) 100 each 2  . ACCU-CHEK SOFTCLIX LANCETS lancets USE TO CHECK BLOOD SUGARS  TWO TIMES DAILY (Patient taking differently: 1 each 2 (two) times daily. ) 200 each 11  . amLODipine (NORVASC) 5 MG tablet Take 1 tablet (5 mg total) by mouth daily. 90 tablet 3  . aspirin EC 325 MG tablet Take 325 mg by mouth once as needed (when stroke-like symptoms present).     Marland Kitchen aspirin EC 81 MG tablet Take 81 mg by mouth daily.    Marland Kitchen atorvastatin (LIPITOR) 80 MG tablet Take 1 tablet (80 mg total) by mouth daily. (Patient  taking differently: Take 80 mg by mouth at bedtime. ) 90 tablet 3  . Blood Glucose Monitoring Suppl (ACCU-CHEK AVIVA PLUS) w/Device KIT Use as directed 1 kit 0  . cetirizine (ZYRTEC) 10 MG tablet Take 1 tablet (10 mg total) by mouth daily. 90 tablet 3  . clopidogrel (PLAVIX) 75 MG tablet Take 1 tablet (75 mg total) by mouth daily. 90 tablet 3  . Coenzyme Q10 (CO Q-10 MAXIMUM STRENGTH) 400 MG CAPS Take 400 mg by mouth daily.     . fenofibrate 160 MG tablet Take 1 tablet (160 mg total) by mouth daily. 90 tablet 3  . gabapentin (NEURONTIN) 400 MG capsule TAKE 1 CAPSULE BY MOUTH AT  BEDTIME (Patient taking differently: Take 400 mg by mouth at bedtime. ) 90 capsule 1  . glucose blood (ONE TOUCH ULTRA TEST) test strip Use as instructed 100 each 12  . glucose blood test strip 1 each by Other route 2 (two) times daily. Use to check blood sugars twice a day Dx E11.9 300 each 2  . hydrochlorothiazide (HYDRODIURIL) 25 MG tablet Take 1 tablet (25 mg total) by mouth daily. 90 tablet 3  . ibuprofen (ADVIL,MOTRIN) 200 MG tablet Take 400 mg by mouth every 6 (six) hours as needed (FOR PAIN).    Marland Kitchen labetalol (NORMODYNE) 300 MG tablet Take 1 tablet (300 mg total) by mouth 2 (two) times daily. 180 tablet 3  . levothyroxine (SYNTHROID, LEVOTHROID) 125 MCG tablet Take 1 tablet (125 mcg total) by mouth daily. 90 tablet 3  . metFORMIN (  GLUCOPHAGE-XR) 500 MG 24 hr tablet Take 4 tablets (2,000 mg total) by mouth daily with breakfast. (Patient taking differently: Take 1,500 mg by mouth daily with breakfast. ) 360 tablet 3  . Omega-3 Fatty Acids (FISH OIL) 1000 MG CAPS Take 2,000 mg by mouth daily.     . traMADol (ULTRAM) 50 MG tablet Take 1 tablet (50 mg total) by mouth every 12 (twelve) hours as needed. 60 tablet 0   No current facility-administered medications for this visit.     Allergies: Codeine; Metformin and related; Naproxen; Prednisone; Zolpidem tartrate; Adhesive [tape]; and Penicillins  Past Medical History:   Diagnosis Date  . ALLERGIC RHINITIS 11/06/2007  . Anemia, unspecified 08/22/2012  . BENIGN PROSTATIC HYPERTROPHY 07/14/2007  . COLONIC POLYPS, HX OF 07/14/2007  . Complication of anesthesia    pt states he needs to be cathed after surgery  . Degenerative arthritis of right knee 12/12/2011  . GERD 07/11/2007  . Headache(784.0) 07/11/2007  . HYPERLIPIDEMIA 07/11/2007  . HYPERTENSION 07/11/2007  . HYPERTHYROIDISM 07/14/2007  . HYPOTHYROIDISM 11/06/2007  . OSTEOARTHRITIS, KNEE, RIGHT 10/27/2008  . PERIPHERAL EDEMA 05/31/2009  . PULMONARY NODULE 06/24/2008   granuloma  . TIA (transient ischemic attack) 08/13/2015  . Type II or unspecified type diabetes mellitus without mention of complication, uncontrolled 08/22/2012   Patient states that he is not diabetic, not taking any medications  . Vitamin D deficiency 08/12/2015  . WOUND, LEG 05/31/2009    Past Surgical History:  Procedure Laterality Date  . ANTERIOR CERVICAL DECOMP/DISCECTOMY FUSION N/A 01/06/2013   Procedure: ANTERIOR CERVICAL DECOMPRESSION/DISCECTOMY FUSION 1 LEVEL;  Surgeon: Erline Levine, MD;  Location: [facility];  Service: Neurosurgery;  Laterality: N/A;  Cervical three-four Anterior cervical decompression/diskectomy/fusion  . BACK SURGERY  1986   x 2  1986 C 3  . HERNIA REPAIR  sept 1986  . left knee replacement  Nov 21, 2007  . NECK SURGERY    . NISSEN FUNDOPLICATION  1607  . right heel bone spur  1987  . TOTAL KNEE ARTHROPLASTY  01/09/2012   Procedure: TOTAL KNEE ARTHROPLASTY;  Surgeon: Tobi Bastos, MD;  Location: [facility];  Service: Orthopedics;  Laterality: Right;    Family History  Problem Relation Age of Onset  . Heart attack Mother   . Heart attack Father   . Emphysema Father   . Heart disease Other   . Diabetes Neg Hx   . Thyroid disease Neg Hx     Social History   Tobacco Use  . Smoking status: Former Smoker    Packs/day: 3.00    Years: 30.00    Pack years: 90.00    Types: Cigarettes    Last attempt to  quit: 11/26/1982    Years since quitting: 35.8  . Smokeless tobacco: Never Used  Substance Use Topics  . Alcohol use: No    Subjective:  Patient presents with his wife today; concerns about chronic history of falls; symptoms have been "on and off" x 5 years; went to ER with 2 falls on 09/18/2018; was recommended to follow-up to discuss PT/ OT; per patient, he has had problems with his balance "for years." Has seen neurologist and balance therapy with no benefit; patient's PCP recommended discussing starting PT/OT but patient is declining; patient's wife notes that with every fall, she has noticed he will almost immediately become "white as a ghost." Patient's wife notes that after he falls, she will usually give him a 325 mg aspirin and his "color" normalizes almost  immediately;    Objective:  Vitals:   09/26/18 1326  BP: 126/80  Pulse: (!) 59  Temp: 97.9 F (36.6 C)  TempSrc: Oral  SpO2: 97%  Weight: 189 lb 1.9 oz (85.8 kg)  Height: 6' (1.829 m)    General: Well developed, well nourished, in no acute distress  Skin : Warm and dry.  Head: Normocephalic and atraumatic  Lungs: Respirations unlabored; clear to auscultation bilaterally without wheeze, rales, rhonchi  CVS exam: normal rate and regular rhythm, no JVD.  Musculoskeletal: No deformities; no active joint inflammation  Extremities: No edema, cyanosis, clubbing  Vessels: Symmetric bilaterally  Neurologic: Alert and oriented; speech intact; face symmetrical; uses cane for stability; CNII-XII intact without focal deficit   Assessment:  1. Frequent falls   2. Hypercalcemia     Plan:  1. Per patient and wife, these symptoms have been occurring x 5 years; seemed to start after stroke and left with chronic balance issues; has seen neurology and ENT with no benefit; does not want to go to PT/ OT as recommended by his PCP; will consider cardiology even though unlikely source to rule out any abnormal heart rhythm; he will call back if  he wants to proceed; he does agree to use his walker or cane; 2. Update CMP and PTH today;   Spent 30 minutes with patient; greater than 50% spent in counseling to discuss treatment options for falls;    No follow-ups on file.  Orders Placed This Encounter  Procedures  . Comp Met (CMET)    Standing Status:   Future    Number of Occurrences:   1    Standing Expiration Date:   09/26/2019  . PTH, Intact and Calcium    Standing Status:   Future    Number of Occurrences:   1    Standing Expiration Date:   09/26/2019    Requested Prescriptions    No prescriptions requested or ordered in this encounter

## 2018-09-30 LAB — PTH, INTACT AND CALCIUM
CALCIUM: 10.6 mg/dL — AB (ref 8.6–10.3)
PTH: 6 pg/mL — ABNORMAL LOW (ref 14–64)

## 2018-10-03 ENCOUNTER — Other Ambulatory Visit: Payer: Self-pay | Admitting: Family

## 2018-10-03 DIAGNOSIS — R7989 Other specified abnormal findings of blood chemistry: Secondary | ICD-10-CM

## 2018-10-20 ENCOUNTER — Other Ambulatory Visit: Payer: Self-pay | Admitting: Internal Medicine

## 2018-11-07 ENCOUNTER — Ambulatory Visit: Payer: Medicare Other | Admitting: Endocrinology

## 2018-11-07 ENCOUNTER — Encounter: Payer: Self-pay | Admitting: Endocrinology

## 2018-11-07 MED ORDER — HYDROCHLOROTHIAZIDE 25 MG PO TABS: 12.5000 mg | ORAL_TABLET | Freq: Every day | ORAL | 3 refills | Status: DC

## 2018-11-07 NOTE — Patient Instructions (Signed)
Please reduce the HCTZ to 1/2 pill per day. Please come back for a follow-up appointment in 1 month.  Then we'll recheck the calcium, and some other blood tests looking for other causes of it being high.

## 2018-11-07 NOTE — Progress Notes (Signed)
Subjective:    Patient ID: [name], male    DOB: [birth year], 72 y.o.   MRN: [id number]  HPI Pt is referred by Dr Jenny Reichmann, for hypercalcemia.  Pt was noted to have hypercalcemia in 2018 (it was normal in 2017). He has never had osteoporosis, urolithiasis, sarcoidosis, cancer, PUD, pancreatitis, depression, or bony fracture.  He does not take A supplement.  Pt denies taking antacids or Li++.   He take vit-D3, 1000 units/day, and tums, PRN (1/day).  He has slight unsteadiness of the gait, but no assoc LOC.   Past Medical History:  Diagnosis Date  . ALLERGIC RHINITIS 11/06/2007  . Anemia, unspecified 08/22/2012  . BENIGN PROSTATIC HYPERTROPHY 07/14/2007  . COLONIC POLYPS, HX OF 07/14/2007  . Complication of anesthesia    pt states he needs to be cathed after surgery  . Degenerative arthritis of right knee 12/12/2011  . GERD 07/11/2007  . Headache(784.0) 07/11/2007  . HYPERLIPIDEMIA 07/11/2007  . HYPERTENSION 07/11/2007  . HYPERTHYROIDISM 07/14/2007  . HYPOTHYROIDISM 11/06/2007  . OSTEOARTHRITIS, KNEE, RIGHT 10/27/2008  . PERIPHERAL EDEMA 05/31/2009  . PULMONARY NODULE 06/24/2008   granuloma  . TIA (transient ischemic attack) 08/13/2015  . Type II or unspecified type diabetes mellitus without mention of complication, uncontrolled 08/22/2012   Patient states that he is not diabetic, not taking any medications  . Vitamin D deficiency 08/12/2015  . WOUND, LEG 05/31/2009    Past Surgical History:  Procedure Laterality Date  . ANTERIOR CERVICAL DECOMP/DISCECTOMY FUSION N/A 01/06/2013   Procedure: ANTERIOR CERVICAL DECOMPRESSION/DISCECTOMY FUSION 1 LEVEL;  Surgeon: Erline Levine, MD;  Location: [facility];  Service: Neurosurgery;  Laterality: N/A;  Cervical three-four Anterior cervical decompression/diskectomy/fusion  . BACK SURGERY  1986   x 2  1986 C 3  . HERNIA REPAIR  sept 1986  . left knee replacement  Nov 21, 2007  . NECK SURGERY    . NISSEN FUNDOPLICATION  8638  . right heel bone spur  1987   . TOTAL KNEE ARTHROPLASTY  01/09/2012   Procedure: TOTAL KNEE ARTHROPLASTY;  Surgeon: Tobi Bastos, MD;  Location: [facility];  Service: Orthopedics;  Laterality: Right;    Social History   Socioeconomic History  . Marital status: Married    Spouse name: [name]  . Number of children: 2  . Years of education: HS  . Highest education level: Not on file  Occupational History  . Occupation: Retired  Scientific laboratory technician  . Financial resource strain: Not on file  . Food insecurity:    Worry: Not on file    Inability: Not on file  . Transportation needs:    Medical: Not on file    Non-medical: Not on file  Tobacco Use  . Smoking status: Former Smoker    Packs/day: 3.00    Years: 30.00    Pack years: 90.00    Types: Cigarettes    Last attempt to quit: 11/26/1982    Years since quitting: 35.9  . Smokeless tobacco: Never Used  Substance and Sexual Activity  . Alcohol use: No  . Drug use: No  . Sexual activity: Not on file  Lifestyle  . Physical activity:    Days per week: Not on file    Minutes per session: Not on file  . Stress: Not on file  Relationships  . Social connections:    Talks on phone: Not on file    Gets together: Not on file    Attends religious service: Not on file  Active member of club or organization: Not on file    Attends meetings of clubs or organizations: Not on file    Relationship status: Not on file  . Intimate partner violence:    Fear of current or ex partner: Not on file    Emotionally abused: Not on file    Physically abused: Not on file    Forced sexual activity: Not on file  Other Topics Concern  . Not on file  Social History Narrative   Patient lives at home with spouse.   Caffiene Use: 4 cups daily    Current Outpatient Medications on File Prior to Visit  Medication Sig Dispense Refill  . ACCU-CHEK AVIVA PLUS test strip USE TO CHECK BLOOD SUGARS 2 TIMES DAILY 100 each 2  . ACCU-CHEK SOFTCLIX LANCETS lancets USE TO CHECK BLOOD SUGARS   TWO TIMES DAILY (Patient taking differently: 1 each 2 (two) times daily. ) 200 each 11  . amLODipine (NORVASC) 5 MG tablet Take 1 tablet (5 mg total) by mouth daily. 90 tablet 3  . aspirin EC 325 MG tablet Take 325 mg by mouth once as needed (when stroke-like symptoms present).     Marland Kitchen aspirin EC 81 MG tablet Take 81 mg by mouth daily.    Marland Kitchen atorvastatin (LIPITOR) 80 MG tablet Take 1 tablet (80 mg total) by mouth daily. (Patient taking differently: Take 80 mg by mouth at bedtime. ) 90 tablet 3  . Blood Glucose Monitoring Suppl (ACCU-CHEK AVIVA PLUS) w/Device KIT Use as directed 1 kit 0  . cetirizine (ZYRTEC) 10 MG tablet Take 1 tablet (10 mg total) by mouth daily. 90 tablet 3  . clopidogrel (PLAVIX) 75 MG tablet Take 1 tablet (75 mg total) by mouth daily. 90 tablet 3  . Coenzyme Q10 (CO Q-10 MAXIMUM STRENGTH) 400 MG CAPS Take 400 mg by mouth daily.     . fenofibrate 160 MG tablet Take 1 tablet (160 mg total) by mouth daily. 90 tablet 3  . gabapentin (NEURONTIN) 400 MG capsule TAKE 1 CAPSULE BY MOUTH AT  BEDTIME (Patient taking differently: Take 400 mg by mouth at bedtime. ) 90 capsule 1  . glucose blood (ONE TOUCH ULTRA TEST) test strip Use as instructed 100 each 12  . glucose blood test strip 1 each by Other route 2 (two) times daily. Use to check blood sugars twice a day Dx E11.9 300 each 2  . ibuprofen (ADVIL,MOTRIN) 200 MG tablet Take 400 mg by mouth every 6 (six) hours as needed (FOR PAIN).    Marland Kitchen labetalol (NORMODYNE) 300 MG tablet Take 1 tablet (300 mg total) by mouth 2 (two) times daily. 180 tablet 3  . levothyroxine (SYNTHROID, LEVOTHROID) 125 MCG tablet Take 1 tablet (125 mcg total) by mouth daily. 90 tablet 3  . metFORMIN (GLUCOPHAGE-XR) 500 MG 24 hr tablet Take 4 tablets (2,000 mg total) by mouth daily with breakfast. (Patient taking differently: Take 1,500 mg by mouth daily with breakfast. ) 360 tablet 3  . Omega-3 Fatty Acids (FISH OIL) 1000 MG CAPS Take 2,000 mg by mouth daily.     .  Potassium 99 MG TABS Take 1 tablet by mouth daily.    . traMADol (ULTRAM) 50 MG tablet Take 1 tablet (50 mg total) by mouth every 12 (twelve) hours as needed. 60 tablet 0   No current facility-administered medications on file prior to visit.     Allergies  Allergen Reactions  . Codeine Other (See Comments)    Headaches   .  Metformin And Related Other (See Comments)    Cannot tolerate in higher doses- has to drop from 2,000 mg daily to 1,500 mg daily to STOP the diarrhea that was occurring  . Naproxen Diarrhea  . Prednisone Diarrhea  . Zolpidem Tartrate Other (See Comments)    Hallucinations   . Adhesive [Tape] Rash and Other (See Comments)    Redness, also (thinks he can tolerate paper tape)  . Penicillins Rash and Other (See Comments)    Welts Has patient had a PCN reaction causing immediate rash, facial/tongue/throat swelling, SOB or lightheadedness with hypotension: Yes Has patient had a PCN reaction causing severe rash involving mucus membranes or skin necrosis: No Has patient had a PCN reaction that required hospitalization: No Has patient had a PCN reaction occurring within the last 10 years: No If all of the above answers are "NO", then may proceed with Cephalosporin use.     Family History  Problem Relation Age of Onset  . Heart attack Mother   . Heart attack Father   . Emphysema Father   . Heart disease Other   . Diabetes Neg Hx   . Thyroid disease Neg Hx   . Hypercalcemia Neg Hx     BP 138/70 (BP Location: Left Arm, Patient Position: Sitting, Cuff Size: Normal)   Pulse 70   Ht 6' (1.829 m)   Wt 185 lb 3.2 oz (84 kg)   SpO2 97%   BMI 25.12 kg/m    Review of Systems Denies visual loss, cough, edema, diarrhea, sore throat, polyuria, hematuria, rash, depression, and numbness.  He has lost a 30 lbs, x 2 yearss (intentional).  He has chronic low-back pain and cough.       Objective:   Physical Exam VS: see vs page GEN: no distress HEAD: head: no  deformity eyes: no periorbital swelling, no proptosis external nose and ears are normal mouth: no lesion seen NECK: supple, thyroid is not enlarged CHEST WALL: no deformity LUNGS: clear to auscultation CV: reg rate and rhythm, no murmur ABD: abdomen is soft, nontender.  no hepatosplenomegaly.  not distended.  no hernia MUSCULOSKELETAL: muscle bulk and strength are grossly normal.  no obvious joint swelling.  gait is slow but steady, with a cane EXTEMITIES: no deformity.  no edema PULSES: no carotid bruit NEURO:  cn 2-12 grossly intact.   readily moves all 4's.  sensation is intact to touch on all 4's SKIN:  Normal texture and temperature.  No rash or suspicious lesion is visible.   NODES:  None palpable at the neck PSYCH: alert, well-oriented.  Does not appear anxious nor depressed.    Lab Results  Component Value Date   PTH 6 (L) 09/26/2018   CALCIUM 10.6 (H) 09/26/2018   CALCIUM 10.6 (H) 09/26/2018   CAION 5.7 (H) 09/12/2017   PHOS 3.5 10/07/2015   PTHrP=9  I have reviewed outside records, and summarized: Pt was noted to have elevated Ca++, and referred here.  Other proba addressed were wellness, HTN, DM, and dyslipidemia     Assessment & Plan:  Hypercalcemia, new to me.  Most likely due to HCTZ  Patient Instructions  Please reduce the HCTZ to 1/2 pill per day. Please come back for a follow-up appointment in 1 month.  Then we'll recheck the calcium, and some other blood tests looking for other causes of it being high.

## 2018-12-08 ENCOUNTER — Encounter: Payer: Self-pay | Admitting: Endocrinology

## 2018-12-08 ENCOUNTER — Ambulatory Visit: Payer: Medicare Other | Admitting: Endocrinology

## 2018-12-08 LAB — BASIC METABOLIC PANEL
BUN: 14 mg/dL (ref 6–23)
CO2: 25 mEq/L (ref 19–32)
Calcium: 10.6 mg/dL — ABNORMAL HIGH (ref 8.4–10.5)
Chloride: 104 mEq/L (ref 96–112)
Creatinine, Ser: 1.26 mg/dL (ref 0.40–1.50)
GFR: 59.33 mL/min — AB (ref >60.00)
Glucose, Bld: 165 mg/dL — ABNORMAL HIGH (ref 70–99)
POTASSIUM: 3.8 meq/L (ref 3.5–5.1)
SODIUM: 140 meq/L (ref 135–145)

## 2018-12-08 LAB — VITAMIN D 25 HYDROXY (VIT D DEFICIENCY, FRACTURES): VITD: 99.37 ng/mL (ref 30.00–100.00)

## 2018-12-08 NOTE — Patient Instructions (Addendum)
Please do blood tests today. Please come back for a follow-up appointment in 1 month.  our goals are normal calcium and vitamin-D levels.

## 2018-12-08 NOTE — Progress Notes (Signed)
Subjective:    Patient ID: [name], male    DOB: [birth year], 73 y.o.   MRN: [id number]  HPI Pt returns for f/u of hypercalcemia (dx'ed 2018 (it was normal in 2017); only cause found was HCTZ; 24-HR urine Ca++ was 336 in 2016; PTH and PTHrP were low; he takes vit-D3, 1000 units/day, and tums, PRN (1/day)).  He takes non-rx vit-D, 2000 units per day.  He has occ falls.   Past Medical History:  Diagnosis Date  . ALLERGIC RHINITIS 11/06/2007  . Anemia, unspecified 08/22/2012  . BENIGN PROSTATIC HYPERTROPHY 07/14/2007  . COLONIC POLYPS, HX OF 07/14/2007  . Complication of anesthesia    pt states he needs to be cathed after surgery  . Degenerative arthritis of right knee 12/12/2011  . GERD 07/11/2007  . Headache(784.0) 07/11/2007  . HYPERLIPIDEMIA 07/11/2007  . HYPERTENSION 07/11/2007  . HYPERTHYROIDISM 07/14/2007  . HYPOTHYROIDISM 11/06/2007  . OSTEOARTHRITIS, KNEE, RIGHT 10/27/2008  . PERIPHERAL EDEMA 05/31/2009  . PULMONARY NODULE 06/24/2008   granuloma  . TIA (transient ischemic attack) 08/13/2015  . Type II or unspecified type diabetes mellitus without mention of complication, uncontrolled 08/22/2012   Patient states that he is not diabetic, not taking any medications  . Vitamin D deficiency 08/12/2015  . WOUND, LEG 05/31/2009    Past Surgical History:  Procedure Laterality Date  . ANTERIOR CERVICAL DECOMP/DISCECTOMY FUSION N/A 01/06/2013   Procedure: ANTERIOR CERVICAL DECOMPRESSION/DISCECTOMY FUSION 1 LEVEL;  Surgeon: Erline Levine, MD;  Location: [facility];  Service: Neurosurgery;  Laterality: N/A;  Cervical three-four Anterior cervical decompression/diskectomy/fusion  . BACK SURGERY  1986   x 2  1986 C 3  . HERNIA REPAIR  sept 1986  . left knee replacement  Nov 21, 2007  . NECK SURGERY    . NISSEN FUNDOPLICATION  0086  . right heel bone spur  1987  . TOTAL KNEE ARTHROPLASTY  01/09/2012   Procedure: TOTAL KNEE ARTHROPLASTY;  Surgeon: Tobi Bastos, MD;  Location: [facility];   Service: Orthopedics;  Laterality: Right;    Social History   Socioeconomic History  . Marital status: Married    Spouse name: [name]  . Number of children: 2  . Years of education: HS  . Highest education level: Not on file  Occupational History  . Occupation: Retired  Scientific laboratory technician  . Financial resource strain: Not on file  . Food insecurity:    Worry: Not on file    Inability: Not on file  . Transportation needs:    Medical: Not on file    Non-medical: Not on file  Tobacco Use  . Smoking status: Former Smoker    Packs/day: 3.00    Years: 30.00    Pack years: 90.00    Types: Cigarettes    Last attempt to quit: 11/26/1982    Years since quitting: 36.0  . Smokeless tobacco: Never Used  Substance and Sexual Activity  . Alcohol use: No  . Drug use: No  . Sexual activity: Not on file  Lifestyle  . Physical activity:    Days per week: Not on file    Minutes per session: Not on file  . Stress: Not on file  Relationships  . Social connections:    Talks on phone: Not on file    Gets together: Not on file    Attends religious service: Not on file    Active member of club or organization: Not on file    Attends meetings of clubs or  organizations: Not on file    Relationship status: Not on file  . Intimate partner violence:    Fear of current or ex partner: Not on file    Emotionally abused: Not on file    Physically abused: Not on file    Forced sexual activity: Not on file  Other Topics Concern  . Not on file  Social History Narrative   Patient lives at home with spouse.   Caffiene Use: 4 cups daily    Current Outpatient Medications on File Prior to Visit  Medication Sig Dispense Refill  . ACCU-CHEK AVIVA PLUS test strip USE TO CHECK BLOOD SUGARS 2 TIMES DAILY 100 each 2  . ACCU-CHEK SOFTCLIX LANCETS lancets USE TO CHECK BLOOD SUGARS  TWO TIMES DAILY (Patient taking differently: 1 each 2 (two) times daily. ) 200 each 11  . amLODipine (NORVASC) 5 MG tablet Take 1  tablet (5 mg total) by mouth daily. 90 tablet 3  . aspirin EC 325 MG tablet Take 325 mg by mouth once as needed (when stroke-like symptoms present).     Marland Kitchen aspirin EC 81 MG tablet Take 81 mg by mouth daily.    Marland Kitchen atorvastatin (LIPITOR) 80 MG tablet Take 1 tablet (80 mg total) by mouth daily. (Patient taking differently: Take 80 mg by mouth at bedtime. ) 90 tablet 3  . Blood Glucose Monitoring Suppl (ACCU-CHEK AVIVA PLUS) w/Device KIT Use as directed 1 kit 0  . cetirizine (ZYRTEC) 10 MG tablet Take 1 tablet (10 mg total) by mouth daily. 90 tablet 3  . clopidogrel (PLAVIX) 75 MG tablet Take 1 tablet (75 mg total) by mouth daily. 90 tablet 3  . Coenzyme Q10 (CO Q-10 MAXIMUM STRENGTH) 400 MG CAPS Take 400 mg by mouth daily.     . fenofibrate 160 MG tablet Take 1 tablet (160 mg total) by mouth daily. 90 tablet 3  . gabapentin (NEURONTIN) 400 MG capsule TAKE 1 CAPSULE BY MOUTH AT  BEDTIME (Patient taking differently: Take 400 mg by mouth at bedtime. ) 90 capsule 1  . glucose blood (ONE TOUCH ULTRA TEST) test strip Use as instructed 100 each 12  . glucose blood test strip 1 each by Other route 2 (two) times daily. Use to check blood sugars twice a day Dx E11.9 300 each 2  . ibuprofen (ADVIL,MOTRIN) 200 MG tablet Take 400 mg by mouth every 6 (six) hours as needed (FOR PAIN).    Marland Kitchen labetalol (NORMODYNE) 300 MG tablet Take 1 tablet (300 mg total) by mouth 2 (two) times daily. 180 tablet 3  . levothyroxine (SYNTHROID, LEVOTHROID) 125 MCG tablet Take 1 tablet (125 mcg total) by mouth daily. 90 tablet 3  . metFORMIN (GLUCOPHAGE-XR) 500 MG 24 hr tablet Take 4 tablets (2,000 mg total) by mouth daily with breakfast. (Patient taking differently: Take 1,500 mg by mouth daily with breakfast. ) 360 tablet 3  . Omega-3 Fatty Acids (FISH OIL) 1000 MG CAPS Take 2,000 mg by mouth daily.     . Potassium 99 MG TABS Take 1 tablet by mouth daily.     No current facility-administered medications on file prior to visit.      Allergies  Allergen Reactions  . Codeine Other (See Comments)    Headaches   . Metformin And Related Other (See Comments)    Cannot tolerate in higher doses- has to drop from 2,000 mg daily to 1,500 mg daily to STOP the diarrhea that was occurring  . Naproxen Diarrhea  .  Prednisone Diarrhea  . Zolpidem Tartrate Other (See Comments)    Hallucinations   . Adhesive [Tape] Rash and Other (See Comments)    Redness, also (thinks he can tolerate paper tape)  . Penicillins Rash and Other (See Comments)    Welts Has patient had a PCN reaction causing immediate rash, facial/tongue/throat swelling, SOB or lightheadedness with hypotension: Yes Has patient had a PCN reaction causing severe rash involving mucus membranes or skin necrosis: No Has patient had a PCN reaction that required hospitalization: No Has patient had a PCN reaction occurring within the last 10 years: No If all of the above answers are "NO", then may proceed with Cephalosporin use.     Family History  Problem Relation Age of Onset  . Heart attack Mother   . Heart attack Father   . Emphysema Father   . Heart disease Other   . Diabetes Neg Hx   . Thyroid disease Neg Hx   . Hypercalcemia Neg Hx     BP 124/68 (BP Location: Left Arm, Patient Position: Sitting, Cuff Size: Normal)   Pulse 67   Ht 6' (1.829 m)   Wt 190 lb 9.6 oz (86.5 kg)   SpO2 96%   BMI 25.85 kg/m   Review of Systems Denies LOC    Objective:   Physical Exam VITAL SIGNS:  See vs page GENERAL: no distress Ext: no leg edema.   Gait: normal and steady  Lab Results  Component Value Date   PTH 5 (L) 12/08/2018   CALCIUM 10.6 (H) 12/08/2018   CALCIUM 10.6 (H) 12/08/2018   CAION 5.7 (H) 09/12/2017   PHOS 3.5 10/07/2015       Assessment & Plan:  Hypercalcemia: persistent: d/c HCTZ.  HTN: well-controlled   Patient Instructions  Please do blood tests today. Please come back for a follow-up appointment in 1 month.  our goals are normal  calcium and vitamin-D levels.

## 2018-12-12 LAB — VITAMIN D 1,25 DIHYDROXY
Vitamin D 1, 25 (OH)2 Total: 30 pg/mL (ref 18–72)
Vitamin D2 1, 25 (OH)2: <8 pg/mL
Vitamin D3 1, 25 (OH)2: 30 pg/mL

## 2018-12-12 LAB — PROTEIN ELECTROPHORESIS, SERUM
ALBUMIN ELP: 4.2 g/dL (ref 3.8–4.8)
Alpha 1: 0.3 g/dL (ref 0.2–0.3)
Alpha 2: 0.8 g/dL (ref 0.5–0.9)
Beta 2: 0.3 g/dL (ref 0.2–0.5)
Beta Globulin: 0.5 g/dL (ref 0.4–0.6)
Gamma Globulin: 0.9 g/dL (ref 0.8–1.7)
TOTAL PROTEIN: 7 g/dL (ref 6.1–8.1)

## 2018-12-12 LAB — PTH, INTACT AND CALCIUM
CALCIUM: 10.6 mg/dL — AB (ref 8.6–10.3)
PTH: 5 pg/mL — AB (ref 14–64)

## 2018-12-12 LAB — VITAMIN A: VITAMIN A (RETINOIC ACID): 74 ug/dL (ref 38–98)

## 2019-01-12 ENCOUNTER — Ambulatory Visit: Payer: Medicare Other | Admitting: Internal Medicine

## 2019-01-12 ENCOUNTER — Encounter: Payer: Self-pay | Admitting: Internal Medicine

## 2019-01-12 ENCOUNTER — Ambulatory Visit: Payer: Medicare Other | Admitting: Endocrinology

## 2019-01-12 DIAGNOSIS — E673 Hypervitaminosis D: Secondary | ICD-10-CM

## 2019-01-12 LAB — BASIC METABOLIC PANEL
BUN: 15 mg/dL (ref 6–23)
CO2: 28 mEq/L (ref 19–32)
CREATININE: 1.19 mg/dL (ref 0.40–1.50)
Calcium: 10.6 mg/dL — ABNORMAL HIGH (ref 8.4–10.5)
Chloride: 104 mEq/L (ref 96–112)
GFR: 59.62 mL/min — AB (ref >60.00)
Glucose, Bld: 148 mg/dL — ABNORMAL HIGH (ref 70–99)
Potassium: 4.2 mEq/L (ref 3.5–5.1)
Sodium: 142 mEq/L (ref 135–145)

## 2019-01-12 LAB — ALBUMIN: Albumin: 4.4 g/dL (ref 3.5–5.2)

## 2019-01-12 NOTE — Patient Instructions (Signed)
-   Please stop by the lab today, we will contact  You with the results of your calcium.

## 2019-01-12 NOTE — Progress Notes (Signed)
Name: [name]  MRN/ DOB: [id number], 04/22/1946    Age/ Sex: 73 y.o., male     PCP: Biagio Borg, MD   Reason for Endocrinology Evaluation: Hypercalcemia      Initial Endocrinology Clinic Visit: 09/07/2015    PATIENT IDENTIFIER: [name] is a 73 y.o., male with a past medical history of HTN, CVA, and hypothyroidism. He has followed with [facility] since 09/07/2015 for consultative assistance with management of his hypercalcemia.   HISTORICAL SUMMARY:  Patient was referred to our clinic in 2016 for evaluation of hypercalcemia that was noted on routine lab work with a serum calcium of 11.2 mg/dL( Corrected 10.56 mg/dL ).  This has been attributed to hydrochlorothiazide, work-up so far has been negative except for mildly elevated kappa free light chains.   SUBJECTIVE:   During last visit (12/08/2018): Hydrochlorothiazide dose has been reduced to half a tablet daily.  With a serum calcium of 10.6 mg/dL (none corrected)  Today (01/13/2019):  Mr. Polo is here for hypercalcemia with his wife.   [name] indicates that he was first diagnosed with hypercalcemia in 2016 at which time this was noted on routine blood work. Since that time, he denies experienced symptoms of constipation, polyuria,, she endorses polydipsia and occasional generalized weakness, diffuse muscle pains,denies significant memory impairment. He denies use of over the counter calcium except antacid on the rare occasion.  Denies  Lithium use in the past. He is on HCTZ, and vitamin D supplements.   He denies history of kidney stones, kidney disease, liver disease, granulomatous disease. He denies osteoporosis or prior fractures. Daily dietary calcium intake: 1 servings (cereal every other day). He denies family history of osteoporosis, parathyroid disease, thyroid disease.    He takes fish oil and potassium      ROS:  As per HPI.   HISTORY:  Past Medical History:  Past Medical  History:  Diagnosis Date  . ALLERGIC RHINITIS 11/06/2007  . Anemia, unspecified 08/22/2012  . BENIGN PROSTATIC HYPERTROPHY 07/14/2007  . COLONIC POLYPS, HX OF 07/14/2007  . Complication of anesthesia    pt states he needs to be cathed after surgery  . Degenerative arthritis of right knee 12/12/2011  . GERD 07/11/2007  . Headache(784.0) 07/11/2007  . HYPERLIPIDEMIA 07/11/2007  . HYPERTENSION 07/11/2007  . HYPERTHYROIDISM 07/14/2007  . HYPOTHYROIDISM 11/06/2007  . OSTEOARTHRITIS, KNEE, RIGHT 10/27/2008  . PERIPHERAL EDEMA 05/31/2009  . PULMONARY NODULE 06/24/2008   granuloma  . TIA (transient ischemic attack) 08/13/2015  . Type II or unspecified type diabetes mellitus without mention of complication, uncontrolled 08/22/2012   Patient states that he is not diabetic, not taking any medications  . Vitamin D deficiency 08/12/2015  . WOUND, LEG 05/31/2009   Past Surgical History:  Past Surgical History:  Procedure Laterality Date  . ANTERIOR CERVICAL DECOMP/DISCECTOMY FUSION N/A 01/06/2013   Procedure: ANTERIOR CERVICAL DECOMPRESSION/DISCECTOMY FUSION 1 LEVEL;  Surgeon: Erline Levine, MD;  Location: [facility];  Service: Neurosurgery;  Laterality: N/A;  Cervical three-four Anterior cervical decompression/diskectomy/fusion  . BACK SURGERY  1986   x 2  1986 C 3  . HERNIA REPAIR  sept 1986  . left knee replacement  Nov 21, 2007  . NECK SURGERY    . NISSEN FUNDOPLICATION  5465  . right heel bone spur  1987  . TOTAL KNEE ARTHROPLASTY  01/09/2012   Procedure: TOTAL KNEE ARTHROPLASTY;  Surgeon: Tobi Bastos, MD;  Location: [facility];  Service: Orthopedics;  Laterality: Right;    Social History:  reports that he quit smoking about 36 years ago. His smoking use included cigarettes. He has a 90.00 pack-year smoking history. He has never used smokeless tobacco. He reports that he does not drink alcohol or use drugs. Family History:  Family History  Problem Relation Age of Onset  . Heart attack Mother   .  Heart attack Father   . Emphysema Father   . Heart disease Other   . Diabetes Neg Hx   . Thyroid disease Neg Hx   . Hypercalcemia Neg Hx      HOME MEDICATIONS: Allergies as of 01/12/2019      Reactions   Codeine Other (See Comments)   Headaches    Metformin And Related Other (See Comments)   Cannot tolerate in higher doses- has to drop from 2,000 mg daily to 1,500 mg daily to STOP the diarrhea that was occurring   Naproxen Diarrhea   Prednisone Diarrhea   Zolpidem Tartrate Other (See Comments)   Hallucinations   Adhesive [tape] Rash, Other (See Comments)   Redness, also (thinks he can tolerate paper tape)   Penicillins Rash, Other (See Comments)   Welts Has patient had a PCN reaction causing immediate rash, facial/tongue/throat swelling, SOB or lightheadedness with hypotension: Yes Has patient had a PCN reaction causing severe rash involving mucus membranes or skin necrosis: No Has patient had a PCN reaction that required hospitalization: No Has patient had a PCN reaction occurring within the last 10 years: No If all of the above answers are "NO", then may proceed with Cephalosporin use.      Medication List       Accurate as of January 12, 2019 11:59 PM. Always use your most recent med list.        ACCU-CHEK AVIVA PLUS w/Device Kit Use as directed   ACCU-CHEK SOFTCLIX LANCETS lancets USE TO CHECK BLOOD SUGARS  TWO TIMES DAILY   amLODipine 5 MG tablet Commonly known as:  NORVASC Take 1 tablet (5 mg total) by mouth daily.   aspirin EC 325 MG tablet Take 325 mg by mouth once as needed (when stroke-like symptoms present).   atorvastatin 80 MG tablet Commonly known as:  LIPITOR Take 1 tablet (80 mg total) by mouth daily.   cetirizine 10 MG tablet Commonly known as:  ZYRTEC Take 1 tablet (10 mg total) by mouth daily.   clopidogrel 75 MG tablet Commonly known as:  PLAVIX Take 1 tablet (75 mg total) by mouth daily.   CO Q-10 MAXIMUM STRENGTH 400 MG  Caps Generic drug:  Coenzyme Q10 Take 400 mg by mouth daily.   fenofibrate 160 MG tablet Take 1 tablet (160 mg total) by mouth daily.   Fish Oil 1000 MG Caps Take 2,000 mg by mouth daily.   gabapentin 400 MG capsule Commonly known as:  NEURONTIN TAKE 1 CAPSULE BY MOUTH AT  BEDTIME   glucose blood test strip Commonly known as:  ONE TOUCH ULTRA TEST Use as instructed   glucose blood test strip 1 each by Other route 2 (two) times daily. Use to check blood sugars twice a day Dx E11.9   ACCU-CHEK AVIVA PLUS test strip Generic drug:  glucose blood USE TO CHECK BLOOD SUGARS 2 TIMES DAILY   hydrochlorothiazide 25 MG tablet Commonly known as:  HYDRODIURIL Take 12.5 mg by mouth daily.   ibuprofen 200 MG tablet Commonly known as:  ADVIL,MOTRIN Take 400 mg by mouth every 6 (six) hours as  needed (FOR PAIN).   labetalol 300 MG tablet Commonly known as:  NORMODYNE Take 1 tablet (300 mg total) by mouth 2 (two) times daily.   levothyroxine 125 MCG tablet Commonly known as:  SYNTHROID, LEVOTHROID Take 1 tablet (125 mcg total) by mouth daily.   metFORMIN 500 MG 24 hr tablet Commonly known as:  GLUCOPHAGE-XR Take 4 tablets (2,000 mg total) by mouth daily with breakfast.   Potassium 99 MG Tabs Take 1 tablet by mouth daily.         OBJECTIVE:   PHYSICAL EXAM: VS: BP 128/64 (BP Location: Left Arm, Patient Position: Sitting, Cuff Size: Normal)   Pulse 62   Resp 16   Ht 6' (1.829 m)   Wt 192 lb 3.2 oz (87.2 kg)   SpO2 97%   BMI 26.07 kg/m    EXAM: General: Pt appears well and is in NAD  Hydration: Well-hydrated with moist mucous membranes and good skin turgor  Eyes: External eye exam normal without stare, lid lag or exophthalmos.  EOM intact.  PERRL.  Ears, Nose, Throat: Hearing: Grossly intact bilaterally Dental: Good dentition  Throat: Clear without mass, erythema or exudate  Neck: General: Supple without adenopathy. Thyroid: Thyroid size normal.  No goiter or  nodules appreciated. No thyroid bruit.  Lungs: Clear with good BS bilat with no rales, rhonchi, or wheezes  Heart: Auscultation: RRR.  Abdomen: Normoactive bowel sounds, soft, nontender, without masses or organomegaly palpable  Extremities:  BL LE: No pretibial edema normal ROM and strength.  Skin: Hair: Texture and amount normal with gender appropriate distribution Skin Inspection: No rashes. Skin Palpation: Skin temperature, texture, and thickness normal to palpation  Neuro: Cranial nerves: II - XII grossly intact  Motor: Normal strength throughout DTRs: 2+ and symmetric in UE without delay in relaxation phase  Mental Status: Judgment, insight: Intact Orientation: Oriented to time, place, and person Mood and affect: No depression, anxiety, or agitation     DATA REVIEWED:  Results for KENDRELL, LOTTMAN (MRN [id number]) as of 01/14/2019 12:23  Ref. Range 01/12/2019 14:42  Sodium Latest Ref Range: 135 - 145 mEq/L 142  Potassium Latest Ref Range: 3.5 - 5.1 mEq/L 4.2  Chloride Latest Ref Range: 96 - 112 mEq/L 104  CO2 Latest Ref Range: 19 - 32 mEq/L 28  Glucose Latest Ref Range: 70 - 99 mg/dL 148 (H)  BUN Latest Ref Range: 6 - 23 mg/dL 15  Creatinine Latest Ref Range: 0.40 - 1.50 mg/dL 1.19  Calcium Latest Ref Range: 8.4 - 10.5 mg/dL 10.6 (H)  Albumin Latest Ref Range: 3.5 - 5.2 g/dL 4.4  GFR Latest Ref Range: >60.00 mL/min 59.62 (L)  VITD Latest Ref Range: 30.00 - 100.00 ng/mL 106.42 (HH)  TSH Latest Ref Range: 0.35 - 4.50 uIU/mL 2.12  T4,Free(Direct) Latest Ref Range: 0.60 - 1.60 ng/dL 0.95    Old records , labs and images have been reviewed.   ASSESSMENT / PLAN / RECOMMENDATIONS:   1. Non-PTH Hypercalcemia :    -This could be multifactorial due to high vitamin D levels, and HCTZ use. -So far his PTH RP level has been normal. -There is no clinical suspicion for pheochromocytoma. -Repeat BMP today continues to show high calcium in addition to high vitamin D. -If  hypercalcemia does not resolve with the previous recommendations ,may consider hematology consult for the mildly elevated kappa light chains on labs   Medications  - STOP HCTZ - STOP all vitamin D supplements - Recheck on next visit  2. Hypervitaminosis D :   - Will stop all Vitamin D containing supplements and recheck in a few weeks.   Follow-up in 6 weeks   Signed electronically by: Mack Guise, MD  [facility]  [facility] [address] [place], [place] [address] Phone: [phone] FAX: [phone]      CC: Biagio Borg, MD [address] [place] [address] Phone: [phone]  Fax: [phone]   Return to Endocrinology clinic as below: Future Appointments  Date Time Provider [place]  02/23/2019  1:15 PM Renato Shin, MD [facility] None  03/03/2019  1:40 PM Biagio Borg, MD [facility]

## 2019-01-13 ENCOUNTER — Telehealth: Payer: Self-pay

## 2019-01-13 LAB — T4, FREE: Free T4: 0.95 ng/dL (ref 0.60–1.60)

## 2019-01-13 LAB — VITAMIN D 25 HYDROXY (VIT D DEFICIENCY, FRACTURES): VITD: 106.42 ng/mL (ref 30.00–100.00)

## 2019-01-13 LAB — TSH: TSH: 2.12 u[IU]/mL (ref 0.35–4.50)

## 2019-01-13 NOTE — Telephone Encounter (Signed)
Main lab just called with a critical vit d of 106.42.

## 2019-01-13 NOTE — Telephone Encounter (Signed)
please contact patient: In addition to what Dr Maretta Bees said, please come back for a follow-up appointment in 2 weeks

## 2019-01-13 NOTE — Telephone Encounter (Signed)
Forwarding for action

## 2019-01-13 NOTE — Telephone Encounter (Signed)
LVM requesting returned call 

## 2019-01-14 DIAGNOSIS — E673 Hypervitaminosis D: Secondary | ICD-10-CM

## 2019-01-14 HISTORY — DX: Hypervitaminosis D: E67.3

## 2019-01-14 NOTE — Telephone Encounter (Signed)
Please see results note. Pt's wife was made aware of both messages.

## 2019-01-26 ENCOUNTER — Other Ambulatory Visit: Payer: Self-pay

## 2019-01-26 ENCOUNTER — Ambulatory Visit: Payer: Medicare Other | Admitting: Endocrinology

## 2019-01-26 ENCOUNTER — Encounter: Payer: Self-pay | Admitting: Endocrinology

## 2019-01-26 LAB — BASIC METABOLIC PANEL
BUN: 16 mg/dL (ref 6–23)
CO2: 25 meq/L (ref 19–32)
CREATININE: 1.15 mg/dL (ref 0.40–1.50)
Calcium: 10.1 mg/dL (ref 8.4–10.5)
Chloride: 105 mEq/L (ref 96–112)
GFR: 62.01 mL/min (ref >60.00)
Glucose, Bld: 147 mg/dL — ABNORMAL HIGH (ref 70–99)
Potassium: 4 mEq/L (ref 3.5–5.1)
Sodium: 140 mEq/L (ref 135–145)

## 2019-01-26 LAB — VITAMIN D 25 HYDROXY (VIT D DEFICIENCY, FRACTURES): VITD: 99.78 ng/mL (ref 30.00–100.00)

## 2019-01-26 NOTE — Patient Instructions (Signed)
Blood tests are requested for you today.  We'll let you know about the results.  Please come back for a follow-up appointment in 3 months.  our goals are normal calcium and vitamin-D levels.

## 2019-01-26 NOTE — Progress Notes (Signed)
Subjective:    Patient ID: [name], male    DOB: January 27, 1946, 73 y.o.   MRN: [id number]  HPI  Pt returns for f/u of hypercalcemia (dx'ed 2018 (it was normal in 2017); only cause found was HCTZ; 24-HR urine Ca++ was 336 in 2016; PTH and PTHrP were low; he stopped Vit-D supplement in 2/20, due to elev level).  He has moderate cramps in the legs, but no assoc numbness.  He is off HCTZ now.   Past Medical History:  Diagnosis Date  . ALLERGIC RHINITIS 11/06/2007  . Anemia, unspecified 08/22/2012  . BENIGN PROSTATIC HYPERTROPHY 07/14/2007  . COLONIC POLYPS, HX OF 07/14/2007  . Complication of anesthesia    pt states he needs to be cathed after surgery  . Degenerative arthritis of right knee 12/12/2011  . GERD 07/11/2007  . Headache(784.0) 07/11/2007  . HYPERLIPIDEMIA 07/11/2007  . HYPERTENSION 07/11/2007  . HYPERTHYROIDISM 07/14/2007  . HYPOTHYROIDISM 11/06/2007  . OSTEOARTHRITIS, KNEE, RIGHT 10/27/2008  . PERIPHERAL EDEMA 05/31/2009  . PULMONARY NODULE 06/24/2008   granuloma  . TIA (transient ischemic attack) 08/13/2015  . Type II or unspecified type diabetes mellitus without mention of complication, uncontrolled 08/22/2012   Patient states that he is not diabetic, not taking any medications  . Vitamin D deficiency 08/12/2015  . WOUND, LEG 05/31/2009    Past Surgical History:  Procedure Laterality Date  . ANTERIOR CERVICAL DECOMP/DISCECTOMY FUSION N/A 01/06/2013   Procedure: ANTERIOR CERVICAL DECOMPRESSION/DISCECTOMY FUSION 1 LEVEL;  Surgeon: Erline Levine, MD;  Location: [facility];  Service: Neurosurgery;  Laterality: N/A;  Cervical three-four Anterior cervical decompression/diskectomy/fusion  . BACK SURGERY  1986   x 2  1986 C 3  . HERNIA REPAIR  sept 1986  . left knee replacement  Nov 21, 2007  . NECK SURGERY    . NISSEN FUNDOPLICATION  6606  . right heel bone spur  1987  . TOTAL KNEE ARTHROPLASTY  01/09/2012   Procedure: TOTAL KNEE ARTHROPLASTY;  Surgeon: Tobi Bastos, MD;   Location: [facility];  Service: Orthopedics;  Laterality: Right;    Social History   Socioeconomic History  . Marital status: Married    Spouse name: [name]  . Number of children: 2  . Years of education: HS  . Highest education level: Not on file  Occupational History  . Occupation: Retired  Scientific laboratory technician  . Financial resource strain: Not on file  . Food insecurity:    Worry: Not on file    Inability: Not on file  . Transportation needs:    Medical: Not on file    Non-medical: Not on file  Tobacco Use  . Smoking status: Former Smoker    Packs/day: 3.00    Years: 30.00    Pack years: 90.00    Types: Cigarettes    Last attempt to quit: 11/26/1982    Years since quitting: 36.1  . Smokeless tobacco: Never Used  Substance and Sexual Activity  . Alcohol use: No  . Drug use: No  . Sexual activity: Not on file  Lifestyle  . Physical activity:    Days per week: Not on file    Minutes per session: Not on file  . Stress: Not on file  Relationships  . Social connections:    Talks on phone: Not on file    Gets together: Not on file    Attends religious service: Not on file    Active member of club or organization: Not on file  Attends meetings of clubs or organizations: Not on file    Relationship status: Not on file  . Intimate partner violence:    Fear of current or ex partner: Not on file    Emotionally abused: Not on file    Physically abused: Not on file    Forced sexual activity: Not on file  Other Topics Concern  . Not on file  Social History Narrative   Patient lives at home with spouse.   Caffiene Use: 4 cups daily    Current Outpatient Medications on File Prior to Visit  Medication Sig Dispense Refill  . ACCU-CHEK AVIVA PLUS test strip USE TO CHECK BLOOD SUGARS 2 TIMES DAILY 100 each 2  . ACCU-CHEK SOFTCLIX LANCETS lancets USE TO CHECK BLOOD SUGARS  TWO TIMES DAILY (Patient taking differently: 1 each 2 (two) times daily. ) 200 each 11  . amLODipine (NORVASC) 5  MG tablet Take 1 tablet (5 mg total) by mouth daily. 90 tablet 3  . aspirin EC 325 MG tablet Take 325 mg by mouth once as needed (when stroke-like symptoms present).     Marland Kitchen atorvastatin (LIPITOR) 80 MG tablet Take 1 tablet (80 mg total) by mouth daily. (Patient taking differently: Take 80 mg by mouth at bedtime. ) 90 tablet 3  . Blood Glucose Monitoring Suppl (ACCU-CHEK AVIVA PLUS) w/Device KIT Use as directed 1 kit 0  . cetirizine (ZYRTEC) 10 MG tablet Take 1 tablet (10 mg total) by mouth daily. 90 tablet 3  . clopidogrel (PLAVIX) 75 MG tablet Take 1 tablet (75 mg total) by mouth daily. 90 tablet 3  . fenofibrate 160 MG tablet Take 1 tablet (160 mg total) by mouth daily. 90 tablet 3  . gabapentin (NEURONTIN) 400 MG capsule TAKE 1 CAPSULE BY MOUTH AT  BEDTIME (Patient taking differently: Take 400 mg by mouth at bedtime. ) 90 capsule 1  . glucose blood (ONE TOUCH ULTRA TEST) test strip Use as instructed 100 each 12  . glucose blood test strip 1 each by Other route 2 (two) times daily. Use to check blood sugars twice a day Dx E11.9 300 each 2  . ibuprofen (ADVIL,MOTRIN) 200 MG tablet Take 400 mg by mouth every 6 (six) hours as needed (FOR PAIN).    Marland Kitchen labetalol (NORMODYNE) 300 MG tablet Take 1 tablet (300 mg total) by mouth 2 (two) times daily. 180 tablet 3  . levothyroxine (SYNTHROID, LEVOTHROID) 125 MCG tablet Take 1 tablet (125 mcg total) by mouth daily. 90 tablet 3  . metFORMIN (GLUCOPHAGE-XR) 500 MG 24 hr tablet Take 4 tablets (2,000 mg total) by mouth daily with breakfast. (Patient taking differently: Take 1,500 mg by mouth daily with breakfast. Takes 3 tablets daily) 360 tablet 3  . Potassium 99 MG TABS Take 1 tablet by mouth daily.     No current facility-administered medications on file prior to visit.     Allergies  Allergen Reactions  . Codeine Other (See Comments)    Headaches   . Metformin And Related Other (See Comments)    Cannot tolerate in higher doses- has to drop from 2,000 mg  daily to 1,500 mg daily to STOP the diarrhea that was occurring  . Naproxen Diarrhea  . Prednisone Diarrhea  . Zolpidem Tartrate Other (See Comments)    Hallucinations   . Adhesive [Tape] Rash and Other (See Comments)    Redness, also (thinks he can tolerate paper tape)  . Penicillins Rash and Other (See Comments)    Welts  Has patient had a PCN reaction causing immediate rash, facial/tongue/throat swelling, SOB or lightheadedness with hypotension: Yes Has patient had a PCN reaction causing severe rash involving mucus membranes or skin necrosis: No Has patient had a PCN reaction that required hospitalization: No Has patient had a PCN reaction occurring within the last 10 years: No If all of the above answers are "NO", then may proceed with Cephalosporin use.     Family History  Problem Relation Age of Onset  . Heart attack Mother   . Heart attack Father   . Emphysema Father   . Heart disease Other   . Diabetes Neg Hx   . Thyroid disease Neg Hx   . Hypercalcemia Neg Hx     BP 130/70 (BP Location: Right Arm, Patient Position: Sitting, Cuff Size: Normal)   Pulse 64   Ht 6' (1.829 m)   Wt 190 lb 9.6 oz (86.5 kg)   SpO2 97%   BMI 25.85 kg/m    Review of Systems He denies hematuria.  He has occ falls    Objective:   Physical Exam VITAL SIGNS:  See vs page GENERAL: no distress Ext: no leg edema.   Gait: steady with a cane.     Lab Results  Component Value Date   PTH 3 (L) 01/26/2019   CALCIUM 10.1 01/26/2019   CALCIUM 10.1 01/26/2019   CAION 5.7 (H) 09/12/2017   PHOS 3.5 10/07/2015   Lab Results  Component Value Date   CREATININE 1.15 01/26/2019   BUN 16 01/26/2019   NA 140 01/26/2019   K 4.0 01/26/2019   CL 105 01/26/2019   CO2 25 01/26/2019      Assessment & Plan:  HTN: well-controlled. Please continue the same medication.   Hypercalcemia: resolved off HCTZ elev vit-D, improved.  Pt is advised to stay off supplement Leg cramps: not related to the  above  Patient Instructions  Blood tests are requested for you today.  We'll let you know about the results.  Please come back for a follow-up appointment in 3 months.  our goals are normal calcium and vitamin-D levels.

## 2019-01-27 LAB — PTH, INTACT AND CALCIUM
Calcium: 10.1 mg/dL (ref 8.6–10.3)
PTH: 3 pg/mL — AB (ref 14–64)

## 2019-02-23 ENCOUNTER — Ambulatory Visit: Payer: Medicare Other | Admitting: Endocrinology

## 2019-02-26 ENCOUNTER — Other Ambulatory Visit: Payer: Self-pay

## 2019-02-26 MED ORDER — CLOPIDOGREL BISULFATE 75 MG PO TABS: 75.0000 mg | ORAL_TABLET | Freq: Every day | ORAL | 3 refills | Status: DC

## 2019-02-26 MED ORDER — FENOFIBRATE 160 MG PO TABS: 160.0000 mg | ORAL_TABLET | Freq: Every day | ORAL | 3 refills | Status: DC

## 2019-02-26 MED ORDER — METFORMIN HCL ER 500 MG PO TB24: 2000.0000 mg | ORAL_TABLET | Freq: Every day | ORAL | 3 refills | Status: DC

## 2019-02-26 MED ORDER — LEVOTHYROXINE SODIUM 125 MCG PO TABS: 125.0000 ug | ORAL_TABLET | Freq: Every day | ORAL | 3 refills | Status: DC

## 2019-02-26 MED ORDER — ASPIRIN EC 325 MG PO TBEC: 325.0000 mg | DELAYED_RELEASE_TABLET | Freq: Once | ORAL | 3 refills | Status: DC | PRN

## 2019-02-26 MED ORDER — LABETALOL HCL 300 MG PO TABS: 300.0000 mg | ORAL_TABLET | Freq: Two times a day (BID) | ORAL | 3 refills | Status: DC

## 2019-02-26 MED ORDER — ATORVASTATIN CALCIUM 80 MG PO TABS: 80.0000 mg | ORAL_TABLET | Freq: Every day | ORAL | 3 refills | Status: DC

## 2019-02-26 NOTE — Telephone Encounter (Signed)
Pt has rescheduled 65mo follow up due to covid19 concerns. Med refills has been sent in also.

## 2019-03-03 ENCOUNTER — Ambulatory Visit: Payer: Medicare Other | Admitting: Internal Medicine

## 2019-04-13 ENCOUNTER — Ambulatory Visit: Payer: Medicare Other | Admitting: Internal Medicine

## 2019-04-28 ENCOUNTER — Ambulatory Visit (INDEPENDENT_AMBULATORY_CARE_PROVIDER_SITE_OTHER): Payer: Medicare Other | Admitting: Endocrinology

## 2019-04-28 ENCOUNTER — Telehealth: Payer: Self-pay | Admitting: Endocrinology

## 2019-04-28 ENCOUNTER — Other Ambulatory Visit: Payer: Self-pay

## 2019-04-28 NOTE — Patient Instructions (Addendum)
Please continue the same medications for now. Please come back for a follow-up appointment in 3 months.  Please do blood tests 2-3 business day ahead.   our goals are normal calcium and vitamin-D levels.

## 2019-04-28 NOTE — Progress Notes (Signed)
Subjective:    Patient ID: [name], male    DOB: 10-06-1946, 73 y.o.   MRN: [id number]  HPI telehealth visit today via phone x 9 minutes Alternatives to telehealth are presented to this patient, and the patient agrees to the telehealth visit. Pt is advised of the cost of the visit, and agrees to this, also.   Patient is at home, and I am at the office.   Persons attending the telehealth visit: the patient and I Pt returns for f/u of hypercalcemia (dx'ed 2018 (it was normal in 2017); only cause found was HCTZ; 24-HR urine Ca++ was 336 in 2016; PTH and PTHrP were low; he has never had urolithiasis; he stopped Vit-D supplement in 2/20, due to elev level; calcium normalized in early 2020 with med changes).  pt states he feels well in general.  Past Medical History:  Diagnosis Date   ALLERGIC RHINITIS 11/06/2007   Anemia, unspecified 08/22/2012   BENIGN PROSTATIC HYPERTROPHY 07/14/2007   COLONIC POLYPS, HX OF 5/36/6440   Complication of anesthesia    pt states he needs to be cathed after surgery   Degenerative arthritis of right knee 12/12/2011   GERD 07/11/2007   Headache(784.0) 07/11/2007   HYPERLIPIDEMIA 07/11/2007   HYPERTENSION 07/11/2007   HYPERTHYROIDISM 07/14/2007   HYPOTHYROIDISM 11/06/2007   OSTEOARTHRITIS, KNEE, RIGHT 10/27/2008   PERIPHERAL EDEMA 05/31/2009   PULMONARY NODULE 06/24/2008   granuloma   TIA (transient ischemic attack) 08/13/2015   Type II or unspecified type diabetes mellitus without mention of complication, uncontrolled 08/22/2012   Patient states that he is not diabetic, not taking any medications   Vitamin D deficiency 08/12/2015   WOUND, LEG 05/31/2009    Past Surgical History:  Procedure Laterality Date   ANTERIOR CERVICAL DECOMP/DISCECTOMY FUSION N/A 01/06/2013   Procedure: ANTERIOR CERVICAL DECOMPRESSION/DISCECTOMY FUSION 1 LEVEL;  Surgeon: Erline Levine, MD;  Location: [facility];  Service: Neurosurgery;  Laterality: N/A;  Cervical  three-four Anterior cervical decompression/diskectomy/fusion   BACK SURGERY  1986   x 2  1986 C 3   HERNIA REPAIR  sept 1986   left knee replacement  Nov 21, 2007   NECK SURGERY     NISSEN FUNDOPLICATION  3474   right heel bone spur  1987   TOTAL KNEE ARTHROPLASTY  01/09/2012   Procedure: TOTAL KNEE ARTHROPLASTY;  Surgeon: Tobi Bastos, MD;  Location: [facility];  Service: Orthopedics;  Laterality: Right;    Social History   Socioeconomic History   Marital status: Married    Spouse name: [name]   Number of children: 2   Years of education: HS   Highest education level: Not on file  Occupational History   Occupation: Retired  Scientist, product/process development strain: Not on file   Food insecurity:    Worry: Not on file    Inability: Not on Lexicographer needs:    Medical: Not on file    Non-medical: Not on file  Tobacco Use   Smoking status: Former Smoker    Packs/day: 3.00    Years: 30.00    Pack years: 90.00    Types: Cigarettes    Last attempt to quit: 11/26/1982    Years since quitting: 36.4   Smokeless tobacco: Never Used  Substance and Sexual Activity   Alcohol use: No   Drug use: No   Sexual activity: Not on file  Lifestyle   Physical activity:    Days per week: Not  file  °  Minutes per session: Not on file  °• Stress: Not on file  °Relationships  °• Social connections:  °  Talks on phone: Not on file  °  Gets together: Not on file  °  Attends religious service: Not on file  °  Active member of club or organization: Not on file  °  Attends meetings of clubs or organizations: Not on file  °  Relationship status: Not on file  °• Intimate partner violence:  °  Fear of current or ex partner: Not on file  °  Emotionally abused: Not on file  °  Physically abused: Not on file  °  Forced sexual activity: Not on file  °Other Topics Concern  °• Not on file  °Social History Narrative  ° Patient lives at home with spouse.  ° Caffiene Use: 4 cups  daily  ° ° °Current Outpatient Medications on File Prior to Visit  °Medication Sig Dispense Refill  °• ACCU-CHEK AVIVA PLUS test strip USE TO CHECK BLOOD SUGARS 2 TIMES DAILY 100 each 2  °• ACCU-CHEK SOFTCLIX LANCETS lancets USE TO CHECK BLOOD SUGARS  TWO TIMES DAILY (Patient taking differently: 1 each 2 (two) times daily. ) 200 each 11  °• amLODipine (NORVASC) 5 MG tablet Take 1 tablet (5 mg total) by mouth daily. 90 tablet 3  °• aspirin EC 325 MG tablet Take 1 tablet (325 mg total) by mouth once as needed (when stroke-like symptoms present). 90 tablet 3  °• atorvastatin (LIPITOR) 80 MG tablet Take 1 tablet (80 mg total) by mouth daily. 90 tablet 3  °• Blood Glucose Monitoring Suppl (ACCU-CHEK AVIVA PLUS) w/Device KIT Use as directed 1 kit 0  °• cetirizine (ZYRTEC) 10 MG tablet Take 1 tablet (10 mg total) by mouth daily. 90 tablet 3  °• clopidogrel (PLAVIX) 75 MG tablet Take 1 tablet (75 mg total) by mouth daily. 90 tablet 3  °• fenofibrate 160 MG tablet Take 1 tablet (160 mg total) by mouth daily. 90 tablet 3  °• gabapentin (NEURONTIN) 400 MG capsule TAKE 1 CAPSULE BY MOUTH AT  BEDTIME (Patient taking differently: Take 400 mg by mouth at bedtime. ) 90 capsule 1  °• glucose blood (ONE TOUCH ULTRA TEST) test strip Use as instructed 100 each 12  °• glucose blood test strip 1 each by Other route 2 (two) times daily. Use to check blood sugars twice a day °Dx E11.9 300 each 2  °• ibuprofen (ADVIL,MOTRIN) 200 MG tablet Take 400 mg by mouth every 6 (six) hours as needed (FOR PAIN).    °• labetalol (NORMODYNE) 300 MG tablet Take 1 tablet (300 mg total) by mouth 2 (two) times daily. 180 tablet 3  °• levothyroxine (SYNTHROID, LEVOTHROID) 125 MCG tablet Take 1 tablet (125 mcg total) by mouth daily. 90 tablet 3  °• metFORMIN (GLUCOPHAGE-XR) 500 MG 24 hr tablet Take 4 tablets (2,000 mg total) by mouth daily with breakfast. 360 tablet 3  °• Potassium 99 MG TABS Take 1 tablet by mouth daily.    ° °No current facility-administered  medications on file prior to visit.   ° ° °Allergies  °Allergen Reactions  °• Codeine Other (See Comments)  °  Headaches   °• Metformin And Related Other (See Comments)  °  Cannot tolerate in higher doses- has to drop from 2,000 mg daily to 1,500 mg daily to STOP the diarrhea that was occurring  °• Naproxen Diarrhea  °• Prednisone Diarrhea  °• Zolpidem Tartrate   Tartrate Other (See Comments)    Hallucinations    Adhesive [Tape] Rash and Other (See Comments)    Redness, also (thinks he can tolerate paper tape)   Penicillins Rash and Other (See Comments)    Welts Has patient had a PCN reaction causing immediate rash, facial/tongue/throat swelling, SOB or lightheadedness with hypotension: Yes Has patient had a PCN reaction causing severe rash involving mucus membranes or skin necrosis: No Has patient had a PCN reaction that required hospitalization: No Has patient had a PCN reaction occurring within the last 10 years: No If all of the above answers are "NO", then may proceed with Cephalosporin use.     Family History  Problem Relation Age of Onset   Heart attack Mother    Heart attack Father    Emphysema Father    Heart disease Other    Diabetes Neg Hx    Thyroid disease Neg Hx    Hypercalcemia Neg Hx     Review of Systems Denies numbness and cramps.      Objective:   Physical Exam    Lab Results  Component Value Date   PTH 3 (L) 01/26/2019   CALCIUM 10.1 01/26/2019   CALCIUM 10.1 01/26/2019   CAION 5.7 (H) 09/12/2017   PHOS 3.5 10/07/2015      Assessment & Plan:  Hypercalcemia: we discussed.  he declines labs now.    Patient Instructions  Please continue the same medications for now. Please come back for a follow-up appointment in 3 months.  Please do blood tests 2-3 business day ahead.   our goals are normal calcium and vitamin-D levels.

## 2019-04-28 NOTE — Telephone Encounter (Signed)
Patient states that he was told to call our office to give the following information: Patient's Pharmacy is [facility].

## 2019-06-17 ENCOUNTER — Encounter: Payer: Self-pay | Admitting: Gastroenterology

## 2019-07-07 ENCOUNTER — Other Ambulatory Visit: Payer: Self-pay | Admitting: Internal Medicine

## 2019-08-19 ENCOUNTER — Ambulatory Visit: Payer: Medicare Other | Admitting: Internal Medicine

## 2019-08-31 ENCOUNTER — Other Ambulatory Visit (INDEPENDENT_AMBULATORY_CARE_PROVIDER_SITE_OTHER): Payer: Medicare Other

## 2019-08-31 ENCOUNTER — Ambulatory Visit (INDEPENDENT_AMBULATORY_CARE_PROVIDER_SITE_OTHER): Payer: Medicare Other | Admitting: Internal Medicine

## 2019-08-31 ENCOUNTER — Other Ambulatory Visit: Payer: Self-pay

## 2019-08-31 ENCOUNTER — Encounter: Payer: Self-pay | Admitting: Internal Medicine

## 2019-08-31 VITALS — BP 122/76 | HR 59 | Temp 98.6°F | Ht 72.0 in | Wt 185.0 lb

## 2019-08-31 DIAGNOSIS — I10 Essential (primary) hypertension: Secondary | ICD-10-CM

## 2019-08-31 DIAGNOSIS — Z0001 Encounter for general adult medical examination with abnormal findings: Secondary | ICD-10-CM | POA: Diagnosis not present

## 2019-08-31 DIAGNOSIS — E538 Deficiency of other specified B group vitamins: Secondary | ICD-10-CM

## 2019-08-31 DIAGNOSIS — Z23 Encounter for immunization: Secondary | ICD-10-CM | POA: Diagnosis not present

## 2019-08-31 DIAGNOSIS — R809 Proteinuria, unspecified: Secondary | ICD-10-CM

## 2019-08-31 DIAGNOSIS — M549 Dorsalgia, unspecified: Secondary | ICD-10-CM

## 2019-08-31 DIAGNOSIS — R634 Abnormal weight loss: Secondary | ICD-10-CM | POA: Insufficient documentation

## 2019-08-31 DIAGNOSIS — R0600 Dyspnea, unspecified: Secondary | ICD-10-CM

## 2019-08-31 DIAGNOSIS — E559 Vitamin D deficiency, unspecified: Secondary | ICD-10-CM

## 2019-08-31 DIAGNOSIS — E611 Iron deficiency: Secondary | ICD-10-CM | POA: Diagnosis not present

## 2019-08-31 DIAGNOSIS — E119 Type 2 diabetes mellitus without complications: Secondary | ICD-10-CM

## 2019-08-31 HISTORY — DX: Dorsalgia, unspecified: M54.9

## 2019-08-31 HISTORY — DX: Dyspnea, unspecified: R06.00

## 2019-08-31 HISTORY — DX: Proteinuria, unspecified: R80.9

## 2019-08-31 HISTORY — DX: Abnormal weight loss: R63.4

## 2019-08-31 LAB — CBC WITH DIFFERENTIAL/PLATELET
Basophils Absolute: 0.1 10*3/uL (ref 0.0–0.1)
Basophils Relative: 0.9 % (ref 0.0–3.0)
Eosinophils Absolute: 0.5 10*3/uL (ref 0.0–0.7)
Eosinophils Relative: 6.2 % — ABNORMAL HIGH (ref 0.0–5.0)
HCT: 39.8 % (ref 39.0–52.0)
Hemoglobin: 13.2 g/dL (ref 13.0–17.0)
Lymphocytes Relative: 25.4 % (ref 12.0–46.0)
Lymphs Abs: 2.2 10*3/uL (ref 0.7–4.0)
MCHC: 33.2 g/dL (ref 30.0–36.0)
MCV: 93 fl (ref 78.0–100.0)
Monocytes Absolute: 0.6 10*3/uL (ref 0.1–1.0)
Monocytes Relative: 7.4 % (ref 3.0–12.0)
Neutro Abs: 5.3 10*3/uL (ref 1.4–7.7)
Neutrophils Relative %: 60.1 % (ref 43.0–77.0)
Platelets: 295 10*3/uL (ref 150.0–400.0)
RBC: 4.28 Mil/uL (ref 4.22–5.81)
RDW: 15.2 % (ref 11.5–15.5)
WBC: 8.8 10*3/uL (ref 4.0–10.5)

## 2019-08-31 LAB — BRAIN NATRIURETIC PEPTIDE: Pro B Natriuretic peptide (BNP): 97 pg/mL (ref 0.0–100.0)

## 2019-08-31 MED ORDER — LEVOTHYROXINE SODIUM 125 MCG PO TABS: 125.0000 ug | ORAL_TABLET | Freq: Every day | ORAL | 3 refills | Status: DC

## 2019-08-31 MED ORDER — GABAPENTIN 400 MG PO CAPS: 400.0000 mg | ORAL_CAPSULE | Freq: Three times a day (TID) | ORAL | 1 refills | Status: DC

## 2019-08-31 MED ORDER — LABETALOL HCL 300 MG PO TABS: 300.0000 mg | ORAL_TABLET | Freq: Two times a day (BID) | ORAL | 3 refills | Status: DC

## 2019-08-31 MED ORDER — ATORVASTATIN CALCIUM 80 MG PO TABS: 80.0000 mg | ORAL_TABLET | Freq: Every day | ORAL | 3 refills | Status: DC

## 2019-08-31 MED ORDER — CETIRIZINE HCL 10 MG PO TABS: 10.0000 mg | ORAL_TABLET | Freq: Every day | ORAL | 3 refills | Status: DC

## 2019-08-31 MED ORDER — AMLODIPINE BESYLATE 5 MG PO TABS: 5.0000 mg | ORAL_TABLET | Freq: Every day | ORAL | 3 refills | Status: DC

## 2019-08-31 MED ORDER — CLOPIDOGREL BISULFATE 75 MG PO TABS: 75.0000 mg | ORAL_TABLET | Freq: Every day | ORAL | 3 refills | Status: DC

## 2019-08-31 MED ORDER — METFORMIN HCL ER 500 MG PO TB24: 2000.0000 mg | ORAL_TABLET | Freq: Every day | ORAL | 3 refills | Status: DC

## 2019-08-31 MED ORDER — CELECOXIB 200 MG PO CAPS: 200.0000 mg | ORAL_CAPSULE | Freq: Two times a day (BID) | ORAL | 3 refills | Status: DC

## 2019-08-31 NOTE — Progress Notes (Signed)
Subjective:    Patient ID: [name], male    DOB: 11-12-1946, 73 y.o.   MRN: [id number]  HPI  Here for wellness and f/u;  Overall doing ok;Marland Kitchen  Pt denies polydipsia, polyuria, or low sugar symptoms. Pt states overall good compliance with treatment and medications, good tolerability, and has been trying to follow appropriate diet.  Pt denies worsening depressive symptoms, suicidal ideation or panic. No fever, night sweats, wt loss, loss of appetite, or other constitutional symptoms.  Pt states good ability with ADL's, has low fall risk, home safety reviewed and adequate, no other significant changes in hearing or vision, and only occasionally active with exercise. Pt denies chest pain, wheezing, orthopnea, PND, increased LE swelling, palpitations, dizziness or syncope, except for worsening recent sob/doe now more difficult to get from room to room with cane.   Also, Pt denies new neurological symptoms such as new headache, or facial or extremity weakness or numbness, but c/o pain and atrophy of the right trapezoid area, better with ibuprofen but only for 2 hrs, and takes 6-8 per day.  Denies worsening reflux, abd pain, dysphagia, n/v, bowel change or blood.   Also has recurrent falls, walks with cane, and PT no helping.  Also, A1c at 6.3 recently per [facility] nurse Jul 15 2019. Has been over 8 at last visit. Not sure why losing wt.   Wt Readings from Last 3 Encounters:  08/31/19 185 lb (83.9 kg)  01/26/19 190 lb 9.6 oz (86.5 kg)  01/12/19 192 lb 3.2 oz (87.2 kg)   Past Medical History:  Diagnosis Date  . ALLERGIC RHINITIS 11/06/2007  . Anemia, unspecified 08/22/2012  . BENIGN PROSTATIC HYPERTROPHY 07/14/2007  . COLONIC POLYPS, HX OF 07/14/2007  . Complication of anesthesia    pt states he needs to be cathed after surgery  . Degenerative arthritis of right knee 12/12/2011  . GERD 07/11/2007  . Headache(784.0) 07/11/2007  . HYPERLIPIDEMIA 07/11/2007  . HYPERTENSION 07/11/2007  . HYPERTHYROIDISM  07/14/2007  . HYPOTHYROIDISM 11/06/2007  . OSTEOARTHRITIS, KNEE, RIGHT 10/27/2008  . PERIPHERAL EDEMA 05/31/2009  . PULMONARY NODULE 06/24/2008   granuloma  . TIA (transient ischemic attack) 08/13/2015  . Type II or unspecified type diabetes mellitus without mention of complication, uncontrolled 08/22/2012   Patient states that he is not diabetic, not taking any medications  . Vitamin D deficiency 08/12/2015  . WOUND, LEG 05/31/2009   Past Surgical History:  Procedure Laterality Date  . ANTERIOR CERVICAL DECOMP/DISCECTOMY FUSION N/A 01/06/2013   Procedure: ANTERIOR CERVICAL DECOMPRESSION/DISCECTOMY FUSION 1 LEVEL;  Surgeon: Erline Levine, MD;  Location: [place] [facility];  Service: Neurosurgery;  Laterality: N/A;  Cervical three-four Anterior cervical decompression/diskectomy/fusion  . BACK SURGERY  1986   x 2  1986 C 3  . HERNIA REPAIR  sept 1986  . left knee replacement  Nov 21, 2007  . NECK SURGERY    . NISSEN FUNDOPLICATION  8891  . right heel bone spur  1987  . TOTAL KNEE ARTHROPLASTY  01/09/2012   Procedure: TOTAL KNEE ARTHROPLASTY;  Surgeon: Tobi Bastos, MD;  Location: [facility];  Service: Orthopedics;  Laterality: Right;    reports that he quit smoking about 36 years ago. His smoking use included cigarettes. He has a 90.00 pack-year smoking history. He has never used smokeless tobacco. He reports that he does not drink alcohol or use drugs. family history includes Emphysema in his father; Heart attack in his father and mother; Heart disease in an other family  member. Allergies  Allergen Reactions  . Codeine Other (See Comments)    Headaches   . Hct [Hydrochlorothiazide] Other (See Comments)    hypercalcemia  . Metformin And Related Other (See Comments)    Cannot tolerate in higher doses- has to drop from 2,000 mg daily to 1,500 mg daily to STOP the diarrhea that was occurring  . Naproxen Diarrhea  . Prednisone Diarrhea  . Zolpidem Tartrate Other (See Comments)    Hallucinations   .  Adhesive [Tape] Rash and Other (See Comments)    Redness, also (thinks he can tolerate paper tape)  . Penicillins Rash and Other (See Comments)    Welts Has patient had a PCN reaction causing immediate rash, facial/tongue/throat swelling, SOB or lightheadedness with hypotension: Yes Has patient had a PCN reaction causing severe rash involving mucus membranes or skin necrosis: No Has patient had a PCN reaction that required hospitalization: No Has patient had a PCN reaction occurring within the last 10 years: No If all of the above answers are "NO", then may proceed with Cephalosporin use.    Current Outpatient Medications on File Prior to Visit  Medication Sig Dispense Refill  . ACCU-CHEK AVIVA PLUS test strip USE TO CHECK BLOOD SUGARS 2 TIMES DAILY 100 strip 6  . ACCU-CHEK SOFTCLIX LANCETS lancets USE TO CHECK BLOOD SUGARS  TWO TIMES DAILY (Patient taking differently: 1 each 2 (two) times daily. ) 200 each 11  . aspirin EC 325 MG tablet Take 1 tablet (325 mg total) by mouth once as needed (when stroke-like symptoms present). 90 tablet 3  . Blood Glucose Monitoring Suppl (ACCU-CHEK AVIVA PLUS) w/Device KIT Use as directed 1 kit 0  . fenofibrate 160 MG tablet Take 1 tablet (160 mg total) by mouth daily. 90 tablet 3  . glucose blood (ONE TOUCH ULTRA TEST) test strip Use as instructed 100 each 12  . glucose blood test strip 1 each by Other route 2 (two) times daily. Use to check blood sugars twice a day Dx E11.9 300 each 2  . Potassium 99 MG TABS Take 1 tablet by mouth daily.     No current facility-administered medications on file prior to visit.    Review of Systems Constitutional: Negative for other unusual diaphoresis, sweats, appetite or weight changes HENT: Negative for other worsening hearing loss, ear pain, facial swelling, mouth sores or neck stiffness.   Eyes: Negative for other worsening pain, redness or other visual disturbance.  Respiratory: Negative for other stridor or  swelling Cardiovascular: Negative for other palpitations or other chest pain  Gastrointestinal: Negative for worsening diarrhea or loose stools, blood in stool, distention or other pain Genitourinary: Negative for hematuria, flank pain or other change in urine volume.  Musculoskeletal: Negative for myalgias or other joint swelling.  Skin: Negative for other color change, or other wound or worsening drainage.  Neurological: Negative for other syncope or numbness. Hematological: Negative for other adenopathy or swelling Psychiatric/Behavioral: Negative for hallucinations, other worsening agitation, SI, self-injury, or new decreased concentration All otherwise neg per pt    Objective:   Physical Exam BP 122/76   Pulse (!) 59   Temp 98.6 F (37 C) (Oral)   Ht 6' (1.829 m)   Wt 185 lb (83.9 kg)   SpO2 97%   BMI 25.09 kg/m  VS noted,  Constitutional: Pt is oriented to person, place, and time. Appears well-developed and well-nourished, in no significant distress and comfortable Head: Normocephalic and atraumatic  Eyes: Conjunctivae and EOM are  normal. Pupils are equal, round, and reactive to light Right Ear: External ear normal without discharge Left Ear: External ear normal without discharge Nose: Nose without discharge or deformity Mouth/Throat: Oropharynx is without other ulcerations and moist  Neck: Normal range of motion. Neck supple. No JVD present. No tracheal deviation present or significant neck LA or mass Cardiovascular: Normal rate, regular rhythm, normal heart sounds and intact distal pulses.   Pulmonary/Chest: WOB normal and breath sounds without rales or wheezing  Abdominal: Soft. Bowel sounds are normal. NT. No HSM  Musculoskeletal: Normal range of motion. Exhibits no edema, but has marked right trapezoid atrophy Lymphadenopathy: Has no other cervical adenopathy.  Neurological: Pt is alert and oriented to person, place, and time. Pt has normal reflexes. No cranial nerve  deficit. Motor grossly intact, Gait intact Skin: Skin is warm and dry. No rash noted or new ulcerations Psychiatric:  Has normal mood and affect. Behavior is normal without agitation All otherwise neg per pt Lab Results  Component Value Date   WBC 16.4 (H) 09/18/2018   HGB 13.2 09/18/2018   HCT 41.5 09/18/2018   PLT 281 09/18/2018   GLUCOSE 147 (H) 01/26/2019   CHOL 151 08/25/2018   TRIG 267.0 (H) 08/25/2018   HDL 33.10 (L) 08/25/2018   LDLDIRECT 84.0 08/25/2018   LDLCALC 43 08/15/2017   ALT 49 09/26/2018   AST 33 09/26/2018   NA 140 01/26/2019   K 4.0 01/26/2019   CL 105 01/26/2019   CREATININE 1.15 01/26/2019   BUN 16 01/26/2019   CO2 25 01/26/2019   TSH 2.12 01/12/2019   PSA 1.42 02/13/2018   INR 0.97 01/01/2012   HGBA1C 8.2 (H) 08/25/2018   MICROALBUR <0.7 02/13/2018      Assessment & Plan:

## 2019-08-31 NOTE — Assessment & Plan Note (Addendum)
Appears to be neuritic with right trapezoid atrophy, declines MRI c spine or EMG/NCS, for increased gabapentin 400 tid, change ibuprofen to celebrex bid prn

## 2019-08-31 NOTE — Assessment & Plan Note (Addendum)
Etiology unclear, for bnp, cxr, echo, consider PFT or pulm referral  In addition to the time spent performing CPE, I spent an additional 40 minutes face to face,in which greater than 50% of this time was spent in counseling and coordination of care for patient's acute illness as documented, including the differential dx, treatment, further evaluation and other management of dyspnea, DM, right upper back pain, wt loss, HTN

## 2019-08-31 NOTE — Patient Instructions (Addendum)
You had the flu shot today, and the Tdap tetanus shot today  OK to increase the gabapentin to 400 mg three times daily  OK to change the ibuprofen to celebrex twice per day  Please continue all other medications as before, and refills have been done if requested.  Please have the pharmacy call with any other refills you may need.  Please continue your efforts at being more active, low cholesterol diet, and weight control.  You are otherwise up to date with prevention measures today.  Please keep your appointments with your specialists as you may have planned  You will be contacted regarding the referral for: Echocardiogram  Please go to the XRAY Department in the Basement (go straight as you get off the elevator) for the x-ray testing  Please go to the LAB in the Basement (turn left off the elevator) for the tests to be done today  You will be contacted by phone if any changes need to be made immediately.  Otherwise, you will receive a letter about your results with an explanation, but please check with MyChart first.  Please remember to sign up for MyChart if you have not done so, as this will be important to you in the future with finding out test results, communicating by private email, and scheduling acute appointments online when needed.  Please return in 6 months, or sooner if needed, with Lab testing done 3-5 days before

## 2019-08-31 NOTE — Assessment & Plan Note (Signed)
Etiology unclear, for labs as ordered today, consider ct abd/pelvis

## 2019-08-31 NOTE — Assessment & Plan Note (Signed)
stable overall by history and exam, recent data reviewed with pt, and pt to continue medical treatment as before,  to f/u any worsening symptoms or concerns  

## 2019-08-31 NOTE — Assessment & Plan Note (Signed)
For f/u lab 

## 2019-08-31 NOTE — Assessment & Plan Note (Signed)

## 2019-09-01 ENCOUNTER — Other Ambulatory Visit: Payer: Self-pay | Admitting: Internal Medicine

## 2019-09-01 LAB — LIPID PANEL
Cholesterol: 122 mg/dL (ref 0–200)
HDL: 39.6 mg/dL (ref >39.00)
LDL Cholesterol: 46 mg/dL (ref 0–99)
NonHDL: 82.57
Total CHOL/HDL Ratio: 3
Triglycerides: 185 mg/dL — ABNORMAL HIGH (ref 0.0–149.0)
VLDL: 37 mg/dL (ref 0.0–40.0)

## 2019-09-01 LAB — HEPATIC FUNCTION PANEL
ALT: 17 U/L (ref 0–53)
AST: 17 U/L (ref 0–37)
Albumin: 4.7 g/dL (ref 3.5–5.2)
Alkaline Phosphatase: 42 U/L (ref 39–117)
Bilirubin, Direct: 0.1 mg/dL (ref 0.0–0.3)
Total Bilirubin: 0.3 mg/dL (ref 0.2–1.2)
Total Protein: 7.3 g/dL (ref 6.0–8.3)

## 2019-09-01 LAB — URINALYSIS, ROUTINE W REFLEX MICROSCOPIC
Bilirubin Urine: NEGATIVE
Hgb urine dipstick: NEGATIVE
Ketones, ur: NEGATIVE
Leukocytes,Ua: NEGATIVE
Nitrite: NEGATIVE
RBC / HPF: NONE SEEN (ref 0–?)
Specific Gravity, Urine: 1.01 (ref 1.000–1.030)
Total Protein, Urine: NEGATIVE
Urine Glucose: NEGATIVE
Urobilinogen, UA: 0.2 (ref 0.0–1.0)
pH: 7 (ref 5.0–8.0)

## 2019-09-01 LAB — BASIC METABOLIC PANEL
BUN: 15 mg/dL (ref 6–23)
CO2: 27 mEq/L (ref 19–32)
Calcium: 10.2 mg/dL (ref 8.4–10.5)
Chloride: 105 mEq/L (ref 96–112)
Creatinine, Ser: 1.03 mg/dL (ref 0.40–1.50)
GFR: 70.31 mL/min (ref >60.00)
Glucose, Bld: 101 mg/dL — ABNORMAL HIGH (ref 70–99)
Potassium: 4.3 mEq/L (ref 3.5–5.1)
Sodium: 141 mEq/L (ref 135–145)

## 2019-09-01 LAB — MICROALBUMIN / CREATININE URINE RATIO
Creatinine,U: 52.6 mg/dL
Microalb Creat Ratio: 5.2 mg/g (ref 0.0–30.0)
Microalb, Ur: 2.7 mg/dL — ABNORMAL HIGH (ref 0.0–1.9)

## 2019-09-01 LAB — IBC PANEL
Iron: 49 ug/dL (ref 42–165)
Saturation Ratios: 10 % — ABNORMAL LOW (ref 20.0–50.0)
Transferrin: 349 mg/dL (ref 212.0–360.0)

## 2019-09-01 LAB — PSA: PSA: 1.3 ng/mL (ref 0.10–4.00)

## 2019-09-01 LAB — PTH, INTACT AND CALCIUM
Calcium: 10.1 mg/dL (ref 8.6–10.3)
PTH: 8 pg/mL — ABNORMAL LOW (ref 14–64)

## 2019-09-01 LAB — VITAMIN D 25 HYDROXY (VIT D DEFICIENCY, FRACTURES): VITD: 54.05 ng/mL (ref 30.00–100.00)

## 2019-09-01 LAB — HEMOGLOBIN A1C: Hgb A1c MFr Bld: 6.8 % — ABNORMAL HIGH (ref 4.6–6.5)

## 2019-09-01 LAB — TSH: TSH: 10.58 u[IU]/mL — ABNORMAL HIGH (ref 0.35–4.50)

## 2019-09-01 LAB — VITAMIN B12: Vitamin B-12: 294 pg/mL (ref 211–911)

## 2019-09-01 MED ORDER — LEVOTHYROXINE SODIUM 137 MCG PO TABS: 137.0000 ug | ORAL_TABLET | Freq: Every day | ORAL | 3 refills | Status: DC

## 2019-09-02 ENCOUNTER — Telehealth: Payer: Self-pay | Admitting: Endocrinology

## 2019-09-02 NOTE — Telephone Encounter (Signed)
Completed.

## 2019-09-02 NOTE — Telephone Encounter (Signed)
-----   Message from Aleatha Borer, LPN sent at [id number]  7:22 AM EDT ----- Per Dr. Cordelia Pen request, please call pt to schedule appt

## 2019-09-03 ENCOUNTER — Telehealth: Payer: Self-pay | Admitting: Endocrinology

## 2019-09-03 ENCOUNTER — Other Ambulatory Visit: Payer: Self-pay

## 2019-09-03 ENCOUNTER — Encounter: Payer: Self-pay | Admitting: Endocrinology

## 2019-09-03 ENCOUNTER — Ambulatory Visit (INDEPENDENT_AMBULATORY_CARE_PROVIDER_SITE_OTHER): Payer: Medicare Other | Admitting: Endocrinology

## 2019-09-03 ENCOUNTER — Ambulatory Visit (INDEPENDENT_AMBULATORY_CARE_PROVIDER_SITE_OTHER)
Admission: RE | Admit: 2019-09-03 | Discharge: 2019-09-03 | Disposition: A | Discharge status: Home / Self Care | Payer: Medicare Other | Source: Ambulatory Visit | Attending: Internal Medicine | Admitting: Internal Medicine

## 2019-09-03 DIAGNOSIS — R0600 Dyspnea, unspecified: Secondary | ICD-10-CM | POA: Diagnosis not present

## 2019-09-03 DIAGNOSIS — R0602 Shortness of breath: Secondary | ICD-10-CM | POA: Diagnosis not present

## 2019-09-03 NOTE — Progress Notes (Signed)
° °Subjective:  ° ° Patient ID: [name], male    DOB: 04/10/1946, 73 y.o.   MRN: [id number] ° °HPI °Pt returns for f/u of hypercalcemia (dx'ed 2018 (it was normal in 2017); only cause found was HCTZ; 24-HR urine Ca++ was 336 in 2016; PTH and PTHrP were low; he has never had urolithiasis; he stopped Vit-D supplement in 2/20, due to elev level; calcium normalized in early 2020 with d/c HCTZ).  pt states he feels well in general.  He has not recently taken vit-D.   °Past Medical History:  °Diagnosis Date  °• ALLERGIC RHINITIS 11/06/2007  °• Anemia, unspecified 08/22/2012  °• BENIGN PROSTATIC HYPERTROPHY 07/14/2007  °• COLONIC POLYPS, HX OF 07/14/2007  °• Complication of anesthesia   ° pt states he needs to be cathed after surgery  °• Degenerative arthritis of right knee 12/12/2011  °• GERD 07/11/2007  °• Headache(784.0) 07/11/2007  °• HYPERLIPIDEMIA 07/11/2007  °• HYPERTENSION 07/11/2007  °• HYPERTHYROIDISM 07/14/2007  °• HYPOTHYROIDISM 11/06/2007  °• OSTEOARTHRITIS, KNEE, RIGHT 10/27/2008  °• PERIPHERAL EDEMA 05/31/2009  °• PULMONARY NODULE 06/24/2008  ° granuloma  °• TIA (transient ischemic attack) 08/13/2015  °• Type II or unspecified type diabetes mellitus without mention of complication, uncontrolled 08/22/2012  ° Patient states that he is not diabetic, not taking any medications  °• Vitamin D deficiency 08/12/2015  °• WOUND, LEG 05/31/2009  ° ° °Past Surgical History:  °Procedure Laterality Date  °• ANTERIOR CERVICAL DECOMP/DISCECTOMY FUSION N/A 01/06/2013  ° Procedure: ANTERIOR CERVICAL DECOMPRESSION/DISCECTOMY FUSION 1 LEVEL;  Surgeon: Joseph Stern, MD;  Location: [facility];  Service: Neurosurgery;  Laterality: N/A;  Cervical three-four Anterior cervical decompression/diskectomy/fusion  °• BACK SURGERY  1986  ° x 2  1986 C 3  °• HERNIA REPAIR  sept 1986  °• left knee replacement  Nov 21, 2007  °• NECK SURGERY    °• NISSEN FUNDOPLICATION  2000  °• right heel bone spur  1987  °• TOTAL KNEE ARTHROPLASTY  01/09/2012  ° Procedure: TOTAL KNEE ARTHROPLASTY;  Surgeon: Ronald A Gioffre, MD;  Location: [facility];  Service: Orthopedics;  Laterality: Right;  ° ° °Social History  ° °Socioeconomic History  °• Marital status: Married  °  Spouse name: Carolyn  °• Number of children: 2  °• Years of education: HS  °• Highest education level: Not on file  °Occupational History  °• Occupation: Retired  °Social Needs  °• Financial resource strain: Not on file  °• Food insecurity  °  Worry: Not on file  °  Inability: Not on file  °• Transportation needs  °  Medical: Not on file  °  Non-medical: Not on file  °Tobacco Use  °• Smoking status: Former Smoker  °  Packs/day: 3.00  °  Years: 30.00  °  Pack years: 90.00  °  Types: Cigarettes  °  Quit date: 11/26/1982  °  Years since quitting: 36.8  °• Smokeless tobacco: Never Used  °Substance and Sexual Activity  °• Alcohol use: No  °• Drug use: No  °• Sexual activity: Not on file  °Lifestyle  °• Physical activity  °  Days per week: Not on file  °  Minutes per session: Not on file  °• Stress: Not on file  °Relationships  °• Social connections  °  Talks on phone: Not on file  °  Gets together: Not on file  °  Attends religious service: Not on file  °  Active member of club or   or organization: Not on file    Attends meetings of clubs or organizations: Not on file    Relationship status: Not on file   Intimate partner violence    Fear of current or ex partner: Not on file    Emotionally abused: Not on file    Physically abused: Not on file    Forced sexual activity: Not on file  Other Topics Concern   Not on file  Social History Narrative   Patient lives at home with spouse.   Caffiene Use: 4 cups daily    Current Outpatient Medications on File Prior to Visit  Medication Sig Dispense Refill   ACCU-CHEK AVIVA PLUS test strip USE TO CHECK BLOOD SUGARS 2 TIMES DAILY 100 strip 6   ACCU-CHEK SOFTCLIX LANCETS lancets USE TO CHECK BLOOD SUGARS  TWO TIMES DAILY (Patient taking differently: 1 each 2 (two)  times daily. ) 200 each 11   amLODipine (NORVASC) 5 MG tablet Take 1 tablet (5 mg total) by mouth daily. 90 tablet 3   aspirin EC 325 MG tablet Take 1 tablet (325 mg total) by mouth once as needed (when stroke-like symptoms present). 90 tablet 3   atorvastatin (LIPITOR) 80 MG tablet Take 1 tablet (80 mg total) by mouth daily. 90 tablet 3   Blood Glucose Monitoring Suppl (ACCU-CHEK AVIVA PLUS) w/Device KIT Use as directed 1 kit 0   celecoxib (CELEBREX) 200 MG capsule Take 1 capsule (200 mg total) by mouth 2 (two) times daily. 180 capsule 3   cetirizine (ZYRTEC) 10 MG tablet Take 1 tablet (10 mg total) by mouth daily. 90 tablet 3   clopidogrel (PLAVIX) 75 MG tablet Take 1 tablet (75 mg total) by mouth daily. 90 tablet 3   fenofibrate 160 MG tablet Take 1 tablet (160 mg total) by mouth daily. 90 tablet 3   gabapentin (NEURONTIN) 400 MG capsule Take 1 capsule (400 mg total) by mouth 3 (three) times daily. 270 capsule 1   glucose blood (ONE TOUCH ULTRA TEST) test strip Use as instructed 100 each 12   glucose blood test strip 1 each by Other route 2 (two) times daily. Use to check blood sugars twice a day Dx E11.9 300 each 2   labetalol (NORMODYNE) 300 MG tablet Take 1 tablet (300 mg total) by mouth 2 (two) times daily. 180 tablet 3   levothyroxine (SYNTHROID) 137 MCG tablet Take 1 tablet (137 mcg total) by mouth daily before breakfast. 90 tablet 3   metFORMIN (GLUCOPHAGE-XR) 500 MG 24 hr tablet Take 4 tablets (2,000 mg total) by mouth daily with breakfast. 360 tablet 3   Potassium 99 MG TABS Take 1 tablet by mouth daily.     No current facility-administered medications on file prior to visit.     Allergies  Allergen Reactions   Codeine Other (See Comments)    Headaches    Hct [Hydrochlorothiazide] Other (See Comments)    hypercalcemia   Metformin And Related Other (See Comments)    Cannot tolerate in higher doses- has to drop from 2,000 mg daily to 1,500 mg daily to STOP the  diarrhea that was occurring   Naproxen Diarrhea   Prednisone Diarrhea   Zolpidem Tartrate Other (See Comments)    Hallucinations    Adhesive [Tape] Rash and Other (See Comments)    Redness, also (thinks he can tolerate paper tape)   Penicillins Rash and Other (See Comments)    Welts Has patient had a PCN reaction causing immediate rash, facial/tongue/throat  swelling, SOB or lightheadedness with hypotension: Yes Has patient had a PCN reaction causing severe rash involving mucus membranes or skin necrosis: No Has patient had a PCN reaction that required hospitalization: No Has patient had a PCN reaction occurring within the last 10 years: No If all of the above answers are "NO", then may proceed with Cephalosporin use.     Family History  Problem Relation Age of Onset   Heart attack Mother    Heart attack Father    Emphysema Father    Heart disease Other    Diabetes Neg Hx    Thyroid disease Neg Hx    Hypercalcemia Neg Hx     BP 134/68 (BP Location: Left Arm, Patient Position: Sitting, Cuff Size: Normal)    Pulse 66    Ht 6' (1.829 m)    Wt 184 lb 12.8 oz (83.8 kg)    SpO2 97%    BMI 25.06 kg/m    Review of Systems Denies numbness.     Objective:   Physical Exam VITAL SIGNS:  See vs page GENERAL: no distress EXT: no leg edema GAIT: steady with a cane.     Lab Results  Component Value Date   PTH 8 (L) 08/31/2019   CALCIUM 10.2 08/31/2019   CALCIUM 10.1 08/31/2019   CAION 5.7 (H) 09/12/2017   PHOS 3.5 10/07/2015       Assessment & Plan:  Hypercalcemia, better. Vit-D def: recheck today  Patient Instructions  Although your calcium is better, it could go high again, so you should have it checked at least once a year.  i'll let you know when we get the vitamin-D result.   I would be happy to see you back here as needed.

## 2019-09-03 NOTE — Telephone Encounter (Signed)
LVM requesting returned call 

## 2019-09-03 NOTE — Telephone Encounter (Signed)
please contact patient: vitamin-D is normal, so you don't need to take any--good.

## 2019-09-03 NOTE — Patient Instructions (Addendum)
Although your calcium is better, it could go high again, so you should have it checked at least once a year.  i'll let you know when we get the vitamin-D result.   I would be happy to see you back here as needed.

## 2019-09-04 ENCOUNTER — Encounter (HOSPITAL_COMMUNITY): Payer: Self-pay | Admitting: Internal Medicine

## 2019-09-04 NOTE — Telephone Encounter (Signed)
Called pt and informed of Dr. Ellison's advice. Verbalized acceptance and understanding. 

## 2019-09-14 ENCOUNTER — Telehealth (HOSPITAL_COMMUNITY): Payer: Self-pay

## 2019-09-14 NOTE — Telephone Encounter (Signed)
New message    Just an FYI. We have made several attempts to contact this patient including sending a letter to schedule or reschedule their echocardiogram. We will be removing the patient from the echo [place].   10.19.20 @ 11:03am lm on home vm - Willia Lampert  10.9.20 mail reminder letter Keonda Dow  10.6.20 @ 3:15pm lm on home vm  - Ha Shannahan

## 2019-09-17 ENCOUNTER — Ambulatory Visit (HOSPITAL_COMMUNITY): Payer: Medicare Other | Attending: Cardiology

## 2019-09-17 ENCOUNTER — Other Ambulatory Visit: Payer: Self-pay

## 2019-09-17 DIAGNOSIS — R0600 Dyspnea, unspecified: Secondary | ICD-10-CM | POA: Diagnosis not present

## 2019-10-12 ENCOUNTER — Other Ambulatory Visit: Payer: Self-pay | Admitting: Internal Medicine

## 2020-01-10 ENCOUNTER — Other Ambulatory Visit: Payer: Self-pay | Admitting: Internal Medicine

## 2020-02-10 ENCOUNTER — Other Ambulatory Visit: Payer: Self-pay | Admitting: Internal Medicine

## 2020-02-10 NOTE — Telephone Encounter (Signed)
Please refill as per office routine med refill policy (all routine meds refilled for 3 mo or monthly per pt preference up to one year from last visit, then month to month grace period for 3 mo, then further med refills will have to be denied)  

## 2020-02-22 ENCOUNTER — Other Ambulatory Visit (INDEPENDENT_AMBULATORY_CARE_PROVIDER_SITE_OTHER): Payer: Medicare Other

## 2020-02-22 DIAGNOSIS — Z0001 Encounter for general adult medical examination with abnormal findings: Secondary | ICD-10-CM | POA: Diagnosis not present

## 2020-02-22 LAB — BASIC METABOLIC PANEL
BUN: 14 mg/dL (ref 6–23)
CO2: 26 mEq/L (ref 19–32)
Calcium: 9.8 mg/dL (ref 8.4–10.5)
Chloride: 106 mEq/L (ref 96–112)
Creatinine, Ser: 1.06 mg/dL (ref 0.40–1.50)
GFR: 67.93 mL/min (ref >60.00)
Glucose, Bld: 126 mg/dL — ABNORMAL HIGH (ref 70–99)
Potassium: 3.7 mEq/L (ref 3.5–5.1)
Sodium: 142 mEq/L (ref 135–145)

## 2020-02-22 LAB — HEPATIC FUNCTION PANEL
ALT: 11 U/L (ref 0–53)
AST: 13 U/L (ref 0–37)
Albumin: 4.5 g/dL (ref 3.5–5.2)
Alkaline Phosphatase: 38 U/L — ABNORMAL LOW (ref 39–117)
Bilirubin, Direct: 0.1 mg/dL (ref 0.0–0.3)
Total Bilirubin: 0.5 mg/dL (ref 0.2–1.2)
Total Protein: 7.2 g/dL (ref 6.0–8.3)

## 2020-02-22 LAB — LIPID PANEL
Cholesterol: 113 mg/dL (ref 0–200)
HDL: 36 mg/dL — ABNORMAL LOW (ref >39.00)
LDL Cholesterol: 42 mg/dL (ref 0–99)
NonHDL: 76.58
Total CHOL/HDL Ratio: 3
Triglycerides: 173 mg/dL — ABNORMAL HIGH (ref 0.0–149.0)
VLDL: 34.6 mg/dL (ref 0.0–40.0)

## 2020-02-22 LAB — HEMOGLOBIN A1C: Hgb A1c MFr Bld: 6.8 % — ABNORMAL HIGH (ref 4.6–6.5)

## 2020-02-29 ENCOUNTER — Other Ambulatory Visit: Payer: Self-pay

## 2020-02-29 ENCOUNTER — Ambulatory Visit (INDEPENDENT_AMBULATORY_CARE_PROVIDER_SITE_OTHER): Payer: Medicare Other | Admitting: Internal Medicine

## 2020-02-29 ENCOUNTER — Encounter: Payer: Self-pay | Admitting: Internal Medicine

## 2020-02-29 VITALS — BP 132/70 | HR 58 | Temp 98.2°F | Ht 72.0 in | Wt 189.2 lb

## 2020-02-29 DIAGNOSIS — Z0000 Encounter for general adult medical examination without abnormal findings: Secondary | ICD-10-CM

## 2020-02-29 DIAGNOSIS — E119 Type 2 diabetes mellitus without complications: Secondary | ICD-10-CM

## 2020-02-29 DIAGNOSIS — E039 Hypothyroidism, unspecified: Secondary | ICD-10-CM | POA: Diagnosis not present

## 2020-02-29 DIAGNOSIS — R269 Unspecified abnormalities of gait and mobility: Secondary | ICD-10-CM | POA: Diagnosis not present

## 2020-02-29 DIAGNOSIS — M4802 Spinal stenosis, cervical region: Secondary | ICD-10-CM

## 2020-02-29 DIAGNOSIS — E785 Hyperlipidemia, unspecified: Secondary | ICD-10-CM | POA: Diagnosis not present

## 2020-02-29 DIAGNOSIS — Z8673 Personal history of transient ischemic attack (TIA), and cerebral infarction without residual deficits: Secondary | ICD-10-CM

## 2020-02-29 DIAGNOSIS — I10 Essential (primary) hypertension: Secondary | ICD-10-CM | POA: Diagnosis not present

## 2020-02-29 DIAGNOSIS — I6381 Other cerebral infarction due to occlusion or stenosis of small artery: Secondary | ICD-10-CM

## 2020-02-29 LAB — TSH: TSH: 8.79 u[IU]/mL — ABNORMAL HIGH (ref 0.35–4.50)

## 2020-02-29 LAB — T4, FREE: Free T4: 0.57 ng/dL — ABNORMAL LOW (ref 0.60–1.60)

## 2020-02-29 MED ORDER — LANCETS MISC: 11 refills | Status: DC

## 2020-02-29 MED ORDER — ACCU-CHEK GUIDE W/DEVICE KIT: 1.0000 | PACK | Freq: Every day | 0 refills | Status: DC

## 2020-02-29 MED ORDER — ACCU-CHEK GUIDE VI STRP: ORAL_STRIP | 12 refills | Status: DC

## 2020-02-29 NOTE — Progress Notes (Addendum)
Subjective:    Patient ID: [name], male    DOB: July 20, 1946, 74 y.o.   MRN: [id number]  HPI Here to f/u; overall doing ok,  Pt denies chest pain, increasing sob or doe, wheezing, orthopnea, PND, increased LE swelling, palpitations, dizziness or syncope.  Pt denies new neurological symptoms such as new headache, or facial or extremity weakness or numbness.  Pt denies polydipsia, polyuria, or low sugar episode.  Pt states overall good compliance with meds, mostly trying to follow appropriate diet, with wt overall stable,  but little exercise however.  celebrex no help, not taking,  Also need Dm meter and supplies.  Denies hyper or hypo thyroid symptoms such as voice, skin or hair change.  Past Medical History:  Diagnosis Date  . ALLERGIC RHINITIS 11/06/2007  . Anemia, unspecified 08/22/2012  . BENIGN PROSTATIC HYPERTROPHY 07/14/2007  . COLONIC POLYPS, HX OF 07/14/2007  . Complication of anesthesia    pt states he needs to be cathed after surgery  . Degenerative arthritis of right knee 12/12/2011  . GERD 07/11/2007  . Headache(784.0) 07/11/2007  . HYPERLIPIDEMIA 07/11/2007  . HYPERTENSION 07/11/2007  . HYPERTHYROIDISM 07/14/2007  . HYPOTHYROIDISM 11/06/2007  . OSTEOARTHRITIS, KNEE, RIGHT 10/27/2008  . PERIPHERAL EDEMA 05/31/2009  . PULMONARY NODULE 06/24/2008   granuloma  . TIA (transient ischemic attack) 08/13/2015  . Type II or unspecified type diabetes mellitus without mention of complication, uncontrolled 08/22/2012   Patient states that he is not diabetic, not taking any medications  . Vitamin D deficiency 08/12/2015  . WOUND, LEG 05/31/2009   Past Surgical History:  Procedure Laterality Date  . ANTERIOR CERVICAL DECOMP/DISCECTOMY FUSION N/A 01/06/2013   Procedure: ANTERIOR CERVICAL DECOMPRESSION/DISCECTOMY FUSION 1 LEVEL;  Surgeon: Erline Levine, MD;  Location: [facility];  Service: Neurosurgery;  Laterality: N/A;  Cervical three-four Anterior cervical decompression/diskectomy/fusion  .  BACK SURGERY  1986   x 2  1986 C 3  . HERNIA REPAIR  sept 1986  . left knee replacement  Nov 21, 2007  . NECK SURGERY    . NISSEN FUNDOPLICATION  [id number]  . right heel bone spur  1987  . TOTAL KNEE ARTHROPLASTY  01/09/2012   Procedure: TOTAL KNEE ARTHROPLASTY;  Surgeon: Tobi Bastos, MD;  Location: [facility];  Service: Orthopedics;  Laterality: Right;    reports that he quit smoking about 37 years ago. His smoking use included cigarettes. He has a 90.00 pack-year smoking history. He has never used smokeless tobacco. He reports that he does not drink alcohol or use drugs. family history includes Emphysema in his father; Heart attack in his father and mother; Heart disease in an other family member. Allergies  Allergen Reactions  . Codeine Other (See Comments)    Headaches   . Hct [Hydrochlorothiazide] Other (See Comments)    hypercalcemia  . Metformin And Related Other (See Comments)    Cannot tolerate in higher doses- has to drop from 2,000 mg daily to 1,500 mg daily to STOP the diarrhea that was occurring  . Naproxen Diarrhea  . Prednisone Diarrhea  . Zolpidem Tartrate Other (See Comments)    Hallucinations   . Adhesive [Tape] Rash and Other (See Comments)    Redness, also (thinks he can tolerate paper tape)  . Penicillins Rash and Other (See Comments)    Welts Has patient had a PCN reaction causing immediate rash, facial/tongue/throat swelling, SOB or lightheadedness with hypotension: Yes Has patient had a PCN reaction causing severe rash involving mucus membranes  or skin necrosis: No Has patient had a PCN reaction that required hospitalization: No Has patient had a PCN reaction occurring within the last 10 years: No If all of the above answers are "NO", then may proceed with Cephalosporin use.    Current Outpatient Medications on File Prior to Visit  Medication Sig Dispense Refill  . amLODipine (NORVASC) 5 MG tablet Take 1 tablet (5 mg total) by mouth daily. 90 tablet 3  .  aspirin EC 325 MG tablet Take 1 tablet (325 mg total) by mouth once as needed (when stroke-like symptoms present). 90 tablet 3  . atorvastatin (LIPITOR) 80 MG tablet Take 1 tablet (80 mg total) by mouth daily. 90 tablet 3  . cetirizine (ZYRTEC) 10 MG tablet Take 1 tablet (10 mg total) by mouth daily. 90 tablet 3  . clopidogrel (PLAVIX) 75 MG tablet Take 1 tablet (75 mg total) by mouth daily. 90 tablet 3  . fenofibrate 160 MG tablet TAKE 1 TABLET BY MOUTH  DAILY 90 tablet 3  . gabapentin (NEURONTIN) 400 MG capsule TAKE 1 CAPSULE BY MOUTH 3  TIMES DAILY 270 capsule 2  . glucose blood test strip 1 each by Other route 2 (two) times daily. Use to check blood sugars twice a day Dx E11.9 300 each 2  . labetalol (NORMODYNE) 300 MG tablet Take 1 tablet (300 mg total) by mouth 2 (two) times daily. 180 tablet 3  . metFORMIN (GLUCOPHAGE-XR) 500 MG 24 hr tablet Take 4 tablets (2,000 mg total) by mouth daily with breakfast. 360 tablet 3  . Potassium 99 MG TABS Take 1 tablet by mouth daily.     No current facility-administered medications on file prior to visit.   Review of Systems All otherwise neg per pt    Objective:   Physical Exam BP 132/70   Pulse (!) 58   Temp 98.2 F (36.8 C)   Ht 6' (1.829 m)   Wt 189 lb 3.2 oz (85.8 kg)   SpO2 99%   BMI 25.66 kg/m  VS noted,  Constitutional: Pt appears in NAD HENT: Head: NCAT.  Right Ear: External ear normal.  Left Ear: External ear normal.  Eyes: . Pupils are equal, round, and reactive to light. Conjunctivae and EOM are normal Nose: without d/c or deformity Neck: Neck supple. Gross normal ROM Cardiovascular: Normal rate and regular rhythm.   Pulmonary/Chest: Effort normal and breath sounds without rales or wheezing.  Abd:  Soft, NT, ND, + BS, no organomegaly Neurological: Pt is alert. At baseline orientation, has moderate gait disorder Skin: Skin is warm. No rashes, other new lesions, no LE edema Psychiatric: Pt behavior is normal without  agitation  All otherwise neg per pt Lab Results  Component Value Date   WBC 8.8 08/31/2019   HGB 13.2 08/31/2019   HCT 39.8 08/31/2019   PLT 295.0 08/31/2019   GLUCOSE 126 (H) 02/22/2020   CHOL 113 02/22/2020   TRIG 173.0 (H) 02/22/2020   HDL 36.00 (L) 02/22/2020   LDLDIRECT 84.0 08/25/2018   LDLCALC 42 02/22/2020   ALT 11 02/22/2020   AST 13 02/22/2020   NA 142 02/22/2020   K 3.7 02/22/2020   CL 106 02/22/2020   CREATININE 1.06 02/22/2020   BUN 14 02/22/2020   CO2 26 02/22/2020   TSH 8.79 (H) 02/29/2020   PSA 1.30 08/31/2019   INR 0.97 01/01/2012   HGBA1C 6.8 (H) 02/22/2020   MICROALBUR 2.7 (H) 08/31/2019       Assessment & Plan:

## 2020-02-29 NOTE — Patient Instructions (Addendum)
Please continue all other medications as before, and refills have been done if requested  - the meter and supplies  Please have the pharmacy call with any other refills you may need.  Please continue your efforts at being more active, low cholesterol diet, and weight control.  You are otherwise up to date with prevention measures today.  Please keep your appointments with your specialists as you may have planned  You are given the order for the Lift chair prescriptoin to take to the medical supply store  Please go to the LAB at the blood drawing area for the tests to be done - just the thyroid testing today You will be contacted by phone if any changes need to be made immediately.  Otherwise, you will receive a letter about your results with an explanation, but please check with MyChart first.  Please remember to sign up for MyChart if you have not done so, as this will be important to you in the future with finding out test results, communicating by private email, and scheduling acute appointments online when needed.  Please make an Appointment to return in 6 months, or sooner if needed, also with Lab Appointment for testing done 3-5 days before at the [facility] (so this is for TWO appointments - please see the scheduling desk as you leave)

## 2020-03-01 ENCOUNTER — Other Ambulatory Visit: Payer: Self-pay | Admitting: Internal Medicine

## 2020-03-01 MED ORDER — LEVOTHYROXINE SODIUM 150 MCG PO TABS: 150.0000 ug | ORAL_TABLET | Freq: Every day | ORAL | 3 refills | Status: DC

## 2020-03-03 ENCOUNTER — Encounter: Payer: Self-pay | Admitting: Internal Medicine

## 2020-03-03 NOTE — Assessment & Plan Note (Signed)

## 2020-03-04 ENCOUNTER — Telehealth: Payer: Self-pay | Admitting: Internal Medicine

## 2020-03-04 NOTE — Telephone Encounter (Signed)
New message:   [name] from [facility] is calling and states to please disregard the faxes sent on 03/02/20 and 03/03/20 that they will fill the levothyroxine. Ref# [id number] if you have any further questions.

## 2020-03-07 ENCOUNTER — Telehealth: Payer: Self-pay

## 2020-03-07 MED ORDER — LEVOTHYROXINE SODIUM 150 MCG PO TABS: 150.0000 ug | ORAL_TABLET | Freq: Every day | ORAL | 3 refills | Status: DC

## 2020-03-07 NOTE — Telephone Encounter (Signed)
Called to [facility]: Gave okay to keep the levothyroxin with the same manufacturer: [employer].

## 2020-03-07 NOTE — Telephone Encounter (Signed)
New message  1.Medication Requested:levothyroxine (SYNTHROID) 150 MCG tablet  Pharmacy has some questions regarding medication.   Ref # [id number]

## 2020-05-16 ENCOUNTER — Telehealth: Payer: Self-pay

## 2020-05-16 DIAGNOSIS — M545 Low back pain: Secondary | ICD-10-CM | POA: Diagnosis not present

## 2020-05-16 DIAGNOSIS — M542 Cervicalgia: Secondary | ICD-10-CM | POA: Diagnosis not present

## 2020-05-16 NOTE — Telephone Encounter (Signed)
New message   What type of DME ([facility]) would like your provider to order? Lift chair   Who would like to get the DME from?   --Need Clinical notes on gait disorders and CVA  to go along with the rx  - written on  4.5.21   --Company name in [place]. [address] phone # [phone] fax # [phone]   --Attention : [place]   Last visit with PCP (>3 months requires and appointment for insurance to cover the cost = please schedule patient for visit to discuss medical necessity for DME)? 4.5.2021

## 2020-05-20 NOTE — Assessment & Plan Note (Signed)
stable overall by history and exam, recent data reviewed with pt, and pt to continue medical treatment as before,  to f/u any worsening symptoms or concerns  

## 2020-05-20 NOTE — Telephone Encounter (Signed)
Addendum done

## 2020-05-20 NOTE — Assessment & Plan Note (Addendum)
With hx of lacunar infarct and disequilibrium, stable overall by history and exam, recent data reviewed with pt, and pt to continue medical treatment as before,  to f/u any worsening symptoms or concerns, but needs DME, would benefit from lift chair

## 2020-05-20 NOTE — Telephone Encounter (Signed)
Faxed addendum to 02/29/2020 OV notes to [place] at [phone]

## 2020-06-07 DIAGNOSIS — M4722 Other spondylosis with radiculopathy, cervical region: Secondary | ICD-10-CM | POA: Diagnosis not present

## 2020-06-07 DIAGNOSIS — M9903 Segmental and somatic dysfunction of lumbar region: Secondary | ICD-10-CM | POA: Diagnosis not present

## 2020-06-07 DIAGNOSIS — M4726 Other spondylosis with radiculopathy, lumbar region: Secondary | ICD-10-CM | POA: Diagnosis not present

## 2020-06-07 DIAGNOSIS — M9901 Segmental and somatic dysfunction of cervical region: Secondary | ICD-10-CM | POA: Diagnosis not present

## 2020-06-21 DIAGNOSIS — M4722 Other spondylosis with radiculopathy, cervical region: Secondary | ICD-10-CM | POA: Diagnosis not present

## 2020-06-21 DIAGNOSIS — M9901 Segmental and somatic dysfunction of cervical region: Secondary | ICD-10-CM | POA: Diagnosis not present

## 2020-06-21 DIAGNOSIS — M9903 Segmental and somatic dysfunction of lumbar region: Secondary | ICD-10-CM | POA: Diagnosis not present

## 2020-06-21 DIAGNOSIS — M4726 Other spondylosis with radiculopathy, lumbar region: Secondary | ICD-10-CM | POA: Diagnosis not present

## 2020-07-05 DIAGNOSIS — M4722 Other spondylosis with radiculopathy, cervical region: Secondary | ICD-10-CM | POA: Diagnosis not present

## 2020-07-05 DIAGNOSIS — M4726 Other spondylosis with radiculopathy, lumbar region: Secondary | ICD-10-CM | POA: Diagnosis not present

## 2020-07-05 DIAGNOSIS — M9901 Segmental and somatic dysfunction of cervical region: Secondary | ICD-10-CM | POA: Diagnosis not present

## 2020-07-05 DIAGNOSIS — M9903 Segmental and somatic dysfunction of lumbar region: Secondary | ICD-10-CM | POA: Diagnosis not present

## 2020-07-11 DIAGNOSIS — M9903 Segmental and somatic dysfunction of lumbar region: Secondary | ICD-10-CM | POA: Diagnosis not present

## 2020-07-11 DIAGNOSIS — M9901 Segmental and somatic dysfunction of cervical region: Secondary | ICD-10-CM | POA: Diagnosis not present

## 2020-07-11 DIAGNOSIS — M4726 Other spondylosis with radiculopathy, lumbar region: Secondary | ICD-10-CM | POA: Diagnosis not present

## 2020-07-11 DIAGNOSIS — M4722 Other spondylosis with radiculopathy, cervical region: Secondary | ICD-10-CM | POA: Diagnosis not present

## 2020-07-19 DIAGNOSIS — I10 Essential (primary) hypertension: Secondary | ICD-10-CM | POA: Diagnosis not present

## 2020-07-19 DIAGNOSIS — E039 Hypothyroidism, unspecified: Secondary | ICD-10-CM | POA: Diagnosis not present

## 2020-07-19 DIAGNOSIS — Z8673 Personal history of transient ischemic attack (TIA), and cerebral infarction without residual deficits: Secondary | ICD-10-CM | POA: Diagnosis not present

## 2020-07-19 DIAGNOSIS — E785 Hyperlipidemia, unspecified: Secondary | ICD-10-CM | POA: Diagnosis not present

## 2020-07-19 DIAGNOSIS — E118 Type 2 diabetes mellitus with unspecified complications: Secondary | ICD-10-CM | POA: Diagnosis not present

## 2020-07-19 DIAGNOSIS — Z79899 Other long term (current) drug therapy: Secondary | ICD-10-CM | POA: Diagnosis not present

## 2020-07-21 DIAGNOSIS — M4722 Other spondylosis with radiculopathy, cervical region: Secondary | ICD-10-CM | POA: Diagnosis not present

## 2020-07-21 DIAGNOSIS — M9901 Segmental and somatic dysfunction of cervical region: Secondary | ICD-10-CM | POA: Diagnosis not present

## 2020-07-21 DIAGNOSIS — M4726 Other spondylosis with radiculopathy, lumbar region: Secondary | ICD-10-CM | POA: Diagnosis not present

## 2020-07-21 DIAGNOSIS — M9903 Segmental and somatic dysfunction of lumbar region: Secondary | ICD-10-CM | POA: Diagnosis not present

## 2020-07-25 ENCOUNTER — Other Ambulatory Visit: Payer: Self-pay | Admitting: Internal Medicine

## 2020-08-01 IMAGING — DX DG CHEST 2V
2 series · 2 of 2 positions shown · non-contrast
Comparison: 09/18/2018 and 05/31/2009

CLINICAL DATA: Chronic shortness of breath with recent worsening.
Ex-smoker.

EXAM:
CHEST - 2 VIEW

[chest pa]
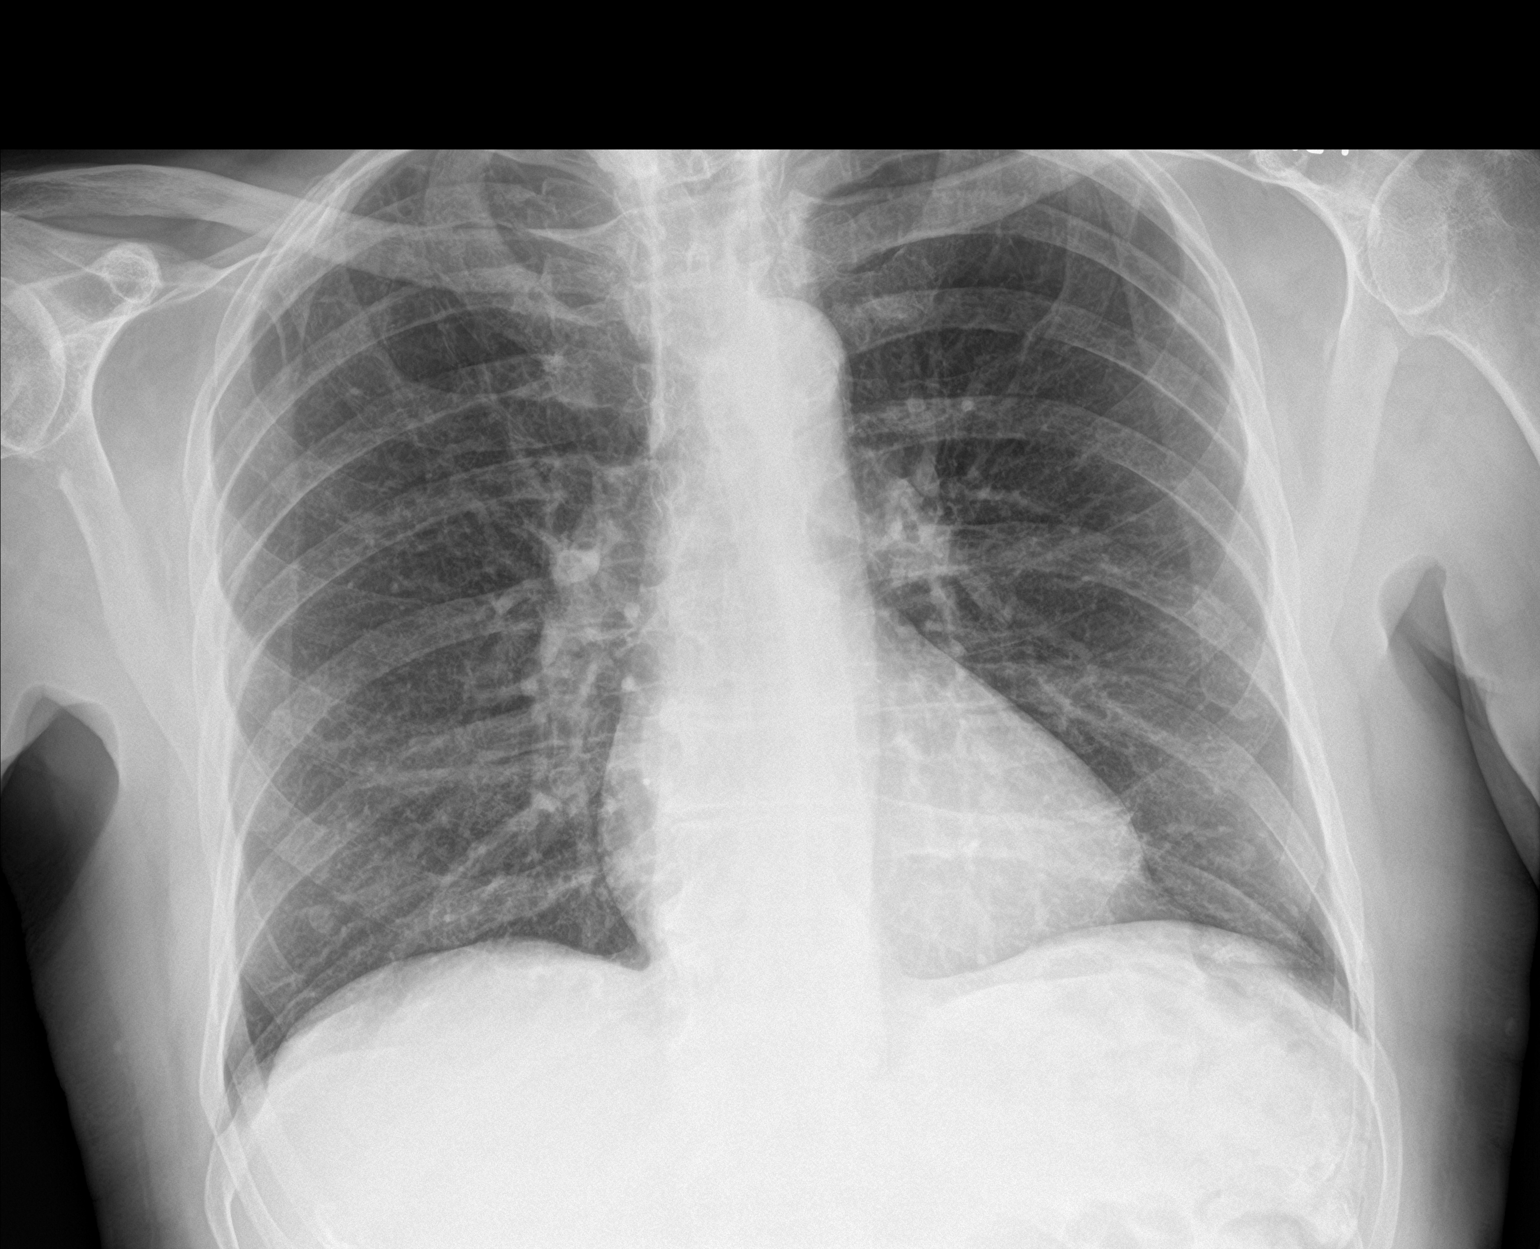

[chest lat]
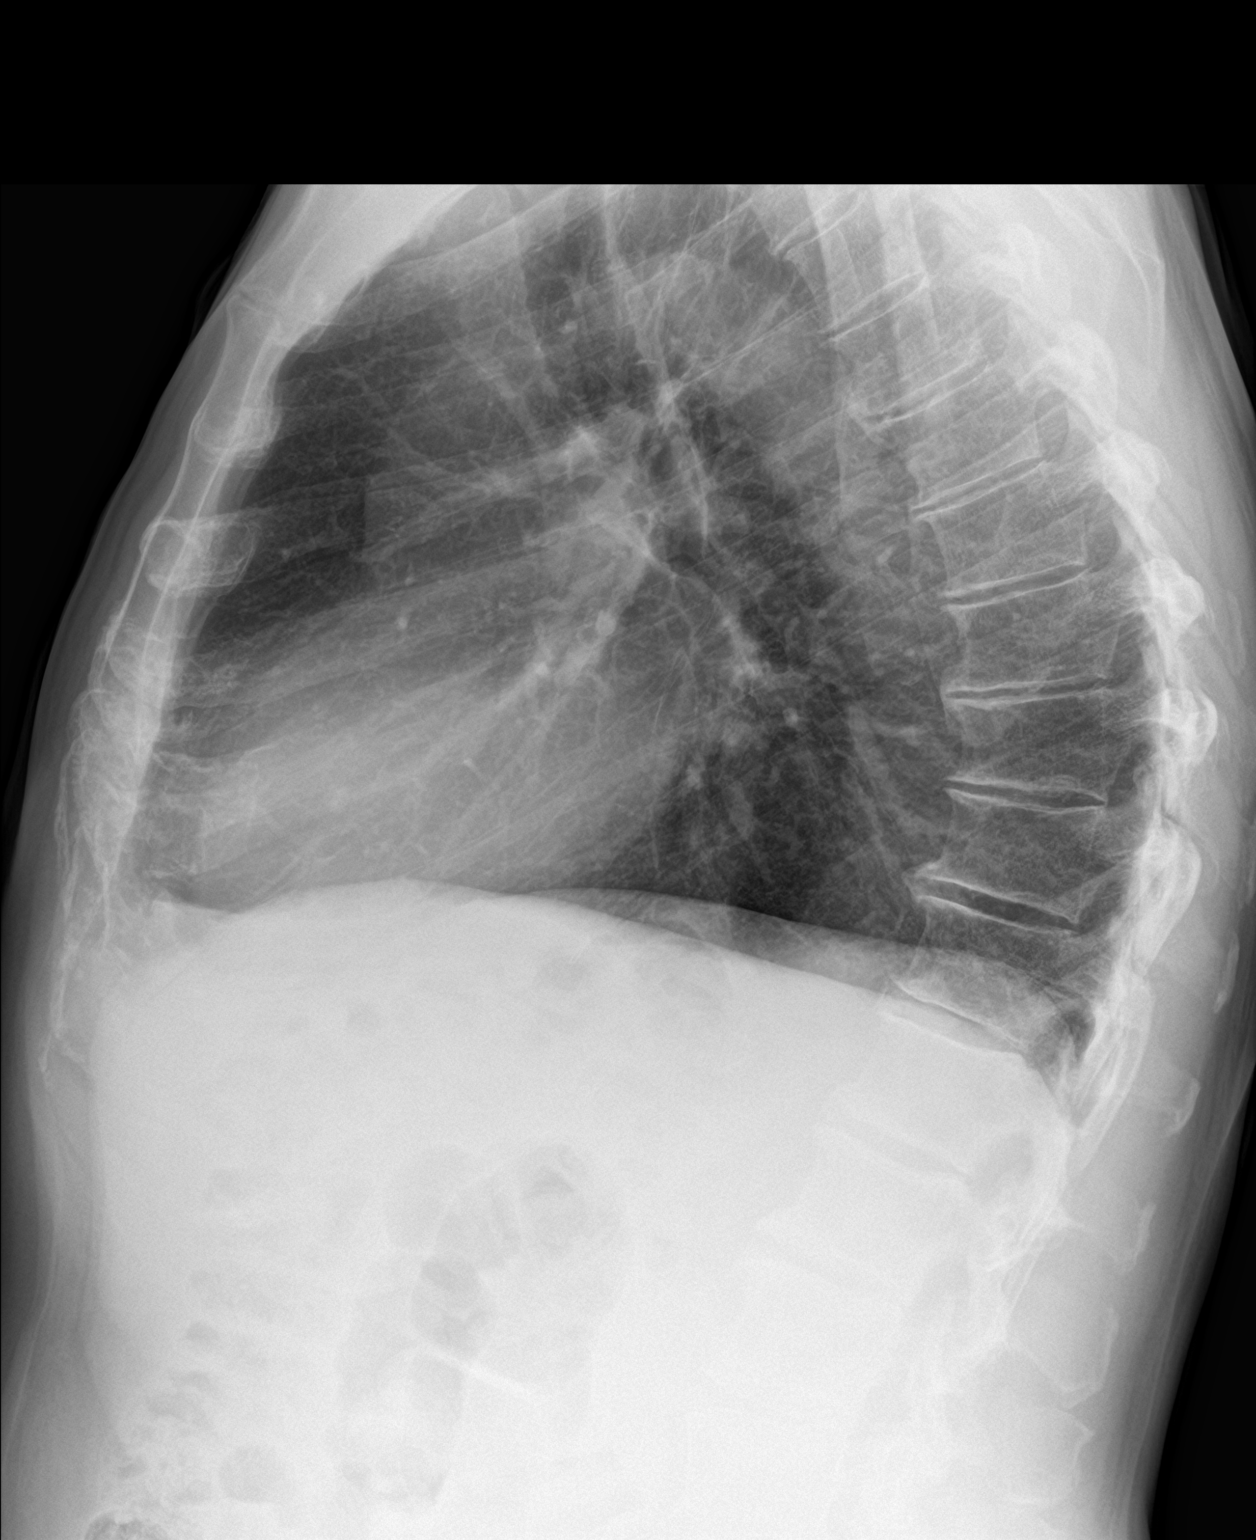

[2 of 2 positions shown; findings below may reference images not displayed]

FINDINGS: Lungs are adequately inflated without focal airspace consolidation
or effusion. Two small fairly symmetric rounded nodular densities
over the lung bases unchanged from 7010 likely the nipples.
Cardiomediastinal silhouette is within normal. Remainder the exam is
unchanged.
IMPRESSION: No acute findings.

## 2020-08-02 DIAGNOSIS — M4723 Other spondylosis with radiculopathy, cervicothoracic region: Secondary | ICD-10-CM | POA: Diagnosis not present

## 2020-08-02 DIAGNOSIS — M9901 Segmental and somatic dysfunction of cervical region: Secondary | ICD-10-CM | POA: Diagnosis not present

## 2020-08-02 DIAGNOSIS — M4726 Other spondylosis with radiculopathy, lumbar region: Secondary | ICD-10-CM | POA: Diagnosis not present

## 2020-08-02 DIAGNOSIS — M9902 Segmental and somatic dysfunction of thoracic region: Secondary | ICD-10-CM | POA: Diagnosis not present

## 2020-08-02 DIAGNOSIS — M9903 Segmental and somatic dysfunction of lumbar region: Secondary | ICD-10-CM | POA: Diagnosis not present

## 2020-08-10 ENCOUNTER — Other Ambulatory Visit: Payer: Self-pay | Admitting: Internal Medicine

## 2020-08-11 NOTE — Telephone Encounter (Signed)
Please refill as per office routine med refill policy (all routine meds refilled for 3 mo or monthly per pt preference up to one year from last visit, then month to month grace period for 3 mo, then further med refills will have to be denied)  

## 2020-08-16 DIAGNOSIS — M9902 Segmental and somatic dysfunction of thoracic region: Secondary | ICD-10-CM | POA: Diagnosis not present

## 2020-08-16 DIAGNOSIS — M4723 Other spondylosis with radiculopathy, cervicothoracic region: Secondary | ICD-10-CM | POA: Diagnosis not present

## 2020-08-16 DIAGNOSIS — M9903 Segmental and somatic dysfunction of lumbar region: Secondary | ICD-10-CM | POA: Diagnosis not present

## 2020-08-16 DIAGNOSIS — M9901 Segmental and somatic dysfunction of cervical region: Secondary | ICD-10-CM | POA: Diagnosis not present

## 2020-08-16 DIAGNOSIS — M4726 Other spondylosis with radiculopathy, lumbar region: Secondary | ICD-10-CM | POA: Diagnosis not present

## 2020-08-30 ENCOUNTER — Ambulatory Visit: Payer: Medicare Other | Admitting: Internal Medicine

## 2020-08-30 DIAGNOSIS — M4723 Other spondylosis with radiculopathy, cervicothoracic region: Secondary | ICD-10-CM | POA: Diagnosis not present

## 2020-08-30 DIAGNOSIS — M9901 Segmental and somatic dysfunction of cervical region: Secondary | ICD-10-CM | POA: Diagnosis not present

## 2020-08-30 DIAGNOSIS — M9903 Segmental and somatic dysfunction of lumbar region: Secondary | ICD-10-CM | POA: Diagnosis not present

## 2020-08-30 DIAGNOSIS — M4726 Other spondylosis with radiculopathy, lumbar region: Secondary | ICD-10-CM | POA: Diagnosis not present

## 2020-08-30 DIAGNOSIS — M9902 Segmental and somatic dysfunction of thoracic region: Secondary | ICD-10-CM | POA: Diagnosis not present

## 2020-09-06 ENCOUNTER — Other Ambulatory Visit: Payer: Self-pay | Admitting: Internal Medicine

## 2020-09-13 DIAGNOSIS — M9902 Segmental and somatic dysfunction of thoracic region: Secondary | ICD-10-CM | POA: Diagnosis not present

## 2020-09-13 DIAGNOSIS — M4726 Other spondylosis with radiculopathy, lumbar region: Secondary | ICD-10-CM | POA: Diagnosis not present

## 2020-09-13 DIAGNOSIS — M4723 Other spondylosis with radiculopathy, cervicothoracic region: Secondary | ICD-10-CM | POA: Diagnosis not present

## 2020-09-13 DIAGNOSIS — M9901 Segmental and somatic dysfunction of cervical region: Secondary | ICD-10-CM | POA: Diagnosis not present

## 2020-09-13 DIAGNOSIS — M9903 Segmental and somatic dysfunction of lumbar region: Secondary | ICD-10-CM | POA: Diagnosis not present

## 2020-09-22 ENCOUNTER — Other Ambulatory Visit: Payer: Self-pay | Admitting: Internal Medicine

## 2020-09-29 ENCOUNTER — Other Ambulatory Visit: Payer: Self-pay | Admitting: Internal Medicine

## 2020-12-22 ENCOUNTER — Inpatient Hospital Stay (HOSPITAL_COMMUNITY): Payer: Medicare Other

## 2020-12-22 ENCOUNTER — Emergency Department (HOSPITAL_COMMUNITY): Payer: Medicare Other

## 2020-12-22 ENCOUNTER — Encounter (HOSPITAL_COMMUNITY): Payer: Self-pay | Admitting: Emergency Medicine

## 2020-12-22 ENCOUNTER — Other Ambulatory Visit: Payer: Self-pay

## 2020-12-22 ENCOUNTER — Inpatient Hospital Stay (HOSPITAL_COMMUNITY)
Admission: EM | Admit: 2020-12-22 | Discharge: 2020-12-24 | DRG: 177 | Disposition: A | Discharge status: Home Health | Payer: Medicare Other | Attending: Internal Medicine | Admitting: Internal Medicine

## 2020-12-22 DIAGNOSIS — M25511 Pain in right shoulder: Secondary | ICD-10-CM | POA: Diagnosis present

## 2020-12-22 DIAGNOSIS — Z96651 Presence of right artificial knee joint: Secondary | ICD-10-CM | POA: Diagnosis present

## 2020-12-22 DIAGNOSIS — Z87891 Personal history of nicotine dependence: Secondary | ICD-10-CM | POA: Diagnosis not present

## 2020-12-22 DIAGNOSIS — N400 Enlarged prostate without lower urinary tract symptoms: Secondary | ICD-10-CM | POA: Diagnosis present

## 2020-12-22 DIAGNOSIS — Z7982 Long term (current) use of aspirin: Secondary | ICD-10-CM | POA: Diagnosis not present

## 2020-12-22 DIAGNOSIS — Z7902 Long term (current) use of antithrombotics/antiplatelets: Secondary | ICD-10-CM | POA: Diagnosis not present

## 2020-12-22 DIAGNOSIS — E039 Hypothyroidism, unspecified: Secondary | ICD-10-CM | POA: Diagnosis present

## 2020-12-22 DIAGNOSIS — E785 Hyperlipidemia, unspecified: Secondary | ICD-10-CM | POA: Diagnosis present

## 2020-12-22 DIAGNOSIS — R0902 Hypoxemia: Secondary | ICD-10-CM | POA: Insufficient documentation

## 2020-12-22 DIAGNOSIS — J1282 Pneumonia due to coronavirus disease 2019: Secondary | ICD-10-CM | POA: Diagnosis present

## 2020-12-22 DIAGNOSIS — U071 COVID-19: Secondary | ICD-10-CM | POA: Diagnosis present

## 2020-12-22 DIAGNOSIS — Z7984 Long term (current) use of oral hypoglycemic drugs: Secondary | ICD-10-CM | POA: Diagnosis not present

## 2020-12-22 DIAGNOSIS — R0602 Shortness of breath: Secondary | ICD-10-CM | POA: Diagnosis present

## 2020-12-22 DIAGNOSIS — R52 Pain, unspecified: Secondary | ICD-10-CM

## 2020-12-22 DIAGNOSIS — I69398 Other sequelae of cerebral infarction: Secondary | ICD-10-CM

## 2020-12-22 DIAGNOSIS — E119 Type 2 diabetes mellitus without complications: Secondary | ICD-10-CM | POA: Diagnosis present

## 2020-12-22 DIAGNOSIS — I10 Essential (primary) hypertension: Secondary | ICD-10-CM | POA: Diagnosis present

## 2020-12-22 DIAGNOSIS — K219 Gastro-esophageal reflux disease without esophagitis: Secondary | ICD-10-CM | POA: Diagnosis present

## 2020-12-22 DIAGNOSIS — Z79899 Other long term (current) drug therapy: Secondary | ICD-10-CM | POA: Diagnosis not present

## 2020-12-22 DIAGNOSIS — J9601 Acute respiratory failure with hypoxia: Secondary | ICD-10-CM | POA: Diagnosis present

## 2020-12-22 DIAGNOSIS — Z79890 Hormone replacement therapy (postmenopausal): Secondary | ICD-10-CM | POA: Diagnosis not present

## 2020-12-22 LAB — CBC WITH DIFFERENTIAL/PLATELET
Abs Immature Granulocytes: 0.12 10*3/uL — ABNORMAL HIGH (ref 0.00–0.07)
Basophils Absolute: 0 10*3/uL (ref 0.0–0.1)
Basophils Relative: 0 %
Eosinophils Absolute: 0 10*3/uL (ref 0.0–0.5)
Eosinophils Relative: 0 %
HCT: 35.8 % — ABNORMAL LOW (ref 39.0–52.0)
Hemoglobin: 11.9 g/dL — ABNORMAL LOW (ref 13.0–17.0)
Immature Granulocytes: 1 %
Lymphocytes Relative: 6 %
Lymphs Abs: 0.7 10*3/uL (ref 0.7–4.0)
MCH: 29.5 pg (ref 26.0–34.0)
MCHC: 33.2 g/dL (ref 30.0–36.0)
MCV: 88.8 fL (ref 80.0–100.0)
Monocytes Absolute: 0.7 10*3/uL (ref 0.1–1.0)
Monocytes Relative: 6 %
Neutro Abs: 10.3 10*3/uL — ABNORMAL HIGH (ref 1.7–7.7)
Neutrophils Relative %: 87 %
Platelets: 431 10*3/uL — ABNORMAL HIGH (ref 150–400)
RBC: 4.03 MIL/uL — ABNORMAL LOW (ref 4.22–5.81)
RDW: 14.1 % (ref 11.5–15.5)
WBC: 11.8 10*3/uL — ABNORMAL HIGH (ref 4.0–10.5)
nRBC: 0 % (ref 0.0–0.2)

## 2020-12-22 LAB — URINALYSIS, ROUTINE W REFLEX MICROSCOPIC
Bilirubin Urine: NEGATIVE
Glucose, UA: NEGATIVE mg/dL
Hgb urine dipstick: NEGATIVE
Ketones, ur: NEGATIVE mg/dL
Leukocytes,Ua: NEGATIVE
Nitrite: NEGATIVE
Protein, ur: 100 mg/dL — AB
Specific Gravity, Urine: 1.019 (ref 1.005–1.030)
pH: 8 (ref 5.0–8.0)

## 2020-12-22 LAB — COMPREHENSIVE METABOLIC PANEL
ALT: 33 U/L (ref 0–44)
AST: 38 U/L (ref 15–41)
Albumin: 3 g/dL — ABNORMAL LOW (ref 3.5–5.0)
Alkaline Phosphatase: 56 U/L (ref 38–126)
Anion gap: 13 (ref 5–15)
BUN: 16 mg/dL (ref 8–23)
CO2: 21 mmol/L — ABNORMAL LOW (ref 22–32)
Calcium: 8.8 mg/dL — ABNORMAL LOW (ref 8.9–10.3)
Chloride: 105 mmol/L (ref 98–111)
Creatinine, Ser: 0.78 mg/dL (ref 0.61–1.24)
GFR, Estimated: >60 mL/min (ref >60)
Glucose, Bld: 184 mg/dL — ABNORMAL HIGH (ref 70–99)
Potassium: 3.7 mmol/L (ref 3.5–5.1)
Sodium: 139 mmol/L (ref 135–145)
Total Bilirubin: 0.7 mg/dL (ref 0.3–1.2)
Total Protein: 6.9 g/dL (ref 6.5–8.1)

## 2020-12-22 LAB — CBG MONITORING, ED: Glucose-Capillary: 225 mg/dL — ABNORMAL HIGH (ref 70–99)

## 2020-12-22 LAB — TROPONIN I (HIGH SENSITIVITY)
Troponin I (High Sensitivity): 14 ng/L (ref <18)
Troponin I (High Sensitivity): 14 ng/L (ref <18)

## 2020-12-22 LAB — LACTATE DEHYDROGENASE: LDH: 392 U/L — ABNORMAL HIGH (ref 98–192)

## 2020-12-22 LAB — BLOOD GAS, VENOUS
Acid-base deficit: 2.5 mmol/L — ABNORMAL HIGH (ref 0.0–2.0)
Bicarbonate: 22.3 mmol/L (ref 20.0–28.0)
Drawn by: 1447
FIO2: 36
O2 Saturation: 37.7 %
Patient temperature: 37
pCO2, Ven: 41.9 mmHg — ABNORMAL LOW (ref 44.0–60.0)
pH, Ven: 7.345 (ref 7.250–7.430)
pO2, Ven: <31 mmHg — CL (ref 32.0–45.0)

## 2020-12-22 LAB — TRIGLYCERIDES: Triglycerides: 257 mg/dL — ABNORMAL HIGH (ref <150)

## 2020-12-22 LAB — PROCALCITONIN: Procalcitonin: <0.1 ng/mL

## 2020-12-22 LAB — FERRITIN: Ferritin: 212 ng/mL (ref 24–336)

## 2020-12-22 LAB — LACTIC ACID, PLASMA
Lactic Acid, Venous: 2 mmol/L (ref 0.5–1.9)
Lactic Acid, Venous: 1.8 mmol/L (ref 0.5–1.9)

## 2020-12-22 LAB — C-REACTIVE PROTEIN: CRP: 6.6 mg/dL — ABNORMAL HIGH (ref <1.0)

## 2020-12-22 LAB — BRAIN NATRIURETIC PEPTIDE: B Natriuretic Peptide: 202.8 pg/mL — ABNORMAL HIGH (ref 0.0–100.0)

## 2020-12-22 LAB — D-DIMER, QUANTITATIVE: D-Dimer, Quant: 1.73 ug/mL-FEU — ABNORMAL HIGH (ref 0.00–0.50)

## 2020-12-22 LAB — GLUCOSE, CAPILLARY: Glucose-Capillary: 184 mg/dL — ABNORMAL HIGH (ref 70–99)

## 2020-12-22 LAB — SARS CORONAVIRUS 2 BY RT PCR (HOSPITAL ORDER, PERFORMED IN ~~LOC~~ HOSPITAL LAB): SARS Coronavirus 2: POSITIVE — AB

## 2020-12-22 LAB — FIBRINOGEN: Fibrinogen: 653 mg/dL — ABNORMAL HIGH (ref 210–475)

## 2020-12-22 MED ORDER — ONDANSETRON HCL 4 MG PO TABS: 4.0000 mg | ORAL_TABLET | Freq: Four times a day (QID) | ORAL | Status: DC | PRN

## 2020-12-22 MED ORDER — GABAPENTIN 100 MG PO CAPS
400.0000 mg | ORAL_CAPSULE | Freq: Three times a day (TID) | ORAL | Status: DC
Start: 2020-12-22 — End: 2020-12-22

## 2020-12-22 MED ORDER — MOMETASONE FURO-FORMOTEROL FUM 200-5 MCG/ACT IN AERO
2.0000 | INHALATION_SPRAY | Freq: Two times a day (BID) | RESPIRATORY_TRACT | Status: DC
  Administered 2020-12-22 – 2020-12-24 (×3): 2 via RESPIRATORY_TRACT
  Filled 2020-12-22: qty 8.8

## 2020-12-22 MED ORDER — LORATADINE 10 MG PO TABS: 10.0000 mg | ORAL_TABLET | Freq: Every day | ORAL | Status: DC | PRN

## 2020-12-22 MED ORDER — INSULIN ASPART 100 UNIT/ML ~~LOC~~ SOLN
3.0000 [IU] | Freq: Three times a day (TID) | SUBCUTANEOUS | Status: DC
  Administered 2020-12-23 – 2020-12-24 (×5): 3 [IU] via SUBCUTANEOUS

## 2020-12-22 MED ORDER — BARICITINIB 2 MG PO TABS
4.0000 mg | ORAL_TABLET | Freq: Every day | ORAL | Status: DC
  Administered 2020-12-22 – 2020-12-24 (×3): 4 mg via ORAL
  Filled 2020-12-22 (×3): qty 2

## 2020-12-22 MED ORDER — GUAIFENESIN-DM 100-10 MG/5ML PO SYRP
10.0000 mL | ORAL_SOLUTION | ORAL | Status: DC | PRN
  Administered 2020-12-23: 10 mL via ORAL
  Filled 2020-12-22: qty 10

## 2020-12-22 MED ORDER — LABETALOL HCL 200 MG PO TABS
300.0000 mg | ORAL_TABLET | Freq: Two times a day (BID) | ORAL | Status: DC
  Administered 2020-12-22 – 2020-12-24 (×5): 300 mg via ORAL
  Filled 2020-12-22 (×4): qty 1
  Filled 2020-12-22: qty 2

## 2020-12-22 MED ORDER — ACETAMINOPHEN 325 MG PO TABS: 650.0000 mg | ORAL_TABLET | Freq: Four times a day (QID) | ORAL | Status: DC | PRN

## 2020-12-22 MED ORDER — LEVOTHYROXINE SODIUM 75 MCG PO TABS
150.0000 ug | ORAL_TABLET | Freq: Every day | ORAL | Status: DC
  Administered 2020-12-22 – 2020-12-24 (×3): 150 ug via ORAL
  Filled 2020-12-22 (×3): qty 2

## 2020-12-22 MED ORDER — DEXAMETHASONE SODIUM PHOSPHATE 10 MG/ML IJ SOLN
6.0000 mg | INTRAMUSCULAR | Status: DC
  Administered 2020-12-22 – 2020-12-23 (×2): 6 mg via INTRAVENOUS
  Filled 2020-12-22 (×2): qty 1

## 2020-12-22 MED ORDER — DEXAMETHASONE SODIUM PHOSPHATE 10 MG/ML IJ SOLN
8.0000 mg | Freq: Once | INTRAMUSCULAR | Status: AC
  Administered 2020-12-22: 8 mg via INTRAVENOUS
  Filled 2020-12-22: qty 1

## 2020-12-22 MED ORDER — FENOFIBRATE 160 MG PO TABS
160.0000 mg | ORAL_TABLET | Freq: Every day | ORAL | Status: DC
  Administered 2020-12-22 – 2020-12-24 (×3): 160 mg via ORAL
  Filled 2020-12-22 (×3): qty 1

## 2020-12-22 MED ORDER — INSULIN ASPART 100 UNIT/ML ~~LOC~~ SOLN
0.0000 [IU] | Freq: Three times a day (TID) | SUBCUTANEOUS | Status: DC
  Administered 2020-12-22: 5 [IU] via SUBCUTANEOUS
  Administered 2020-12-23 – 2020-12-24 (×4): 3 [IU] via SUBCUTANEOUS
  Administered 2020-12-24: 2 [IU] via SUBCUTANEOUS

## 2020-12-22 MED ORDER — ATORVASTATIN CALCIUM 80 MG PO TABS
80.0000 mg | ORAL_TABLET | Freq: Every day | ORAL | Status: DC
  Administered 2020-12-22 – 2020-12-24 (×3): 80 mg via ORAL
  Filled 2020-12-22: qty 1
  Filled 2020-12-22: qty 8
  Filled 2020-12-22: qty 1

## 2020-12-22 MED ORDER — ASPIRIN EC 325 MG PO TBEC
325.0000 mg | DELAYED_RELEASE_TABLET | Freq: Every day | ORAL | Status: DC
  Administered 2020-12-22 – 2020-12-24 (×3): 325 mg via ORAL
  Filled 2020-12-22 (×3): qty 1

## 2020-12-22 MED ORDER — ONDANSETRON HCL 4 MG/2ML IJ SOLN: 4.0000 mg | Freq: Four times a day (QID) | INTRAMUSCULAR | Status: DC | PRN

## 2020-12-22 MED ORDER — AMLODIPINE BESYLATE 5 MG PO TABS
5.0000 mg | ORAL_TABLET | Freq: Every day | ORAL | Status: DC
  Administered 2020-12-22 – 2020-12-24 (×3): 5 mg via ORAL
  Filled 2020-12-22 (×3): qty 1

## 2020-12-22 MED ORDER — CLOPIDOGREL BISULFATE 75 MG PO TABS
75.0000 mg | ORAL_TABLET | Freq: Every day | ORAL | Status: DC
  Administered 2020-12-22 – 2020-12-24 (×3): 75 mg via ORAL
  Filled 2020-12-22 (×3): qty 1

## 2020-12-22 MED ORDER — SODIUM CHLORIDE 0.9 % IV SOLN
100.0000 mg | Freq: Every day | INTRAVENOUS | Status: DC
  Administered 2020-12-23 – 2020-12-24 (×2): 100 mg via INTRAVENOUS
  Filled 2020-12-22 (×3): qty 20

## 2020-12-22 MED ORDER — BISACODYL 5 MG PO TBEC: 5.0000 mg | DELAYED_RELEASE_TABLET | Freq: Every day | ORAL | Status: DC | PRN

## 2020-12-22 MED ORDER — ENOXAPARIN SODIUM 40 MG/0.4ML ~~LOC~~ SOLN
40.0000 mg | SUBCUTANEOUS | Status: DC
  Administered 2020-12-22 – 2020-12-23 (×2): 40 mg via SUBCUTANEOUS
  Filled 2020-12-22 (×2): qty 0.4

## 2020-12-22 MED ORDER — IPRATROPIUM-ALBUTEROL 20-100 MCG/ACT IN AERS
1.0000 | INHALATION_SPRAY | Freq: Four times a day (QID) | RESPIRATORY_TRACT | Status: DC
  Administered 2020-12-22 – 2020-12-23 (×4): 1 via RESPIRATORY_TRACT
  Filled 2020-12-22: qty 4

## 2020-12-22 MED ORDER — SODIUM CHLORIDE 0.9 % IV SOLN
200.0000 mg | Freq: Once | INTRAVENOUS | Status: AC
  Administered 2020-12-22: 200 mg via INTRAVENOUS
  Filled 2020-12-22: qty 200

## 2020-12-22 NOTE — ED Notes (Signed)
Attempted report x1. 

## 2020-12-22 NOTE — H&P (Addendum)
History and Physical    [name] DDU:[id number] DOB: [birth year] DOA: 12/22/2020  PCP: Biagio Borg, MD (Confirm with patient/family/NH records and if not entered, this has to be entered at [facility] point of entry) Patient coming from: Home  I have personally briefly reviewed patient's old medical records in [place]  Chief Complaint: SOB  HPI: [name] is a 75 y.o. male with medical history significant of stroke with residual unbalanced gait, HTN, HLD, IIDM, hypothyroidism, presented with increasing shortness of breath and hypoxia.  Patient was not vaccinated for COVID-19.  He started to have symptoms and went to tested positive on 12/05/2020.  5-6 days ago, he started to experience increasing shortness of breath and cough and he went to see his PCP prescribed him with home oxygen 3 L, doxycycline and prednisolone (allergic to prednisone), patient has been taking for the last 3 days with modest improvement.  However last night, family noticed patient's O2 saturation dropped and frequently desat below 90 s with the 3 liters.  Patient also spiked fever with T-max 101 last 3 days.  He developed diarrhea last week which resolved 3 days ago.  Patient used to smoke heavily but quit 10 years ago after stroke.  He was never formally diagnosed with COPD however patient's family reported patient has developed frequent exertional dyspnea for last 6 months, with occasional wheezing.   ED Course: Stabilized on 94% on 4 L.  Chest x-ray showed multifocal infiltrates.  D-dimer 1.7  Review of Systems: As per HPI otherwise 14 point review of systems negative.    Past Medical History:  Diagnosis Date  . ALLERGIC RHINITIS 11/06/2007  . Anemia, unspecified 08/22/2012  . BENIGN PROSTATIC HYPERTROPHY 07/14/2007  . COLONIC POLYPS, HX OF 07/14/2007  . Complication of anesthesia    pt states he needs to be cathed after surgery  . Degenerative arthritis of right knee 12/12/2011  . GERD 07/11/2007  .  Headache(784.0) 07/11/2007  . HYPERLIPIDEMIA 07/11/2007  . HYPERTENSION 07/11/2007  . HYPERTHYROIDISM 07/14/2007  . HYPOTHYROIDISM 11/06/2007  . OSTEOARTHRITIS, KNEE, RIGHT 10/27/2008  . PERIPHERAL EDEMA 05/31/2009  . PULMONARY NODULE 06/24/2008   granuloma  . TIA (transient ischemic attack) 08/13/2015  . Type II or unspecified type diabetes mellitus without mention of complication, uncontrolled 08/22/2012   Patient states that he is not diabetic, not taking any medications  . Vitamin D deficiency 08/12/2015  . WOUND, LEG 05/31/2009    Past Surgical History:  Procedure Laterality Date  . ANTERIOR CERVICAL DECOMP/DISCECTOMY FUSION N/A 01/06/2013   Procedure: ANTERIOR CERVICAL DECOMPRESSION/DISCECTOMY FUSION 1 LEVEL;  Surgeon: Erline Levine, MD;  Location: [place] [facility];  Service: Neurosurgery;  Laterality: N/A;  Cervical three-four Anterior cervical decompression/diskectomy/fusion  . BACK SURGERY  1986   x 2  1986 C 3  . HERNIA REPAIR  sept 1986  . left knee replacement  Nov 21, 2007  . NECK SURGERY    . NISSEN FUNDOPLICATION  2376  . right heel bone spur  1987  . TOTAL KNEE ARTHROPLASTY  01/09/2012   Procedure: TOTAL KNEE ARTHROPLASTY;  Surgeon: Tobi Bastos, MD;  Location: [facility];  Service: Orthopedics;  Laterality: Right;     reports that he quit smoking about 38 years ago. His smoking use included cigarettes. He has a 90.00 pack-year smoking history. He has never used smokeless tobacco. He reports that he does not drink alcohol and does not use drugs.  Allergies  Allergen Reactions  . Codeine Other (See  Comments)    Headaches   . Hct [Hydrochlorothiazide] Other (See Comments)    hypercalcemia  . Metformin And Related Other (See Comments)    Cannot tolerate in higher doses- has to drop from 2,000 mg daily to 1,500 mg daily to STOP the diarrhea that was occurring  . Naproxen Diarrhea  . Prednisone Diarrhea  . Zolpidem Tartrate Other (See Comments)    Hallucinations   . Adhesive  [Tape] Rash and Other (See Comments)    Redness, also (thinks he can tolerate paper tape)  . Penicillins Rash and Other (See Comments)    Welts Has patient had a PCN reaction causing immediate rash, facial/tongue/throat swelling, SOB or lightheadedness with hypotension: Yes Has patient had a PCN reaction causing severe rash involving mucus membranes or skin necrosis: No Has patient had a PCN reaction that required hospitalization: No Has patient had a PCN reaction occurring within the last 10 years: No If all of the above answers are "NO", then may proceed with Cephalosporin use.     Family History  Problem Relation Age of Onset  . Heart attack Mother   . Heart attack Father   . Emphysema Father   . Heart disease Other   . Diabetes Neg Hx   . Thyroid disease Neg Hx   . Hypercalcemia Neg Hx      Prior to Admission medications   Medication Sig Start Date End Date Taking? Authorizing Provider  amLODipine (NORVASC) 5 MG tablet TAKE 1 TABLET BY MOUTH  DAILY 09/29/20   Biagio Borg, MD  aspirin EC 325 MG tablet Take 1 tablet (325 mg total) by mouth once as needed (when stroke-like symptoms present). 02/26/19   Biagio Borg, MD  atorvastatin (LIPITOR) 80 MG tablet TAKE 1 TABLET BY MOUTH  DAILY 07/26/20   Biagio Borg, MD  Blood Glucose Monitoring Suppl (ACCU-CHEK GUIDE) w/Device KIT 1 Device by Does not apply route daily. Once daily E11.9 02/29/20   Biagio Borg, MD  cetirizine (ZYRTEC) 10 MG tablet Take 1 tablet (10 mg total) by mouth daily. 08/31/19   Biagio Borg, MD  clopidogrel (PLAVIX) 75 MG tablet TAKE 1 TABLET BY MOUTH  DAILY 07/26/20   Biagio Borg, MD  fenofibrate 160 MG tablet TAKE 1 TABLET BY MOUTH  DAILY 02/11/20   Biagio Borg, MD  gabapentin (NEURONTIN) 400 MG capsule TAKE 1 CAPSULE BY MOUTH 3  TIMES DAILY 09/22/20   Biagio Borg, MD  glucose blood (ACCU-CHEK GUIDE) test strip Use as instructed once daily E11.9 02/29/20   Biagio Borg, MD  glucose blood test strip 1 each by  Other route 2 (two) times daily. Use to check blood sugars twice a day Dx E11.9 03/14/15   Biagio Borg, MD  labetalol (NORMODYNE) 300 MG tablet TAKE 1 TABLET BY MOUTH  TWICE DAILY 08/11/20   Biagio Borg, MD  Lancets MISC Use as directed once daily E11.9 02/29/20   Biagio Borg, MD  levothyroxine (SYNTHROID) 150 MCG tablet Take 1 tablet (150 mcg total) by mouth daily. 03/07/20   Biagio Borg, MD  metFORMIN (GLUCOPHAGE-XR) 500 MG 24 hr tablet TAKE 4 TABLETS BY MOUTH  DAILY WITH BREAKFAST 07/26/20   Biagio Borg, MD  Potassium 99 MG TABS Take 1 tablet by mouth daily.    [provider]    Physical Exam: Vitals:   12/22/20 1200 12/22/20 1215 12/22/20 1230 12/22/20 1245  BP: (!) 196/90 (!) 181/81 Marland Kitchen)  178/81 (!) 126/93  Pulse: 64 63 64 68  Resp: (!) 36 (!) 21 20 (!) 26  Temp:      TempSrc:      SpO2: 95% 95% 92% 99%    Constitutional: NAD, calm, comfortable Vitals:   12/22/20 1200 12/22/20 1215 12/22/20 1230 12/22/20 1245  BP: (!) 196/90 (!) 181/81 (!) 178/81 (!) 126/93  Pulse: 64 63 64 68  Resp: (!) 36 (!) 21 20 (!) 26  Temp:      TempSrc:      SpO2: 95% 95% 92% 99%   Eyes: PERRL, lids and conjunctivae normal ENMT: Mucous membranes are moist. Posterior pharynx clear of any exudate or lesions.Normal dentition.  Neck: normal, supple, no masses, no thyromegaly Respiratory: Diminished breathing sound bilaterally, scattered wheezing, increasing respiratory effort bilaterally,No accessory muscle use.  Cardiovascular: Regular rate and rhythm, no murmurs / rubs / gallops. No extremity edema. 2+ pedal pulses. No carotid bruits.  Abdomen: no tenderness, no masses palpated. No hepatosplenomegaly. Bowel sounds positive.  Musculoskeletal: no clubbing / cyanosis. No joint deformity upper and lower extremities. Good ROM, no contractures. Normal muscle tone.  Skin: no rashes, lesions, ulcers. No induration Neurologic: CN 2-12 grossly intact. Sensation intact, DTR normal. Strength 5/5 in  all 4.  Psychiatric: Normal judgment and insight. Alert and oriented x 3. Normal mood.     Labs on Admission: I have personally reviewed following labs and imaging studies  CBC: Recent Labs  Lab 12/22/20 1037  WBC 11.8*  NEUTROABS 10.3*  HGB 11.9*  HCT 35.8*  MCV 88.8  PLT 259*   Basic Metabolic Panel: Recent Labs  Lab 12/22/20 1037  NA 139  K 3.7  CL 105  CO2 21*  GLUCOSE 184*  BUN 16  CREATININE 0.78  CALCIUM 8.8*   GFR: CrCl cannot be calculated (Unknown ideal weight.). Liver Function Tests: Recent Labs  Lab 12/22/20 1037  AST 38  ALT 33  ALKPHOS 56  BILITOT 0.7  PROT 6.9  ALBUMIN 3.0*   No results for input(s): LIPASE, AMYLASE in the last 168 hours. No results for input(s): AMMONIA in the last 168 hours. Coagulation Profile: No results for input(s): INR, PROTIME in the last 168 hours. Cardiac Enzymes: No results for input(s): CKTOTAL, CKMB, CKMBINDEX, TROPONINI in the last 168 hours. BNP (last 3 results) No results for input(s): PROBNP in the last 8760 hours. HbA1C: No results for input(s): HGBA1C in the last 72 hours. CBG: No results for input(s): GLUCAP in the last 168 hours. Lipid Profile: Recent Labs    12/22/20 1039  TRIG 257*   Thyroid Function Tests: No results for input(s): TSH, T4TOTAL, FREET4, T3FREE, THYROIDAB in the last 72 hours. Anemia Panel: Recent Labs    12/22/20 1047  FERRITIN 212   Urine analysis:    Component Value Date/Time   COLORURINE YELLOW 08/31/2019 [place] 08/31/2019 1653   LABSPEC 1.010 08/31/2019 1653   PHURINE 7.0 08/31/2019 1653   GLUCOSEU NEGATIVE 08/31/2019 1653   HGBUR NEGATIVE 08/31/2019 [place] 08/31/2019 [place] 08/31/2019 [place] 09/18/2018 1625   UROBILINOGEN 0.2 08/31/2019 1653   NITRITE NEGATIVE 08/31/2019 [place] 08/31/2019 1653    Radiological Exams on Admission: DG Chest 2 View  Result  Date: 12/22/2020 CLINICAL DATA:  Shortness of breath and cough EXAM: CHEST - 2 VIEW COMPARISON:  September 03, 2019 FINDINGS: Ill-defined airspace opacity is noted in each mid and  lower lung region. There is interstitial thickening throughout the lungs as well. No consolidation. Heart size and pulmonary vascularity normal. No adenopathy. There is degenerative change in the thoracic spine. IMPRESSION: Multiple areas of ill-defined opacity bilaterally, likely atypical organism pneumonia. Check of COVID-19 status advised. Suspect a degree of underlying interstitial fibrotic type change. No consolidation. Heart size normal.  No adenopathy evident. Electronically Signed   By: Lowella Grip III M.D.   On: 12/22/2020 09:04    EKG: Independently reviewed.  ST-T depression V2 through V4.  Assessment/Plan Active Problems:   COVID-19  (please populate well all problems here in Problem List. (For example, if patient is on BP meds at home and you resume or decide to hold them, it is a problem that needs to be her. Same for CAD, COPD, HLD and so on)  COVID-19 pneumonia -With acute hypoxic respite failure -Remdesivir, Decadron, Olumiant  Acute hypoxic respite failure -Wean down oxygen -VBG to rule out CO2 retention  Acute bronchitis versus COPD exacerbation -patient was never formally diagnosed with COPD. -We will start Dulera, in addition to short-acting breathing treatment -recommend outpatient pulmonology follow-up for lung function test.  Elevated D-dimers -Secondary to COVID-19 infection, trend and decide further image study.  Remote history of smoking -Outpatient pulmonology follow-up for lung function test to rule out COPD  Abnormal EKG -No chest pain, send 2 sets of troponins -BNP -Consider echo  Chronic ambulation dysfunction -Residual symptom from last stroke -Fall precaution, PT evaluation  Right shoulder pain -As reported by patient's family, will order right shoulder chest x-ray  chronic pain fracture or dislocation.  IIDM -Sliding scale for now with 3 unit short-acting for meal coverage    DVT prophylaxis: Lovenox  code Status: FullCode Family Communication: Daughter over phone Disposition Plan: Expect 3 to 4 days hospital stay to wean down oxygen  Consults called: None Admission status: Telemetry admission   Lequita Halt MD [facility] Pager [phone]  12/22/2020, 1:08 PM

## 2020-12-22 NOTE — ED Triage Notes (Signed)
Patient BIB daughter from home. Patient complaint of shortness of breath and cough for about 4 days. Patient had positive covid test on 10th of this month per daughter. VSS. NAD.

## 2020-12-22 NOTE — ED Provider Notes (Signed)
[name] EMERGENCY DEPARTMENT Provider Note   CSN: [id number] Arrival date & time: 12/22/20  1751     History Chief Complaint  Patient presents with  . Cough  . Shortness of Breath    [name] is a 75 y.o. male with PMH of HTN, HLD, type II DM, and history of tobacco use who presents the ED with a 4-day history of shortness of breath, fevers, and cough.  Patient endorses a 90-pack-year smoking history and is chronically on 3 L supplemental oxygen via [place] at home.  He states that he wears his oxygen at all times.  However, over the course the past few days he has been increasingly short of breath, particularly with any form of exertion.  He states he has been a cough productive of a clearish white phlegm.  He states that his wife with whom he lives is also sick.  He diminished appetite, but denies any nausea or emesis.  He states that his T-max has been 66 F.  He has been taking Tylenol and also states that he recently had been prescribed steroids which she has been taking without effect.  He cannot tell me who prescribed steroids and states that his daughter would have to provide him with that information.  He states that he is followed by [facility].  Patient is unvaccinated for COVID-19.  He denies any chest pain, pleuritic symptoms, hemoptysis, inability to eat or drink, extremity swelling or edema, history of clots or clotting disorder, or other symptoms.  He is taking a blood thinner given his history of TIA x3.  Denies any new focal deficits.  I then spoke with patient's daughter, [name], who states that he is actually been sick for 15 days. She states that he first became symptomatic with fever and chills on 12/07/2020. She states that their NP just recently started him on oxygen and he did not actually have any prior oxygen requirements. She states that he had also been prescribed doxycycline and a Solu-Medrol Dosepak which she has been taking,  as directed. He is on day three of his Solu-Medrol Dosepak. She states that he has an allergy to prednisone, but can take Solu-Medrol. She also reports that he has been particularly weak and fatigued these past couple of weeks.    HPI     Past Medical History:  Diagnosis Date  . ALLERGIC RHINITIS 11/06/2007  . Anemia, unspecified 08/22/2012  . BENIGN PROSTATIC HYPERTROPHY 07/14/2007  . COLONIC POLYPS, HX OF 07/14/2007  . Complication of anesthesia    pt states he needs to be cathed after surgery  . Degenerative arthritis of right knee 12/12/2011  . GERD 07/11/2007  . Headache(784.0) 07/11/2007  . HYPERLIPIDEMIA 07/11/2007  . HYPERTENSION 07/11/2007  . HYPERTHYROIDISM 07/14/2007  . HYPOTHYROIDISM 11/06/2007  . OSTEOARTHRITIS, KNEE, RIGHT 10/27/2008  . PERIPHERAL EDEMA 05/31/2009  . PULMONARY NODULE 06/24/2008   granuloma  . TIA (transient ischemic attack) 08/13/2015  . Type II or unspecified type diabetes mellitus without mention of complication, uncontrolled 08/22/2012   Patient states that he is not diabetic, not taking any medications  . Vitamin D deficiency 08/12/2015  . WOUND, LEG 05/31/2009    Patient Active Problem List   Diagnosis Date Noted  . Microalbuminuria 08/31/2019  . Dyspnea 08/31/2019  . Upper back pain on right side 08/31/2019  . Weight loss 08/31/2019  . Hypervitaminosis D 01/14/2019  . Polyarthralgia 09/12/2017  . Chronic osteomyelitis of toe of left foot ([place]) 09/12/2017  .  Skin lesion 02/19/2017  . Rotator cuff arthropathy 09/07/2016  . Recurrent falls 08/17/2016  . Right shoulder pain 08/17/2016  . TIA (transient ischemic attack) 08/13/2015  . Hypercalcemia 08/12/2015  . Vitamin D deficiency 08/12/2015  . Former smoker 02/08/2015  . Spinal stenosis in cervical region 03/31/2013  . Disequilibrium 03/31/2013  . Lacunar infarction ([place]) 03/31/2013  . Abnormality of gait 03/31/2013  . Diabetes ([place]) 02/03/2013  . Spondylosis, cervical, with myelopathy 11/11/2012   . Anemia, unspecified 08/22/2012  . Degenerative arthritis of right knee 12/12/2011  . Encounter for well adult exam with abnormal findings 12/10/2011  . PERIPHERAL EDEMA 05/31/2009  . OSTEOARTHRITIS, KNEE, RIGHT 10/27/2008  . PULMONARY NODULE 06/24/2008  . Hypothyroidism 11/06/2007  . ALLERGIC RHINITIS 11/06/2007  . BENIGN PROSTATIC HYPERTROPHY 07/14/2007  . COLONIC POLYPS, HX OF 07/14/2007  . Hyperlipidemia 07/11/2007  . Essential hypertension 07/11/2007  . GERD 07/11/2007    Past Surgical History:  Procedure Laterality Date  . ANTERIOR CERVICAL DECOMP/DISCECTOMY FUSION N/A 01/06/2013   Procedure: ANTERIOR CERVICAL DECOMPRESSION/DISCECTOMY FUSION 1 LEVEL;  Surgeon: Erline Levine, MD;  Location: [place] [facility];  Service: Neurosurgery;  Laterality: N/A;  Cervical three-four Anterior cervical decompression/diskectomy/fusion  . BACK SURGERY  1986   x 2  1986 C 3  . HERNIA REPAIR  sept 1986  . left knee replacement  Nov 21, 2007  . NECK SURGERY    . NISSEN FUNDOPLICATION  0940  . right heel bone spur  1987  . TOTAL KNEE ARTHROPLASTY  01/09/2012   Procedure: TOTAL KNEE ARTHROPLASTY;  Surgeon: Tobi Bastos, MD;  Location: [facility];  Service: Orthopedics;  Laterality: Right;       Family History  Problem Relation Age of Onset  . Heart attack Mother   . Heart attack Father   . Emphysema Father   . Heart disease Other   . Diabetes Neg Hx   . Thyroid disease Neg Hx   . Hypercalcemia Neg Hx     Social History   Tobacco Use  . Smoking status: Former Smoker    Packs/day: 3.00    Years: 30.00    Pack years: 90.00    Types: Cigarettes    Quit date: 11/26/1982    Years since quitting: 38.0  . Smokeless tobacco: Never Used  Substance Use Topics  . Alcohol use: No  . Drug use: No    Home Medications Prior to Admission medications   Medication Sig Start Date End Date Taking? Authorizing Provider  amLODipine (NORVASC) 5 MG tablet TAKE 1 TABLET BY MOUTH  DAILY 09/29/20   Biagio Borg, MD  aspirin EC 325 MG tablet Take 1 tablet (325 mg total) by mouth once as needed (when stroke-like symptoms present). 02/26/19   Biagio Borg, MD  atorvastatin (LIPITOR) 80 MG tablet TAKE 1 TABLET BY MOUTH  DAILY 07/26/20   Biagio Borg, MD  Blood Glucose Monitoring Suppl (ACCU-CHEK GUIDE) w/Device KIT 1 Device by Does not apply route daily. Once daily E11.9 02/29/20   Biagio Borg, MD  cetirizine (ZYRTEC) 10 MG tablet Take 1 tablet (10 mg total) by mouth daily. 08/31/19   Biagio Borg, MD  clopidogrel (PLAVIX) 75 MG tablet TAKE 1 TABLET BY MOUTH  DAILY 07/26/20   Biagio Borg, MD  fenofibrate 160 MG tablet TAKE 1 TABLET BY MOUTH  DAILY 02/11/20   Biagio Borg, MD  gabapentin (NEURONTIN) 400 MG capsule TAKE 1 CAPSULE BY MOUTH 3  TIMES DAILY 09/22/20  Biagio Borg, MD  glucose blood (ACCU-CHEK GUIDE) test strip Use as instructed once daily E11.9 02/29/20   Biagio Borg, MD  glucose blood test strip 1 each by Other route 2 (two) times daily. Use to check blood sugars twice a day Dx E11.9 03/14/15   Biagio Borg, MD  labetalol (NORMODYNE) 300 MG tablet TAKE 1 TABLET BY MOUTH  TWICE DAILY 08/11/20   Biagio Borg, MD  Lancets MISC Use as directed once daily E11.9 02/29/20   Biagio Borg, MD  levothyroxine (SYNTHROID) 150 MCG tablet Take 1 tablet (150 mcg total) by mouth daily. 03/07/20   Biagio Borg, MD  metFORMIN (GLUCOPHAGE-XR) 500 MG 24 hr tablet TAKE 4 TABLETS BY MOUTH  DAILY WITH BREAKFAST 07/26/20   Biagio Borg, MD  Potassium 99 MG TABS Take 1 tablet by mouth daily.    [provider]    Allergies    Codeine, Hct [hydrochlorothiazide], Metformin and related, Naproxen, Prednisone, Zolpidem tartrate, Adhesive [tape], and Penicillins  Review of Systems   Review of Systems  All other systems reviewed and are negative.   Physical Exam Updated Vital Signs BP (!) 192/86   Pulse 61   Temp 99 F (37.2 C) (Oral)   Resp (!) 31   SpO2 95%   Physical Exam Vitals and nursing  note reviewed. Exam conducted with a chaperone present.  Constitutional:      Appearance: He is ill-appearing.  HENT:     Head: Normocephalic and atraumatic.  Eyes:     General: No scleral icterus.    Conjunctiva/sclera: Conjunctivae normal.  Cardiovascular:     Rate and Rhythm: Normal rate and regular rhythm.     Pulses: Normal pulses.  Pulmonary:     Breath sounds: Rales present.     Comments: Increased work of breathing, particularly when speaking or moving.  Rales noted bilaterally.  Maintaining oxygen saturation of 90 to 92% at rest on 4 L supplemental O2, but quickly dips to mid 80s with tachypnea and 30s when speaking.  No accessory muscle use or distress. Musculoskeletal:     Cervical back: Normal range of motion.  Skin:    General: Skin is dry.     Capillary Refill: Capillary refill takes less than 2 seconds.  Neurological:     Mental Status: He is alert and oriented to person, place, and time.     GCS: GCS eye subscore is 4. GCS verbal subscore is 5. GCS motor subscore is 6.  Psychiatric:        Mood and Affect: Mood normal.        Behavior: Behavior normal.        Thought Content: Thought content normal.     ED Results / Procedures / Treatments   Labs (all labs ordered are listed, but only abnormal results are displayed) Labs Reviewed  SARS CORONAVIRUS 2 BY RT PCR ([facility]) - Abnormal; Notable for the following components:      Result Value   SARS Coronavirus 2 POSITIVE (*)    All other components within normal limits  CBC WITH DIFFERENTIAL/PLATELET - Abnormal; Notable for the following components:   WBC 11.8 (*)    RBC 4.03 (*)    Hemoglobin 11.9 (*)    HCT 35.8 (*)    Platelets 431 (*)    Neutro Abs 10.3 (*)    Abs Immature Granulocytes 0.12 (*)    All other components within  normal limits  COMPREHENSIVE METABOLIC PANEL - Abnormal; Notable for the following components:   CO2 21 (*)    Glucose, Bld 184 (*)     Calcium 8.8 (*)    Albumin 3.0 (*)    All other components within normal limits  LACTIC ACID, PLASMA - Abnormal; Notable for the following components:   Lactic Acid, Venous 2.0 (*)    All other components within normal limits  D-DIMER, QUANTITATIVE (NOT AT [facility]) - Abnormal; Notable for the following components:   D-Dimer, Quant 1.73 (*)    All other components within normal limits  TRIGLYCERIDES - Abnormal; Notable for the following components:   Triglycerides 257 (*)    All other components within normal limits  FIBRINOGEN - Abnormal; Notable for the following components:   Fibrinogen 653 (*)    All other components within normal limits  C-REACTIVE PROTEIN - Abnormal; Notable for the following components:   CRP 6.6 (*)    All other components within normal limits  LACTATE DEHYDROGENASE - Abnormal; Notable for the following components:   LDH 392 (*)    All other components within normal limits  CULTURE, BLOOD (ROUTINE X 2)  CULTURE, BLOOD (ROUTINE X 2)  URINE CULTURE  FERRITIN  PROCALCITONIN  LACTIC ACID, PLASMA  URINALYSIS, ROUTINE W REFLEX MICROSCOPIC    EKG EKG Interpretation  Date/Time:  Thursday December 22 2020 08:27:09 EST Ventricular Rate:  72 PR Interval:    QRS Duration: 86 QT Interval:  438 QTC Calculation: 479 R Axis:   59 Text Interpretation: Atrial flutter with 3:1 A-V conduction ST & T wave abnormality, consider anterior ischemia Prolonged QT Abnormal ECG Appears NSR, T wave inversions v2-3 new Confirmed by Lavenia Atlas [phone]) on 12/22/2020 10:52:03 AM   Radiology DG Chest 2 View  Result Date: 12/22/2020 CLINICAL DATA:  Shortness of breath and cough EXAM: CHEST - 2 VIEW COMPARISON:  September 03, 2019 FINDINGS: Ill-defined airspace opacity is noted in each mid and lower lung region. There is interstitial thickening throughout the lungs as well. No consolidation. Heart size and pulmonary vascularity normal. No adenopathy. There is degenerative change in the  thoracic spine. IMPRESSION: Multiple areas of ill-defined opacity bilaterally, likely atypical organism pneumonia. Check of COVID-19 status advised. Suspect a degree of underlying interstitial fibrotic type change. No consolidation. Heart size normal.  No adenopathy evident. Electronically Signed   By: Lowella Grip III M.D.   On: 12/22/2020 09:04    Procedures .Critical Care Performed by: Corena Herter, PA-C Authorized by: Corena Herter, PA-C   Critical care provider statement:    Critical care time (minutes):  45   Critical care was necessary to treat or prevent imminent or life-threatening deterioration of the following conditions:  Respiratory failure   Critical care was time spent personally by me on the following activities:  Discussions with consultants, evaluation of patient's response to treatment, examination of patient, ordering and performing treatments and interventions, ordering and review of laboratory studies, ordering and review of radiographic studies, pulse oximetry, re-evaluation of patient's condition, obtaining history from patient or surrogate, review of old charts and blood draw for specimens Comments:     Acute hypoxic respiratory failure in setting of COVID-19      Medications Ordered in ED Medications  remdesivir 200 mg in sodium chloride 0.9% 250 mL IVPB (has no administration in time range)    Followed by  remdesivir 100 mg in sodium chloride 0.9 % 100 mL IVPB (has no administration in  time range)  dexamethasone (DECADRON) injection 8 mg (8 mg Intravenous Given 12/22/20 1146)    ED Course  I have reviewed the triage vital signs and the nursing notes.  Pertinent labs & imaging results that were available during my care of the patient were reviewed by me and considered in my medical decision making (see chart for details).  Clinical Course as of 12/22/20 1234  Thu Dec 22, 2020  1027 SARS Coronavirus 2(!): POSITIVE [GG]  1233 I spoke with Dr. Roosevelt Locks  who will see and admit patient for COVID-19 hypoxia. [GG]    Clinical Course User Index [GG] Corena Herter, PA-C   MDM Rules/Calculators/A&P                          JAQUARIUS SEDER was evaluated in Emergency Department on 12/22/2020 for the symptoms described in the history of present illness. He was evaluated in the context of the global COVID-19 pandemic, which necessitated consideration that the patient might be at risk for infection with the SARS-CoV-2 virus that causes COVID-19. Institutional protocols and algorithms that pertain to the evaluation of patients at risk for COVID-19 are in a state of rapid change based on information released by regulatory bodies including the CDC and federal and state organizations. These policies and algorithms were followed during the patient's care in the ED.  I personally reviewed patient's medical chart and all notes from triage and staff during today's encounter. I have also ordered and reviewed all labs and imaging that I felt to be medically necessary in the evaluation of this patient's complaints and with consideration of their with their physical exam. If needed, translation services were available and utilized.   Patient's history physical exam is concerning for COVID-19 pneumonia.  Patient with increased oxygen requirements here in the ED.  Will require admission for steroids and antiviral medications.  Chest x-ray is personally reviewed which demonstrates multifocal pneumonia concerning for atypical/viral infection.  Mild (11.8, likely reflective of Solu-Medrol Dosepak.  Lower suspicion for superimposed bacterial infection.  I spoke with Dr. Roosevelt Locks who will see and admit patient for COVID-19 hypoxia.   Final Clinical Impression(s) / ED Diagnoses Final diagnoses:  COVID-19  Hypoxia    Rx / DC Orders ED Discharge Orders    None       Corena Herter, PA-C 12/22/20 1240    Horton, Alvin Critchley, DO 12/22/20 1924

## 2020-12-22 NOTE — ED Notes (Signed)
Pt helped in Bed with [place]. While talking with Pt his O2 does drop to mid 80's. 86% on 3L nasal documented. Turned O2 to 4L Nasal. While PA was also talking with Pt his O2 did drop at times when he talks. 86%-92% while talking in bed. Pt kept on 4L Nasal.

## 2020-12-22 NOTE — ED Notes (Signed)
Date and time results received: 12/22/20    Test: Lactic Critical Value: 2.0  Name of Provider Notified: Krista Blue PA

## 2020-12-22 NOTE — Evaluation (Signed)
Physical Therapy Evaluation Patient Details Name: [name] MRN: [id number] DOB: 05-29-1946 Today's Date: 12/22/2020   History of Present Illness  Pt is a 75 y/o male admitted secondary to SOB and hypoxia from COVID. PMH includes CVA, HTN, TIA, DM, and R TKA.  Clinical Impression  Pt admitted secondary to problem above with deficits below. Pt requiring min A to stand and take side steps at EOB with HHA. Pt with increased unsteadiness without use of AD. SpO2 at 92-94% on 4L this session. Pt reports he has support from his wife and daughter at d/c. Will need to ensure they can provide appropriate assist at d/c. If they cannot provide assist, may need to consider ST SNF placement to increase independence and safety. Will continue to follow acutely.     Follow Up Recommendations Home health PT;Supervision/Assistance - 24 hour    Equipment Recommendations  None recommended by PT    Recommendations for Other Services       Precautions / Restrictions Precautions Precautions: Fall Restrictions Weight Bearing Restrictions: No      Mobility  Bed Mobility               General bed mobility comments: Sitting EOB upon entry    Transfers Overall transfer level: Needs assistance Equipment used: 1 person hand held assist Transfers: Sit to/from Stand Sit to Stand: Min assist         General transfer comment: Min A for steadying assist.  Ambulation/Gait Ambulation/Gait assistance: Min assist   Assistive device: 1 person hand held assist   Gait velocity: Decreased   General Gait Details: Took side steps at EOB and pt unsteady. Pt relying on bed on back of legs for support. Min A for steadying assist.  Stairs            Wheelchair Mobility    Modified Rankin (Stroke Patients Only)       Balance Overall balance assessment: Needs assistance Sitting-balance support: No upper extremity supported;Feet supported Sitting balance-Leahy Scale: Good     Standing  balance support: Single extremity supported;During functional activity Standing balance-Leahy Scale: Poor Standing balance comment: Reliant on at least 1 UE and external support                             Pertinent Vitals/Pain Pain Assessment: 0-10 Pain Score: 6  Pain Location: R shoulders and bilateral posterior knee Pain Descriptors / Indicators: Aching Pain Intervention(s): Limited activity within patient's tolerance;Monitored during session;Repositioned    Home Living Family/patient expects to be discharged to:: Private residence Living Arrangements: Spouse/significant other;Children Available Help at Discharge: Family;Available 24 hours/day Type of Home: House Home Access: Ramped entrance     Home Layout: One level Home Equipment: Walker - 2 wheels;Wheelchair - [employer];Shower seat      Prior Function Level of Independence: Independent with assistive device(s)         Comments: uses RW for ambulation     Hand Dominance   Dominant Hand: Left    Extremity/Trunk Assessment   Upper Extremity Assessment Upper Extremity Assessment: Defer to OT evaluation    Lower Extremity Assessment Lower Extremity Assessment: Generalized weakness    Cervical / Trunk Assessment Cervical / Trunk Assessment: Kyphotic  Communication      Cognition Arousal/Alertness: Awake/alert Behavior During Therapy: WFL for tasks assessed/performed Overall Cognitive Status: No family/caregiver present to determine baseline cognitive functioning  General Comments: Likely at baseline.      General Comments General comments (skin integrity, edema, etc.): SpO2 at 92-94% on 4L.    Exercises     Assessment/Plan    PT Assessment Patient needs continued PT services  PT Problem List Decreased strength;Decreased balance;Decreased mobility;Decreased activity tolerance;Decreased knowledge of use of DME;Decreased knowledge  of precautions       PT Treatment Interventions DME instruction;Gait training;Therapeutic activities;Functional mobility training;Therapeutic exercise;Balance training;Patient/family education;Cognitive remediation    PT Goals (Current goals can be found in the Care Plan section)  Acute Rehab PT Goals Patient Stated Goal: to go home PT Goal Formulation: With patient Time For Goal Achievement: 01/05/21 Potential to Achieve Goals: Good    Frequency Min 3X/week   Barriers to discharge        Co-evaluation               AM-PAC PT "6 Clicks" Mobility  Outcome Measure Help needed turning from your back to your side while in a flat bed without using bedrails?: A Little Help needed moving from lying on your back to sitting on the side of a flat bed without using bedrails?: A Little Help needed moving to and from a bed to a chair (including a wheelchair)?: A Little Help needed standing up from a chair using your arms (e.g., wheelchair or bedside chair)?: A Little Help needed to walk in hospital room?: A Lot Help needed climbing 3-5 steps with a railing? : A Lot 6 Click Score: 16    End of Session Equipment Utilized During Treatment: Gait belt Activity Tolerance: Patient tolerated treatment well Patient left: in bed;with call bell/phone within reach (on stretcher in ED) Nurse Communication: Mobility status PT Visit Diagnosis: Unsteadiness on feet (R26.81);Muscle weakness (generalized) (M62.81)    Time: 7482-7078 PT Time Calculation (min) (ACUTE ONLY): 18 min   Charges:   PT Evaluation $PT Eval Moderate Complexity: 1 Mod         Reuel Derby, PT, DPT  Acute Rehabilitation Services  Pager: [phone] Office: [phone]   Rudean Hitt 12/22/2020, 4:41 PM

## 2020-12-23 LAB — MAGNESIUM: Magnesium: 1.9 mg/dL (ref 1.7–2.4)

## 2020-12-23 LAB — URINE CULTURE: Culture: NO GROWTH

## 2020-12-23 LAB — CBC WITH DIFFERENTIAL/PLATELET
Abs Immature Granulocytes: 0 10*3/uL (ref 0.00–0.07)
Basophils Absolute: 0 10*3/uL (ref 0.0–0.1)
Basophils Relative: 0 %
Eosinophils Absolute: 0 10*3/uL (ref 0.0–0.5)
Eosinophils Relative: 0 %
HCT: 34.4 % — ABNORMAL LOW (ref 39.0–52.0)
Hemoglobin: 10.8 g/dL — ABNORMAL LOW (ref 13.0–17.0)
Lymphocytes Relative: 9 %
Lymphs Abs: 0.7 10*3/uL (ref 0.7–4.0)
MCH: 27.6 pg (ref 26.0–34.0)
MCHC: 31.4 g/dL (ref 30.0–36.0)
MCV: 88 fL (ref 80.0–100.0)
Monocytes Absolute: 0.4 10*3/uL (ref 0.1–1.0)
Monocytes Relative: 5 %
Neutro Abs: 7.1 10*3/uL (ref 1.7–7.7)
Neutrophils Relative %: 86 %
Platelets: 420 10*3/uL — ABNORMAL HIGH (ref 150–400)
RBC: 3.91 MIL/uL — ABNORMAL LOW (ref 4.22–5.81)
RDW: 13.7 % (ref 11.5–15.5)
WBC: 8.2 10*3/uL (ref 4.0–10.5)
nRBC: 0 % (ref 0.0–0.2)
nRBC: 0 /100 WBC

## 2020-12-23 LAB — COMPREHENSIVE METABOLIC PANEL
ALT: 32 U/L (ref 0–44)
AST: 32 U/L (ref 15–41)
Albumin: 2.6 g/dL — ABNORMAL LOW (ref 3.5–5.0)
Alkaline Phosphatase: 50 U/L (ref 38–126)
Anion gap: 13 (ref 5–15)
BUN: 23 mg/dL (ref 8–23)
CO2: 21 mmol/L — ABNORMAL LOW (ref 22–32)
Calcium: 8.5 mg/dL — ABNORMAL LOW (ref 8.9–10.3)
Chloride: 107 mmol/L (ref 98–111)
Creatinine, Ser: 0.78 mg/dL (ref 0.61–1.24)
GFR, Estimated: >60 mL/min (ref >60)
Glucose, Bld: 212 mg/dL — ABNORMAL HIGH (ref 70–99)
Potassium: 3.7 mmol/L (ref 3.5–5.1)
Sodium: 141 mmol/L (ref 135–145)
Total Bilirubin: 0.8 mg/dL (ref 0.3–1.2)
Total Protein: 6.3 g/dL — ABNORMAL LOW (ref 6.5–8.1)

## 2020-12-23 LAB — GLUCOSE, CAPILLARY
Glucose-Capillary: 187 mg/dL — ABNORMAL HIGH (ref 70–99)
Glucose-Capillary: 166 mg/dL — ABNORMAL HIGH (ref 70–99)
Glucose-Capillary: 170 mg/dL — ABNORMAL HIGH (ref 70–99)
Glucose-Capillary: 201 mg/dL — ABNORMAL HIGH (ref 70–99)

## 2020-12-23 LAB — D-DIMER, QUANTITATIVE: D-Dimer, Quant: 1.22 ug/mL-FEU — ABNORMAL HIGH (ref 0.00–0.50)

## 2020-12-23 LAB — PHOSPHORUS: Phosphorus: 3.9 mg/dL (ref 2.5–4.6)

## 2020-12-23 LAB — FERRITIN: Ferritin: 173 ng/mL (ref 24–336)

## 2020-12-23 LAB — C-REACTIVE PROTEIN: CRP: 7 mg/dL — ABNORMAL HIGH (ref <1.0)

## 2020-12-23 NOTE — Evaluation (Signed)
Occupational Therapy Evaluation Patient Details Name: [name] MRN: [id number] DOB: [birth year] Today's Date: 12/23/2020    History of Present Illness Pt is a 76 y/o male admitted secondary to SOB and hypoxia from COVID. PMH includes CVA, HTN, TIA, DM, and R TKA.   Clinical Impression   PTA patient was living in a private residence with his spouse. Patient reports completing ADLs/IADLs with Mod I and use of RW in home and [facility] in community dwellings. Patient does not drive reporting wife provides transportation. Patient reports that his wife also tested COVID+. Patient currently functioning slightly below baseline limited by decreased strength, decreased cardiopulmonary status requiring up to 5L O2 via [place], and decreased activity tolerance requiring Min guard to Min A grossly for ADLs, ADL transfers and short-distance functional mobility with use of RW. Patient would benefit from continued acute OT services to maximize safety and independence with self-care tasks in prep for d/c home. Recommendation for HHOT and 24hr supervision/assist.     Follow Up Recommendations  Home health OT;Supervision/Assistance - 24 hour    Equipment Recommendations  None recommended by OT (Patient has necessary DME)    Recommendations for Other Services       Precautions / Restrictions Precautions Precautions: Fall Restrictions Weight Bearing Restrictions: No      Mobility Bed Mobility Overal bed mobility: Needs Assistance             General bed mobility comments: Patient seated in recliner upon entry.    Transfers Overall transfer level: Needs assistance Equipment used: Rolling walker (2 wheeled) Transfers: Sit to/from Stand Sit to Stand: Min assist;Min guard         General transfer comment: Min guard from elevated surfaces and Min A for low surfaces.    Balance Overall balance assessment: Needs assistance Sitting-balance support: No upper extremity supported;Feet  supported Sitting balance-Leahy Scale: Good     Standing balance support: Single extremity supported;During functional activity Standing balance-Leahy Scale: Poor Standing balance comment: Able to maintain static balance standing at sink during hand washing. Otherwise reliant on at least unilateral UE support.                           ADL either performed or assessed with clinical judgement   ADL Overall ADL's : Needs assistance/impaired     Grooming: Min guard;Standing Grooming Details (indicate cue type and reason): 1/3 grooming tasks standing at sink level with Min guard for steadying.             Lower Body Dressing: Minimal assistance;Sit to/from stand Lower Body Dressing Details (indicate cue type and reason): Patient able to doff/don footwear seated in recliner without external assist. Would likely require steadying assist to don LB clothing in sitting/standing. Toilet Transfer: Designer, television/film set Details (indicate cue type and reason): Simulated with transfer to recliner with RW and cues for hand placement.         Functional mobility during ADLs: Min guard;Rolling walker General ADL Comments: Min guard with increased time for short-distance mobility in room with RW. Patient desat to 84% on 5L O2 via [place].     Vision Baseline Vision/History: No visual deficits Patient Visual Report: No change from baseline Vision Assessment?: No apparent visual deficits     Perception     Praxis      Pertinent Vitals/Pain Pain Assessment: 0-10 Pain Score: 6  Pain Location: R shoulder (chronic) Pain Descriptors / Indicators: Aching Pain Intervention(s):  Monitored during session;Repositioned     Hand Dominance Left   Extremity/Trunk Assessment Upper Extremity Assessment Upper Extremity Assessment: Generalized weakness (Mild residual weakness in LUE)   Lower Extremity Assessment Lower Extremity Assessment: Defer to PT evaluation   Cervical / Trunk  Assessment Cervical / Trunk Assessment: Kyphotic   Communication Communication Communication: Other (comment) (Garbled speech from previous CVA)   Cognition Arousal/Alertness: Awake/alert Behavior During Therapy: WFL for tasks assessed/performed Overall Cognitive Status: No family/caregiver present to determine baseline cognitive functioning                                 General Comments: A&Ox4   General Comments  SpO2 93% on 5L O2 via [place] at rest. Patient desat to 84-85% with light activity. Poor pleth. Patient more likely saturating at 87-88%.    Exercises     Shoulder Instructions      Home Living Family/patient expects to be discharged to:: Private residence Living Arrangements: Spouse/significant other;Children Available Help at Discharge: Family;Available 24 hours/day Type of Home: House Home Access: Ramped entrance     Home Layout: One level     Bathroom Shower/Tub: Occupational psychologist: Standard     Home Equipment: Environmental consultant - 2 wheels;Wheelchair - [employer];Shower seat          Prior Functioning/Environment Level of Independence: Independent with assistive device(s)        Comments: RW in home and [facility] in community dwellings. Mod I with ADLs with AD/DME. Sit vs stand in shower for bathing. Assists wife with cooking.        OT Problem List: Decreased strength;Decreased activity tolerance;Impaired balance (sitting and/or standing);Cardiopulmonary status limiting activity      OT Treatment/Interventions: Self-care/ADL training;Therapeutic exercise;Energy conservation;DME and/or AE instruction;Therapeutic activities;Patient/family education;Balance training    OT Goals(Current goals can be found in the care plan section) Acute Rehab OT Goals Patient Stated Goal: to go home OT Goal Formulation: With patient Time For Goal Achievement: 01/06/21 Potential to Achieve Goals: Good ADL Goals Pt Will Perform Grooming: with  modified independence;standing Pt Will Perform Upper Body Dressing: with modified independence;sitting Pt Will Perform Lower Body Dressing: with modified independence;sit to/from stand Pt Will Transfer to Toilet: with modified independence;ambulating Pt Will Perform Toileting - Clothing Manipulation and hygiene: with modified independence;sit to/from stand Pt/caregiver will Perform Home Exercise Program: Increased strength;Both right and left upper extremity;With written HEP provided  OT Frequency: Min 2X/week   Barriers to D/C:            Co-evaluation              AM-PAC OT "6 Clicks" Daily Activity     Outcome Measure Help from another person eating meals?: None Help from another person taking care of personal grooming?: A Little Help from another person toileting, which includes using toliet, bedpan, or urinal?: A Little Help from another person bathing (including washing, rinsing, drying)?: A Little Help from another person to put on and taking off regular upper body clothing?: None Help from another person to put on and taking off regular lower body clothing?: A Little 6 Click Score: 20   End of Session Equipment Utilized During Treatment: Rolling walker  Activity Tolerance: Patient tolerated treatment well Patient left: in chair;with call bell/phone within reach;with chair alarm set  OT Visit Diagnosis: Unsteadiness on feet (R26.81);Muscle weakness (generalized) (M62.81)  [id number] OT Time Calculation (min): 22 min Charges:  OT General Charges $OT Visit: 1 Visit OT Evaluation $OT Eval Moderate Complexity: 1 Mod  Trung Wenzl H. OTR/L Supplemental OT, Department of rehab services [phone]  Coda Filler R H. 12/23/2020, 1:53 PM

## 2020-12-23 NOTE — Progress Notes (Signed)
PROGRESS NOTE    [name]  VPX:[id number] DOB: [birth year] DOA: 12/22/2020 PCP: Biagio Borg, MD   Brief Narrative:  [name] is a 75 y.o. male with medical history significant of stroke with chronically unbalanced gait/ambulatory dysfunction, HTN, HLD, IIDM, hypothyroidism, presented with increasing shortness of breath and hypoxia. Patient is not vaccinated for COVID-19.  He started to have symptoms and went to tested positive on 12/05/2020.  5 to 6 days prior to admission, he started to experience increasing shortness of breath and cough and he went to see his PCP prescribed him with home oxygen 3 L, doxycycline and prednisolone (allergic to prednisone), patient has been taking for the last 3 days with modest improvement.  However last night, family noticed patient's O2 saturation dropped and frequently desat below 90 s with the 3 liters.  Patient also spiked fever with T-max 101 last 3 days.  He developed diarrhea last week which resolved 3 days ago. Patient used to smoke heavily but quit 10 years ago after stroke.  He was never formally diagnosed with COPD however patient's family reported patient has developed frequent exertional dyspnea for last 6 months, with occasional wheezing. In ED: Stabilized on 94% on 4 L.  Chest x-ray showed multifocal infiltrates. Admitted for acute covid treatment.  Assessment & Plan:  Acute hypoxic respiratory failure secondary to COVID-19 pneumonia SpO2: 92 % O2 Flow Rate (L/min): 4 L/min -Continue Remdesivir, Decadron, Baricitinib initiated 12/22/20 -low threshold to discontinue Remdesivir and baricitinib early given duration of illness as he has now been positive for at least 18 days prior to admission -Inflammatory markers low - reassuring for either minimal disease/resolving disease process -Concern for ongoing fibrosis at this point -Continue early ambulation, proning, incentive spirometry, flutter valve, inhalers Recent Labs    12/22/20 1037  12/22/20 1047 12/23/20 0551  DDIMER  --  1.73* 1.22*  FERRITIN  --  212 173  LDH 392*  --   --   CRP  --  6.6* 7.0*   Remote history of smoking -No diagnosis of COPD but remarkable smoking history - high risk; would benefit for outpatient evaluation  Questionably abnormal EKG -On personal review - low voltage - auto-read as "afib/flutter 3:1" but NSR on further review -No chest pain, troponin WNL - no longer following  Chronic ambulation dysfunction -Residual symptom from last stroke -Fall precaution, PT evaluation  IIDM, controlled -Sliding scale for now with 3 unit short-acting for meal coverage Lab Results  Component Value Date   HGBA1C 6.8 (H) 02/22/2020    DVT prophylaxis: Lovenox  Code Status: FullCode Family Communication: Patient to update  Status is: Inpatient  Dispo: The patient is from: Home              Anticipated d/c is to: Home              Anticipated d/c date is: 48 to 72 hours pending clinical course              Patient currently not medically stable for discharge  Consultants:   None  Procedures:   None  Antimicrobials:  Remdesivir  Subjective: No acute issues or events overnight, respiratory status appears to be improving drastically, clinically patient feels much better at rest, still reports dyspnea with exertion but markedly improving from last few days.  Objective: Vitals:   12/22/20 1815 12/22/20 2124 12/23/20 0104 12/23/20 0441  BP: (!) 170/80 (!) 143/69 (!) 154/73 (!) 160/68  Pulse: 60 65 61 (!)  58  Resp: (!) 29 20 20 16   Temp:  99 F (37.2 C) 98.7 F (37.1 C) 97.7 F (36.5 C)  TempSrc:  Oral Oral Oral  SpO2: 92% 90% 96% 92%   No intake or output data in the 24 hours ending 12/23/20 0744 There were no vitals filed for this visit.  Examination:  General:  Pleasantly resting in bed, No acute distress. HEENT:  Normocephalic atraumatic.  Sclerae nonicteric, noninjected.  Extraocular movements intact bilaterally. Neck:   Without mass or deformity.  Trachea is midline. Lungs: Diminished breath sounds bilaterally, scant bibasilar rhonchi without overt wheeze or rales. Heart:  Regular rate and rhythm.  Without murmurs, rubs, or gallops. Abdomen:  Soft, nontender, nondistended.  Without guarding or rebound. Extremities: Without cyanosis, clubbing, edema, or obvious deformity. Vascular:  Dorsalis pedis and posterior tibial pulses palpable bilaterally. Skin:  Warm and dry, no erythema, no ulcerations.  Data Reviewed: I have personally reviewed following labs and imaging studies  CBC: Recent Labs  Lab 12/22/20 1037 12/23/20 0551  WBC 11.8* 8.2  NEUTROABS 10.3* PENDING  HGB 11.9* 10.8*  HCT 35.8* 34.4*  MCV 88.8 88.0  PLT 431* 0000000*   Basic Metabolic Panel: Recent Labs  Lab 12/22/20 1037 12/23/20 0551  NA 139 141  K 3.7 3.7  CL 105 107  CO2 21* 21*  GLUCOSE 184* 212*  BUN 16 23  CREATININE 0.78 0.78  CALCIUM 8.8* 8.5*  MG  --  1.9  PHOS  --  3.9   GFR: CrCl cannot be calculated (Unknown ideal weight.). Liver Function Tests: Recent Labs  Lab 12/22/20 1037 12/23/20 0551  AST 38 32  ALT 33 32  ALKPHOS 56 50  BILITOT 0.7 0.8  PROT 6.9 6.3*  ALBUMIN 3.0* 2.6*   No results for input(s): LIPASE, AMYLASE in the last 168 hours. No results for input(s): AMMONIA in the last 168 hours. Coagulation Profile: No results for input(s): INR, PROTIME in the last 168 hours. Cardiac Enzymes: No results for input(s): CKTOTAL, CKMB, CKMBINDEX, TROPONINI in the last 168 hours. BNP (last 3 results) No results for input(s): PROBNP in the last 8760 hours. HbA1C: No results for input(s): HGBA1C in the last 72 hours. CBG: Recent Labs  Lab 12/22/20 1718 12/22/20 2323 12/23/20 0628  GLUCAP 225* 184* 187*   Lipid Profile: Recent Labs    12/22/20 1039  TRIG 257*   Thyroid Function Tests: No results for input(s): TSH, T4TOTAL, FREET4, T3FREE, THYROIDAB in the last 72 hours. Anemia Panel: Recent  Labs    12/22/20 1047 12/23/20 0551  FERRITIN 212 173   Sepsis Labs: Recent Labs  Lab 12/22/20 0918 12/22/20 1037 12/22/20 1216  PROCALCITON  --  <0.10  --   LATICACIDVEN 2.0*  --  1.8    Recent Results (from the past 240 hour(s))  SARS Coronavirus 2 by RT PCR (hospital order, performed in [facility] hospital lab) Nasopharyngeal Nasopharyngeal Swab     Status: Abnormal   Collection Time: 12/22/20  8:26 AM   Specimen: Nasopharyngeal Swab  Result Value Ref Range Status   SARS Coronavirus 2 POSITIVE (A) NEGATIVE Final    Comment: RESULT CALLED TO, READ BACK BY AND VERIFIED WITH: K. NEWMAN RN, AT [id number] 12/23/19 BY D. VANHOOK (NOTE) SARS-CoV-2 target nucleic acids are DETECTED  SARS-CoV-2 RNA is generally detectable in upper respiratory specimens  during the acute phase of infection.  Positive results are indicative  of the presence of the identified virus, but do not rule out  bacterial infection or co-infection with other pathogens not detected by the test.  Clinical correlation with patient history and  other diagnostic information is necessary to determine patient infection status.  The expected result is negative.  Fact Sheet for Patients:   StrictlyIdeas.no   Fact Sheet for Healthcare Providers:   BankingDealers.co.za    This test is not yet approved or cleared by the [place] FDA and  has been authorized for detection and/or diagnosis of SARS-CoV-2 by FDA under an Emergency Use Authorization (EUA).  This EUA will remain in effect (meanin g this test can be used) for the duration of  the COVID-19 declaration under Section 564(b)(1) of the Act, 21 U.S.C. section 360-bbb-3(b)(1), unless the authorization is terminated or revoked sooner.  Performed at [facility], [place] [address], [place], [place] [address]   Urine culture     Status: None   Collection Time: 12/22/20 12:13 PM   Specimen: Urine, Random  Result  Value Ref Range Status   Specimen Description URINE, RANDOM  Final   Special Requests NONE  Final   Culture   Final    NO GROWTH Performed at [facility], [place] [address] [place], [place] [address]    Report Status 12/23/2020 FINAL  Final         Radiology Studies: DG Chest 2 View  Result Date: 12/22/2020 CLINICAL DATA:  Shortness of breath and cough EXAM: CHEST - 2 VIEW COMPARISON:  September 03, 2019 FINDINGS: Ill-defined airspace opacity is noted in each mid and lower lung region. There is interstitial thickening throughout the lungs as well. No consolidation. Heart size and pulmonary vascularity normal. No adenopathy. There is degenerative change in the thoracic spine. IMPRESSION: Multiple areas of ill-defined opacity bilaterally, likely atypical organism pneumonia. Check of COVID-19 status advised. Suspect a degree of underlying interstitial fibrotic type change. No consolidation. Heart size normal.  No adenopathy evident. Electronically Signed   By: Lowella Grip III M.D.   On: 12/22/2020 09:04   DG Shoulder Right Port  Result Date: 12/22/2020 CLINICAL DATA:  Shoulder pain in a 75 year old male EXAM: PORTABLE RIGHT SHOULDER COMPARISON:  Chest x-ray of the same date. FINDINGS: RIGHT shoulder is located. Moderate to marked glenohumeral degenerative changes and acromioclavicular degenerative changes. No signs of fracture. IMPRESSION: Degenerative changes about the RIGHT shoulder without signs of acute fracture or dislocation. Electronically Signed   By: Zetta Bills M.D.   On: 12/22/2020 15:08    Scheduled Meds: . amLODipine  5 mg Oral Daily  . aspirin EC  325 mg Oral Daily  . atorvastatin  80 mg Oral Daily  . baricitinib  4 mg Oral Daily  . clopidogrel  75 mg Oral Daily  . dexamethasone (DECADRON) injection  6 mg Intravenous Q24H  . enoxaparin (LOVENOX) injection  40 mg Subcutaneous Q24H  . fenofibrate  160 mg Oral Daily  . insulin aspart  0-15 Units Subcutaneous TID  WC  . insulin aspart  3 Units Subcutaneous TID WC  . Ipratropium-Albuterol  1 puff Inhalation Q6H  . labetalol  300 mg Oral BID  . levothyroxine  150 mcg Oral Daily  . mometasone-formoterol  2 puff Inhalation BID   Continuous Infusions: . remdesivir 100 mg in NS 100 mL       LOS: 1 day   Time spent: 40 min  Little Ishikawa, DO [facility]  If 7PM-7AM, please contact night-coverage [email or web address]  12/23/2020, 7:44 AM

## 2020-12-24 LAB — CBC WITH DIFFERENTIAL/PLATELET
Abs Immature Granulocytes: 0.12 10*3/uL — ABNORMAL HIGH (ref 0.00–0.07)
Basophils Absolute: 0 10*3/uL (ref 0.0–0.1)
Basophils Relative: 0 %
Eosinophils Absolute: 0 10*3/uL (ref 0.0–0.5)
Eosinophils Relative: 0 %
HCT: 32.7 % — ABNORMAL LOW (ref 39.0–52.0)
Hemoglobin: 10.6 g/dL — ABNORMAL LOW (ref 13.0–17.0)
Immature Granulocytes: 1 %
Lymphocytes Relative: 10 %
Lymphs Abs: 1 10*3/uL (ref 0.7–4.0)
MCH: 28.6 pg (ref 26.0–34.0)
MCHC: 32.4 g/dL (ref 30.0–36.0)
MCV: 88.1 fL (ref 80.0–100.0)
Monocytes Absolute: 0.9 10*3/uL (ref 0.1–1.0)
Monocytes Relative: 9 %
Neutro Abs: 8.2 10*3/uL — ABNORMAL HIGH (ref 1.7–7.7)
Neutrophils Relative %: 80 %
Platelets: 466 10*3/uL — ABNORMAL HIGH (ref 150–400)
RBC: 3.71 MIL/uL — ABNORMAL LOW (ref 4.22–5.81)
RDW: 13.9 % (ref 11.5–15.5)
WBC: 10.3 10*3/uL (ref 4.0–10.5)
nRBC: 0 % (ref 0.0–0.2)

## 2020-12-24 LAB — COMPREHENSIVE METABOLIC PANEL
ALT: 33 U/L (ref 0–44)
AST: 29 U/L (ref 15–41)
Albumin: 2.6 g/dL — ABNORMAL LOW (ref 3.5–5.0)
Alkaline Phosphatase: 49 U/L (ref 38–126)
Anion gap: 9 (ref 5–15)
BUN: 22 mg/dL (ref 8–23)
CO2: 23 mmol/L (ref 22–32)
Calcium: 8.4 mg/dL — ABNORMAL LOW (ref 8.9–10.3)
Chloride: 108 mmol/L (ref 98–111)
Creatinine, Ser: 0.78 mg/dL (ref 0.61–1.24)
GFR, Estimated: >60 mL/min (ref >60)
Glucose, Bld: 222 mg/dL — ABNORMAL HIGH (ref 70–99)
Potassium: 3.8 mmol/L (ref 3.5–5.1)
Sodium: 140 mmol/L (ref 135–145)
Total Bilirubin: 0.9 mg/dL (ref 0.3–1.2)
Total Protein: 6.1 g/dL — ABNORMAL LOW (ref 6.5–8.1)

## 2020-12-24 LAB — GLUCOSE, CAPILLARY
Glucose-Capillary: 176 mg/dL — ABNORMAL HIGH (ref 70–99)
Glucose-Capillary: 130 mg/dL — ABNORMAL HIGH (ref 70–99)

## 2020-12-24 LAB — D-DIMER, QUANTITATIVE: D-Dimer, Quant: 1.1 ug/mL-FEU — ABNORMAL HIGH (ref 0.00–0.50)

## 2020-12-24 LAB — C-REACTIVE PROTEIN: CRP: 2.8 mg/dL — ABNORMAL HIGH (ref <1.0)

## 2020-12-24 MED ORDER — PREDNISONE 10 MG PO TABS: ORAL_TABLET | ORAL | 0 refills | Status: AC

## 2020-12-24 MED ORDER — IPRATROPIUM-ALBUTEROL 20-100 MCG/ACT IN AERS
1.0000 | INHALATION_SPRAY | Freq: Three times a day (TID) | RESPIRATORY_TRACT | Status: DC
  Administered 2020-12-24: 1 via RESPIRATORY_TRACT
  Filled 2020-12-24: qty 4

## 2020-12-24 NOTE — Progress Notes (Signed)
SATURATION QUALIFICATIONS: (This note is used to comply with regulatory documentation for home oxygen)  Patient Saturations on Room Air at Rest = 93%  Patient Saturations on Room Air while Ambulating = 83%  Patient Saturations on 2 Liters of oxygen while Ambulating = 93%  Please briefly explain why patient needs home oxygen: Patient was unable to maintain O2 saturation of 90% or above on room air while ambulating and desaturated to 83%. When placed on 2L nasal cannula O2 saturation was maintained at 93% for the duration of the ambulation period.

## 2020-12-24 NOTE — Discharge Summary (Signed)
Physician Discharge Summary  DAVIS AMBROSINI FXJ:[id number] DOB: Apr 01, 1946 DOA: 12/22/2020  PCP: Biagio Borg, MD  Admit date: 12/22/2020 Discharge date: 12/24/2020  Admitted From: Home Disposition: Home  Recommendations for Outpatient Follow-up:  1. Follow up with PCP in 1-2 weeks 2. Please obtain BMP/CBC in one week  Home Health: Home health Equipment/Devices: None 2 L oxygen  Discharge Condition: Stable CODE STATUS: Full Diet recommendation: Low-carb, heart healthy low-salt low-fat diet  Brief/Interim Summary: Hendryx Ricke Summeyis a 75 y.o.malewith medical history significant ofstroke with chronically unbalanced gait/ambulatory dysfunction, HTN, HLD,IIDM,hypothyroidism, presented with increasing shortness of breath and hypoxia. Patient is not vaccinated for COVID-19. He started to have symptoms and went to tested positive on 12/05/2020. 5 to 6 days prior to admission, he started to experience increasing shortness of breath and cough and he went to see his PCP prescribed him with home oxygen 3L, doxycycline and prednisolone (allergic to prednisone),patient has been taking for the last 3 days with modest improvement. However last night, family noticed patient's O2 saturation dropped and frequently desat below 90 s with the 3 liters. Patient also spiked fever with T-max 101 last 3 days. He developed diarrhea last week which resolved 3 days ago. Patient used to smoke heavily but quit 10 years ago after stroke. He was never formally diagnosed with COPD however patient's family reported patient has developed frequent exertional dyspnea for last 6 months,with occasional wheezing. In ED: Stabilized on 94% on 4 L. Chest x-ray showed multifocal infiltrates. Admitted for acute covid treatment.  Patient admitted as above with acute hypoxic respiratory failure in setting of COVID-19 pneumonia.  Patient had minimally elevated labs, minimal oxygen use upwards of 4 L nasal cannula, downtrending  appropriately currently on room air at rest.  Patient does require 2 L nasal cannula with exertion to maintain sats above 90% but otherwise feels quite well, back to baseline and otherwise stable and agreeable for discharge home.  We had a long discussion about monitoring symptoms and oxygen levels, should he become more hypoxic or have worsening symptoms he should report back to the ED but otherwise will likely continue to progress at home.  Close follow-up with PCP in the next 1 to 2 weeks as scheduled.  We discussed ongoing need for quarantine for 21 days per CDC guidelines from initial testing.  Wife at home is also sick with Covid so they can quarantine together given her Covid positive status as well.  Discharge Diagnoses:  Active Problems:   COVID-19    Discharge Instructions  Discharge Instructions    Call MD for:  difficulty breathing, headache or visual disturbances   Complete by: As directed    Diet - low sodium heart healthy   Complete by: As directed    Increase activity slowly   Complete by: As directed      Allergies as of 12/24/2020      Reactions   Latex Rash, Other (See Comments)   Skin gets "blood raw and turns into sores"   Codeine Other (See Comments)   Headaches    Hct [hydrochlorothiazide] Other (See Comments)   Hypercalcemia   Metformin And Related Diarrhea, Other (See Comments)   Cannot tolerate in higher doses- has to drop from 2,000 mg daily to 1,500 mg daily to STOP the diarrhea that was occurring   Naproxen Diarrhea   Prednisone Diarrhea   Zolpidem Tartrate Other (See Comments)   Hallucinations   Adhesive [tape] Rash, Other (See Comments)   Redness, also (thinks  he can tolerate paper tape)   Penicillins Rash, Other (See Comments)   Welts Has patient had a PCN reaction causing immediate rash, facial/tongue/throat swelling, SOB or lightheadedness with hypotension: Yes Has patient had a PCN reaction causing severe rash involving mucus membranes or skin  necrosis: No Has patient had a PCN reaction that required hospitalization: No Has patient had a PCN reaction occurring within the last 10 years: No If all of the above answers are "NO", then may proceed with Cephalosporin use.      Medication List    TAKE these medications   Accu-Chek Guide w/Device Kit 1 Device by Does not apply route daily. Once daily E11.9   albuterol (2.5 MG/3ML) 0.083% nebulizer solution Commonly known as: PROVENTIL Take 2.5 mg by nebulization every 4 (four) hours.   amLODipine 5 MG tablet Commonly known as: NORVASC TAKE 1 TABLET BY MOUTH  DAILY   aspirin EC 325 MG tablet Take 1 tablet (325 mg total) by mouth once as needed (when stroke-like symptoms present). What changed: when to take this   atorvastatin 80 MG tablet Commonly known as: LIPITOR TAKE 1 TABLET BY MOUTH  DAILY   benzonatate 100 MG capsule Commonly known as: TESSALON Take 100 mg by mouth 2 (two) times daily.   budesonide 1 MG/2ML nebulizer solution Commonly known as: PULMICORT Take 1 mg by nebulization 2 (two) times daily.   cetirizine 10 MG tablet Commonly known as: ZYRTEC Take 1 tablet (10 mg total) by mouth daily.   clopidogrel 75 MG tablet Commonly known as: PLAVIX TAKE 1 TABLET BY MOUTH  DAILY   doxycycline 100 MG capsule Commonly known as: VIBRAMYCIN Take 100 mg by mouth 2 (two) times daily.   fenofibrate 160 MG tablet TAKE 1 TABLET BY MOUTH  DAILY   gabapentin 400 MG capsule Commonly known as: NEURONTIN TAKE 1 CAPSULE BY MOUTH 3  TIMES DAILY   glucose blood test strip 1 each by Other route 2 (two) times daily. Use to check blood sugars twice a day Dx E11.9   Accu-Chek Guide test strip Generic drug: glucose blood Use as instructed once daily E11.9   IVERMECTIN PO Take 33 mg by mouth daily.   labetalol 300 MG tablet Commonly known as: NORMODYNE TAKE 1 TABLET BY MOUTH  TWICE DAILY   Lancets Misc Use as directed once daily E11.9   levothyroxine 150 MCG  tablet Commonly known as: SYNTHROID Take 1 tablet (150 mcg total) by mouth daily.   metFORMIN 500 MG 24 hr tablet Commonly known as: GLUCOPHAGE-XR TAKE 4 TABLETS BY MOUTH  DAILY WITH BREAKFAST What changed: See the new instructions.   methylPREDNISolone 4 MG Tbpk tablet Commonly known as: MEDROL DOSEPAK Take 4-24 mg by mouth daily. Take 24 mg (6 tablets) by mouth on day one, and decrease by 4 mg once a day until finished   MSM 1500 MG Tabs Take 1,500 mg by mouth 2 (two) times daily.   predniSONE 10 MG tablet Commonly known as: DELTASONE Take 4 tablets (40 mg total) by mouth daily for 3 days, THEN 3 tablets (30 mg total) daily for 3 days, THEN 2 tablets (20 mg total) daily for 3 days, THEN 1 tablet (10 mg total) daily for 3 days. Start taking on: December 24, 2020            Durable Medical Equipment  (From admission, onward)         Start     Ordered   12/24/20 1259  DME  Oxygen  Once       Question Answer Comment  Length of Need 6 Months   Mode or (Route) Nasal cannula   Liters per Minute 2   Frequency Continuous (stationary and portable oxygen unit needed)   Oxygen delivery system Gas      12/24/20 1303          Allergies  Allergen Reactions  . Latex Rash and Other (See Comments)    Skin gets "blood raw and turns into sores"  . Codeine Other (See Comments)    Headaches   . Hct [Hydrochlorothiazide] Other (See Comments)    Hypercalcemia   . Metformin And Related Diarrhea and Other (See Comments)    Cannot tolerate in higher doses- has to drop from 2,000 mg daily to 1,500 mg daily to STOP the diarrhea that was occurring  . Naproxen Diarrhea  . Prednisone Diarrhea  . Zolpidem Tartrate Other (See Comments)    Hallucinations   . Adhesive [Tape] Rash and Other (See Comments)    Redness, also (thinks he can tolerate paper tape)  . Penicillins Rash and Other (See Comments)    Welts Has patient had a PCN reaction causing immediate rash, facial/tongue/throat  swelling, SOB or lightheadedness with hypotension: Yes Has patient had a PCN reaction causing severe rash involving mucus membranes or skin necrosis: No Has patient had a PCN reaction that required hospitalization: No Has patient had a PCN reaction occurring within the last 10 years: No If all of the above answers are "NO", then may proceed with Cephalosporin use.     Consultations: None  Procedures/Studies: DG Chest 2 View  Result Date: 12/22/2020 CLINICAL DATA:  Shortness of breath and cough EXAM: CHEST - 2 VIEW COMPARISON:  September 03, 2019 FINDINGS: Ill-defined airspace opacity is noted in each mid and lower lung region. There is interstitial thickening throughout the lungs as well. No consolidation. Heart size and pulmonary vascularity normal. No adenopathy. There is degenerative change in the thoracic spine. IMPRESSION: Multiple areas of ill-defined opacity bilaterally, likely atypical organism pneumonia. Check of COVID-19 status advised. Suspect a degree of underlying interstitial fibrotic type change. No consolidation. Heart size normal.  No adenopathy evident. Electronically Signed   By: Lowella Grip III M.D.   On: 12/22/2020 09:04   DG Shoulder Right Port  Result Date: 12/22/2020 CLINICAL DATA:  Shoulder pain in a 75 year old male EXAM: PORTABLE RIGHT SHOULDER COMPARISON:  Chest x-ray of the same date. FINDINGS: RIGHT shoulder is located. Moderate to marked glenohumeral degenerative changes and acromioclavicular degenerative changes. No signs of fracture. IMPRESSION: Degenerative changes about the RIGHT shoulder without signs of acute fracture or dislocation. Electronically Signed   By: Zetta Bills M.D.   On: 12/22/2020 15:08      Subjective: No acute issues or events overnight   Discharge Exam: Vitals:   12/24/20 0814 12/24/20 1148  BP: (!) 175/67 140/66  Pulse: (!) 57 (!) 54  Resp: 15 (!) 23  Temp: 97.7 F (36.5 C) 97.8 F (36.6 C)  SpO2: (!) 87% 92%    Vitals:   12/24/20 0000 12/24/20 0414 12/24/20 0814 12/24/20 1148  BP: (!) 157/73 (!) 167/66 (!) 175/67 140/66  Pulse: (!) 57 (!) 49 (!) 57 (!) 54  Resp: _0 (!) 23  Temp: 98.1 F (36.7 C) 98.2 F (36.8 C) 97.7 F (36.5 C) 97.8 F (36.6 C)  TempSrc: Oral Oral Oral Oral  SpO2: 92% 93% (!) 87% 92%    General: Pt is  alert, awake, not in acute distress Cardiovascular: RRR, S1/S2 +, no rubs, no gallops Respiratory: CTA bilaterally, no wheezing, no rhonchi Abdominal: Soft, NT, ND, bowel sounds + Extremities: no edema, no cyanosis    The results of significant diagnostics from this hospitalization (including imaging, microbiology, ancillary and laboratory) are listed below for reference.     Microbiology: Recent Results (from the past 240 hour(s))  SARS Coronavirus 2 by RT PCR (hospital order, performed in [facility] hospital lab) Nasopharyngeal Nasopharyngeal Swab     Status: Abnormal   Collection Time: 12/22/20  8:26 AM   Specimen: Nasopharyngeal Swab  Result Value Ref Range Status   SARS Coronavirus 2 POSITIVE (A) NEGATIVE Final    Comment: RESULT CALLED TO, READ BACK BY AND VERIFIED WITH: K. NEWMAN RN, AT 3419 12/23/19 BY D. VANHOOK (NOTE) SARS-CoV-2 target nucleic acids are DETECTED  SARS-CoV-2 RNA is generally detectable in upper respiratory specimens  during the acute phase of infection.  Positive results are indicative  of the presence of the identified virus, but do not rule out bacterial infection or co-infection with other pathogens not detected by the test.  Clinical correlation with patient history and  other diagnostic information is necessary to determine patient infection status.  The expected result is negative.  Fact Sheet for Patients:   StrictlyIdeas.no   Fact Sheet for Healthcare Providers:   BankingDealers.co.za    This test is not yet approved or cleared by the [place] FDA and  has been  authorized for detection and/or diagnosis of SARS-CoV-2 by FDA under an Emergency Use Authorization (EUA).  This EUA will remain in effect (meanin g this test can be used) for the duration of  the COVID-19 declaration under Section 564(b)(1) of the Act, 21 U.S.C. section 360-bbb-3(b)(1), unless the authorization is terminated or revoked sooner.  Performed at [facility], [place] [address], [place], [place] [address]   Blood Culture (routine x 2)     Status: None (Preliminary result)   Collection Time: 12/22/20  9:18 AM   Specimen: BLOOD LEFT HAND  Result Value Ref Range Status   Specimen Description BLOOD LEFT HAND  Final   Special Requests   Final    BOTTLES DRAWN AEROBIC AND ANAEROBIC Blood Culture adequate volume   Culture   Final    NO GROWTH < 24 HOURS Performed at [facility], [place] [address] [place], [place] [address]    Report Status PENDING  Incomplete  Urine culture     Status: None   Collection Time: 12/22/20 12:13 PM   Specimen: Urine, Random  Result Value Ref Range Status   Specimen Description URINE, RANDOM  Final   Special Requests NONE  Final   Culture   Final    NO GROWTH Performed at [facility], [place] [address] [place], [place] [address]    Report Status 12/23/2020 FINAL  Final  Blood Culture (routine x 2)     Status: None (Preliminary result)   Collection Time: 12/22/20 12:15 PM   Specimen: BLOOD  Result Value Ref Range Status   Specimen Description BLOOD LEFT ANTECUBITAL  Final   Special Requests   Final    BOTTLES DRAWN AEROBIC AND ANAEROBIC Blood Culture adequate volume   Culture   Final    NO GROWTH < 24 HOURS Performed at [facility], [place] [address] [place], [place] [address]    Report Status PENDING  Incomplete     Labs: BNP (  last 3 results) Recent Labs    12/22/20 1339  BNP 409.7*   Basic Metabolic Panel: Recent Labs  Lab 12/22/20 1037 12/23/20 0551 12/24/20 0202  NA 139 141 140  K 3.7 3.7 3.8  CL 105  107 108  CO2 21* 21* 23  GLUCOSE 184* 212* 222*  BUN _0 CREATININE 0.78 0.78 0.78  CALCIUM 8.8* 8.5* 8.4*  MG  --  1.9  --   PHOS  --  3.9  --    Liver Function Tests: Recent Labs  Lab 12/22/20 1037 12/23/20 0551 12/24/20 0202  AST 38 32 29  ALT 33 32 33  ALKPHOS 56 50 49  BILITOT 0.7 0.8 0.9  PROT 6.9 6.3* 6.1*  ALBUMIN 3.0* 2.6* 2.6*   No results for input(s): LIPASE, AMYLASE in the last 168 hours. No results for input(s): AMMONIA in the last 168 hours. CBC: Recent Labs  Lab 12/22/20 1037 12/23/20 0551 12/24/20 0202  WBC 11.8* 8.2 10.3  NEUTROABS 10.3* 7.1 8.2*  HGB 11.9* 10.8* 10.6*  HCT 35.8* 34.4* 32.7*  MCV 88.8 88.0 88.1  PLT 431* 420* 466*   Cardiac Enzymes: No results for input(s): CKTOTAL, CKMB, CKMBINDEX, TROPONINI in the last 168 hours. BNP: Invalid input(s): POCBNP CBG: Recent Labs  Lab 12/23/20 1251 12/23/20 1622 12/23/20 2136 12/24/20 0632 12/24/20 1214  GLUCAP 166* 170* 201* 176* 130*   D-Dimer Recent Labs    12/23/20 0551 12/24/20 0202  DDIMER 1.22* 1.10*   Hgb A1c No results for input(s): HGBA1C in the last 72 hours. Lipid Profile Recent Labs    12/22/20 1039  TRIG 257*   Thyroid function studies No results for input(s): TSH, T4TOTAL, T3FREE, THYROIDAB in the last 72 hours.  Invalid input(s): FREET3 Anemia work up Recent Labs    12/22/20 1047 12/23/20 0551  FERRITIN 212 173   Urinalysis    Component Value Date/Time   COLORURINE AMBER (A) 12/22/2020 1235   APPEARANCEUR CLOUDY (A) 12/22/2020 1235   LABSPEC 1.019 12/22/2020 1235   PHURINE 8.0 12/22/2020 [place] 12/22/2020 [place] 08/31/2019 1653   HGBUR NEGATIVE 12/22/2020 [place] 12/22/2020 [place] 12/22/2020 1235   PROTEINUR 100 (A) 12/22/2020 1235   UROBILINOGEN 0.2 08/31/2019 1653   NITRITE NEGATIVE 12/22/2020 1235   LEUKOCYTESUR NEGATIVE 12/22/2020 1235   Sepsis Labs Invalid  input(s): PROCALCITONIN,  WBC,  LACTICIDVEN Microbiology Recent Results (from the past 240 hour(s))  SARS Coronavirus 2 by RT PCR (hospital order, performed in [place] hospital lab) Nasopharyngeal Nasopharyngeal Swab     Status: Abnormal   Collection Time: 12/22/20  8:26 AM   Specimen: Nasopharyngeal Swab  Result Value Ref Range Status   SARS Coronavirus 2 POSITIVE (A) NEGATIVE Final    Comment: RESULT CALLED TO, READ BACK BY AND VERIFIED WITH: K. NEWMAN RN, AT 3532 12/23/19 BY D. VANHOOK (NOTE) SARS-CoV-2 target nucleic acids are DETECTED  SARS-CoV-2 RNA is generally detectable in upper respiratory specimens  during the acute phase of infection.  Positive results are indicative  of the presence of the identified virus, but do not rule out bacterial infection or co-infection with other pathogens not detected by the test.  Clinical correlation with patient history and  other diagnostic information is necessary to determine patient infection status.  The expected result is negative.  Fact Sheet for Patients:   StrictlyIdeas.no   Fact Sheet for Healthcare Providers:   BankingDealers.co.za  This test is not yet approved or cleared by the [place] and  has been authorized for detection and/or diagnosis of SARS-CoV-2 by FDA under an Emergency Use Authorization (EUA).  This EUA will remain in effect (meanin g this test can be used) for the duration of  the COVID-19 declaration under Section 564(b)(1) of the Act, 21 U.S.C. section 360-bbb-3(b)(1), unless the authorization is terminated or revoked sooner.  Performed at [facility], [place] [address] [place], [place] [address]   Blood Culture (routine x 2)     Status: None (Preliminary result)   Collection Time: 12/22/20  9:18 AM   Specimen: BLOOD LEFT HAND  Result Value Ref Range Status   Specimen Description BLOOD LEFT HAND  Final   Special Requests   Final     BOTTLES DRAWN AEROBIC AND ANAEROBIC Blood Culture adequate volume   Culture   Final    NO GROWTH < 24 HOURS Performed at [facility], [place] [address], [place], [place] [address]    Report Status PENDING  Incomplete  Urine culture     Status: None   Collection Time: 12/22/20 12:13 PM   Specimen: Urine, Random  Result Value Ref Range Status   Specimen Description URINE, RANDOM  Final   Special Requests NONE  Final   Culture   Final    NO GROWTH Performed at [facility], [place] [address], [place], [place] [address]    Report Status 12/23/2020 FINAL  Final  Blood Culture (routine x 2)     Status: None (Preliminary result)   Collection Time: 12/22/20 12:15 PM   Specimen: BLOOD  Result Value Ref Range Status   Specimen Description BLOOD LEFT ANTECUBITAL  Final   Special Requests   Final    BOTTLES DRAWN AEROBIC AND ANAEROBIC Blood Culture adequate volume   Culture   Final    NO GROWTH < 24 HOURS Performed at [facility], [place] [address], [place], [place] [address]    Report Status PENDING  Incomplete     Time coordinating discharge: Over 30 minutes  SIGNED:   Little Ishikawa, DO [facility] 12/24/2020, 1:10 PM Pager   If 7PM-7AM, please contact night-coverage [email or web address]

## 2020-12-24 NOTE — Progress Notes (Signed)
Pt was able to maintain O2 sats >90% on 2L [place] while ambulating

## 2020-12-24 NOTE — TOC Transition Note (Signed)
Transition of Care [facility]) - CM/SW Discharge Note   Patient Details  Name: [name] MRN: [id number] Date of Birth: 1946-01-18  [facility] CM/SW Contact:  Carles Collet, RN Phone Number: 12/24/2020, 1:29 PM   Clinical Narrative:   Damaris Schooner w patient over the phone. Discussed DC plan. He would like [facility] for [facility] services, they were able to start for 14 days. Referral accepted by [facility]. Home oxygen arranged through [employer] who will be bring portable concentrator to the unit and metal talk to go home w patient. Patient states that he has a ride home.     Final next level of care: [place] Barriers to Discharge: No Barriers Identified   Patient Goals and CMS Choice Patient states their goals for this hospitalization and ongoing recovery are:: to go home CMS Medicare.gov Compare Post Acute Care list provided to:: Patient Choice offered to / list presented to : Patient  Discharge Placement                       Discharge Plan and Services                DME Arranged: Oxygen DME Agency: [employer] Date DME Agency Contacted: 12/24/20 Time DME Agency Contacted: [id number] Representative spoke with at DME Agency: [place]: PT,OT [place]: [place] Date [place]: 12/24/20 Time [place]: [place] Representative spoke with at [place]: [place] ([place]) Interventions     Readmission Risk Interventions No flowsheet data found.

## 2020-12-24 NOTE — Progress Notes (Signed)
Discharge instructions (including medications) discussed with and copy provided to patient. Patient discharged to the care of his daughter and provided home O2 for home. Patient discharged in wheelchair and all belongings sent with patient

## 2020-12-27 LAB — CULTURE, BLOOD (ROUTINE X 2)
Culture: NO GROWTH
Culture: NO GROWTH
Special Requests: ADEQUATE
Special Requests: ADEQUATE

## 2021-01-09 ENCOUNTER — Other Ambulatory Visit: Payer: Self-pay | Admitting: Internal Medicine

## 2021-01-09 NOTE — Telephone Encounter (Signed)
Please refill as per office routine med refill policy (all routine meds refilled for 3 mo or monthly per pt preference up to one year from last visit, then month to month grace period for 3 mo, then further med refills will have to be denied)  

## 2021-01-22 ENCOUNTER — Other Ambulatory Visit: Payer: Self-pay | Admitting: Internal Medicine

## 2021-01-22 NOTE — Telephone Encounter (Signed)
Please refill as per office routine med refill policy (all routine meds refilled for 3 mo or monthly per pt preference up to one year from last visit, then month to month grace period for 3 mo, then further med refills will have to be denied)  

## 2021-02-01 ENCOUNTER — Other Ambulatory Visit: Payer: Self-pay | Admitting: Internal Medicine

## 2021-03-07 ENCOUNTER — Telehealth: Payer: Self-pay | Admitting: Internal Medicine

## 2021-03-07 NOTE — Telephone Encounter (Signed)
LVM for pt to rtn my call to schedule AWV with [facility]. Please schedule AWV if pt calls the office  

## 2021-03-26 ENCOUNTER — Other Ambulatory Visit: Payer: Self-pay | Admitting: Internal Medicine

## 2021-04-04 ENCOUNTER — Other Ambulatory Visit: Payer: Self-pay | Admitting: Internal Medicine

## 2021-04-05 MED ORDER — CLOPIDOGREL BISULFATE 75 MG PO TABS
75.0000 mg | ORAL_TABLET | Freq: Every day | ORAL | 0 refills | Status: DC
Start: 2021-04-05 — End: 2023-04-12

## 2021-04-05 MED ORDER — ATORVASTATIN CALCIUM 80 MG PO TABS: 80.0000 mg | ORAL_TABLET | Freq: Every day | ORAL | 0 refills | Status: AC

## 2021-04-05 NOTE — Telephone Encounter (Signed)
Please to let pt know -  lipitor and plavix x 1 mo done erx to prevo drug  Unable to send 3 mo rx due to rx refill policy  Please to make ROV for further refills

## 2021-05-04 DIAGNOSIS — J4540 Moderate persistent asthma, uncomplicated: Secondary | ICD-10-CM | POA: Diagnosis not present

## 2021-05-20 DIAGNOSIS — G8929 Other chronic pain: Secondary | ICD-10-CM | POA: Diagnosis not present

## 2021-05-20 DIAGNOSIS — M549 Dorsalgia, unspecified: Secondary | ICD-10-CM | POA: Diagnosis not present

## 2021-06-03 DIAGNOSIS — J4540 Moderate persistent asthma, uncomplicated: Secondary | ICD-10-CM | POA: Diagnosis not present

## 2021-06-19 DIAGNOSIS — M549 Dorsalgia, unspecified: Secondary | ICD-10-CM | POA: Diagnosis not present

## 2021-06-19 DIAGNOSIS — G8929 Other chronic pain: Secondary | ICD-10-CM | POA: Diagnosis not present

## 2021-07-04 DIAGNOSIS — J4540 Moderate persistent asthma, uncomplicated: Secondary | ICD-10-CM | POA: Diagnosis not present

## 2021-07-20 DIAGNOSIS — M549 Dorsalgia, unspecified: Secondary | ICD-10-CM | POA: Diagnosis not present

## 2021-07-20 DIAGNOSIS — G8929 Other chronic pain: Secondary | ICD-10-CM | POA: Diagnosis not present

## 2021-07-25 DIAGNOSIS — E039 Hypothyroidism, unspecified: Secondary | ICD-10-CM | POA: Diagnosis not present

## 2021-07-25 DIAGNOSIS — J9691 Respiratory failure, unspecified with hypoxia: Secondary | ICD-10-CM | POA: Diagnosis not present

## 2021-07-25 DIAGNOSIS — I10 Essential (primary) hypertension: Secondary | ICD-10-CM | POA: Diagnosis not present

## 2021-07-25 DIAGNOSIS — Z6824 Body mass index (BMI) 24.0-24.9, adult: Secondary | ICD-10-CM | POA: Diagnosis not present

## 2021-07-25 DIAGNOSIS — E118 Type 2 diabetes mellitus with unspecified complications: Secondary | ICD-10-CM | POA: Diagnosis not present

## 2021-07-25 DIAGNOSIS — Z79899 Other long term (current) drug therapy: Secondary | ICD-10-CM | POA: Diagnosis not present

## 2021-07-25 DIAGNOSIS — I69354 Hemiplegia and hemiparesis following cerebral infarction affecting left non-dominant side: Secondary | ICD-10-CM | POA: Diagnosis not present

## 2021-07-25 DIAGNOSIS — M549 Dorsalgia, unspecified: Secondary | ICD-10-CM | POA: Diagnosis not present

## 2021-07-25 DIAGNOSIS — R350 Frequency of micturition: Secondary | ICD-10-CM | POA: Diagnosis not present

## 2021-07-25 DIAGNOSIS — G8929 Other chronic pain: Secondary | ICD-10-CM | POA: Diagnosis not present

## 2021-07-25 DIAGNOSIS — E785 Hyperlipidemia, unspecified: Secondary | ICD-10-CM | POA: Diagnosis not present

## 2021-07-27 ENCOUNTER — Other Ambulatory Visit: Payer: Self-pay | Admitting: Internal Medicine

## 2021-08-04 DIAGNOSIS — J4540 Moderate persistent asthma, uncomplicated: Secondary | ICD-10-CM | POA: Diagnosis not present

## 2021-08-20 DIAGNOSIS — M549 Dorsalgia, unspecified: Secondary | ICD-10-CM | POA: Diagnosis not present

## 2021-08-20 DIAGNOSIS — G8929 Other chronic pain: Secondary | ICD-10-CM | POA: Diagnosis not present

## 2021-08-21 DIAGNOSIS — I700 Atherosclerosis of aorta: Secondary | ICD-10-CM | POA: Diagnosis not present

## 2021-08-21 DIAGNOSIS — R918 Other nonspecific abnormal finding of lung field: Secondary | ICD-10-CM | POA: Diagnosis not present

## 2021-08-21 DIAGNOSIS — R911 Solitary pulmonary nodule: Secondary | ICD-10-CM | POA: Diagnosis not present

## 2021-08-21 DIAGNOSIS — Z8616 Personal history of COVID-19: Secondary | ICD-10-CM | POA: Diagnosis not present

## 2021-08-21 DIAGNOSIS — K449 Diaphragmatic hernia without obstruction or gangrene: Secondary | ICD-10-CM | POA: Diagnosis not present

## 2021-08-21 DIAGNOSIS — J439 Emphysema, unspecified: Secondary | ICD-10-CM | POA: Diagnosis not present

## 2021-09-03 DIAGNOSIS — J4540 Moderate persistent asthma, uncomplicated: Secondary | ICD-10-CM | POA: Diagnosis not present

## 2021-09-07 DIAGNOSIS — R911 Solitary pulmonary nodule: Secondary | ICD-10-CM | POA: Diagnosis not present

## 2021-09-07 DIAGNOSIS — J301 Allergic rhinitis due to pollen: Secondary | ICD-10-CM | POA: Diagnosis not present

## 2021-09-07 DIAGNOSIS — Z23 Encounter for immunization: Secondary | ICD-10-CM | POA: Diagnosis not present

## 2021-09-07 DIAGNOSIS — R5383 Other fatigue: Secondary | ICD-10-CM | POA: Diagnosis not present

## 2021-09-07 DIAGNOSIS — J4540 Moderate persistent asthma, uncomplicated: Secondary | ICD-10-CM | POA: Diagnosis not present

## 2021-09-07 DIAGNOSIS — R0600 Dyspnea, unspecified: Secondary | ICD-10-CM | POA: Diagnosis not present

## 2021-09-07 DIAGNOSIS — Z8616 Personal history of COVID-19: Secondary | ICD-10-CM | POA: Diagnosis not present

## 2021-09-19 DIAGNOSIS — M549 Dorsalgia, unspecified: Secondary | ICD-10-CM | POA: Diagnosis not present

## 2021-09-19 DIAGNOSIS — G8929 Other chronic pain: Secondary | ICD-10-CM | POA: Diagnosis not present

## 2021-09-28 DIAGNOSIS — J4540 Moderate persistent asthma, uncomplicated: Secondary | ICD-10-CM | POA: Diagnosis not present

## 2021-09-28 DIAGNOSIS — R0600 Dyspnea, unspecified: Secondary | ICD-10-CM | POA: Diagnosis not present

## 2021-09-28 DIAGNOSIS — R911 Solitary pulmonary nodule: Secondary | ICD-10-CM | POA: Diagnosis not present

## 2021-09-28 DIAGNOSIS — R062 Wheezing: Secondary | ICD-10-CM | POA: Diagnosis not present

## 2021-10-04 DIAGNOSIS — J4540 Moderate persistent asthma, uncomplicated: Secondary | ICD-10-CM | POA: Diagnosis not present

## 2021-10-05 DIAGNOSIS — R0600 Dyspnea, unspecified: Secondary | ICD-10-CM | POA: Diagnosis not present

## 2021-10-05 DIAGNOSIS — R911 Solitary pulmonary nodule: Secondary | ICD-10-CM | POA: Diagnosis not present

## 2021-10-05 DIAGNOSIS — R5383 Other fatigue: Secondary | ICD-10-CM | POA: Diagnosis not present

## 2021-10-05 DIAGNOSIS — J301 Allergic rhinitis due to pollen: Secondary | ICD-10-CM | POA: Diagnosis not present

## 2021-10-05 DIAGNOSIS — Z8616 Personal history of COVID-19: Secondary | ICD-10-CM | POA: Diagnosis not present

## 2021-10-05 DIAGNOSIS — J4540 Moderate persistent asthma, uncomplicated: Secondary | ICD-10-CM | POA: Diagnosis not present

## 2021-10-20 DIAGNOSIS — G8929 Other chronic pain: Secondary | ICD-10-CM | POA: Diagnosis not present

## 2021-10-20 DIAGNOSIS — M549 Dorsalgia, unspecified: Secondary | ICD-10-CM | POA: Diagnosis not present

## 2021-10-26 DIAGNOSIS — E039 Hypothyroidism, unspecified: Secondary | ICD-10-CM | POA: Diagnosis not present

## 2021-10-26 DIAGNOSIS — G8929 Other chronic pain: Secondary | ICD-10-CM | POA: Diagnosis not present

## 2021-10-26 DIAGNOSIS — Z6824 Body mass index (BMI) 24.0-24.9, adult: Secondary | ICD-10-CM | POA: Diagnosis not present

## 2021-10-26 DIAGNOSIS — I10 Essential (primary) hypertension: Secondary | ICD-10-CM | POA: Diagnosis not present

## 2021-10-26 DIAGNOSIS — M549 Dorsalgia, unspecified: Secondary | ICD-10-CM | POA: Diagnosis not present

## 2021-10-26 DIAGNOSIS — Z79899 Other long term (current) drug therapy: Secondary | ICD-10-CM | POA: Diagnosis not present

## 2021-10-26 DIAGNOSIS — E785 Hyperlipidemia, unspecified: Secondary | ICD-10-CM | POA: Diagnosis not present

## 2021-10-26 DIAGNOSIS — I69354 Hemiplegia and hemiparesis following cerebral infarction affecting left non-dominant side: Secondary | ICD-10-CM | POA: Diagnosis not present

## 2021-10-26 DIAGNOSIS — E118 Type 2 diabetes mellitus with unspecified complications: Secondary | ICD-10-CM | POA: Diagnosis not present

## 2021-10-26 DIAGNOSIS — R350 Frequency of micturition: Secondary | ICD-10-CM | POA: Diagnosis not present

## 2021-11-03 DIAGNOSIS — J4540 Moderate persistent asthma, uncomplicated: Secondary | ICD-10-CM | POA: Diagnosis not present

## 2021-11-19 DIAGNOSIS — G8929 Other chronic pain: Secondary | ICD-10-CM | POA: Diagnosis not present

## 2021-11-19 DIAGNOSIS — M549 Dorsalgia, unspecified: Secondary | ICD-10-CM | POA: Diagnosis not present

## 2021-12-15 ENCOUNTER — Other Ambulatory Visit: Payer: Self-pay | Admitting: Internal Medicine

## 2021-12-15 NOTE — Telephone Encounter (Signed)
Please refill as per office routine med refill policy (all routine meds to be refilled for 3 mo or monthly (per pt preference) up to one year from last visit, then month to month grace period for 3 mo, then further med refills will have to be denied) ? ?

## 2021-12-20 DIAGNOSIS — G8929 Other chronic pain: Secondary | ICD-10-CM | POA: Diagnosis not present

## 2021-12-20 DIAGNOSIS — M549 Dorsalgia, unspecified: Secondary | ICD-10-CM | POA: Diagnosis not present

## 2021-12-28 DIAGNOSIS — R911 Solitary pulmonary nodule: Secondary | ICD-10-CM | POA: Diagnosis not present

## 2021-12-28 DIAGNOSIS — Z8616 Personal history of COVID-19: Secondary | ICD-10-CM | POA: Diagnosis not present

## 2021-12-28 DIAGNOSIS — J4540 Moderate persistent asthma, uncomplicated: Secondary | ICD-10-CM | POA: Diagnosis not present

## 2021-12-28 DIAGNOSIS — J301 Allergic rhinitis due to pollen: Secondary | ICD-10-CM | POA: Diagnosis not present

## 2021-12-28 DIAGNOSIS — R0600 Dyspnea, unspecified: Secondary | ICD-10-CM | POA: Diagnosis not present

## 2021-12-28 DIAGNOSIS — R5383 Other fatigue: Secondary | ICD-10-CM | POA: Diagnosis not present

## 2021-12-29 DIAGNOSIS — Z0000 Encounter for general adult medical examination without abnormal findings: Secondary | ICD-10-CM | POA: Diagnosis not present

## 2021-12-29 DIAGNOSIS — Z9181 History of falling: Secondary | ICD-10-CM | POA: Diagnosis not present

## 2021-12-29 DIAGNOSIS — E785 Hyperlipidemia, unspecified: Secondary | ICD-10-CM | POA: Diagnosis not present

## 2021-12-29 DIAGNOSIS — Z139 Encounter for screening, unspecified: Secondary | ICD-10-CM | POA: Diagnosis not present

## 2022-01-05 ENCOUNTER — Other Ambulatory Visit: Payer: Self-pay | Admitting: Internal Medicine

## 2022-01-31 DIAGNOSIS — E039 Hypothyroidism, unspecified: Secondary | ICD-10-CM | POA: Diagnosis not present

## 2022-01-31 DIAGNOSIS — I69354 Hemiplegia and hemiparesis following cerebral infarction affecting left non-dominant side: Secondary | ICD-10-CM | POA: Diagnosis not present

## 2022-01-31 DIAGNOSIS — E1169 Type 2 diabetes mellitus with other specified complication: Secondary | ICD-10-CM | POA: Diagnosis not present

## 2022-01-31 DIAGNOSIS — M5412 Radiculopathy, cervical region: Secondary | ICD-10-CM | POA: Diagnosis not present

## 2022-01-31 DIAGNOSIS — J45909 Unspecified asthma, uncomplicated: Secondary | ICD-10-CM | POA: Diagnosis not present

## 2022-01-31 DIAGNOSIS — I10 Essential (primary) hypertension: Secondary | ICD-10-CM | POA: Diagnosis not present

## 2022-01-31 DIAGNOSIS — Z79899 Other long term (current) drug therapy: Secondary | ICD-10-CM | POA: Diagnosis not present

## 2022-01-31 DIAGNOSIS — E785 Hyperlipidemia, unspecified: Secondary | ICD-10-CM | POA: Diagnosis not present

## 2022-02-26 ENCOUNTER — Other Ambulatory Visit: Payer: Self-pay | Admitting: Internal Medicine

## 2022-03-26 DIAGNOSIS — M47819 Spondylosis without myelopathy or radiculopathy, site unspecified: Secondary | ICD-10-CM | POA: Diagnosis not present

## 2022-03-26 DIAGNOSIS — M47812 Spondylosis without myelopathy or radiculopathy, cervical region: Secondary | ICD-10-CM | POA: Diagnosis not present

## 2022-03-26 DIAGNOSIS — M5412 Radiculopathy, cervical region: Secondary | ICD-10-CM | POA: Diagnosis not present

## 2022-03-26 DIAGNOSIS — Z981 Arthrodesis status: Secondary | ICD-10-CM | POA: Diagnosis not present

## 2022-03-26 DIAGNOSIS — M542 Cervicalgia: Secondary | ICD-10-CM | POA: Insufficient documentation

## 2022-03-26 DIAGNOSIS — M50321 Other cervical disc degeneration at C4-C5 level: Secondary | ICD-10-CM | POA: Diagnosis not present

## 2022-03-26 HISTORY — DX: Cervicalgia: M54.2

## 2022-03-29 DIAGNOSIS — R911 Solitary pulmonary nodule: Secondary | ICD-10-CM | POA: Diagnosis not present

## 2022-03-29 DIAGNOSIS — R5383 Other fatigue: Secondary | ICD-10-CM | POA: Diagnosis not present

## 2022-03-29 DIAGNOSIS — J301 Allergic rhinitis due to pollen: Secondary | ICD-10-CM | POA: Diagnosis not present

## 2022-03-29 DIAGNOSIS — J4540 Moderate persistent asthma, uncomplicated: Secondary | ICD-10-CM | POA: Diagnosis not present

## 2022-03-29 DIAGNOSIS — R0600 Dyspnea, unspecified: Secondary | ICD-10-CM | POA: Diagnosis not present

## 2022-03-29 DIAGNOSIS — Z8616 Personal history of COVID-19: Secondary | ICD-10-CM | POA: Diagnosis not present

## 2022-04-24 DIAGNOSIS — R0600 Dyspnea, unspecified: Secondary | ICD-10-CM | POA: Diagnosis not present

## 2022-05-03 DIAGNOSIS — J4540 Moderate persistent asthma, uncomplicated: Secondary | ICD-10-CM | POA: Diagnosis not present

## 2022-05-03 DIAGNOSIS — R911 Solitary pulmonary nodule: Secondary | ICD-10-CM | POA: Diagnosis not present

## 2022-05-03 DIAGNOSIS — R0600 Dyspnea, unspecified: Secondary | ICD-10-CM | POA: Diagnosis not present

## 2022-05-03 DIAGNOSIS — R5383 Other fatigue: Secondary | ICD-10-CM | POA: Diagnosis not present

## 2022-05-03 DIAGNOSIS — Z8616 Personal history of COVID-19: Secondary | ICD-10-CM | POA: Diagnosis not present

## 2022-05-03 DIAGNOSIS — J301 Allergic rhinitis due to pollen: Secondary | ICD-10-CM | POA: Diagnosis not present

## 2022-05-07 DIAGNOSIS — E785 Hyperlipidemia, unspecified: Secondary | ICD-10-CM | POA: Diagnosis not present

## 2022-05-07 DIAGNOSIS — I10 Essential (primary) hypertension: Secondary | ICD-10-CM | POA: Diagnosis not present

## 2022-05-07 DIAGNOSIS — E1169 Type 2 diabetes mellitus with other specified complication: Secondary | ICD-10-CM | POA: Diagnosis not present

## 2022-05-07 DIAGNOSIS — G8929 Other chronic pain: Secondary | ICD-10-CM | POA: Diagnosis not present

## 2022-05-07 DIAGNOSIS — R12 Heartburn: Secondary | ICD-10-CM | POA: Diagnosis not present

## 2022-05-07 DIAGNOSIS — E039 Hypothyroidism, unspecified: Secondary | ICD-10-CM | POA: Diagnosis not present

## 2022-05-07 DIAGNOSIS — M549 Dorsalgia, unspecified: Secondary | ICD-10-CM | POA: Diagnosis not present

## 2022-05-07 DIAGNOSIS — Z79899 Other long term (current) drug therapy: Secondary | ICD-10-CM | POA: Diagnosis not present

## 2022-05-10 ENCOUNTER — Other Ambulatory Visit: Payer: Self-pay

## 2022-05-10 DIAGNOSIS — T8859XA Other complications of anesthesia, initial encounter: Secondary | ICD-10-CM | POA: Insufficient documentation

## 2022-05-13 ENCOUNTER — Other Ambulatory Visit: Payer: Self-pay | Admitting: Internal Medicine

## 2022-05-16 DIAGNOSIS — M4802 Spinal stenosis, cervical region: Secondary | ICD-10-CM | POA: Diagnosis not present

## 2022-05-16 DIAGNOSIS — M5030 Other cervical disc degeneration, unspecified cervical region: Secondary | ICD-10-CM | POA: Diagnosis not present

## 2022-05-16 DIAGNOSIS — M4722 Other spondylosis with radiculopathy, cervical region: Secondary | ICD-10-CM | POA: Diagnosis not present

## 2022-06-07 ENCOUNTER — Ambulatory Visit: Payer: Medicare Other | Admitting: Cardiology

## 2022-06-08 ENCOUNTER — Other Ambulatory Visit: Payer: Self-pay

## 2022-06-11 ENCOUNTER — Ambulatory Visit: Payer: Medicare Other | Admitting: Cardiology

## 2022-06-11 ENCOUNTER — Encounter: Payer: Self-pay | Admitting: Cardiology

## 2022-06-11 VITALS — BP 168/80 | HR 73 | Ht 71.6 in | Wt 187.6 lb

## 2022-06-11 DIAGNOSIS — G459 Transient cerebral ischemic attack, unspecified: Secondary | ICD-10-CM

## 2022-06-11 DIAGNOSIS — I10 Essential (primary) hypertension: Secondary | ICD-10-CM | POA: Diagnosis not present

## 2022-06-11 DIAGNOSIS — E782 Mixed hyperlipidemia: Secondary | ICD-10-CM

## 2022-06-11 DIAGNOSIS — I429 Cardiomyopathy, unspecified: Secondary | ICD-10-CM

## 2022-06-11 DIAGNOSIS — M4722 Other spondylosis with radiculopathy, cervical region: Secondary | ICD-10-CM

## 2022-06-11 DIAGNOSIS — R0609 Other forms of dyspnea: Secondary | ICD-10-CM

## 2022-06-11 DIAGNOSIS — E088 Diabetes mellitus due to underlying condition with unspecified complications: Secondary | ICD-10-CM | POA: Insufficient documentation

## 2022-06-11 DIAGNOSIS — M4802 Spinal stenosis, cervical region: Secondary | ICD-10-CM | POA: Insufficient documentation

## 2022-06-11 HISTORY — DX: Spinal stenosis, cervical region: M48.02

## 2022-06-11 HISTORY — DX: Cardiomyopathy, unspecified: I42.9

## 2022-06-11 HISTORY — DX: Other spondylosis with radiculopathy, cervical region: M47.22

## 2022-06-11 HISTORY — DX: Diabetes mellitus due to underlying condition with unspecified complications: E08.8

## 2022-06-11 NOTE — Patient Instructions (Signed)
Medication Instructions:  Your physician recommends that you continue on your current medications as directed. Please refer to the Current Medication list given to you today.  *If you need a refill on your cardiac medications before your next appointment, please call your pharmacy*   Lab Work: None ordered If you have labs (blood work) drawn today and your tests are completely normal, you will receive your results only by: [place] (if you have MyChart) OR A paper copy in the mail If you have any lab test that is abnormal or we need to change your treatment, we will call you to review the results.   Testing/Procedures: Your physician has requested that you have a lexiscan myoview. For further information please visit [email or web address]. Please follow instruction sheet, as given.  The test will take approximately 3 to 4 hours to complete; you may bring reading material.  If someone comes with you to your appointment, they will need to remain in the main lobby due to limited space in the testing area.   How to prepare for your Myocardial Perfusion Test: Do not eat or drink 3 hours prior to your test, except you may have water. Do not consume products containing caffeine (regular or decaffeinated) 12 hours prior to your test. (ex: coffee, chocolate, sodas, tea). Do bring a list of your current medications with you.  If not listed below, you may take your medications as normal. Do wear comfortable clothes (no dresses or overalls) and walking shoes, tennis shoes preferred (No heels or open toe shoes are allowed). Do NOT wear cologne, perfume, aftershave, or lotions (deodorant is allowed). If these instructions are not followed, your test will have to be rescheduled.    Follow-Up: At [facility], you and your health needs are our priority.  As part of our continuing mission to provide you with exceptional heart care, we have created designated Provider Care Teams.  These Care Teams  include your primary Cardiologist (physician) and Advanced Practice Providers (APPs -  Physician Assistants and Nurse Practitioners) who all work together to provide you with the care you need, when you need it.  We recommend signing up for the patient portal called "MyChart".  Sign up information is provided on this After Visit Summary.  MyChart is used to connect with patients for Virtual Visits (Telemedicine).  Patients are able to view lab/test results, encounter notes, upcoming appointments, etc.  Non-urgent messages can be sent to your provider as well.   To learn more about what you can do with MyChart, go to [email or web address].    Your next appointment:   2 month(s)  The format for your next appointment:   In Person  Provider:   Jyl Heinz, MD   Other Instructions Cardiac Nuclear Scan A cardiac nuclear scan is a test that is done to check the flow of blood to your heart. It is done when you are resting and when you are exercising. The test looks for problems such as: Not enough blood reaching a portion of the heart. The heart muscle not working as it should. You may need this test if: You have heart disease. You have had lab results that are not normal. You have had heart surgery or a balloon procedure to open up blocked arteries (angioplasty). You have chest pain. You have shortness of breath. In this test, a special dye (tracer) is put into your bloodstream. The tracer will travel to your heart. A camera will then take pictures of your heart to see  how the tracer moves through your heart. This test is usually done at a hospital and takes 2-4 hours. Tell a doctor about: Any allergies you have. All medicines you are taking, including vitamins, herbs, eye drops, creams, and over-the-counter medicines. Any problems you or family members have had with anesthetic medicines. Any blood disorders you have. Any surgeries you have had. Any medical conditions you  have. Whether you are pregnant or may be pregnant. What are the risks? Generally, this is a safe test. However, problems may occur, such as: Serious chest pain and heart attack. This is only a risk if the stress portion of the test is done. Rapid heartbeat. A feeling of warmth in your chest. This feeling usually does not last long. Allergic reaction to the tracer. What happens before the test? Ask your doctor about changing or stopping your normal medicines. This is important. Follow instructions from your doctor about what you cannot eat or drink. Remove your jewelry on the day of the test. What happens during the test? An IV tube will be inserted into one of your veins. Your doctor will give you a small amount of tracer through the IV tube. You will wait for 20-40 minutes while the tracer moves through your bloodstream. Your heart will be monitored with an electrocardiogram (ECG). You will lie down on an exam table. Pictures of your heart will be taken for about 15-20 minutes. You may also have a stress test. For this test, one of these things may be done: You will be asked to exercise on a treadmill or a stationary bike. You will be given medicines that will make your heart work harder. This is done if you are unable to exercise. When blood flow to your heart has peaked, a tracer will again be given through the IV tube. After 20-40 minutes, you will get back on the exam table. More pictures will be taken of your heart. Depending on the tracer that is used, more pictures may need to be taken 3-4 hours later. Your IV tube will be removed when the test is over. The test may vary among doctors and hospitals. What happens after the test? Ask your doctor: Whether you can return to your normal schedule, including diet, activities, and medicines. Whether you should drink more fluids. This will help to remove the tracer from your body. Drink enough fluid to keep your pee (urine) pale  yellow. Ask your doctor, or the department that is doing the test: When will my results be ready? How will I get my results? Summary A cardiac nuclear scan is a test that is done to check the flow of blood to your heart. Tell your doctor whether you are pregnant or may be pregnant. Before the test, ask your doctor about changing or stopping your normal medicines. This is important. Ask your doctor whether you can return to your normal activities. You may be asked to drink more fluids. This information is not intended to replace advice given to you by your health care provider. Make sure you discuss any questions you have with your health care provider. Document Revised: 03/04/2019 Document Reviewed: 04/28/2018 Elsevier Patient Education  [place].

## 2022-06-11 NOTE — Progress Notes (Signed)
Cardiology Office Note:    Date:  06/11/2022   ID:  [name], DOB May 10, 1946, MRN [id number]  PCP:  Janine Limbo, PA-C  Cardiologist:  Jenean Lindau, MD   Referring MD: Gardiner Rhyme, MD    ASSESSMENT:    1. DOE (dyspnea on exertion)   2. Essential hypertension   3. Mixed hyperlipidemia   4. Diabetes mellitus due to underlying condition with unspecified complications ([place])   5. TIA (transient ischemic attack)   6. Cardiomyopathy, unspecified type ([place])    PLAN:    In order of problems listed above:  Primary prevention stressed with the patient.  Importance of compliance with diet medication stressed any vocalized understanding. Essential hypertension: Blood pressure stable and diet was emphasized.  Lifestyle modification urged.  He has an element of whitecoat hypertension.  He will keep a track of his blood pressures and send them to me. Cardiomyopathy: Unclear etiology.  He denies any symptoms at this time we will do a Lexiscan sestamibi to assess this.  Further recommendations will be made based on the findings of the test. Mixed dyslipidemia and diabetes mellitus: Followed by primary care.  Diet emphasized.  Lifestyle modification urged and he promises to do better. Patient will be seen in follow-up appointment in 6 months or earlier if the patient has any concerns    Medication Adjustments/Labs and Tests Ordered: Current medicines are reviewed at length with the patient today.  Concerns regarding medicines are outlined above.  Orders Placed This Encounter  Procedures   MYOCARDIAL PERFUSION IMAGING   EKG 12-Lead   No orders of the defined types were placed in this encounter.    History of Present Illness:    [name] is a 76 y.o. male who is being seen today for the evaluation of cardiomyopathy at the request of Gardiner Rhyme, MD. patient is a pleasant 76 year old male.  He has past medical history of essential hypertension, dyslipidemia, diabetes  mellitus.  Patient underwent evaluation and had an echocardiogram done by his pulmonologist and ejection fraction was approximately 40% so he was sent here for evaluation.  At the time of my evaluation is alert awake oriented and in no distress.  He denies any chest pain orthopnea or PND.  He denies any cardiac issues in the past.  He is a former smoker.  Past Medical History:  Diagnosis Date   Abnormality of gait 03/31/2013   ALLERGIC RHINITIS 11/06/2007   Anemia, unspecified 08/22/2012   BENIGN PROSTATIC HYPERTROPHY 07/14/2007   Chronic osteomyelitis of toe of left foot ([place]) 09/12/2017   COLONIC POLYPS, HX OF 49/17/9150   Complication of anesthesia    pt states he needs to be cathed after surgery   COVID-19    Degenerative arthritis of right knee 12/12/2011   Diabetes ([place]) 02/03/2013   Diet controlled   Disequilibrium 03/31/2013   Dyspnea 08/31/2019   Encounter for well adult exam with abnormal findings 12/10/2011   Essential hypertension 07/11/2007   Qualifier: Diagnosis of  By: Elveria Royals    Former smoker 02/08/2015   GERD 07/11/2007   Hypercalcemia 08/12/2015   Hyperlipidemia    Hyperthyroidism    Hypervitaminosis D 01/14/2019   Hypothyroidism    Hypoxia    Lacunar infarction ([place]) 03/31/2013   Microalbuminuria 08/31/2019   OSTEOARTHRITIS, KNEE, RIGHT 10/27/2008   PERIPHERAL EDEMA 05/31/2009   Polyarthralgia 09/12/2017   PULMONARY NODULE 06/24/2008   granuloma   Recurrent falls 08/17/2016   Right shoulder pain 08/17/2016  Rotator cuff arthropathy 09/07/2016   Injected 09/07/2016 Repeat injection 01/07/17 Repeat injection April 20th 2018. Repeat injection 05/22/2017 Repeat injection 08/15/2017   Skin lesion 02/19/2017   Spinal stenosis in cervical region 03/31/2013   Spondylosis, cervical, with myelopathy 11/11/2012   TIA (transient ischemic attack) 08/13/2015   Upper back pain on right side 08/31/2019   Vitamin D deficiency 08/12/2015   Weight loss 08/31/2019    Past  Surgical History:  Procedure Laterality Date   ANTERIOR CERVICAL DECOMP/DISCECTOMY FUSION N/A 01/06/2013   Procedure: ANTERIOR CERVICAL DECOMPRESSION/DISCECTOMY FUSION 1 LEVEL;  Surgeon: Erline Levine, MD;  Location: [facility];  Service: Neurosurgery;  Laterality: N/A;  Cervical three-four Anterior cervical decompression/diskectomy/fusion   BACK SURGERY  1986   x 2  1986 C 3   HERNIA REPAIR  sept 1986   left knee replacement  Nov 21, 2007   NECK SURGERY     NISSEN FUNDOPLICATION  9390   right heel bone spur  1987   TOTAL KNEE ARTHROPLASTY  01/09/2012   Procedure: TOTAL KNEE ARTHROPLASTY;  Surgeon: Tobi Bastos, MD;  Location: [facility];  Service: Orthopedics;  Laterality: Right;    Current Medications: Current Meds  Medication Sig   ACCU-CHEK GUIDE test strip USE AS INSTRUCTED ONCE  DAILY   Accu-Chek Softclix Lancets lancets USE AS DIRECTED ONCE DAILY   albuterol (PROVENTIL) (2.5 MG/3ML) 0.083% nebulizer solution Take 2.5 mg by nebulization every 4 (four) hours.   albuterol (VENTOLIN HFA) 108 (90 Base) MCG/ACT inhaler Inhale 1-2 puffs into the lungs every 6 (six) hours as needed for wheezing or shortness of breath.   amLODipine (NORVASC) 5 MG tablet TAKE 1 TABLET BY MOUTH  DAILY   aspirin EC 325 MG tablet Take 1 tablet (325 mg total) by mouth once as needed (when stroke-like symptoms present).   atorvastatin (LIPITOR) 80 MG tablet TAKE 1 TABLET BY MOUTH  DAILY   atorvastatin (LIPITOR) 80 MG tablet Take 1 tablet (80 mg total) by mouth daily.   benzonatate (TESSALON) 100 MG capsule Take 100 mg by mouth 2 (two) times daily.   Blood Glucose Monitoring Suppl (ACCU-CHEK GUIDE) w/Device KIT 1 Device by Does not apply route daily. Once daily E11.9   budesonide (PULMICORT) 1 MG/2ML nebulizer solution Take 1 mg by nebulization 2 (two) times daily.   budesonide-formoterol (SYMBICORT) 160-4.5 MCG/ACT inhaler Inhale 2 puffs into the lungs 2 (two) times daily.   cetirizine (ZYRTEC) 10 MG tablet Take  1 tablet (10 mg total) by mouth daily.   clopidogrel (PLAVIX) 75 MG tablet TAKE 1 TABLET BY MOUTH  DAILY   clopidogrel (PLAVIX) 75 MG tablet Take 1 tablet (75 mg total) by mouth daily.   famotidine (PEPCID) 40 MG tablet Take 40 mg by mouth daily.   fenofibrate 160 MG tablet TAKE 1 TABLET BY MOUTH  DAILY   fluticasone (FLONASE) 50 MCG/ACT nasal spray Place 1 spray into both nostrils as needed for allergies or rhinitis.   fluticasone furoate-vilanterol (BREO ELLIPTA) 100-25 MCG/ACT AEPB Inhale 1 puff into the lungs daily.   gabapentin (NEURONTIN) 400 MG capsule TAKE 1 CAPSULE BY MOUTH 3  TIMES DAILY   glucose blood test strip 1 each by Other route 2 (two) times daily. Use to check blood sugars twice a day Dx E11.9   Ipratropium-Albuterol (COMBIVENT RESPIMAT IN) Inhale 1-2 puffs into the lungs daily.   labetalol (NORMODYNE) 300 MG tablet TAKE 1 TABLET BY MOUTH  TWICE DAILY   levothyroxine (SYNTHROID) 75 MCG tablet Take  75 mcg by mouth daily.   metFORMIN (GLUCOPHAGE-XR) 500 MG 24 hr tablet TAKE 4 TABLETS BY MOUTH  DAILY WITH BREAKFAST   methylPREDNISolone (MEDROL DOSEPAK) 4 MG TBPK tablet Take 4-24 mg by mouth daily. Take 24 mg (6 tablets) by mouth on day one, and decrease by 4 mg once a day until finished   Methylsulfonylmethane (MSM) 1500 MG TABS Take 1,500 mg by mouth 2 (two) times daily.   montelukast (SINGULAIR) 10 MG tablet Take 10 mg by mouth daily.   MYRBETRIQ 25 MG TB24 tablet Take 25 mg by mouth daily.   pantoprazole (PROTONIX) 20 MG tablet Take 20 mg by mouth daily.     Allergies:   Latex, Zolpidem tartrate, Codeine, Hct [hydrochlorothiazide], Metformin and related, Naproxen, Prednisone, Zolpidem tartrate, Adhesive [tape], and Penicillins   Social History   Socioeconomic History   Marital status: Married    Spouse name: [name]   Number of children: 2   Years of education: HS   Highest education level: Not on file  Occupational History   Occupation: Retired  Tobacco Use    Smoking status: Former    Packs/day: 3.00    Years: 30.00    Total pack years: 90.00    Types: Cigarettes    Quit date: 11/26/1982    Years since quitting: 39.5   Smokeless tobacco: Never  Substance and Sexual Activity   Alcohol use: No   Drug use: No   Sexual activity: Not on file  Other Topics Concern   Not on file  Social History Narrative   Patient lives at home with spouse.   Caffiene Use: 4 cups daily   Social Determinants of Health   Financial Resource Strain: Not on file  Food Insecurity: Not on file  Transportation Needs: Not on file  Physical Activity: Not on file  Stress: Not on file  Social Connections: Not on file     Family History: The patient's family history includes Emphysema in his father; Heart attack in his father and mother; Heart disease in an other family member. There is no history of Diabetes, Thyroid disease, or Hypercalcemia.  ROS:   Please see the history of present illness.    All other systems reviewed and are negative.  EKGs/Labs/Other Studies Reviewed:    The following studies were reviewed today: EKG reveals sinus rhythm and nonspecific ST-T changes   Recent Labs: No results found for requested labs within last 365 days.  Recent Lipid Panel    Component Value Date/Time   CHOL 113 02/22/2020 1301   TRIG 257 (H) 12/22/2020 1039   HDL 36.00 (L) 02/22/2020 1301   CHOLHDL 3 02/22/2020 1301   VLDL 34.6 02/22/2020 1301   LDLCALC 42 02/22/2020 1301   LDLDIRECT 84.0 08/25/2018 0848    Physical Exam:    VS:  BP (!) 168/80   Pulse 73   Ht 5' 11.6" (1.819 m)   Wt 187 lb 9.6 oz (85.1 kg)   SpO2 98%   BMI 25.73 kg/m     Wt Readings from Last 3 Encounters:  06/11/22 187 lb 9.6 oz (85.1 kg)  02/29/20 189 lb 3.2 oz (85.8 kg)  09/03/19 184 lb 12.8 oz (83.8 kg)     GEN: Patient is in no acute distress HEENT: Normal NECK: No JVD; No carotid bruits LYMPHATICS: No lymphadenopathy CARDIAC: S1 S2 regular, 2/6 systolic murmur at the  apex. RESPIRATORY:  Clear to auscultation without rales, wheezing or rhonchi  ABDOMEN: Soft, non-tender, non-distended MUSCULOSKELETAL:  No edema; No deformity  SKIN: Warm and dry NEUROLOGIC:  Alert and oriented x 3 PSYCHIATRIC:  Normal affect    Signed, Jenean Lindau, MD  06/11/2022 10:59 AM    [facility]

## 2022-06-12 ENCOUNTER — Telehealth (HOSPITAL_COMMUNITY): Payer: Self-pay | Admitting: *Deleted

## 2022-06-12 NOTE — Telephone Encounter (Signed)
Left message on voicemail per DPR in reference to upcoming appointment scheduled on 06/13/22 with detailed instructions given per Myocardial Perfusion Study Information Sheet for the test. LM to arrive 15 minutes early, and that it is imperative to arrive on time for appointment to keep from having the test rescheduled. If you need to cancel or reschedule your appointment, please call the office within 24 hours of your appointment. Failure to do so may result in a cancellation of your appointment, and a $50 no show fee. Phone number given for call back for any questions. Kirstie Peri

## 2022-06-13 ENCOUNTER — Ambulatory Visit (INDEPENDENT_AMBULATORY_CARE_PROVIDER_SITE_OTHER): Payer: Medicare Other

## 2022-06-13 DIAGNOSIS — R0609 Other forms of dyspnea: Secondary | ICD-10-CM | POA: Diagnosis not present

## 2022-06-13 LAB — MYOCARDIAL PERFUSION IMAGING
LV dias vol: 189 mL (ref 62–150)
LV sys vol: 108 mL
Nuc Stress EF: 43 %
Peak HR: 71 {beats}/min
Rest HR: 56 {beats}/min
Rest Nuclear Isotope Dose: 10.9 mCi
SDS: 2
SRS: 7
SSS: 9
Stress Nuclear Isotope Dose: 31.2 mCi
TID: 1.02

## 2022-06-13 MED ORDER — REGADENOSON 0.4 MG/5ML IV SOLN
0.4000 mg | Freq: Once | INTRAVENOUS | Status: AC
Start: 2022-06-13 — End: 2022-06-13
  Administered 2022-06-13: 0.4 mg via INTRAVENOUS

## 2022-06-13 MED ORDER — TECHNETIUM TC 99M TETROFOSMIN IV KIT
31.2000 | PACK | Freq: Once | INTRAVENOUS | Status: AC | PRN
  Administered 2022-06-13: 31.2 via INTRAVENOUS

## 2022-06-13 MED ORDER — TECHNETIUM TC 99M TETROFOSMIN IV KIT
10.9000 | PACK | Freq: Once | INTRAVENOUS | Status: AC | PRN
  Administered 2022-06-13: 10.9 via INTRAVENOUS

## 2022-06-21 DIAGNOSIS — R0600 Dyspnea, unspecified: Secondary | ICD-10-CM | POA: Diagnosis not present

## 2022-06-21 DIAGNOSIS — J301 Allergic rhinitis due to pollen: Secondary | ICD-10-CM | POA: Diagnosis not present

## 2022-06-21 DIAGNOSIS — R5383 Other fatigue: Secondary | ICD-10-CM | POA: Diagnosis not present

## 2022-06-21 DIAGNOSIS — Z8616 Personal history of COVID-19: Secondary | ICD-10-CM | POA: Diagnosis not present

## 2022-06-21 DIAGNOSIS — I509 Heart failure, unspecified: Secondary | ICD-10-CM | POA: Diagnosis not present

## 2022-06-21 DIAGNOSIS — J4540 Moderate persistent asthma, uncomplicated: Secondary | ICD-10-CM | POA: Diagnosis not present

## 2022-06-21 DIAGNOSIS — G2581 Restless legs syndrome: Secondary | ICD-10-CM | POA: Diagnosis not present

## 2022-06-21 DIAGNOSIS — R911 Solitary pulmonary nodule: Secondary | ICD-10-CM | POA: Diagnosis not present

## 2022-06-25 ENCOUNTER — Ambulatory Visit: Payer: Medicare Other | Admitting: Cardiology

## 2022-06-25 ENCOUNTER — Encounter: Payer: Self-pay | Admitting: Cardiology

## 2022-06-25 VITALS — BP 140/68 | HR 82 | Ht 72.0 in | Wt 187.2 lb

## 2022-06-25 DIAGNOSIS — E088 Diabetes mellitus due to underlying condition with unspecified complications: Secondary | ICD-10-CM | POA: Diagnosis not present

## 2022-06-25 DIAGNOSIS — I10 Essential (primary) hypertension: Secondary | ICD-10-CM

## 2022-06-25 DIAGNOSIS — I429 Cardiomyopathy, unspecified: Secondary | ICD-10-CM

## 2022-06-25 DIAGNOSIS — E782 Mixed hyperlipidemia: Secondary | ICD-10-CM | POA: Diagnosis not present

## 2022-06-25 DIAGNOSIS — R531 Weakness: Secondary | ICD-10-CM

## 2022-06-25 NOTE — Patient Instructions (Signed)
Medication Instructions:  Your physician recommends that you continue on your current medications as directed. Please refer to the Current Medication list given to you today.   *If you need a refill on your cardiac medications before your next appointment, please call your pharmacy*   Lab Work: Your physician recommends that you have a BMET today in the office.   If you have labs (blood work) drawn today and your tests are completely normal, you will receive your results only by: [place] (if you have MyChart) OR A paper copy in the mail If you have any lab test that is abnormal or we need to change your treatment, we will call you to review the results.   Testing/Procedures:   Your cardiac CT will be scheduled at one of the below locations:   [facility] [address] [place], [place] [address] [phone]   At [facility], please arrive at the [facility] and Children's Entrance (Entrance C2) of [facility] 30 minutes prior to test start time. You can use the FREE valet parking offered at entrance C (encouraged to control the heart rate for the test)  Proceed to the [facility] Radiology Department (first floor) to check-in and test prep.  All radiology patients and guests should use entrance C2 at [facility], accessed from [facility], even though the hospital's physical address listed is [address].      Please follow these instructions carefully (unless otherwise directed):  Hold all erectile dysfunction medications at least 3 days (72 hrs) prior to test.  On the Night Before the Test: Be sure to Drink plenty of water. Do not consume any caffeinated/decaffeinated beverages or chocolate 12 hours prior to your test. Do not take any antihistamines 12 hours prior to your test.  On the Day of the Test: Drink plenty of water until 1 hour prior to the test. Do not eat any food 4 hours prior to the  test. You may take your regular medications prior to the test.  Take labetalol two hours prior to test.     After the Test: Drink plenty of water. After receiving IV contrast, you may experience a mild flushed feeling. This is normal. On occasion, you may experience a mild rash up to 24 hours after the test. This is not dangerous. If this occurs, you can take Benadryl 25 mg and increase your fluid intake. If you experience trouble breathing, this can be serious. If it is severe call 911 IMMEDIATELY. If it is mild, please call our office. If you take any of these medications: Glipizide/Metformin, Avandament, Glucavance, please do not take 48 hours after completing test unless otherwise instructed.  We will call to schedule your test 2-4 weeks out understanding that some insurance companies will need an authorization prior to the service being performed.   For non-scheduling related questions, please contact the cardiac imaging nurse navigator should you have any questions/concerns: Marchia Bond, Cardiac Imaging Nurse Navigator Gordy Clement, Cardiac Imaging Nurse Navigator [facility] Direct Office Dial: [phone]   For scheduling needs, including cancellations and rescheduling, please call [place], [phone].    Follow-Up: At [facility], you and your health needs are our priority.  As part of our continuing mission to provide you with exceptional heart care, we have created designated Provider Care Teams.  These Care Teams include your primary Cardiologist (physician) and Advanced Practice Providers (APPs -  Physician Assistants and Nurse Practitioners)  who all work together to provide you with the care you need, when you need it.  We recommend signing up for the patient portal called "MyChart".  Sign up information is provided on this After Visit Summary.  MyChart is used to connect with patients for Virtual Visits (Telemedicine).  Patients are able to  view lab/test results, encounter notes, upcoming appointments, etc.  Non-urgent messages can be sent to your provider as well.   To learn more about what you can do with MyChart, go to [email or web address].    Your next appointment:   1 month(s)  The format for your next appointment:   In Person  Provider:   Jyl Heinz, MD   Other Instructions Cardiac CT Angiogram A cardiac CT angiogram is a procedure to look at the heart and the area around the heart. It may be done to help find the cause of chest pains or other symptoms of heart disease. During this procedure, a substance called contrast dye is injected into the blood vessels in the area to be checked. A large X-ray machine, called a CT scanner, then takes detailed pictures of the heart and the surrounding area. The procedure is also sometimes called a coronary CT angiogram, coronary artery scanning, or CTA. A cardiac CT angiogram allows the health care provider to see how well blood is flowing to and from the heart. The health care provider will be able to see if there are any problems, such as: Blockage or narrowing of the coronary arteries in the heart. Fluid around the heart. Signs of weakness or disease in the muscles, valves, and tissues of the heart. Tell a health care provider about: Any allergies you have. This is especially important if you have had a previous allergic reaction to contrast dye. All medicines you are taking, including vitamins, herbs, eye drops, creams, and over-the-counter medicines. Any blood disorders you have. Any surgeries you have had. Any medical conditions you have. Whether you are pregnant or may be pregnant. Any anxiety disorders, chronic pain, or other conditions you have that may increase your stress or prevent you from lying still. What are the risks? Generally, this is a safe procedure. However, problems may occur, including: Bleeding. Infection. Allergic reactions to medicines or  dyes. Damage to other structures or organs. Kidney damage from the contrast dye that is used. Increased risk of cancer from radiation exposure. This risk is low. Talk with your health care provider about: The risks and benefits of testing. How you can receive the lowest dose of radiation. What happens before the procedure? Wear comfortable clothing and remove any jewelry, glasses, dentures, and hearing aids. Follow instructions from your health care provider about eating and drinking. This may include: For 12 hours before the procedure -- avoid caffeine. This includes tea, coffee, soda, energy drinks, and diet pills. Drink plenty of water or other fluids that do not have caffeine in them. Being well hydrated can prevent complications. For 4-6 hours before the procedure -- stop eating and drinking. The contrast dye can cause nausea, but this is less likely if your stomach is empty. Ask your health care provider about changing or stopping your regular medicines. This is especially important if you are taking diabetes medicines, blood thinners, or medicines to treat problems with erections (erectile dysfunction). What happens during the procedure?  Hair on your chest may need to be removed so that small sticky patches called electrodes can be placed on your chest. These will transmit information that helps to  monitor your heart during the procedure. An IV will be inserted into one of your veins. You might be given a medicine to control your heart rate during the procedure. This will help to ensure that good images are obtained. You will be asked to lie on an exam table. This table will slide in and out of the CT machine during the procedure. Contrast dye will be injected into the IV. You might feel warm, or you may get a metallic taste in your mouth. You will be given a medicine called nitroglycerin. This will relax or dilate the arteries in your heart. The table that you are lying on will move into  the CT machine tunnel for the scan. The person running the machine will give you instructions while the scans are being done. You may be asked to: Keep your arms above your head. Hold your breath. Stay very still, even if the table is moving. When the scanning is complete, you will be moved out of the machine. The IV will be removed. The procedure may vary among health care providers and hospitals. What can I expect after the procedure? After your procedure, it is common to have: A metallic taste in your mouth from the contrast dye. A feeling of warmth. A headache from the nitroglycerin. Follow these instructions at home: Take over-the-counter and prescription medicines only as told by your health care provider. If you are told, drink enough fluid to keep your urine pale yellow. This will help to flush the contrast dye out of your body. Most people can return to their normal activities right after the procedure. Ask your health care provider what activities are safe for you. It is up to you to get the results of your procedure. Ask your health care provider, or the department that is doing the procedure, when your results will be ready. Keep all follow-up visits as told by your health care provider. This is important. Contact a health care provider if: You have any symptoms of allergy to the contrast dye. These include: Shortness of breath. Rash or hives. A racing heartbeat. Summary A cardiac CT angiogram is a procedure to look at the heart and the area around the heart. It may be done to help find the cause of chest pains or other symptoms of heart disease. During this procedure, a large X-ray machine, called a CT scanner, takes detailed pictures of the heart and the surrounding area after a contrast dye has been injected into blood vessels in the area. Ask your health care provider about changing or stopping your regular medicines before the procedure. This is especially important if you  are taking diabetes medicines, blood thinners, or medicines to treat erectile dysfunction. If you are told, drink enough fluid to keep your urine pale yellow. This will help to flush the contrast dye out of your body. This information is not intended to replace advice given to you by your health care provider. Make sure you discuss any questions you have with your health care provider. Document Revised: 07/08/2019 Document Reviewed: 07/08/2019 Elsevier Patient Education  [place].

## 2022-06-25 NOTE — Addendum Note (Signed)
Addended by: Truddie Hidden on: 06/25/2022 02:01 PM   Modules accepted: Orders

## 2022-06-25 NOTE — Progress Notes (Signed)
Cardiology Office Note:    Date:  06/25/2022   ID:  [name], DOB [birth year], MRN [id number]  PCP:  Janine Limbo, PA-C  Cardiologist:  Jenean Lindau, MD   Referring MD: Janine Limbo, PA-C    ASSESSMENT:    1. Essential hypertension   2. Mixed hyperlipidemia   3. Cardiomyopathy, unspecified type ([place])   4. Diabetes mellitus due to underlying condition with unspecified complications ([place])   5. Left-sided weakness    PLAN:    In order of problems listed above:  Primary prevention stressed with the patient.  Importance of compliance with diet medication stressed and vocalized understanding. Cardiomyopathy and dyspnea on exertion: This is a significant finding in view of risk factors I did discuss coronary artery evaluation.  Invasive and noninvasive modalities were suggested he preferred a noninvasive route and we will set him up for CT coronary angiography. Essential hypertension: Blood pressure stable and diet was emphasized. Mixed dyslipidemia, diabetes mellitus: Lifestyle modification urged he is comfortable better.  His hemoglobin A1c was elevated.  Risks explained. Further recommendations will be made based on the findings of the aforementioned test.  Patient and family including wife and daughter had multiple questions which were answered to her satisfaction.   Medication Adjustments/Labs and Tests Ordered: Current medicines are reviewed at length with the patient today.  Concerns regarding medicines are outlined above.  No orders of the defined types were placed in this encounter.  No orders of the defined types were placed in this encounter.    No chief complaint on file.    History of Present Illness:    [name] is a 76 y.o. male.  Patient has past medical history of essential hypertension, dyslipidemia and diabetes mellitus.  He gives history of shortness of breath on exertion.  He is an ex-smoker.  He has moderate depression and biventricular  systolic function.  His symptoms are concerning.  His stress test did not reveal any evidence of ischemia but consistently correlated with the fact that his ejection fraction is moderately depressed.  At the time of my evaluation, the patient is alert awake oriented and in no distress.  Past Medical History:  Diagnosis Date   Abnormality of gait 03/31/2013   ALLERGIC RHINITIS 11/06/2007   Anemia, unspecified 08/22/2012   BENIGN PROSTATIC HYPERTROPHY 07/14/2007   Cardiomyopathy ([place]) 06/11/2022   Cervical spondylosis with radiculopathy 06/11/2022   Chronic osteomyelitis of toe of left foot ([place]) 09/12/2017   COLONIC POLYPS, HX OF 49/70/2637   Complication of anesthesia    pt states he needs to be cathed after surgery   COVID-19    Degenerative arthritis of right knee 12/12/2011   Diabetes ([place]) 02/03/2013   Diet controlled   Diabetes mellitus due to underlying condition with unspecified complications ([place]) 8/58/8502   Disequilibrium 03/31/2013   Dyspnea 08/31/2019   Encounter for well adult exam with abnormal findings 12/10/2011   Essential hypertension 07/11/2007   Qualifier: Diagnosis of  By: Elveria Royals    Former smoker 02/08/2015   GERD 07/11/2007   Hypercalcemia 08/12/2015   Hyperlipidemia    Hyperthyroidism    Hypervitaminosis D 01/14/2019   Hypothyroidism    Hypoxia    Lacunar infarction ([place]) 03/31/2013   Microalbuminuria 08/31/2019   Neck pain 03/26/2022   Neural foraminal stenosis of cervical spine 06/11/2022   OSTEOARTHRITIS, KNEE, RIGHT 10/27/2008   PERIPHERAL EDEMA 05/31/2009   Polyarthralgia 09/12/2017   PULMONARY NODULE 06/24/2008   granuloma   Recurrent  falls 08/17/2016   Right shoulder pain 08/17/2016   Rotator cuff arthropathy 09/07/2016   Injected 09/07/2016 Repeat injection 01/07/17 Repeat injection April 20th 2018. Repeat injection 05/22/2017 Repeat injection 08/15/2017   Skin lesion 02/19/2017   Spinal stenosis in cervical region 03/31/2013   Spondylosis,  cervical, with myelopathy 11/11/2012   TIA (transient ischemic attack) 08/13/2015   Upper back pain on right side 08/31/2019   Vitamin D deficiency 08/12/2015   Weight loss 08/31/2019    Past Surgical History:  Procedure Laterality Date   ANTERIOR CERVICAL DECOMP/DISCECTOMY FUSION N/A 01/06/2013   Procedure: ANTERIOR CERVICAL DECOMPRESSION/DISCECTOMY FUSION 1 LEVEL;  Surgeon: Erline Levine, MD;  Location: [facility];  Service: Neurosurgery;  Laterality: N/A;  Cervical three-four Anterior cervical decompression/diskectomy/fusion   BACK SURGERY  1986   x 2  1986 C 3   HERNIA REPAIR  sept 1986   left knee replacement  Nov 21, 2007   NECK SURGERY     NISSEN FUNDOPLICATION  9371   right heel bone spur  1987   TOTAL KNEE ARTHROPLASTY  01/09/2012   Procedure: TOTAL KNEE ARTHROPLASTY;  Surgeon: Tobi Bastos, MD;  Location: [facility];  Service: Orthopedics;  Laterality: Right;    Current Medications: Current Meds  Medication Sig   ACCU-CHEK GUIDE test strip USE AS INSTRUCTED ONCE  DAILY   Accu-Chek Softclix Lancets lancets USE AS DIRECTED ONCE DAILY   albuterol (PROVENTIL) (2.5 MG/3ML) 0.083% nebulizer solution Take 2.5 mg by nebulization every 4 (four) hours.   albuterol (VENTOLIN HFA) 108 (90 Base) MCG/ACT inhaler Inhale 1-2 puffs into the lungs every 6 (six) hours as needed for wheezing or shortness of breath.   amLODipine (NORVASC) 5 MG tablet TAKE 1 TABLET BY MOUTH  DAILY   aspirin EC 325 MG tablet Take 1 tablet (325 mg total) by mouth once as needed (when stroke-like symptoms present).   atorvastatin (LIPITOR) 80 MG tablet TAKE 1 TABLET BY MOUTH  DAILY   atorvastatin (LIPITOR) 80 MG tablet Take 1 tablet (80 mg total) by mouth daily.   benzonatate (TESSALON) 100 MG capsule Take 100 mg by mouth 2 (two) times daily.   Blood Glucose Monitoring Suppl (ACCU-CHEK GUIDE) w/Device KIT 1 Device by Does not apply route daily. Once daily E11.9   budesonide (PULMICORT) 1 MG/2ML nebulizer solution  Take 1 mg by nebulization 2 (two) times daily.   budesonide-formoterol (SYMBICORT) 160-4.5 MCG/ACT inhaler Inhale 2 puffs into the lungs 2 (two) times daily.   clopidogrel (PLAVIX) 75 MG tablet TAKE 1 TABLET BY MOUTH  DAILY   clopidogrel (PLAVIX) 75 MG tablet Take 1 tablet (75 mg total) by mouth daily.   famotidine (PEPCID) 40 MG tablet Take 40 mg by mouth daily.   fenofibrate 160 MG tablet TAKE 1 TABLET BY MOUTH  DAILY   fluticasone (FLONASE) 50 MCG/ACT nasal spray Place 1 spray into both nostrils as needed for allergies or rhinitis.   fluticasone furoate-vilanterol (BREO ELLIPTA) 100-25 MCG/ACT AEPB Inhale 1 puff into the lungs daily.   gabapentin (NEURONTIN) 400 MG capsule TAKE 1 CAPSULE BY MOUTH 3  TIMES DAILY   glucose blood test strip 1 each by Other route 2 (two) times daily. Use to check blood sugars twice a day Dx E11.9   Ipratropium-Albuterol (COMBIVENT RESPIMAT IN) Inhale 1-2 puffs into the lungs daily.   labetalol (NORMODYNE) 300 MG tablet TAKE 1 TABLET BY MOUTH  TWICE DAILY   levothyroxine (SYNTHROID) 75 MCG tablet Take 75 mcg by mouth daily.  LORazepam (ATIVAN) 1 MG tablet Take 0.5 tablets by mouth at bedtime.   metFORMIN (GLUCOPHAGE-XR) 500 MG 24 hr tablet TAKE 4 TABLETS BY MOUTH  DAILY WITH BREAKFAST   methylPREDNISolone (MEDROL DOSEPAK) 4 MG TBPK tablet Take 4-24 mg by mouth daily. Take 24 mg (6 tablets) by mouth on day one, and decrease by 4 mg once a day until finished   Methylsulfonylmethane (MSM) 1500 MG TABS Take 1,500 mg by mouth 2 (two) times daily.   montelukast (SINGULAIR) 10 MG tablet Take 10 mg by mouth daily.     Allergies:   Latex, Zolpidem tartrate, Codeine, Hct [hydrochlorothiazide], Metformin and related, Naproxen, Prednisone, Zolpidem tartrate, Adhesive [tape], and Penicillins   Social History   Socioeconomic History   Marital status: Married    Spouse name: [name]   Number of children: 2   Years of education: HS   Highest education level: Not on file   Occupational History   Occupation: Retired  Tobacco Use   Smoking status: Former    Packs/day: 3.00    Years: 30.00    Total pack years: 90.00    Types: Cigarettes    Quit date: 11/26/1982    Years since quitting: 39.6   Smokeless tobacco: Never  Substance and Sexual Activity   Alcohol use: No   Drug use: No   Sexual activity: Not on file  Other Topics Concern   Not on file  Social History Narrative   Patient lives at home with spouse.   Caffiene Use: 4 cups daily   Social Determinants of Health   Financial Resource Strain: Not on file  Food Insecurity: Not on file  Transportation Needs: Not on file  Physical Activity: Not on file  Stress: Not on file  Social Connections: Not on file     Family History: The patient's family history includes Emphysema in his father; Heart attack in his father and mother; Heart disease in an other family member. There is no history of Diabetes, Thyroid disease, or Hypercalcemia.  ROS:   Please see the history of present illness.    All other systems reviewed and are negative.  EKGs/Labs/Other Studies Reviewed:    The following studies were reviewed today: I discussed the findings of the stress test with the patient at length   Recent Labs: No results found for requested labs within last 365 days.  Recent Lipid Panel    Component Value Date/Time   CHOL 113 02/22/2020 1301   TRIG 257 (H) 12/22/2020 1039   HDL 36.00 (L) 02/22/2020 1301   CHOLHDL 3 02/22/2020 1301   VLDL 34.6 02/22/2020 1301   LDLCALC 42 02/22/2020 1301   LDLDIRECT 84.0 08/25/2018 0848    Physical Exam:    VS:  BP 140/68   Pulse 82   Ht 6' (1.829 m)   Wt 187 lb 3.2 oz (84.9 kg)   SpO2 91%   BMI 25.39 kg/m     Wt Readings from Last 3 Encounters:  06/25/22 187 lb 3.2 oz (84.9 kg)  06/11/22 187 lb 9.6 oz (85.1 kg)  02/29/20 189 lb 3.2 oz (85.8 kg)     GEN: Patient is in no acute distress HEENT: Normal NECK: No JVD; No carotid bruits LYMPHATICS: No  lymphadenopathy CARDIAC: Hear sounds regular, 2/6 systolic murmur at the apex. RESPIRATORY:  Clear to auscultation without rales, wheezing or rhonchi  ABDOMEN: Soft, non-tender, non-distended MUSCULOSKELETAL:  No edema; No deformity  SKIN: Warm and dry NEUROLOGIC:  Alert and oriented x 3  PSYCHIATRIC:  Normal affect   Signed, Jenean Lindau, MD  06/25/2022 1:49 PM    [facility]

## 2022-06-26 LAB — BASIC METABOLIC PANEL
BUN/Creatinine Ratio: 15 (ref 10–24)
BUN: 16 mg/dL (ref 8–27)
CO2: 19 mmol/L — ABNORMAL LOW (ref 20–29)
Calcium: 9.9 mg/dL (ref 8.6–10.2)
Chloride: 107 mmol/L — ABNORMAL HIGH (ref 96–106)
Creatinine, Ser: 1.04 mg/dL (ref 0.76–1.27)
Glucose: 132 mg/dL — ABNORMAL HIGH (ref 70–99)
Potassium: 4.4 mmol/L (ref 3.5–5.2)
Sodium: 144 mmol/L (ref 134–144)
eGFR: 73 mL/min/{1.73_m2} (ref >59)

## 2022-06-29 DIAGNOSIS — G8929 Other chronic pain: Secondary | ICD-10-CM | POA: Insufficient documentation

## 2022-06-29 DIAGNOSIS — M19011 Primary osteoarthritis, right shoulder: Secondary | ICD-10-CM

## 2022-06-29 HISTORY — DX: Primary osteoarthritis, right shoulder: M19.011

## 2022-06-29 HISTORY — DX: Other chronic pain: G89.29

## 2022-07-10 ENCOUNTER — Encounter (HOSPITAL_COMMUNITY): Payer: Self-pay

## 2022-07-11 ENCOUNTER — Telehealth (HOSPITAL_COMMUNITY): Payer: Self-pay | Admitting: *Deleted

## 2022-07-11 ENCOUNTER — Other Ambulatory Visit (HOSPITAL_COMMUNITY): Payer: Self-pay | Admitting: *Deleted

## 2022-07-11 MED ORDER — METOPROLOL TARTRATE 100 MG PO TABS: ORAL_TABLET | ORAL | 0 refills | Status: DC

## 2022-07-11 NOTE — Telephone Encounter (Signed)
Attempted to call patient regarding upcoming cardiac CT appointment. °Left message on voicemail with name and callback number ° °Necole Minassian RN Navigator Cardiac Imaging °[facility] °[phone] Office °[phone] Cell ° °

## 2022-07-11 NOTE — Telephone Encounter (Signed)
Reaching out to [name] to offer assistance regarding upcoming cardiac imaging study; pt verbalizes understanding of appt date/time, parking situation and where to check in, pre-test NPO status and medications ordered, and verified current allergies; name and call back number provided for further questions should they arise  Steven Clement RN Navigator Cardiac Imaging [name] [phone] office [phone] cell  [name] to take daily medications along with '100mg'$  metoprolol tartrate two hours prior to his cardiac CT scan after discussing with Dr. Audie Box. He is aware to arrive at 12pm.

## 2022-07-12 ENCOUNTER — Other Ambulatory Visit: Payer: Self-pay | Admitting: Cardiology

## 2022-07-12 ENCOUNTER — Ambulatory Visit (HOSPITAL_COMMUNITY)
Admission: RE | Admit: 2022-07-12 | Discharge: 2022-07-12 | Disposition: A | Discharge status: Home / Self Care | Payer: Medicare Other | Source: Ambulatory Visit | Attending: Cardiology | Admitting: Cardiology

## 2022-07-12 DIAGNOSIS — I2510 Atherosclerotic heart disease of native coronary artery without angina pectoris: Secondary | ICD-10-CM | POA: Diagnosis not present

## 2022-07-12 DIAGNOSIS — R931 Abnormal findings on diagnostic imaging of heart and coronary circulation: Secondary | ICD-10-CM

## 2022-07-12 DIAGNOSIS — I429 Cardiomyopathy, unspecified: Secondary | ICD-10-CM | POA: Insufficient documentation

## 2022-07-12 MED ORDER — IOHEXOL 350 MG/ML SOLN
100.0000 mL | Freq: Once | INTRAVENOUS | Status: AC | PRN
  Administered 2022-07-12: 100 mL via INTRAVENOUS

## 2022-07-12 MED ORDER — NITROGLYCERIN 0.4 MG SL SUBL
SUBLINGUAL_TABLET | SUBLINGUAL | Status: AC
  Filled 2022-07-12: qty 2

## 2022-07-12 MED ORDER — NITROGLYCERIN 0.4 MG SL SUBL
0.8000 mg | SUBLINGUAL_TABLET | Freq: Once | SUBLINGUAL | Status: AC
  Administered 2022-07-12: 0.8 mg via SUBLINGUAL

## 2022-07-12 NOTE — Progress Notes (Signed)
FFR order 

## 2022-07-13 ENCOUNTER — Ambulatory Visit (HOSPITAL_BASED_OUTPATIENT_CLINIC_OR_DEPARTMENT_OTHER)
Admission: RE | Admit: 2022-07-13 | Discharge: 2022-07-13 | Disposition: A | Discharge status: Home / Self Care | Payer: Medicare Other | Source: Ambulatory Visit | Attending: Cardiology | Admitting: Cardiology

## 2022-07-13 ENCOUNTER — Ambulatory Visit (HOSPITAL_COMMUNITY)
Admission: RE | Admit: 2022-07-13 | Discharge: 2022-07-13 | Disposition: A | Discharge status: Home / Self Care | Payer: Medicare Other | Source: Ambulatory Visit | Attending: Cardiology | Admitting: Cardiology

## 2022-07-13 DIAGNOSIS — R931 Abnormal findings on diagnostic imaging of heart and coronary circulation: Secondary | ICD-10-CM

## 2022-07-19 ENCOUNTER — Telehealth: Payer: Self-pay | Admitting: Cardiology

## 2022-07-19 ENCOUNTER — Encounter: Payer: Self-pay | Admitting: Cardiology

## 2022-07-19 DIAGNOSIS — R931 Abnormal findings on diagnostic imaging of heart and coronary circulation: Secondary | ICD-10-CM

## 2022-07-19 MED ORDER — NITROGLYCERIN 0.4 MG SL SUBL: 0.4000 mg | SUBLINGUAL_TABLET | SUBLINGUAL | 6 refills | Status: DC | PRN

## 2022-07-19 NOTE — Telephone Encounter (Signed)
Called number but call gets disconnected. Had a MyChart message earlier.

## 2022-07-19 NOTE — Telephone Encounter (Signed)
Follow Up:       Patient's wife is returning Shonda's call from 07-16-22, concerning patient's results. She said to please call her after after 11:30 today.

## 2022-07-23 NOTE — Addendum Note (Signed)
Addended by: Truddie Hidden on: 07/23/2022 08:20 AM   Modules accepted: Orders

## 2022-07-24 ENCOUNTER — Encounter: Payer: Self-pay | Admitting: Cardiology

## 2022-07-24 LAB — BASIC METABOLIC PANEL
BUN/Creatinine Ratio: 13 (ref 10–24)
BUN: 13 mg/dL (ref 8–27)
CO2: 23 mmol/L (ref 20–29)
Calcium: 10 mg/dL (ref 8.6–10.2)
Chloride: 110 mmol/L — ABNORMAL HIGH (ref 96–106)
Creatinine, Ser: 1.03 mg/dL (ref 0.76–1.27)
Glucose: 126 mg/dL — ABNORMAL HIGH (ref 70–99)
Potassium: 4.1 mmol/L (ref 3.5–5.2)
Sodium: 146 mmol/L — ABNORMAL HIGH (ref 134–144)
eGFR: 74 mL/min/{1.73_m2} (ref >59)

## 2022-07-24 LAB — HEPATIC FUNCTION PANEL
ALT: 16 IU/L (ref 0–44)
AST: 18 IU/L (ref 0–40)
Albumin: 4.8 g/dL (ref 3.8–4.8)
Alkaline Phosphatase: 49 IU/L (ref 44–121)
Bilirubin Total: 0.4 mg/dL (ref 0.0–1.2)
Bilirubin, Direct: 0.14 mg/dL (ref 0.00–0.40)
Total Protein: 7.2 g/dL (ref 6.0–8.5)

## 2022-07-24 LAB — LIPID PANEL
Chol/HDL Ratio: 3.7 ratio (ref 0.0–5.0)
Cholesterol, Total: 147 mg/dL (ref 100–199)
HDL: 40 mg/dL (ref >39)
LDL Chol Calc (NIH): 86 mg/dL (ref 0–99)
Triglycerides: 113 mg/dL (ref 0–149)
VLDL Cholesterol Cal: 21 mg/dL (ref 5–40)

## 2022-07-31 ENCOUNTER — Other Ambulatory Visit: Payer: Self-pay

## 2022-08-01 ENCOUNTER — Encounter: Payer: Self-pay | Admitting: Cardiology

## 2022-08-01 ENCOUNTER — Ambulatory Visit: Payer: Medicare Other | Attending: Cardiology | Admitting: Cardiology

## 2022-08-01 ENCOUNTER — Ambulatory Visit (INDEPENDENT_AMBULATORY_CARE_PROVIDER_SITE_OTHER): Payer: Medicare Other

## 2022-08-01 VITALS — BP 134/72 | HR 60 | Ht 72.0 in | Wt 187.7 lb

## 2022-08-01 DIAGNOSIS — E782 Mixed hyperlipidemia: Secondary | ICD-10-CM

## 2022-08-01 DIAGNOSIS — G459 Transient cerebral ischemic attack, unspecified: Secondary | ICD-10-CM | POA: Diagnosis not present

## 2022-08-01 DIAGNOSIS — E088 Diabetes mellitus due to underlying condition with unspecified complications: Secondary | ICD-10-CM

## 2022-08-01 DIAGNOSIS — I2510 Atherosclerotic heart disease of native coronary artery without angina pectoris: Secondary | ICD-10-CM | POA: Insufficient documentation

## 2022-08-01 DIAGNOSIS — Z87891 Personal history of nicotine dependence: Secondary | ICD-10-CM

## 2022-08-01 DIAGNOSIS — I10 Essential (primary) hypertension: Secondary | ICD-10-CM | POA: Diagnosis not present

## 2022-08-01 DIAGNOSIS — R002 Palpitations: Secondary | ICD-10-CM | POA: Diagnosis not present

## 2022-08-01 HISTORY — DX: Atherosclerotic heart disease of native coronary artery without angina pectoris: I25.10

## 2022-08-01 MED ORDER — AMLODIPINE BESYLATE 2.5 MG PO TABS: 2.5000 mg | ORAL_TABLET | Freq: Every day | ORAL | 3 refills | Status: DC

## 2022-08-01 NOTE — Progress Notes (Signed)
Cardiology Office Note:    Date:  08/01/2022   ID:  [name], DOB 1946/05/22, MRN [id number]  PCP:  Janine Limbo, PA-C  Cardiologist:  Jenean Lindau, MD   Referring MD: Janine Limbo, PA-C    ASSESSMENT:    1. Essential hypertension   2. Mixed hyperlipidemia   3. TIA (transient ischemic attack)   4. Coronary artery disease involving native coronary artery of native heart without angina pectoris   5. Former smoker   6. Diabetes mellitus due to underlying condition with unspecified complications ([place])    PLAN:    In order of problems listed above:  Coronary artery disease: Secondary prevention stressed with the patient.  Importance of compliance with diet medication stressed any vocalized understanding.  He walks on a regular basis to the best of his ability and therefore we will continue current regimen. Essential hypertension: Blood pressure stable and diet was emphasized. Dizziness and palpitations: This is very occasional.  In view of this I will reduce his amlodipine to 2.5 mg daily.  I will see if the symptoms get better.  He is a diabetic and this may be to some extent postural hypotension.  He will get back to me in 2 weeks to tell me how he feels about this.  We will also do a 2-week monitor to see for any slow heart rate issues. Diabetes mellitus: Managed by primary care.  Diet emphasized. Mild aortic stenosis: Patient will have echocardiogram to assess this. Patient will be seen in follow-up appointment in 6 months or earlier if the patient has any concerns    Medication Adjustments/Labs and Tests Ordered: Current medicines are reviewed at length with the patient today.  Concerns regarding medicines are outlined above.  No orders of the defined types were placed in this encounter.  No orders of the defined types were placed in this encounter.    No chief complaint on file.    History of Present Illness:    [name] is a 76 y.o. male.  Patient has  past medical history of essential hypertension, dyslipidemia and diabetes mellitus.  He is CT coronary angiography revealed multiple areas of stenosis though nonobstructive.  He is now walking on a regular basis.  He denies any chest pain orthopnea or PND.  He complains of occasional dizziness and sometimes palpitations.  No chest pain orthopnea or PND.  At the time of my evaluation, the patient is alert awake oriented and in no distress.  Past Medical History:  Diagnosis Date   Abnormality of gait 03/31/2013   ALLERGIC RHINITIS 11/06/2007   Anemia, unspecified 08/22/2012   BENIGN PROSTATIC HYPERTROPHY 07/14/2007   Cardiomyopathy ([place]) 06/11/2022   Cervical spondylosis with radiculopathy 06/11/2022   Chronic osteomyelitis of toe of left foot ([place]) 09/12/2017   Chronic right shoulder pain 06/29/2022   COLONIC POLYPS, HX OF 11/18/8249   Complication of anesthesia    pt states he needs to be cathed after surgery   COVID-19    Degenerative arthritis of right knee 12/12/2011   Diabetes ([place]) 02/03/2013   Diet controlled   Diabetes mellitus due to underlying condition with unspecified complications ([place]) 0/37/0488   Disequilibrium 03/31/2013   Dyspnea 08/31/2019   Encounter for well adult exam with abnormal findings 12/10/2011   Essential hypertension 07/11/2007   Qualifier: Diagnosis of  By: Elveria Royals    Former smoker 02/08/2015   GERD 07/11/2007   Hypercalcemia 08/12/2015   Hyperlipidemia    Hyperthyroidism  Hypervitaminosis D 01/14/2019   Hypothyroidism    Hypoxia    Lacunar infarction ([place]) 03/31/2013   Microalbuminuria 08/31/2019   Neck pain 03/26/2022   Neural foraminal stenosis of cervical spine 06/11/2022   OSTEOARTHRITIS, KNEE, RIGHT 10/27/2008   PERIPHERAL EDEMA 05/31/2009   Polyarthralgia 09/12/2017   Primary osteoarthritis of right shoulder 06/29/2022   PULMONARY NODULE 06/24/2008   granuloma   Recurrent falls 08/17/2016   Right shoulder pain 08/17/2016   Rotator cuff  arthropathy 09/07/2016   Injected 09/07/2016 Repeat injection 01/07/17 Repeat injection April 20th 2018. Repeat injection 05/22/2017 Repeat injection 08/15/2017   Skin lesion 02/19/2017   Spinal stenosis in cervical region 03/31/2013   Spondylosis, cervical, with myelopathy 11/11/2012   TIA (transient ischemic attack) 08/13/2015   Upper back pain on right side 08/31/2019   Vitamin D deficiency 08/12/2015   Weight loss 08/31/2019    Past Surgical History:  Procedure Laterality Date   ANTERIOR CERVICAL DECOMP/DISCECTOMY FUSION N/A 01/06/2013   Procedure: ANTERIOR CERVICAL DECOMPRESSION/DISCECTOMY FUSION 1 LEVEL;  Surgeon: Erline Levine, MD;  Location: [facility];  Service: Neurosurgery;  Laterality: N/A;  Cervical three-four Anterior cervical decompression/diskectomy/fusion   BACK SURGERY  1986   x 2  1986 C 3   HERNIA REPAIR  sept 1986   left knee replacement  Nov 21, 2007   NECK SURGERY     NISSEN FUNDOPLICATION  9628   right heel bone spur  1987   TOTAL KNEE ARTHROPLASTY  01/09/2012   Procedure: TOTAL KNEE ARTHROPLASTY;  Surgeon: Tobi Bastos, MD;  Location: [facility];  Service: Orthopedics;  Laterality: Right;    Current Medications: Current Meds  Medication Sig   ACCU-CHEK GUIDE test strip USE AS INSTRUCTED ONCE  DAILY   Accu-Chek Softclix Lancets lancets USE AS DIRECTED ONCE DAILY   albuterol (PROVENTIL) (2.5 MG/3ML) 0.083% nebulizer solution Take 2.5 mg by nebulization every 4 (four) hours.   albuterol (VENTOLIN HFA) 108 (90 Base) MCG/ACT inhaler Inhale 1-2 puffs into the lungs every 6 (six) hours as needed for wheezing or shortness of breath.   amLODipine (NORVASC) 5 MG tablet TAKE 1 TABLET BY MOUTH  DAILY   aspirin EC 325 MG tablet Take 1 tablet (325 mg total) by mouth once as needed (when stroke-like symptoms present).   atorvastatin (LIPITOR) 80 MG tablet TAKE 1 TABLET BY MOUTH  DAILY   atorvastatin (LIPITOR) 80 MG tablet Take 1 tablet (80 mg total) by mouth daily.    benzonatate (TESSALON) 100 MG capsule Take 100 mg by mouth 2 (two) times daily.   Blood Glucose Monitoring Suppl (ACCU-CHEK GUIDE) w/Device KIT 1 Device by Does not apply route daily. Once daily E11.9   budesonide (PULMICORT) 1 MG/2ML nebulizer solution Take 1 mg by nebulization 2 (two) times daily.   budesonide-formoterol (SYMBICORT) 160-4.5 MCG/ACT inhaler Inhale 2 puffs into the lungs 2 (two) times daily.   cetirizine (ZYRTEC) 10 MG tablet Take 1 tablet (10 mg total) by mouth daily.   clopidogrel (PLAVIX) 75 MG tablet TAKE 1 TABLET BY MOUTH  DAILY   clopidogrel (PLAVIX) 75 MG tablet Take 1 tablet (75 mg total) by mouth daily.   famotidine (PEPCID) 40 MG tablet Take 40 mg by mouth daily.   fenofibrate 160 MG tablet TAKE 1 TABLET BY MOUTH  DAILY   fluticasone (FLONASE) 50 MCG/ACT nasal spray Place 1 spray into both nostrils as needed for allergies or rhinitis.   fluticasone furoate-vilanterol (BREO ELLIPTA) 100-25 MCG/ACT AEPB Inhale 1 puff into the  lungs daily.   gabapentin (NEURONTIN) 400 MG capsule TAKE 1 CAPSULE BY MOUTH 3  TIMES DAILY   glucose blood test strip 1 each by Other route 2 (two) times daily. Use to check blood sugars twice a day Dx E11.9   Ipratropium-Albuterol (COMBIVENT RESPIMAT IN) Inhale 1-2 puffs into the lungs daily.   labetalol (NORMODYNE) 300 MG tablet TAKE 1 TABLET BY MOUTH  TWICE DAILY   levothyroxine (SYNTHROID) 75 MCG tablet Take 75 mcg by mouth daily.   LORazepam (ATIVAN) 1 MG tablet Take 0.5 tablets by mouth at bedtime.   metFORMIN (GLUCOPHAGE-XR) 500 MG 24 hr tablet TAKE 4 TABLETS BY MOUTH  DAILY WITH BREAKFAST   methylPREDNISolone (MEDROL DOSEPAK) 4 MG TBPK tablet Take 4-24 mg by mouth daily. Take 24 mg (6 tablets) by mouth on day one, and decrease by 4 mg once a day until finished   metoprolol tartrate (LOPRESSOR) 100 MG tablet Take tablet (155m) TWO hours prior to his cardiac CT scan.   montelukast (SINGULAIR) 10 MG tablet Take 10 mg by mouth daily.    MYRBETRIQ 25 MG TB24 tablet Take 25 mg by mouth daily.   nitroGLYCERIN (NITROSTAT) 0.4 MG SL tablet Place 0.4 mg under the tongue every 5 (five) minutes as needed for chest pain.     Allergies:   Latex, Zolpidem tartrate, Codeine, Hct [hydrochlorothiazide], Metformin and related, Naproxen, Prednisone, Zolpidem tartrate, Adhesive [tape], and Penicillins   Social History   Socioeconomic History   Marital status: Married    Spouse name: CHoyle Sauer  Number of children: 2   Years of education: HS   Highest education level: Not on file  Occupational History   Occupation: Retired  Tobacco Use   Smoking status: Former    Packs/day: 3.00    Years: 30.00    Total pack years: 90.00    Types: Cigarettes    Quit date: 11/26/1982    Years since quitting: 39.7   Smokeless tobacco: Never  Substance and Sexual Activity   Alcohol use: No   Drug use: No   Sexual activity: Not on file  Other Topics Concern   Not on file  Social History Narrative   Patient lives at home with spouse.   Caffiene Use: 4 cups daily   Social Determinants of Health   Financial Resource Strain: Not on file  Food Insecurity: Not on file  Transportation Needs: Not on file  Physical Activity: Not on file  Stress: Not on file  Social Connections: Not on file     Family History: The patient's family history includes Emphysema in his father; Heart attack in his father and mother; Heart disease in an other family member. There is no history of Diabetes, Thyroid disease, or Hypercalcemia.  ROS:   Please see the history of present illness.    All other systems reviewed and are negative.  EKGs/Labs/Other Studies Reviewed:    The following studies were reviewed today: EXAM: FFRCT ANALYSIS   FINDINGS: FFRct analysis was performed on the original cardiac CT angiogram dataset. Diagrammatic representation of the FFRct analysis is provided in a separate PDF document in PACS. This dictation was created using the PDF  document and an interactive 3D model of the results. 3D model is not available in the EMR/PACS. Normal FFR range is >0.80.   1. LM FFR: 0.99. 2. LAD FFR: Prox 0.97, mid 0.87, distal 0.8 3. CX FFR: Prox 0.98, mid 0.95, distal 0.86 4. RCA FFR: Prox 0.97, mid 0.91, distal 0.9  Mid LAD stenosis - pre stenosis FFR: 0.97, post stenosis FFR: 0.93 - Delta 0.04 - insignificant.   IMPRESSION: IMPRESSION This study demonstrate low likelihood for hemodynamically significant stenosis.     Electronically Signed   By: Jenne Campus M.D.   On: 07/12/2022 21:30   Recent Labs: 07/23/2022: ALT 16; BUN 13; Creatinine, Ser 1.03; Potassium 4.1; Sodium 146  Recent Lipid Panel    Component Value Date/Time   CHOL 147 07/23/2022 0825   TRIG 113 07/23/2022 0825   HDL 40 07/23/2022 0825   CHOLHDL 3.7 07/23/2022 0825   CHOLHDL 3 02/22/2020 1301   VLDL 34.6 02/22/2020 1301   LDLCALC 86 07/23/2022 0825   LDLDIRECT 84.0 08/25/2018 0848    Physical Exam:    VS:  BP 134/72   Pulse 60   Ht 6' (1.829 m)   Wt 187 lb 10.4 oz (85.1 kg)   SpO2 96%   BMI 25.45 kg/m     Wt Readings from Last 3 Encounters:  08/01/22 187 lb 10.4 oz (85.1 kg)  06/25/22 187 lb 3.2 oz (84.9 kg)  06/11/22 187 lb 9.6 oz (85.1 kg)     GEN: Patient is in no acute distress HEENT: Normal NECK: No JVD; No carotid bruits LYMPHATICS: No lymphadenopathy CARDIAC: Hear sounds regular, 2/6 systolic murmur at the apex. RESPIRATORY:  Clear to auscultation without rales, wheezing or rhonchi  ABDOMEN: Soft, non-tender, non-distended MUSCULOSKELETAL:  No edema; No deformity  SKIN: Warm and dry NEUROLOGIC:  Alert and oriented x 3 PSYCHIATRIC:  Normal affect   Signed, Jenean Lindau, MD  08/01/2022 2:10 PM     [facility]

## 2022-08-01 NOTE — Patient Instructions (Addendum)
Medication Instructions:  Your physician has recommended you make the following change in your medication:   Decrease your Amlodipine to 2.5 mg daily. Cut your current dose in 1/2 and the next RX will be for a 2.5 mg tablet.  *If you need a refill on your cardiac medications before your next appointment, please call your pharmacy*   Lab Work: None ordered If you have labs (blood work) drawn today and your tests are completely normal, you will receive your results only by: [place] (if you have MyChart) OR A paper copy in the mail If you have any lab test that is abnormal or we need to change your treatment, we will call you to review the results.   Testing/Procedures: Your physician has requested that you have an echocardiogram. Echocardiography is a painless test that uses sound waves to create images of your heart. It provides your doctor with information about the size and shape of your heart and how well your heart's chambers and valves are working. This procedure takes approximately one hour. There are no restrictions for this procedure.   WHY IS MY DOCTOR PRESCRIBING ZIO? The Zio system is proven and trusted by physicians to detect and diagnose irregular heart rhythms -- and has been prescribed to hundreds of thousands of patients.  The FDA has cleared the Zio system to monitor for many different kinds of irregular heart rhythms. In a study, physicians were able to reach a diagnosis 90% of the time with the Zio system1.  You can wear the Zio monitor -- a small, discreet, comfortable patch -- during your normal day-to-day activity, including while you sleep, shower, and exercise, while it records every single heartbeat for analysis.  1Barrett, P., et al. Comparison of 24 Hour Holter Monitoring Versus 14 Day Novel Adhesive Patch Electrocardiographic Monitoring. [place], 2014.  ZIO VS. HOLTER MONITORING The Zio monitor can be comfortably worn for up to 14  days. Holter monitors can be worn for 24 to 48 hours, limiting the time to record any irregular heart rhythms you may have. Zio is able to capture data for the 51% of patients who have their first symptom-triggered arrhythmia after 48 hours.1  LIVE WITHOUT RESTRICTIONS The Zio ambulatory cardiac monitor is a small, unobtrusive, and water-resistant patch--you might even forget you're wearing it. The Zio monitor records and stores every beat of your heart, whether you're sleeping, working out, or showering. Wear the monitor for 14 days, remove 08/15/22.   Follow-Up: At [facility], you and your health needs are our priority.  As part of our continuing mission to provide you with exceptional heart care, we have created designated Provider Care Teams.  These Care Teams include your primary Cardiologist (physician) and Advanced Practice Providers (APPs -  Physician Assistants and Nurse Practitioners) who all work together to provide you with the care you need, when you need it.  We recommend signing up for the patient portal called "MyChart".  Sign up information is provided on this After Visit Summary.  MyChart is used to connect with patients for Virtual Visits (Telemedicine).  Patients are able to view lab/test results, encounter notes, upcoming appointments, etc.  Non-urgent messages can be sent to your provider as well.   To learn more about what you can do with MyChart, go to [email or web address].    Your next appointment:   6 month(s)  The format for your next appointment:   In Person  Provider:   Jyl Heinz, MD   Other  Instructions NA

## 2022-08-15 ENCOUNTER — Ambulatory Visit: Payer: Medicare Other | Attending: Cardiology

## 2022-08-15 ENCOUNTER — Ambulatory Visit: Payer: Medicare Other | Admitting: Cardiology

## 2022-08-15 DIAGNOSIS — I2510 Atherosclerotic heart disease of native coronary artery without angina pectoris: Secondary | ICD-10-CM

## 2022-08-15 LAB — ECHOCARDIOGRAM COMPLETE
Area-P 1/2: 4.96 cm2
MV M vel: 4.77 m/s
MV Peak grad: 91 mmHg
Radius: 0.4 cm
S' Lateral: 5 cm

## 2022-08-21 ENCOUNTER — Telehealth: Payer: Self-pay

## 2022-08-21 DIAGNOSIS — I509 Heart failure, unspecified: Secondary | ICD-10-CM

## 2022-08-21 MED ORDER — ENTRESTO 24-26 MG PO TABS: 1.0000 | ORAL_TABLET | Freq: Two times a day (BID) | ORAL | 12 refills | Status: DC

## 2022-08-21 NOTE — Telephone Encounter (Signed)
-----   Message from Jenean Lindau, MD sent at 08/16/2022  8:29 AM EDT ----- Ejection fraction is mildly diminished.  Please check with patient if patient is willing to start Entresto.  In this case given the med list.  Med list shows that the patient is on 2 beta-blockers.  Let us confirm what he is taking.  Also I would like him to get a pulse blood pressure check and Chem-7 when he comes here before he starts the medicine.  Make sure he is not on ACE inhibitor's or ARB.  If he starts the medicine let me know because we will have to then get him back for follow-up in a week for a nurse visit for Chem-7 pulse blood pressure check and he needs to keep a log of his vital signs at home.  Copy primary Jenean Lindau, MD 08/16/2022 8:23 AM

## 2022-08-21 NOTE — Telephone Encounter (Signed)
-----   Message from Jenean Lindau, MD sent at 08/21/2022  1:45 PM EDT ----- Stop amlodipine.  Start Entresto low-dose.  Make sure patient is not on ACE inhibitor or ARB.  I did not find any on the med list.  Keep a track of pulse blood pressure at home and nurse visit for Chem-7 and pulse blood pressure check in 1 week. ----- Message ----- From: Truddie Hidden, RN Sent: 08/21/2022   1:12 PM EDT To: Jenean Lindau, MD  Medication list has been updated. Pt is willing to start Entresto. ----- Message ----- From: Jenean Lindau, MD Sent: 08/16/2022   8:29 AM EDT To: Truddie Hidden, RN  Ejection fraction is mildly diminished.  Please check with patient if patient is willing to start Entresto.  In this case given the med list.  Med list shows that the patient is on 2 beta-blockers.  Let us confirm what he is taking.  Also I would like him to get a pulse blood pressure check and Chem-7 when he comes here before he starts the medicine.  Make sure he is not on ACE inhibitor's or ARB.  If he starts the medicine let me know because we will have to then get him back for follow-up in a week for a nurse visit for Chem-7 pulse blood pressure check and he needs to keep a log of his vital signs at home.  Copy primary Jenean Lindau, MD 08/16/2022 8:23 AM

## 2022-08-29 ENCOUNTER — Ambulatory Visit: Payer: Medicare Other | Attending: Cardiology

## 2022-08-29 VITALS — BP 177/83 | HR 71

## 2022-08-29 DIAGNOSIS — I509 Heart failure, unspecified: Secondary | ICD-10-CM

## 2022-08-29 MED ORDER — ENTRESTO 49-51 MG PO TABS: 1.0000 | ORAL_TABLET | Freq: Two times a day (BID) | ORAL | 3 refills | Status: DC

## 2022-08-29 NOTE — Progress Notes (Signed)
   Nurse Visit   Date of Encounter: 08/29/2022 ID: Shanon Rosser, DOB 02-05-1946, MRN [id number]  PCP:  Janine Limbo, PA-C   [place] Providers Cardiologist:  None      Visit Details   VS:  BP (!) 177/83 (BP Location: Left Arm, Patient Position: Sitting, Cuff Size: Normal)   Pulse 71  , BMI There is no height or weight on file to calculate BMI.  Wt Readings from Last 3 Encounters:  08/01/22 187 lb 10.4 oz (85.1 kg)  06/25/22 187 lb 3.2 oz (84.9 kg)  06/11/22 187 lb 9.6 oz (85.1 kg)     Reason for visit: Check the patient's vitals, review blood pressure log and have BMP drawn Performed today: Vitals, Provider consulted, lab work and Education Changes (medications, testing, etc.) : Entresto increased to 49/51 dose, BMP was ordered, Nurse visit was scheduled in Epic for 1 week to review the patients blood pressure log Length of Visit: 25 minutes    Medications Adjustments/Labs and Tests Ordered: Orders Placed This Encounter  Procedures   Basic Metabolic Panel (BMET)   Meds ordered this encounter  Medications   sacubitril-valsartan (ENTRESTO) 49-51 MG    Sig: Take 1 tablet by mouth 2 (two) times daily.    Dispense:  180 tablet    Refill:  3     Signed, Louie Casa, RN  08/29/2022 11:35 AM

## 2022-08-30 LAB — BASIC METABOLIC PANEL
BUN/Creatinine Ratio: 15 (ref 10–24)
BUN: 16 mg/dL (ref 8–27)
CO2: 22 mmol/L (ref 20–29)
Calcium: 10.2 mg/dL (ref 8.6–10.2)
Chloride: 108 mmol/L — ABNORMAL HIGH (ref 96–106)
Creatinine, Ser: 1.09 mg/dL (ref 0.76–1.27)
Glucose: 140 mg/dL — ABNORMAL HIGH (ref 70–99)
Potassium: 4.1 mmol/L (ref 3.5–5.2)
Sodium: 144 mmol/L (ref 134–144)
eGFR: 69 mL/min/{1.73_m2} (ref >59)

## 2022-09-04 ENCOUNTER — Other Ambulatory Visit: Payer: Self-pay

## 2022-09-04 MED ORDER — ENTRESTO 49-51 MG PO TABS: 1.0000 | ORAL_TABLET | Freq: Two times a day (BID) | ORAL | 2 refills | Status: DC

## 2022-09-05 ENCOUNTER — Ambulatory Visit: Payer: Medicare Other | Attending: Cardiology

## 2022-09-05 VITALS — BP 142/70 | HR 52 | Ht 71.5 in | Wt 184.4 lb

## 2022-09-05 DIAGNOSIS — I429 Cardiomyopathy, unspecified: Secondary | ICD-10-CM

## 2022-09-05 MED ORDER — ENTRESTO 97-103 MG PO TABS
1.0000 | ORAL_TABLET | Freq: Two times a day (BID) | ORAL | 3 refills | Status: DC
Start: 2022-09-05 — End: 2022-10-31

## 2022-09-05 NOTE — Addendum Note (Signed)
Addended by: Truddie Hidden on: 09/05/2022 12:09 PM   Modules accepted: Orders

## 2022-09-05 NOTE — Progress Notes (Signed)
   Nurse Visit   Date of Encounter: 09/05/2022 ID: Shanon Rosser, DOB 1946-08-07, MRN [id number]  PCP:  Janine Limbo, PA-C   [place] Providers Cardiologist:  Revankar   Visit Details   VS:  BP (!) 142/70   Pulse (!) 52   Ht 5' 11.5" (1.816 m)   Wt 184 lb 6.4 oz (83.6 kg)   BMI 25.36 kg/m  , BMI Body mass index is 25.36 kg/m.  Wt Readings from Last 3 Encounters:  09/05/22 184 lb 6.4 oz (83.6 kg)  08/01/22 187 lb 10.4 oz (85.1 kg)  06/25/22 187 lb 3.2 oz (84.9 kg)     Reason for visit: nurse visit recent medication change Performed today: Vitals, Provider consulted:Revankar, and Education Changes (medications, testing, etc.) : No changes. Will keep the same and do a nurse visit in 2 weeks to re-evaluate. Length of Visit: 15 minutes    Medications Adjustments/Labs and Tests Ordered: No orders of the defined types were placed in this encounter.  No orders of the defined types were placed in this encounter.    Signed, Truddie Hidden, RN  09/05/2022 11:26 AM

## 2022-09-06 LAB — BASIC METABOLIC PANEL
BUN/Creatinine Ratio: 17 (ref 10–24)
BUN: 15 mg/dL (ref 8–27)
CO2: 23 mmol/L (ref 20–29)
Calcium: 9.6 mg/dL (ref 8.6–10.2)
Chloride: 105 mmol/L (ref 96–106)
Creatinine, Ser: 0.9 mg/dL (ref 0.76–1.27)
Glucose: 113 mg/dL — ABNORMAL HIGH (ref 70–99)
Potassium: 3.8 mmol/L (ref 3.5–5.2)
Sodium: 142 mmol/L (ref 134–144)
eGFR: 87 mL/min/{1.73_m2} (ref >59)

## 2022-09-19 ENCOUNTER — Ambulatory Visit: Payer: Medicare Other | Attending: Cardiology

## 2022-09-19 VITALS — BP 140/80 | HR 58 | Resp 18 | Ht 72.0 in | Wt 185.0 lb

## 2022-09-19 DIAGNOSIS — I429 Cardiomyopathy, unspecified: Secondary | ICD-10-CM

## 2022-09-19 NOTE — Progress Notes (Signed)
   Nurse Visit   Date of Encounter: 09/19/2022 ID: [name], DOB 1946-11-18, MRN [id number]  PCP:  Janine Limbo, [place] Providers Cardiologist:  Revankar   Visit Details   VS:  There were no vitals taken for this visit. , BMI There is no height or weight on file to calculate BMI.  Wt Readings from Last 3 Encounters:  09/05/22 184 lb 6.4 oz (83.6 kg)  08/01/22 187 lb 10.4 oz (85.1 kg)  06/25/22 187 lb 3.2 oz (84.9 kg)     Reason for visit: Vital signs and review log Performed today: Vitals, Provider consulted:Revankar, and Education Changes (medications, testing, etc.) : none Length of Visit: 15 minutes    Medications Adjustments/Labs and Tests Ordered: No orders of the defined types were placed in this encounter.  No orders of the defined types were placed in this encounter.    Signed, Truddie Hidden, RN  09/19/2022 8:39 AM

## 2022-09-20 ENCOUNTER — Ambulatory Visit: Payer: Medicare Other

## 2022-09-20 LAB — BASIC METABOLIC PANEL
BUN/Creatinine Ratio: 20 (ref 10–24)
BUN: 20 mg/dL (ref 8–27)
CO2: 25 mmol/L (ref 20–29)
Calcium: 9.6 mg/dL (ref 8.6–10.2)
Chloride: 107 mmol/L — ABNORMAL HIGH (ref 96–106)
Creatinine, Ser: 1.01 mg/dL (ref 0.76–1.27)
Glucose: 88 mg/dL (ref 70–99)
Potassium: 4.1 mmol/L (ref 3.5–5.2)
Sodium: 146 mmol/L — ABNORMAL HIGH (ref 134–144)
eGFR: 76 mL/min/{1.73_m2} (ref >59)

## 2022-09-27 ENCOUNTER — Telehealth: Payer: Self-pay

## 2022-09-27 MED ORDER — SPIRONOLACTONE 25 MG PO TABS: 12.5000 mg | ORAL_TABLET | Freq: Every day | ORAL | 3 refills | Status: DC

## 2022-09-27 NOTE — Telephone Encounter (Signed)
BP log review Dr. Geraldo Pitter wants you to you to start Spironolactone 25 mg take 1/2 tablet daily. Nurse visit 1 week for VS review log and labs. Pt aware and verbalized understanding.

## 2022-10-05 ENCOUNTER — Ambulatory Visit: Payer: Medicare Other | Attending: Cardiology

## 2022-10-05 VITALS — BP 144/78 | HR 54 | Ht 72.0 in | Wt 179.0 lb

## 2022-10-05 DIAGNOSIS — I509 Heart failure, unspecified: Secondary | ICD-10-CM

## 2022-10-05 MED ORDER — SPIRONOLACTONE 25 MG PO TABS: 25.0000 mg | ORAL_TABLET | Freq: Every day | ORAL | 3 refills | Status: DC

## 2022-10-05 NOTE — Progress Notes (Unsigned)
   Nurse Visit   Date of Encounter: 10/05/2022 ID: Shanon Rosser, DOB 12-24-1945, MRN [id number]  PCP:  Janine Limbo, PA-C   [place] Providers Cardiologist:  None { Click to update primary MD,subspecialty MD or APP then REFRESH:1}     Visit Details   VS:  BP (!) 144/78 (BP Location: Right Arm, Patient Position: Sitting, Cuff Size: Normal)   Pulse (!) 54   Ht 6' (1.829 m)   Wt 179 lb (81.2 kg)   SpO2 98%   BMI 24.28 kg/m  , BMI Body mass index is 24.28 kg/m.  Wt Readings from Last 3 Encounters:  10/05/22 179 lb (81.2 kg)  09/19/22 185 lb (83.9 kg)  09/05/22 184 lb 6.4 oz (83.6 kg)     Reason for visit: Review blood pressure log and have lab work drawn Performed today: Vital signs, lab work and consulted provider Changes (medications, testing, etc.) : Spirinolactone 25 mg daily, keep a blood pressure for two weeks and set-up a nurse visit for the patient to bring in their blood pressure log for review, also to have BMP lab drawn. Length of Visit: 20 minutes    Medications Adjustments/Labs and Tests Ordered: Orders Placed This Encounter  Procedures   Basic Metabolic Panel (BMET)   No orders of the defined types were placed in this encounter.    Signed, Louie Casa, RN  10/05/2022 2:31 PM

## 2022-10-06 LAB — BASIC METABOLIC PANEL
BUN/Creatinine Ratio: 16 (ref 10–24)
BUN: 16 mg/dL (ref 8–27)
CO2: 22 mmol/L (ref 20–29)
Calcium: 10 mg/dL (ref 8.6–10.2)
Chloride: 106 mmol/L (ref 96–106)
Creatinine, Ser: 1.01 mg/dL (ref 0.76–1.27)
Glucose: 104 mg/dL — ABNORMAL HIGH (ref 70–99)
Potassium: 4.3 mmol/L (ref 3.5–5.2)
Sodium: 143 mmol/L (ref 134–144)
eGFR: 76 mL/min/{1.73_m2} (ref >59)

## 2022-10-09 MED ORDER — SPIRONOLACTONE 25 MG PO TABS: 25.0000 mg | ORAL_TABLET | Freq: Every day | ORAL | 3 refills | Status: DC

## 2022-10-09 NOTE — Addendum Note (Signed)
Addended by: Truddie Hidden on: 10/09/2022 02:10 PM   Modules accepted: Orders

## 2022-10-17 ENCOUNTER — Ambulatory Visit: Payer: Medicare Other | Attending: Cardiology

## 2022-10-17 VITALS — BP 132/84 | HR 48

## 2022-10-17 DIAGNOSIS — I509 Heart failure, unspecified: Secondary | ICD-10-CM

## 2022-10-17 NOTE — Progress Notes (Signed)
   Nurse Visit   Date of Encounter: 10/17/2022 ID: [name], DOB 04-11-1946, MRN [id number]  PCP:  Janine Limbo, PA-C   [place] Providers Cardiologist:  None      Visit Details   VS:  BP 132/84 (BP Location: Right Arm, Patient Position: Sitting, Cuff Size: Normal)   Pulse (!) 48  , BMI There is no height or weight on file to calculate BMI.  Wt Readings from Last 3 Encounters:  10/05/22 179 lb (81.2 kg)  09/19/22 185 lb (83.9 kg)  09/05/22 184 lb 6.4 oz (83.6 kg)     Reason for visit: Review vital sign log and have BMP drawn Performed today: Vitals, provider consulted and education and lab draw Changes (medications, testing, etc.) : No new orders at this time. Length of Visit: 20 minutes    Medications Adjustments/Labs and Tests Ordered: No orders of the defined types were placed in this encounter.  No orders of the defined types were placed in this encounter.    Signed, Louie Casa, RN  10/17/2022 10:04 AM

## 2022-10-18 LAB — BASIC METABOLIC PANEL
BUN/Creatinine Ratio: 25 — ABNORMAL HIGH (ref 10–24)
BUN: 24 mg/dL (ref 8–27)
CO2: 24 mmol/L (ref 20–29)
Calcium: 10.1 mg/dL (ref 8.6–10.2)
Chloride: 104 mmol/L (ref 96–106)
Creatinine, Ser: 0.96 mg/dL (ref 0.76–1.27)
Glucose: 122 mg/dL — ABNORMAL HIGH (ref 70–99)
Potassium: 4.3 mmol/L (ref 3.5–5.2)
Sodium: 142 mmol/L (ref 134–144)
eGFR: 81 mL/min/{1.73_m2} (ref >59)

## 2022-10-30 NOTE — Telephone Encounter (Signed)
Spoke with patient after receiving fax from [employer] stating that they needed proof of income in order to process his application.  Patient stated he will have his daughter bring it up here when she brings his other application that is due 11/25/22.  He thanked me for the call and had no further questions.

## 2022-10-31 LAB — BASIC METABOLIC PANEL
BUN/Creatinine Ratio: 18 (ref 10–24)
BUN: 18 mg/dL (ref 8–27)
CO2: 24 mmol/L (ref 20–29)
Calcium: 9.7 mg/dL (ref 8.6–10.2)
Chloride: 108 mmol/L — ABNORMAL HIGH (ref 96–106)
Creatinine, Ser: 1 mg/dL (ref 0.76–1.27)
Glucose: 108 mg/dL — ABNORMAL HIGH (ref 70–99)
Potassium: 4 mmol/L (ref 3.5–5.2)
Sodium: 145 mmol/L — ABNORMAL HIGH (ref 134–144)
eGFR: 77 mL/min/{1.73_m2} (ref >59)

## 2022-10-31 MED ORDER — ENTRESTO 97-103 MG PO TABS: 1.0000 | ORAL_TABLET | Freq: Two times a day (BID) | ORAL | 3 refills | Status: DC

## 2022-10-31 NOTE — Addendum Note (Signed)
Addended by: Truddie Hidden on: 10/31/2022 09:38 AM   Modules accepted: Orders

## 2022-10-31 NOTE — Addendum Note (Signed)
Addended by: Truddie Hidden on: 10/31/2022 09:21 AM   Modules accepted: Orders

## 2022-11-14 ENCOUNTER — Other Ambulatory Visit: Payer: Self-pay

## 2022-11-14 DIAGNOSIS — E782 Mixed hyperlipidemia: Secondary | ICD-10-CM

## 2022-11-14 DIAGNOSIS — I2510 Atherosclerotic heart disease of native coronary artery without angina pectoris: Secondary | ICD-10-CM

## 2022-11-14 DIAGNOSIS — I10 Essential (primary) hypertension: Secondary | ICD-10-CM

## 2022-11-15 LAB — BASIC METABOLIC PANEL
BUN/Creatinine Ratio: 14 (ref 10–24)
BUN: 15 mg/dL (ref 8–27)
CO2: 24 mmol/L (ref 20–29)
Calcium: 10.5 mg/dL — ABNORMAL HIGH (ref 8.6–10.2)
Chloride: 108 mmol/L — ABNORMAL HIGH (ref 96–106)
Creatinine, Ser: 1.04 mg/dL (ref 0.76–1.27)
Glucose: 105 mg/dL — ABNORMAL HIGH (ref 70–99)
Potassium: 4.3 mmol/L (ref 3.5–5.2)
Sodium: 146 mmol/L — ABNORMAL HIGH (ref 134–144)
eGFR: 73 mL/min/{1.73_m2} (ref >59)

## 2022-11-16 ENCOUNTER — Telehealth: Payer: Self-pay

## 2022-11-16 DIAGNOSIS — I429 Cardiomyopathy, unspecified: Secondary | ICD-10-CM

## 2022-11-16 MED ORDER — CLONIDINE HCL 0.1 MG PO TABS: 0.1000 mg | ORAL_TABLET | Freq: Every day | ORAL | 3 refills | Status: DC

## 2022-11-16 NOTE — Telephone Encounter (Signed)
Spoke with pt about blood pressure log. Advised per Dr. Geraldo Pitter to add Clonidine 0.'1mg'$  daily and keep a BP log and bring it by in a week. Pt verbalized understanding and had no questions.

## 2022-11-28 DIAGNOSIS — I429 Cardiomyopathy, unspecified: Secondary | ICD-10-CM | POA: Diagnosis not present

## 2022-11-28 LAB — BASIC METABOLIC PANEL
BUN/Creatinine Ratio: 13 (ref 10–24)
BUN: 14 mg/dL (ref 8–27)
CO2: 23 mmol/L (ref 20–29)
Calcium: 9.9 mg/dL (ref 8.6–10.2)
Chloride: 108 mmol/L — ABNORMAL HIGH (ref 96–106)
Creatinine, Ser: 1.12 mg/dL (ref 0.76–1.27)
Glucose: 103 mg/dL — ABNORMAL HIGH (ref 70–99)
Potassium: 4.1 mmol/L (ref 3.5–5.2)
Sodium: 144 mmol/L (ref 134–144)
eGFR: 67 mL/min/{1.73_m2} (ref >59)

## 2022-11-28 NOTE — Addendum Note (Signed)
Addended by: Truddie Hidden on: 11/28/2022 09:29 AM   Modules accepted: Orders

## 2023-01-18 DIAGNOSIS — M4722 Other spondylosis with radiculopathy, cervical region: Secondary | ICD-10-CM | POA: Diagnosis not present

## 2023-01-18 DIAGNOSIS — M5412 Radiculopathy, cervical region: Secondary | ICD-10-CM | POA: Diagnosis not present

## 2023-01-18 DIAGNOSIS — Z1330 Encounter for screening examination for mental health and behavioral disorders, unspecified: Secondary | ICD-10-CM | POA: Diagnosis not present

## 2023-01-30 ENCOUNTER — Ambulatory Visit: Payer: PPO | Attending: Cardiology | Admitting: Cardiology

## 2023-01-30 ENCOUNTER — Encounter: Payer: Self-pay | Admitting: Cardiology

## 2023-01-30 VITALS — BP 128/64 | HR 70 | Ht 71.0 in | Wt 180.4 lb

## 2023-01-30 DIAGNOSIS — I429 Cardiomyopathy, unspecified: Secondary | ICD-10-CM

## 2023-01-30 DIAGNOSIS — E782 Mixed hyperlipidemia: Secondary | ICD-10-CM

## 2023-01-30 DIAGNOSIS — I10 Essential (primary) hypertension: Secondary | ICD-10-CM

## 2023-01-30 DIAGNOSIS — E088 Diabetes mellitus due to underlying condition with unspecified complications: Secondary | ICD-10-CM | POA: Diagnosis not present

## 2023-01-30 DIAGNOSIS — I2510 Atherosclerotic heart disease of native coronary artery without angina pectoris: Secondary | ICD-10-CM | POA: Diagnosis not present

## 2023-01-30 DIAGNOSIS — I509 Heart failure, unspecified: Secondary | ICD-10-CM

## 2023-01-30 MED ORDER — SPIRONOLACTONE 25 MG PO TABS: 12.5000 mg | ORAL_TABLET | Freq: Every day | ORAL | 3 refills | Status: DC

## 2023-01-30 NOTE — Progress Notes (Signed)
Cardiology Office Note:    Date:  01/30/2023   ID:  [name], DOB 06/26/1946, MRN [id number]  PCP:  Steven Limbo, PA-C  Cardiologist:  Steven Lindau, MD   Referring MD: Steven Limbo, PA-C    ASSESSMENT:    1. Coronary artery disease involving native coronary artery of native heart without angina pectoris   2. Essential hypertension   3. Diabetes mellitus due to underlying condition with unspecified complications ([place])   4. Mixed hyperlipidemia    PLAN:    In order of problems listed above:  Coronary artery disease: Cardiomyopathy: Secondary prevention stressed with the patient.  Importance of compliance with diet medication stressed and vocalized understanding.  In view of the possibility of postural hypotension I will cut spironolactone to half dose.  He is agreeable.  He will keep a track of his blood pressures. Cardiomyopathy: Echocardiogram will be done.  Patient is on guideline directed medical therapy.  CHF education given.  Salt intake issues and daily weight checks were emphasized. Essential hypertension: Blood pressure stable and diet was emphasized. Mixed dyslipidemia: On lipid-lowering medications followed by primary care.  Diet emphasized. Diabetes mellitus: Diet emphasized.  Diet: To be ambulatory to the best of his ability and he agrees. Patient will be seen in follow-up appointment in 6 months or earlier if the patient has any concerns    Medication Adjustments/Labs and Tests Ordered: Current medicines are reviewed at length with the patient today.  Concerns regarding medicines are outlined above.  No orders of the defined types were placed in this encounter.  No orders of the defined types were placed in this encounter.    No chief complaint on file.    History of Present Illness:    [name] is a 77 y.o. male.  Patient has past medical history of essential hypertension, coronary artery disease, cardiomyopathy, mixed dyslipidemia and  diabetes mellitus.  He denies any problems at this time and takes care of activities of daily living.  No chest pain orthopnea or PND.  There is history suggesting the possibility of mild postural hypotension.  His blood pressure is steady and his blood pressure is very similar to what we have here at home.  At the time of my evaluation, the patient is alert awake oriented and in no distress.  Past Medical History:  Diagnosis Date   Abnormality of gait 03/31/2013   ALLERGIC RHINITIS 11/06/2007   Anemia, unspecified 08/22/2012   BENIGN PROSTATIC HYPERTROPHY 07/14/2007   CAD (coronary artery disease) 08/01/2022   Cardiomyopathy ([name]) 06/11/2022   Cervical spondylosis with radiculopathy 06/11/2022   Chronic osteomyelitis of toe of left foot ([name]) 09/12/2017   Chronic right shoulder pain 06/29/2022   COLONIC POLYPS, HX OF 0000000   Complication of anesthesia    pt states he needs to be cathed after surgery   COVID-19    Degenerative arthritis of right knee 12/12/2011   Diabetes ([name]) 02/03/2013   Diet controlled   Diabetes mellitus due to underlying condition with unspecified complications ([name]) [id number]   Disequilibrium 03/31/2013   Dyspnea 08/31/2019   Encounter for well adult exam with abnormal findings 12/10/2011   Essential hypertension 07/11/2007   Qualifier: Diagnosis of  By: [name]    Former smoker 02/08/2015   GERD 07/11/2007   Hypercalcemia 08/12/2015   Hyperlipidemia    Hyperthyroidism    Hypervitaminosis D 01/14/2019   Hypothyroidism    Hypoxia    Lacunar infarction ([place]) 03/31/2013   Microalbuminuria  08/31/2019   Neck pain 03/26/2022   Neural foraminal stenosis of cervical spine 06/11/2022   OSTEOARTHRITIS, KNEE, RIGHT 10/27/2008   PERIPHERAL EDEMA 05/31/2009   Polyarthralgia 09/12/2017   Primary osteoarthritis of right shoulder 06/29/2022   PULMONARY NODULE 06/24/2008   granuloma   Recurrent falls 08/17/2016   Right shoulder pain 08/17/2016    Rotator cuff arthropathy 09/07/2016   Injected 09/07/2016 Repeat injection 01/07/17 Repeat injection April 20th 2018. Repeat injection 05/22/2017 Repeat injection 08/15/2017   Skin lesion 02/19/2017   Spinal stenosis in cervical region 03/31/2013   Spondylosis, cervical, with myelopathy 11/11/2012   TIA (transient ischemic attack) 08/13/2015   Upper back pain on right side 08/31/2019   Vitamin D deficiency 08/12/2015   Weight loss 08/31/2019    Past Surgical History:  Procedure Laterality Date   ANTERIOR CERVICAL DECOMP/DISCECTOMY FUSION N/A 01/06/2013   Procedure: ANTERIOR CERVICAL DECOMPRESSION/DISCECTOMY FUSION 1 LEVEL;  Surgeon: Erline Levine, MD;  Location: [facility];  Service: Neurosurgery;  Laterality: N/A;  Cervical three-four Anterior cervical decompression/diskectomy/fusion   BACK SURGERY  1986   x 2  1986 C 3   HERNIA REPAIR  sept 1986   left knee replacement  Nov 21, 2007   NECK SURGERY     NISSEN FUNDOPLICATION  [id number]   right heel bone spur  1987   TOTAL KNEE ARTHROPLASTY  01/09/2012   Procedure: TOTAL KNEE ARTHROPLASTY;  Surgeon: Tobi Bastos, MD;  Location: [facility];  Service: Orthopedics;  Laterality: Right;    Current Medications: Current Meds  Medication Sig   ACCU-CHEK GUIDE test strip USE AS INSTRUCTED ONCE  DAILY   Accu-Chek Softclix Lancets lancets USE AS DIRECTED ONCE DAILY   albuterol (VENTOLIN HFA) 108 (90 Base) MCG/ACT inhaler Inhale 1-2 puffs into the lungs every 6 (six) hours as needed for wheezing or shortness of breath.   aspirin EC 325 MG tablet Take 1 tablet (325 mg total) by mouth once as needed (when stroke-like symptoms present).   atorvastatin (LIPITOR) 80 MG tablet Take 1 tablet (80 mg total) by mouth daily.   Blood Glucose Monitoring Suppl (ACCU-CHEK GUIDE) w/Device KIT 1 Device by Does not apply route daily. Once daily E11.9   budesonide-formoterol (SYMBICORT) 160-4.5 MCG/ACT inhaler Inhale 2 puffs into the lungs 2 (two) times daily.    cetirizine (ZYRTEC) 10 MG tablet Take 1 tablet (10 mg total) by mouth daily.   cloNIDine (CATAPRES) 0.1 MG tablet Take 1 tablet (0.1 mg total) by mouth daily.   clopidogrel (PLAVIX) 75 MG tablet Take 1 tablet (75 mg total) by mouth daily.   famotidine (PEPCID) 40 MG tablet Take 40 mg by mouth daily.   fenofibrate 160 MG tablet TAKE 1 TABLET BY MOUTH  DAILY   fluticasone (FLONASE) 50 MCG/ACT nasal spray Place 1 spray into both nostrils as needed for allergies or rhinitis.   gabapentin (NEURONTIN) 400 MG capsule TAKE 1 CAPSULE BY MOUTH 3  TIMES DAILY   glucose blood test strip 1 each by Other route 2 (two) times daily. Use to check blood sugars twice a day Dx E11.9   Ipratropium-Albuterol (COMBIVENT RESPIMAT IN) Inhale 1-2 puffs into the lungs daily.   labetalol (NORMODYNE) 300 MG tablet TAKE 1 TABLET BY MOUTH  TWICE DAILY   levothyroxine (SYNTHROID) 100 MCG tablet Take 100 mcg by mouth daily before breakfast.   LORazepam (ATIVAN) 1 MG tablet Take 0.5 tablets by mouth at bedtime.   metFORMIN (GLUCOPHAGE-XR) 500 MG 24 hr tablet TAKE 4 TABLETS BY  MOUTH  DAILY WITH BREAKFAST   montelukast (SINGULAIR) 10 MG tablet Take 10 mg by mouth daily.   MYRBETRIQ 25 MG TB24 tablet Take 25 mg by mouth daily.   nitroGLYCERIN (NITROSTAT) 0.4 MG SL tablet Place 0.4 mg under the tongue every 5 (five) minutes as needed for chest pain.   sacubitril-valsartan (ENTRESTO) 97-103 MG Take 1 tablet by mouth 2 (two) times daily.   spironolactone (ALDACTONE) 25 MG tablet Take 1 tablet (25 mg total) by mouth daily.     Allergies:   Latex, Zolpidem tartrate, Codeine, Hct [hydrochlorothiazide], Metformin and related, Naproxen, Prednisone, Zolpidem tartrate, Adhesive [tape], and Penicillins   Social History   Socioeconomic History   Marital status: Married    Spouse name: [name]   Number of children: 2   Years of education: HS   Highest education level: Not on file  Occupational History   Occupation: Retired  Tobacco  Use   Smoking status: Former    Packs/day: 3.00    Years: 30.00    Total pack years: 90.00    Types: Cigarettes    Quit date: 11/26/1982    Years since quitting: 40.2   Smokeless tobacco: Never  Substance and Sexual Activity   Alcohol use: No   Drug use: No   Sexual activity: Not on file  Other Topics Concern   Not on file  Social History Narrative   Patient lives at home with spouse.   Caffiene Use: 4 cups daily   Social Determinants of Health   Financial Resource Strain: Not on file  Food Insecurity: Not on file  Transportation Needs: Not on file  Physical Activity: Not on file  Stress: Not on file  Social Connections: Not on file     Family History: The patient's family history includes Emphysema in his father; Heart attack in his father and mother; Heart disease in an other family member. There is no history of Diabetes, Thyroid disease, or Hypercalcemia.  ROS:   Please see the history of present illness.    All other systems reviewed and are negative.  EKGs/Labs/Other Studies Reviewed:    The following studies were reviewed today: I discussed my findings with the patient   Recent Labs: 07/23/2022: ALT 16 11/28/2022: BUN 14; Creatinine, Ser 1.12; Potassium 4.1; Sodium 144  Recent Lipid Panel    Component Value Date/Time   CHOL 147 07/23/2022 0825   TRIG 113 07/23/2022 0825   HDL 40 07/23/2022 0825   CHOLHDL 3.7 07/23/2022 0825   CHOLHDL 3 02/22/2020 1301   VLDL 34.6 02/22/2020 1301   LDLCALC 86 07/23/2022 0825   LDLDIRECT 84.0 08/25/2018 0848    Physical Exam:    VS:  BP 128/64   Pulse 70   Ht '5\' 11"'$  (1.803 m)   Wt 180 lb 6.4 oz (81.8 kg)   SpO2 96%   BMI 25.16 kg/m     Wt Readings from Last 3 Encounters:  01/30/23 180 lb 6.4 oz (81.8 kg)  10/05/22 179 lb (81.2 kg)  09/19/22 185 lb (83.9 kg)     GEN: Patient is in no acute distress HEENT: Normal NECK: No JVD; No carotid bruits LYMPHATICS: No lymphadenopathy CARDIAC: Hear sounds regular, 2/6  systolic murmur at the apex. RESPIRATORY:  Clear to auscultation without rales, wheezing or rhonchi  ABDOMEN: Soft, non-tender, non-distended MUSCULOSKELETAL:  No edema; No deformity  SKIN: Warm and dry NEUROLOGIC:  Alert and oriented x 3 PSYCHIATRIC:  Normal affect   Signed, Steven Lindau, MD  01/30/2023 2:38 PM    [facility]

## 2023-01-30 NOTE — Patient Instructions (Signed)
Medication Instructions:  Your physician has recommended you make the following change in your medication:   Decrease your Spironolactone to 12.5 mg tablet daily.  *If you need a refill on your cardiac medications before your next appointment, please call your pharmacy*   Lab Work: None ordered If you have labs (blood work) drawn today and your tests are completely normal, you will receive your results only by: [place] (if you have MyChart) OR A paper copy in the mail If you have any lab test that is abnormal or we need to change your treatment, we will call you to review the results.   Testing/Procedures: Your physician has requested that you have an echocardiogram. Echocardiography is a painless test that uses sound waves to create images of your heart. It provides your doctor with information about the size and shape of your heart and how well your heart's chambers and valves are working. This procedure takes approximately one hour. There are no restrictions for this procedure. Please do NOT wear cologne, perfume, aftershave, or lotions (deodorant is allowed). Please arrive 15 minutes prior to your appointment time.     Follow-Up: At [facility], you and your health needs are our priority.  As part of our continuing mission to provide you with exceptional heart care, we have created designated Provider Care Teams.  These Care Teams include your primary Cardiologist (physician) and Advanced Practice Providers (APPs -  Physician Assistants and Nurse Practitioners) who all work together to provide you with the care you need, when you need it.  We recommend signing up for the patient portal called "MyChart".  Sign up information is provided on this After Visit Summary.  MyChart is used to connect with patients for Virtual Visits (Telemedicine).  Patients are able to view lab/test results, encounter notes, upcoming appointments, etc.  Non-urgent messages can be sent to your  provider as well.   To learn more about what you can do with MyChart, go to [email or web address].    Your next appointment:   9 month(s)  The format for your next appointment:   In Person  Provider:   Jyl Heinz, MD   Other Instructions Echocardiogram An echocardiogram is a test that uses sound waves (ultrasound) to produce images of the heart. Images from an echocardiogram can provide important information about: Heart size and shape. The size and thickness and movement of your heart's walls. Heart muscle function and strength. Heart valve function or if you have stenosis. Stenosis is when the heart valves are too narrow. If blood is flowing backward through the heart valves (regurgitation). A tumor or infectious growth around the heart valves. Areas of heart muscle that are not working well because of poor blood flow or injury from a heart attack. Aneurysm detection. An aneurysm is a weak or damaged part of an artery wall. The wall bulges out from the normal force of blood pumping through the body. Tell a health care provider about: Any allergies you have. All medicines you are taking, including vitamins, herbs, eye drops, creams, and over-the-counter medicines. Any blood disorders you have. Any surgeries you have had. Any medical conditions you have. Whether you are pregnant or may be pregnant. What are the risks? Generally, this is a safe test. However, problems may occur, including an allergic reaction to dye (contrast) that may be used during the test. What happens before the test? No specific preparation is needed. You may eat and drink normally. What happens during the test? You will take  off your clothes from the waist up and put on a hospital gown. Electrodes or electrocardiogram (ECG)patches may be placed on your chest. The electrodes or patches are then connected to a device that monitors your heart rate and rhythm. You will lie down on a table for an  ultrasound exam. A gel will be applied to your chest to help sound waves pass through your skin. A handheld device, called a transducer, will be pressed against your chest and moved over your heart. The transducer produces sound waves that travel to your heart and bounce back (or "echo" back) to the transducer. These sound waves will be captured in real-time and changed into images of your heart that can be viewed on a video monitor. The images will be recorded on a computer and reviewed by your health care provider. You may be asked to change positions or hold your breath for a short time. This makes it easier to get different views or better views of your heart. In some cases, you may receive contrast through an IV in one of your veins. This can improve the quality of the pictures from your heart. The procedure may vary among health care providers and hospitals.   What can I expect after the test? You may return to your normal, everyday life, including diet, activities, and medicines, unless your health care provider tells you not to do that. Follow these instructions at home: It is up to you to get the results of your test. Ask your health care provider, or the department that is doing the test, when your results will be ready. Keep all follow-up visits. This is important. Summary An echocardiogram is a test that uses sound waves (ultrasound) to produce images of the heart. Images from an echocardiogram can provide important information about the size and shape of your heart, heart muscle function, heart valve function, and other possible heart problems. You do not need to do anything to prepare before this test. You may eat and drink normally. After the echocardiogram is completed, you may return to your normal, everyday life, unless your health care provider tells you not to do that. This information is not intended to replace advice given to you by your health care provider. Make sure you  discuss any questions you have with your health care provider. Document Revised: 07/05/2020 Document Reviewed: 07/05/2020 Elsevier Patient Education  [place]

## 2023-02-06 DIAGNOSIS — G8929 Other chronic pain: Secondary | ICD-10-CM | POA: Diagnosis not present

## 2023-02-06 DIAGNOSIS — Z9181 History of falling: Secondary | ICD-10-CM | POA: Diagnosis not present

## 2023-02-06 DIAGNOSIS — E1169 Type 2 diabetes mellitus with other specified complication: Secondary | ICD-10-CM | POA: Diagnosis not present

## 2023-02-06 DIAGNOSIS — I10 Essential (primary) hypertension: Secondary | ICD-10-CM | POA: Diagnosis not present

## 2023-02-06 DIAGNOSIS — M549 Dorsalgia, unspecified: Secondary | ICD-10-CM | POA: Diagnosis not present

## 2023-02-06 DIAGNOSIS — E039 Hypothyroidism, unspecified: Secondary | ICD-10-CM | POA: Diagnosis not present

## 2023-02-06 DIAGNOSIS — I700 Atherosclerosis of aorta: Secondary | ICD-10-CM | POA: Diagnosis not present

## 2023-02-06 DIAGNOSIS — Z6824 Body mass index (BMI) 24.0-24.9, adult: Secondary | ICD-10-CM | POA: Diagnosis not present

## 2023-02-06 DIAGNOSIS — E785 Hyperlipidemia, unspecified: Secondary | ICD-10-CM | POA: Diagnosis not present

## 2023-02-06 DIAGNOSIS — Z139 Encounter for screening, unspecified: Secondary | ICD-10-CM | POA: Diagnosis not present

## 2023-02-06 DIAGNOSIS — Z79899 Other long term (current) drug therapy: Secondary | ICD-10-CM | POA: Diagnosis not present

## 2023-02-11 DIAGNOSIS — M5412 Radiculopathy, cervical region: Secondary | ICD-10-CM | POA: Diagnosis not present

## 2023-02-13 ENCOUNTER — Ambulatory Visit: Payer: PPO | Attending: Cardiology

## 2023-02-13 DIAGNOSIS — I2510 Atherosclerotic heart disease of native coronary artery without angina pectoris: Secondary | ICD-10-CM

## 2023-02-13 DIAGNOSIS — I429 Cardiomyopathy, unspecified: Secondary | ICD-10-CM

## 2023-02-13 DIAGNOSIS — I509 Heart failure, unspecified: Secondary | ICD-10-CM

## 2023-02-13 LAB — ECHOCARDIOGRAM COMPLETE
Calc EF: 35.5 %
P 1/2 time: 1197 msec
S' Lateral: 3.8 cm
Single Plane A2C EF: 40.5 %
Single Plane A4C EF: 33.7 %

## 2023-02-15 ENCOUNTER — Telehealth: Payer: Self-pay

## 2023-02-15 MED ORDER — DAPAGLIFLOZIN PROPANEDIOL 10 MG PO TABS: 10.0000 mg | ORAL_TABLET | Freq: Every day | ORAL | 12 refills | Status: DC

## 2023-02-15 NOTE — Telephone Encounter (Signed)
-----   Message from Jenean Lindau, MD sent at 02/13/2023  1:17 PM EDT ----- Diminished EF.  Let me know if we have tried [place] or [place] on him.  Copy primary care Jenean Lindau, MD 02/13/2023 1:17 PM

## 2023-02-26 DIAGNOSIS — G2581 Restless legs syndrome: Secondary | ICD-10-CM | POA: Diagnosis not present

## 2023-02-26 DIAGNOSIS — J4540 Moderate persistent asthma, uncomplicated: Secondary | ICD-10-CM | POA: Diagnosis not present

## 2023-02-26 DIAGNOSIS — R5383 Other fatigue: Secondary | ICD-10-CM | POA: Diagnosis not present

## 2023-02-26 DIAGNOSIS — R911 Solitary pulmonary nodule: Secondary | ICD-10-CM | POA: Diagnosis not present

## 2023-02-26 DIAGNOSIS — I509 Heart failure, unspecified: Secondary | ICD-10-CM | POA: Diagnosis not present

## 2023-02-26 DIAGNOSIS — R0600 Dyspnea, unspecified: Secondary | ICD-10-CM | POA: Diagnosis not present

## 2023-02-26 DIAGNOSIS — J301 Allergic rhinitis due to pollen: Secondary | ICD-10-CM | POA: Diagnosis not present

## 2023-02-27 DIAGNOSIS — M75101 Unspecified rotator cuff tear or rupture of right shoulder, not specified as traumatic: Secondary | ICD-10-CM | POA: Diagnosis not present

## 2023-02-27 DIAGNOSIS — M25511 Pain in right shoulder: Secondary | ICD-10-CM | POA: Diagnosis not present

## 2023-02-27 DIAGNOSIS — G8929 Other chronic pain: Secondary | ICD-10-CM | POA: Diagnosis not present

## 2023-02-27 DIAGNOSIS — M5412 Radiculopathy, cervical region: Secondary | ICD-10-CM | POA: Diagnosis not present

## 2023-02-27 DIAGNOSIS — M12811 Other specific arthropathies, not elsewhere classified, right shoulder: Secondary | ICD-10-CM | POA: Diagnosis not present

## 2023-02-27 DIAGNOSIS — M4722 Other spondylosis with radiculopathy, cervical region: Secondary | ICD-10-CM | POA: Diagnosis not present

## 2023-04-01 DIAGNOSIS — J449 Chronic obstructive pulmonary disease, unspecified: Secondary | ICD-10-CM | POA: Diagnosis not present

## 2023-04-01 DIAGNOSIS — R911 Solitary pulmonary nodule: Secondary | ICD-10-CM | POA: Diagnosis not present

## 2023-04-01 DIAGNOSIS — G2581 Restless legs syndrome: Secondary | ICD-10-CM | POA: Diagnosis not present

## 2023-04-01 DIAGNOSIS — R5383 Other fatigue: Secondary | ICD-10-CM | POA: Diagnosis not present

## 2023-04-01 DIAGNOSIS — R0600 Dyspnea, unspecified: Secondary | ICD-10-CM | POA: Diagnosis not present

## 2023-04-01 DIAGNOSIS — J301 Allergic rhinitis due to pollen: Secondary | ICD-10-CM | POA: Diagnosis not present

## 2023-04-01 DIAGNOSIS — I509 Heart failure, unspecified: Secondary | ICD-10-CM | POA: Diagnosis not present

## 2023-04-09 ENCOUNTER — Encounter (HOSPITAL_COMMUNITY): Payer: Self-pay | Admitting: Emergency Medicine

## 2023-04-09 ENCOUNTER — Other Ambulatory Visit: Payer: Self-pay

## 2023-04-09 ENCOUNTER — Inpatient Hospital Stay (HOSPITAL_COMMUNITY)
Admission: EM | Admit: 2023-04-09 | Discharge: 2023-04-12 | DRG: 369 | Disposition: A | Discharge status: Home / Self Care | Payer: PPO | Attending: Family Medicine | Admitting: Family Medicine

## 2023-04-09 DIAGNOSIS — E876 Hypokalemia: Secondary | ICD-10-CM | POA: Diagnosis present

## 2023-04-09 DIAGNOSIS — Z79890 Hormone replacement therapy (postmenopausal): Secondary | ICD-10-CM | POA: Diagnosis not present

## 2023-04-09 DIAGNOSIS — Z96652 Presence of left artificial knee joint: Secondary | ICD-10-CM | POA: Diagnosis present

## 2023-04-09 DIAGNOSIS — Z6824 Body mass index (BMI) 24.0-24.9, adult: Secondary | ICD-10-CM

## 2023-04-09 DIAGNOSIS — K5900 Constipation, unspecified: Secondary | ICD-10-CM | POA: Diagnosis present

## 2023-04-09 DIAGNOSIS — I10 Essential (primary) hypertension: Secondary | ICD-10-CM | POA: Diagnosis present

## 2023-04-09 DIAGNOSIS — E860 Dehydration: Secondary | ICD-10-CM | POA: Diagnosis present

## 2023-04-09 DIAGNOSIS — K2101 Gastro-esophageal reflux disease with esophagitis, with bleeding: Secondary | ICD-10-CM | POA: Diagnosis present

## 2023-04-09 DIAGNOSIS — I110 Hypertensive heart disease with heart failure: Secondary | ICD-10-CM | POA: Diagnosis present

## 2023-04-09 DIAGNOSIS — E785 Hyperlipidemia, unspecified: Secondary | ICD-10-CM | POA: Diagnosis present

## 2023-04-09 DIAGNOSIS — Z7982 Long term (current) use of aspirin: Secondary | ICD-10-CM

## 2023-04-09 DIAGNOSIS — I2510 Atherosclerotic heart disease of native coronary artery without angina pectoris: Secondary | ICD-10-CM | POA: Diagnosis present

## 2023-04-09 DIAGNOSIS — Z87891 Personal history of nicotine dependence: Secondary | ICD-10-CM

## 2023-04-09 DIAGNOSIS — R079 Chest pain, unspecified: Secondary | ICD-10-CM

## 2023-04-09 DIAGNOSIS — K209 Esophagitis, unspecified without bleeding: Secondary | ICD-10-CM | POA: Diagnosis not present

## 2023-04-09 DIAGNOSIS — Z7902 Long term (current) use of antithrombotics/antiplatelets: Secondary | ICD-10-CM

## 2023-04-09 DIAGNOSIS — R634 Abnormal weight loss: Secondary | ICD-10-CM | POA: Diagnosis present

## 2023-04-09 DIAGNOSIS — Z8673 Personal history of transient ischemic attack (TIA), and cerebral infarction without residual deficits: Secondary | ICD-10-CM

## 2023-04-09 DIAGNOSIS — R1319 Other dysphagia: Secondary | ICD-10-CM | POA: Diagnosis not present

## 2023-04-09 DIAGNOSIS — Z8719 Personal history of other diseases of the digestive system: Secondary | ICD-10-CM | POA: Diagnosis not present

## 2023-04-09 DIAGNOSIS — K2100 Gastro-esophageal reflux disease with esophagitis, without bleeding: Secondary | ICD-10-CM | POA: Diagnosis present

## 2023-04-09 DIAGNOSIS — K920 Hematemesis: Secondary | ICD-10-CM | POA: Diagnosis present

## 2023-04-09 DIAGNOSIS — E8809 Other disorders of plasma-protein metabolism, not elsewhere classified: Secondary | ICD-10-CM | POA: Diagnosis present

## 2023-04-09 DIAGNOSIS — D62 Acute posthemorrhagic anemia: Secondary | ICD-10-CM | POA: Insufficient documentation

## 2023-04-09 DIAGNOSIS — Z825 Family history of asthma and other chronic lower respiratory diseases: Secondary | ICD-10-CM

## 2023-04-09 DIAGNOSIS — I5022 Chronic systolic (congestive) heart failure: Secondary | ICD-10-CM | POA: Diagnosis present

## 2023-04-09 DIAGNOSIS — K449 Diaphragmatic hernia without obstruction or gangrene: Secondary | ICD-10-CM | POA: Diagnosis present

## 2023-04-09 DIAGNOSIS — N400 Enlarged prostate without lower urinary tract symptoms: Secondary | ICD-10-CM | POA: Diagnosis present

## 2023-04-09 DIAGNOSIS — I5020 Unspecified systolic (congestive) heart failure: Secondary | ICD-10-CM | POA: Diagnosis present

## 2023-04-09 DIAGNOSIS — R1310 Dysphagia, unspecified: Secondary | ICD-10-CM | POA: Diagnosis not present

## 2023-04-09 DIAGNOSIS — I679 Cerebrovascular disease, unspecified: Secondary | ICD-10-CM | POA: Diagnosis not present

## 2023-04-09 DIAGNOSIS — J849 Interstitial pulmonary disease, unspecified: Secondary | ICD-10-CM | POA: Diagnosis not present

## 2023-04-09 DIAGNOSIS — Z79899 Other long term (current) drug therapy: Secondary | ICD-10-CM

## 2023-04-09 DIAGNOSIS — E039 Hypothyroidism, unspecified: Secondary | ICD-10-CM | POA: Diagnosis present

## 2023-04-09 DIAGNOSIS — E782 Mixed hyperlipidemia: Secondary | ICD-10-CM | POA: Diagnosis not present

## 2023-04-09 DIAGNOSIS — J449 Chronic obstructive pulmonary disease, unspecified: Secondary | ICD-10-CM | POA: Diagnosis present

## 2023-04-09 DIAGNOSIS — I509 Heart failure, unspecified: Secondary | ICD-10-CM | POA: Diagnosis not present

## 2023-04-09 DIAGNOSIS — Z7984 Long term (current) use of oral hypoglycemic drugs: Secondary | ICD-10-CM

## 2023-04-09 DIAGNOSIS — Z91048 Other nonmedicinal substance allergy status: Secondary | ICD-10-CM

## 2023-04-09 DIAGNOSIS — Z888 Allergy status to other drugs, medicaments and biological substances status: Secondary | ICD-10-CM

## 2023-04-09 DIAGNOSIS — Z7951 Long term (current) use of inhaled steroids: Secondary | ICD-10-CM

## 2023-04-09 DIAGNOSIS — Z8249 Family history of ischemic heart disease and other diseases of the circulatory system: Secondary | ICD-10-CM

## 2023-04-09 DIAGNOSIS — R112 Nausea with vomiting, unspecified: Secondary | ICD-10-CM | POA: Diagnosis not present

## 2023-04-09 DIAGNOSIS — Z885 Allergy status to narcotic agent status: Secondary | ICD-10-CM

## 2023-04-09 DIAGNOSIS — E119 Type 2 diabetes mellitus without complications: Secondary | ICD-10-CM | POA: Diagnosis present

## 2023-04-09 DIAGNOSIS — R1314 Dysphagia, pharyngoesophageal phase: Secondary | ICD-10-CM | POA: Diagnosis present

## 2023-04-09 DIAGNOSIS — Z981 Arthrodesis status: Secondary | ICD-10-CM

## 2023-04-09 DIAGNOSIS — Z91040 Latex allergy status: Secondary | ICD-10-CM

## 2023-04-09 DIAGNOSIS — Z880 Allergy status to penicillin: Secondary | ICD-10-CM

## 2023-04-09 DIAGNOSIS — Z8711 Personal history of peptic ulcer disease: Secondary | ICD-10-CM

## 2023-04-09 DIAGNOSIS — K2289 Other specified disease of esophagus: Secondary | ICD-10-CM | POA: Diagnosis not present

## 2023-04-09 DIAGNOSIS — Q438 Other specified congenital malformations of intestine: Secondary | ICD-10-CM | POA: Diagnosis not present

## 2023-04-09 LAB — TYPE AND SCREEN
ABO/RH(D): O POS
Antibody Screen: NEGATIVE

## 2023-04-09 LAB — COMPREHENSIVE METABOLIC PANEL
ALT: 23 U/L (ref 0–44)
AST: 27 U/L (ref 15–41)
Albumin: 3.2 g/dL — ABNORMAL LOW (ref 3.5–5.0)
Alkaline Phosphatase: 48 U/L (ref 38–126)
Anion gap: 16 — ABNORMAL HIGH (ref 5–15)
BUN: 17 mg/dL (ref 8–23)
CO2: 24 mmol/L (ref 22–32)
Calcium: 9.3 mg/dL (ref 8.9–10.3)
Chloride: 98 mmol/L (ref 98–111)
Creatinine, Ser: 1.13 mg/dL (ref 0.61–1.24)
GFR, Estimated: >60 mL/min (ref >60)
Glucose, Bld: 126 mg/dL — ABNORMAL HIGH (ref 70–99)
Potassium: 3.1 mmol/L — ABNORMAL LOW (ref 3.5–5.1)
Sodium: 138 mmol/L (ref 135–145)
Total Bilirubin: 1.4 mg/dL — ABNORMAL HIGH (ref 0.3–1.2)
Total Protein: 6.9 g/dL (ref 6.5–8.1)

## 2023-04-09 LAB — CBC
HCT: 42 % (ref 39.0–52.0)
Hemoglobin: 13.7 g/dL (ref 13.0–17.0)
MCH: 30.2 pg (ref 26.0–34.0)
MCHC: 32.6 g/dL (ref 30.0–36.0)
MCV: 92.7 fL (ref 80.0–100.0)
Platelets: 341 10*3/uL (ref 150–400)
RBC: 4.53 MIL/uL (ref 4.22–5.81)
RDW: 14 % (ref 11.5–15.5)
WBC: 10.2 10*3/uL (ref 4.0–10.5)
nRBC: 0 % (ref 0.0–0.2)

## 2023-04-09 NOTE — ED Triage Notes (Signed)
Patient reports n/v x 4 days.  Patient reports vomit is "black."  Patient also endorses no bowel movement since last Tuesday. Patient c/o right flank pain.

## 2023-04-10 ENCOUNTER — Emergency Department (HOSPITAL_COMMUNITY): Payer: PPO

## 2023-04-10 ENCOUNTER — Observation Stay (HOSPITAL_COMMUNITY): Payer: PPO

## 2023-04-10 DIAGNOSIS — E782 Mixed hyperlipidemia: Secondary | ICD-10-CM

## 2023-04-10 DIAGNOSIS — E039 Hypothyroidism, unspecified: Secondary | ICD-10-CM | POA: Diagnosis not present

## 2023-04-10 DIAGNOSIS — E119 Type 2 diabetes mellitus without complications: Secondary | ICD-10-CM

## 2023-04-10 DIAGNOSIS — R1319 Other dysphagia: Secondary | ICD-10-CM | POA: Diagnosis not present

## 2023-04-10 DIAGNOSIS — R1310 Dysphagia, unspecified: Secondary | ICD-10-CM

## 2023-04-10 DIAGNOSIS — R079 Chest pain, unspecified: Secondary | ICD-10-CM

## 2023-04-10 DIAGNOSIS — K920 Hematemesis: Secondary | ICD-10-CM | POA: Diagnosis not present

## 2023-04-10 DIAGNOSIS — E876 Hypokalemia: Secondary | ICD-10-CM

## 2023-04-10 DIAGNOSIS — K2100 Gastro-esophageal reflux disease with esophagitis, without bleeding: Secondary | ICD-10-CM

## 2023-04-10 DIAGNOSIS — I5020 Unspecified systolic (congestive) heart failure: Secondary | ICD-10-CM

## 2023-04-10 DIAGNOSIS — I10 Essential (primary) hypertension: Secondary | ICD-10-CM

## 2023-04-10 DIAGNOSIS — Q438 Other specified congenital malformations of intestine: Secondary | ICD-10-CM

## 2023-04-10 DIAGNOSIS — Z7902 Long term (current) use of antithrombotics/antiplatelets: Secondary | ICD-10-CM | POA: Diagnosis not present

## 2023-04-10 DIAGNOSIS — K209 Esophagitis, unspecified without bleeding: Secondary | ICD-10-CM | POA: Diagnosis not present

## 2023-04-10 DIAGNOSIS — K449 Diaphragmatic hernia without obstruction or gangrene: Secondary | ICD-10-CM | POA: Diagnosis not present

## 2023-04-10 DIAGNOSIS — D62 Acute posthemorrhagic anemia: Secondary | ICD-10-CM | POA: Diagnosis not present

## 2023-04-10 DIAGNOSIS — R112 Nausea with vomiting, unspecified: Secondary | ICD-10-CM | POA: Diagnosis not present

## 2023-04-10 HISTORY — DX: Hypokalemia: E87.6

## 2023-04-10 HISTORY — DX: Unspecified systolic (congestive) heart failure: I50.20

## 2023-04-10 HISTORY — DX: Dysphagia, unspecified: R13.10

## 2023-04-10 HISTORY — DX: Hematemesis: K92.0

## 2023-04-10 HISTORY — DX: Gastro-esophageal reflux disease with esophagitis, without bleeding: K21.00

## 2023-04-10 LAB — BASIC METABOLIC PANEL
Anion gap: 14 (ref 5–15)
BUN: 14 mg/dL (ref 8–23)
CO2: 25 mmol/L (ref 22–32)
Calcium: 8.7 mg/dL — ABNORMAL LOW (ref 8.9–10.3)
Chloride: 100 mmol/L (ref 98–111)
Creatinine, Ser: 0.95 mg/dL (ref 0.61–1.24)
GFR, Estimated: >60 mL/min (ref >60)
Glucose, Bld: 111 mg/dL — ABNORMAL HIGH (ref 70–99)
Potassium: 2.9 mmol/L — ABNORMAL LOW (ref 3.5–5.1)
Sodium: 139 mmol/L (ref 135–145)

## 2023-04-10 LAB — HEMOGLOBIN AND HEMATOCRIT, BLOOD
HCT: 37 % — ABNORMAL LOW (ref 39.0–52.0)
HCT: 32.6 % — ABNORMAL LOW (ref 39.0–52.0)
Hemoglobin: 12 g/dL — ABNORMAL LOW (ref 13.0–17.0)
Hemoglobin: 10.8 g/dL — ABNORMAL LOW (ref 13.0–17.0)

## 2023-04-10 LAB — TSH: TSH: 1.951 u[IU]/mL (ref 0.350–4.500)

## 2023-04-10 LAB — LIPASE, BLOOD: Lipase: 26 U/L (ref 11–51)

## 2023-04-10 LAB — PROCALCITONIN: Procalcitonin: <0.1 ng/mL

## 2023-04-10 MED ORDER — LABETALOL HCL 200 MG PO TABS
300.0000 mg | ORAL_TABLET | Freq: Two times a day (BID) | ORAL | Status: DC
  Administered 2023-04-10 – 2023-04-12 (×5): 300 mg via ORAL
  Filled 2023-04-10 (×5): qty 2

## 2023-04-10 MED ORDER — BISACODYL 10 MG RE SUPP
10.0000 mg | Freq: Once | RECTAL | Status: AC
  Administered 2023-04-10: 10 mg via RECTAL
  Filled 2023-04-10: qty 1

## 2023-04-10 MED ORDER — POTASSIUM CHLORIDE 10 MEQ/100ML IV SOLN
10.0000 meq | INTRAVENOUS | Status: AC
  Administered 2023-04-10 (×4): 10 meq via INTRAVENOUS
  Filled 2023-04-10 (×4): qty 100

## 2023-04-10 MED ORDER — SODIUM CHLORIDE 0.9% FLUSH
3.0000 mL | Freq: Two times a day (BID) | INTRAVENOUS | Status: DC
  Administered 2023-04-10 – 2023-04-11 (×3): 3 mL via INTRAVENOUS

## 2023-04-10 MED ORDER — CALCIUM GLUCONATE-NACL 1-0.675 GM/50ML-% IV SOLN
1.0000 g | Freq: Once | INTRAVENOUS | Status: AC
  Administered 2023-04-10: 1000 mg via INTRAVENOUS
  Filled 2023-04-10: qty 50

## 2023-04-10 MED ORDER — LIDOCAINE VISCOUS HCL 2 % MT SOLN
15.0000 mL | Freq: Once | OROMUCOSAL | Status: AC
  Administered 2023-04-10: 15 mL via ORAL
  Filled 2023-04-10: qty 15

## 2023-04-10 MED ORDER — ALUM & MAG HYDROXIDE-SIMETH 200-200-20 MG/5ML PO SUSP
30.0000 mL | Freq: Once | ORAL | Status: AC
  Administered 2023-04-10: 30 mL via ORAL
  Filled 2023-04-10: qty 30

## 2023-04-10 MED ORDER — ONDANSETRON HCL 4 MG/2ML IJ SOLN: 4.0000 mg | Freq: Four times a day (QID) | INTRAMUSCULAR | Status: DC | PRN

## 2023-04-10 MED ORDER — ALBUTEROL SULFATE (2.5 MG/3ML) 0.083% IN NEBU: 2.5000 mg | INHALATION_SOLUTION | Freq: Four times a day (QID) | RESPIRATORY_TRACT | Status: DC | PRN

## 2023-04-10 MED ORDER — POTASSIUM CHLORIDE 10 MEQ/100ML IV SOLN: 10.0000 meq | INTRAVENOUS | Status: DC

## 2023-04-10 MED ORDER — IPRATROPIUM-ALBUTEROL 0.5-2.5 (3) MG/3ML IN SOLN
3.0000 mL | Freq: Once | RESPIRATORY_TRACT | Status: AC
  Administered 2023-04-10: 3 mL via RESPIRATORY_TRACT
  Filled 2023-04-10: qty 3

## 2023-04-10 MED ORDER — SODIUM CHLORIDE 0.9 % IV SOLN: INTRAVENOUS | Status: DC

## 2023-04-10 MED ORDER — SODIUM CHLORIDE 0.9 % IV SOLN
2.0000 g | INTRAVENOUS | Status: DC
  Administered 2023-04-10 – 2023-04-11 (×2): 2 g via INTRAVENOUS
  Filled 2023-04-10 (×2): qty 20

## 2023-04-10 MED ORDER — LACTATED RINGERS IV BOLUS
1000.0000 mL | Freq: Once | INTRAVENOUS | Status: AC
  Administered 2023-04-10: 1000 mL via INTRAVENOUS

## 2023-04-10 MED ORDER — LORAZEPAM 0.5 MG PO TABS
0.5000 mg | ORAL_TABLET | Freq: Every day | ORAL | Status: DC
  Administered 2023-04-10 – 2023-04-11 (×2): 0.5 mg via ORAL
  Filled 2023-04-10 (×2): qty 1

## 2023-04-10 MED ORDER — ACETAMINOPHEN 650 MG RE SUPP: 650.0000 mg | Freq: Four times a day (QID) | RECTAL | Status: DC | PRN

## 2023-04-10 MED ORDER — ACETAMINOPHEN 325 MG PO TABS
650.0000 mg | ORAL_TABLET | Freq: Four times a day (QID) | ORAL | Status: DC | PRN
  Filled 2023-04-10: qty 2

## 2023-04-10 MED ORDER — POTASSIUM CHLORIDE IN NACL 20-0.9 MEQ/L-% IV SOLN
INTRAVENOUS | Status: DC
  Filled 2023-04-10: qty 1000

## 2023-04-10 MED ORDER — PANTOPRAZOLE SODIUM 40 MG IV SOLR
40.0000 mg | Freq: Two times a day (BID) | INTRAVENOUS | Status: DC
  Administered 2023-04-10 – 2023-04-11 (×3): 40 mg via INTRAVENOUS
  Filled 2023-04-10 (×3): qty 10

## 2023-04-10 MED ORDER — CLONIDINE HCL 0.1 MG PO TABS
0.1000 mg | ORAL_TABLET | Freq: Every day | ORAL | Status: DC
  Administered 2023-04-10 – 2023-04-11 (×2): 0.1 mg via ORAL
  Filled 2023-04-10 (×2): qty 1

## 2023-04-10 MED ORDER — IOHEXOL 350 MG/ML SOLN
75.0000 mL | Freq: Once | INTRAVENOUS | Status: AC | PRN
  Administered 2023-04-10: 75 mL via INTRAVENOUS

## 2023-04-10 MED ORDER — ONDANSETRON HCL 4 MG PO TABS: 4.0000 mg | ORAL_TABLET | Freq: Four times a day (QID) | ORAL | Status: DC | PRN

## 2023-04-10 MED ORDER — PANTOPRAZOLE SODIUM 40 MG IV SOLR
40.0000 mg | Freq: Once | INTRAVENOUS | Status: AC
  Administered 2023-04-10: 40 mg via INTRAVENOUS
  Filled 2023-04-10: qty 10

## 2023-04-10 NOTE — ED Notes (Signed)
ED TO INPATIENT HANDOFF REPORT  ED Nurse Name and Phone #: [phone]  S Name/Age/Gender [name] 77 y.o. male Room/Bed: 028C/028C  Code Status   Code Status: Prior  Home/SNF/Other Home Patient oriented to: self, place, time, and situation Is this baseline? Yes   Triage Complete: Triage complete  Chief Complaint Hematemesis [K92.0]  Triage Note Patient reports n/v x 4 days.  Patient reports vomit is "black."  Patient also endorses no bowel movement since last Tuesday. Patient c/o right flank pain.    Allergies Allergies  Allergen Reactions   Latex Rash and Other (See Comments)    Skin gets "blood raw and turns into sores"  Band Aid will tear off his skin   Codeine Other (See Comments)    Severe Headaches    Zolpidem Tartrate Other (See Comments)    Hallucinations   Hct [Hydrochlorothiazide] Other (See Comments)    Hypercalcemia    Metformin And Related Diarrhea and Other (See Comments)    Cannot tolerate in higher doses- has to drop from 2,000 mg daily to 1,500 mg daily to STOP the diarrhea that was occurring   Naproxen Diarrhea   Prednisone Diarrhea   Adhesive [Tape] Rash and Other (See Comments)    Redness, also (thinks he can tolerate paper tape)   Penicillins Rash and Other (See Comments)    Welts Has patient had a PCN reaction causing immediate rash, facial/tongue/throat swelling, SOB or lightheadedness with hypotension: Yes Has patient had a PCN reaction causing severe rash involving mucus membranes or skin necrosis: No Has patient had a PCN reaction that required hospitalization: No Has patient had a PCN reaction occurring within the last 10 years: No If all of the above answers are "NO", then may proceed with Cephalosporin use.     Level of Care/Admitting Diagnosis ED Disposition     ED Disposition  Admit   Condition  --   Comment  Hospital Area: [facility] [[id number]]  Level of Care: Telemetry Medical [104]  May place  patient in observation at [facility] or [facility] if equivalent level of care is available:: No  Covid Evaluation: Asymptomatic - no recent exposure (last 10 days) testing not required  Diagnosis: Hematemesis [578.0.ICD-9-CM]  Admitting Physician: Clydie Braun [[id number]]  Attending Physician: Clydie Braun [[id number]]          B Medical/Surgery History Past Medical History:  Diagnosis Date   Abnormality of gait 03/31/2013   ALLERGIC RHINITIS 11/06/2007   Anemia, unspecified 08/22/2012   BENIGN PROSTATIC HYPERTROPHY 07/14/2007   CAD (coronary artery disease) 08/01/2022   Cardiomyopathy (HCC) 06/11/2022   Cervical spondylosis with radiculopathy 06/11/2022   Chronic osteomyelitis of toe of left foot (HCC) 09/12/2017   Chronic right shoulder pain 06/29/2022   COLONIC POLYPS, HX OF 07/14/2007   Complication of anesthesia    pt states he needs to be cathed after surgery   COVID-19    Degenerative arthritis of right knee 12/12/2011   Diabetes (HCC) 02/03/2013   Diet controlled   Diabetes mellitus due to underlying condition with unspecified complications (HCC) 06/11/2022   Disequilibrium 03/31/2013   Dyspnea 08/31/2019   Encounter for well adult exam with abnormal findings 12/10/2011   Essential hypertension 07/11/2007   Qualifier: Diagnosis of  By: Maris Berger    Former smoker 02/08/2015   GERD 07/11/2007   Hypercalcemia 08/12/2015   Hyperlipidemia    Hyperthyroidism    Hypervitaminosis D 01/14/2019   Hypothyroidism  Hypoxia    Lacunar infarction (HCC) 03/31/2013   Microalbuminuria 08/31/2019   Neck pain 03/26/2022   Neural foraminal stenosis of cervical spine 06/11/2022   OSTEOARTHRITIS, KNEE, RIGHT 10/27/2008   PERIPHERAL EDEMA 05/31/2009   Polyarthralgia 09/12/2017   Primary osteoarthritis of right shoulder 06/29/2022   PULMONARY NODULE 06/24/2008   granuloma   Recurrent falls 08/17/2016   Right shoulder pain 08/17/2016   Rotator cuff  arthropathy 09/07/2016   Injected 09/07/2016 Repeat injection 01/07/17 Repeat injection April 20th 2018. Repeat injection 05/22/2017 Repeat injection 08/15/2017   Skin lesion 02/19/2017   Spinal stenosis in cervical region 03/31/2013   Spondylosis, cervical, with myelopathy 11/11/2012   TIA (transient ischemic attack) 08/13/2015   Upper back pain on right side 08/31/2019   Vitamin D deficiency 08/12/2015   Weight loss 08/31/2019   Past Surgical History:  Procedure Laterality Date   ANTERIOR CERVICAL DECOMP/DISCECTOMY FUSION N/A 01/06/2013   Procedure: ANTERIOR CERVICAL DECOMPRESSION/DISCECTOMY FUSION 1 LEVEL;  Surgeon: Maeola Harman, MD;  Location: [facility];  Service: Neurosurgery;  Laterality: N/A;  Cervical three-four Anterior cervical decompression/diskectomy/fusion   BACK SURGERY  1986   x 2  1986 C 3   HERNIA REPAIR  sept 1986   left knee replacement  Nov 21, 2007   NECK SURGERY     NISSEN FUNDOPLICATION  2000   right heel bone spur  1987   TOTAL KNEE ARTHROPLASTY  01/09/2012   Procedure: TOTAL KNEE ARTHROPLASTY;  Surgeon: Jacki Cones, MD;  Location: [facility];  Service: Orthopedics;  Laterality: Right;     A IV Location/Drains/Wounds Patient Lines/Drains/Airways Status     Active Line/Drains/Airways     Name Placement date Placement time Site Days   Peripheral IV 04/10/23 20 G Right Antecubital 04/10/23  0402  Antecubital  less than 1   Incision 01/09/12 Knee Right 01/09/12  0912  -- 4109   Incision 01/06/13 Neck Bilateral 01/06/13  0851  -- 3746            Intake/Output Last 24 hours  Intake/Output Summary (Last 24 hours) at 04/10/2023 0820 Last data filed at 04/10/2023 0641 Gross per 24 hour  Intake 1000 ml  Output --  Net 1000 ml    Labs/Imaging Results for orders placed or performed during the hospital encounter of 04/09/23 (from the past 48 hour(s))  Comprehensive metabolic panel     Status: Abnormal   Collection Time: 04/09/23 10:29 PM  Result Value  Ref Range   Sodium 138 135 - 145 mmol/L   Potassium 3.1 (L) 3.5 - 5.1 mmol/L   Chloride 98 98 - 111 mmol/L   CO2 24 22 - 32 mmol/L   Glucose, Bld 126 (H) 70 - 99 mg/dL    Comment: Glucose reference range applies only to samples taken after fasting for at least 8 hours.   BUN 17 8 - 23 mg/dL   Creatinine, Ser 3.24 0.61 - 1.24 mg/dL   Calcium 9.3 8.9 - 40.1 mg/dL   Total Protein 6.9 6.5 - 8.1 g/dL   Albumin 3.2 (L) 3.5 - 5.0 g/dL   AST 27 15 - 41 U/L   ALT 23 0 - 44 U/L   Alkaline Phosphatase 48 38 - 126 U/L   Total Bilirubin 1.4 (H) 0.3 - 1.2 mg/dL   GFR, Estimated >02 >72 mL/min    Comment: (NOTE) Calculated using the CKD-EPI Creatinine Equation (2021)    Anion gap 16 (H) 5 - 15    Comment: Performed at  [facility], [address] [place], [place] [address]  CBC     Status: None   Collection Time: 04/09/23 10:29 PM  Result Value Ref Range   WBC 10.2 4.0 - 10.5 K/uL   RBC 4.53 4.22 - 5.81 MIL/uL   Hemoglobin 13.7 13.0 - 17.0 g/dL   HCT 60.4 54.0 - 98.1 %   MCV 92.7 80.0 - 100.0 fL   MCH 30.2 26.0 - 34.0 pg   MCHC 32.6 30.0 - 36.0 g/dL   RDW 19.1 47.8 - 29.5 %   Platelets 341 150 - 400 K/uL   nRBC 0.0 0.0 - 0.2 %    Comment: Performed at [facility], [address] [place], [place] [address]  Type and screen [facility]     Status: None   Collection Time: 04/09/23 10:29 PM  Result Value Ref Range   ABO/RH(D) O POS    Antibody Screen NEG    Sample Expiration      04/12/2023,2359 Performed at [facility], [address], [place], [place] [address]   Lipase, blood     Status: None   Collection Time: 04/10/23  4:04 AM  Result Value Ref Range   Lipase 26 11 - 51 U/L    Comment: Performed at [facility], [address], [place], [place] [address]   CT ABDOMEN PELVIS W CONTRAST  Result Date: 04/10/2023 CLINICAL DATA:  77 year old male with epigastric pain, nausea vomiting for 4 days. Right flank pain and no recent bowel  movement. EXAM: CT ABDOMEN AND PELVIS WITH CONTRAST TECHNIQUE: Multidetector CT imaging of the abdomen and pelvis was performed using the standard protocol following bolus administration of intravenous contrast. RADIATION DOSE REDUCTION: This exam was performed according to the departmental dose-optimization program which includes automated exposure control, adjustment of the mA and/or kV according to patient size and/or use of iterative reconstruction technique. CONTRAST:  75mL OMNIPAQUE IOHEXOL 350 MG/ML SOLN COMPARISON:  [facility] chest CTA 08/21/2021 and CTA abdomen and runoff 10/15/2017. FINDINGS: Lower chest: Emphysema demonstrated on the 2022 CT with upper lobe predominance and heterogeneous peribronchial opacity at the lung bases today most resembles postinflammatory scarring, with occasional small calcified lung nodules. No pleural effusion. Heart size within normal limits. No pericardial effusion. Hepatobiliary: Gallbladder mildly distended but does not appear inflamed and no sludge or stones are evident. No biliary ductal dilatation. Liver enhancement within normal limits. Pancreas: Partially atrophied but no active inflammation. Mild dystrophic calcifications, no main pancreatic ductal dilatation. Spleen: Negative.  Punctate calcified granuloma. Adrenals/Urinary Tract: Negative adrenal glands. Nonobstructed kidneys with symmetric renal enhancement and contrast excretion. Decompressed ureters. Unremarkable bladder. Stomach/Bowel: Redundant sigmoid colon with mild diverticulosis, but the sigmoid is fairly decompressed. There is mild retained stool in the rectum. Upstream moderate retained stool in redundant transverse colon and colonic flexures. Terminal ileum is decompressed. No large bowel inflammation is identified and decompressed normal appendix is suspected on coronal image 77. No dilated small bowel. No free air, free fluid, or mesenteric inflammation identified. Chronic mild to moderate  hiatal hernia. And there is circumferential wall thickening of the visible distal esophagus (series 3, image 5). Small chronic surgical clips along the anterior gastric hernia. Intra-abdominal stomach and duodenum are decompressed. There is a small 2.3 cm diverticulum at the ligament of Treitz which is chronic (series 3, image 37) with no adjacent inflammation. Vascular/Lymphatic: Extensive Aortoiliac calcified atherosclerosis. Major arterial structures in the abdomen and pelvis remain patent.  Normal caliber abdominal aorta. Portal venous system appears patent. No lymphadenopathy identified. Reproductive: Mild prostatomegaly.  Pelvic phleboliths. Other: No pelvis free fluid. Musculoskeletal: Multilevel spine degeneration. No acute or suspicious osseous lesion. IMPRESSION: 1. Distal esophageal wall thickening compatible with a degree of Esophagitis., Superimposed on moderate chronic gastric hiatal hernia. 2. But decompressed stomach. No gastro duodenal inflammation is evident. Small chronic distal duodenal diverticulum. 3. No other acute or inflammatory process identified in the abdomen or pelvis. Redundant large bowel with retained stool. Aortic Atherosclerosis (ICD10-I70.0). Chronic postinflammatory scarring suspected at both lung bases. Electronically Signed   By: Odessa Fleming M.D.   On: 04/10/2023 05:36   DG Chest 2 View  Result Date: 04/10/2023 CLINICAL DATA:  Concern for pneumonia EXAM: CHEST - 2 VIEW COMPARISON:  12/22/2020 FINDINGS: Heart and mediastinal contours within normal limits. Interstitial prominence throughout the lungs, right greater than left, similar to prior study. This could reflect chronic lung disease. No visible effusions or acute bony abnormality. IMPRESSION: Interstitial prominence within the lungs, slightly greater on the right. Favor chronic lung disease. Electronically Signed   By: Charlett Nose M.D.   On: 04/10/2023 03:39    Pending Labs Unresulted Labs (From admission, onward)      Start     Ordered   04/10/23 0741  Occult bld gastric/duodenum (cup to lab)  Once,   R        04/10/23 0744            Vitals/Pain Today's Vitals   04/10/23 0600 04/10/23 0630 04/10/23 0700 04/10/23 0730  BP: (!) 155/74 (!) 151/74 (!) 143/76 138/75  Pulse: 70 72 80 82  Resp:      Temp:      TempSrc:      SpO2: 99% 92% 94% 92%  Weight:      Height:      PainSc:        Isolation Precautions No active isolations  Medications Medications  lactated ringers bolus 1,000 mL (0 mLs Intravenous Stopped 04/10/23 0641)  pantoprazole (PROTONIX) injection 40 mg (40 mg Intravenous Given 04/10/23 0407)  alum & mag hydroxide-simeth (MAALOX/MYLANTA) 200-200-20 MG/5ML suspension 30 mL (30 mLs Oral Given 04/10/23 0412)    And  lidocaine (XYLOCAINE) 2 % viscous mouth solution 15 mL (15 mLs Oral Given 04/10/23 0413)  ipratropium-albuterol (DUONEB) 0.5-2.5 (3) MG/3ML nebulizer solution 3 mL (3 mLs Nebulization Given 04/10/23 0413)  iohexol (OMNIPAQUE) 350 MG/ML injection 75 mL (75 mLs Intravenous Contrast Given 04/10/23 0419)    Mobility walks     Focused Assessments GI   R Recommendations: See Admitting Provider Note  Report given to:   Additional Notes:

## 2023-04-10 NOTE — ED Provider Notes (Signed)
[place] EMERGENCY DEPARTMENT AT [facility] Provider Note  CSN: [id number] Arrival date & time: 04/09/23 2149  Chief Complaint(s) Hematemesis  HPI [name] is a 77 y.o. male with PMH peptic ulcer disease starting in his 88s, osteomyelitis of his left foot, Nissen fundoplication for GERD, CAD, T2DM, HTN, previous CVA who presents emergency department for evaluation of hematemesis and constipation.  Patient states that over the last 4 days he has had multiple episodes of black vomitus.  He states that the vomit is the consistency of a "syrup".  He does Plavix and has been compliant with his medication.  In addition, patient states he has not had a bowel movement for over a week and is feeling abdominal distention and pain.  Patient also endorses a 50 pound weight loss over the course of this year with intermittent night sweats and occasional dysphagia where he feels that food sometimes gets stuck in his esophagus.  He denies current chest pain, shortness of breath, headache, fever or other systemic symptoms.  He states that he previously was following with [facility] but his gastroenterologist retired and has not seen them for multiple years.   Past Medical History Past Medical History:  Diagnosis Date   Abnormality of gait 03/31/2013   ALLERGIC RHINITIS 11/06/2007   Anemia, unspecified 08/22/2012   BENIGN PROSTATIC HYPERTROPHY 07/14/2007   CAD (coronary artery disease) 08/01/2022   Cardiomyopathy (HCC) 06/11/2022   Cervical spondylosis with radiculopathy 06/11/2022   Chronic osteomyelitis of toe of left foot (HCC) 09/12/2017   Chronic right shoulder pain 06/29/2022   COLONIC POLYPS, HX OF 07/14/2007   Complication of anesthesia    pt states he needs to be cathed after surgery   COVID-19    Degenerative arthritis of right knee 12/12/2011   Diabetes (HCC) 02/03/2013   Diet controlled   Diabetes mellitus due to underlying condition with unspecified  complications (HCC) 06/11/2022   Disequilibrium 03/31/2013   Dyspnea 08/31/2019   Encounter for well adult exam with abnormal findings 12/10/2011   Essential hypertension 07/11/2007   Qualifier: Diagnosis of  By: Maris Berger    Former smoker 02/08/2015   GERD 07/11/2007   Hypercalcemia 08/12/2015   Hyperlipidemia    Hyperthyroidism    Hypervitaminosis D 01/14/2019   Hypothyroidism    Hypoxia    Lacunar infarction (HCC) 03/31/2013   Microalbuminuria 08/31/2019   Neck pain 03/26/2022   Neural foraminal stenosis of cervical spine 06/11/2022   OSTEOARTHRITIS, KNEE, RIGHT 10/27/2008   PERIPHERAL EDEMA 05/31/2009   Polyarthralgia 09/12/2017   Primary osteoarthritis of right shoulder 06/29/2022   PULMONARY NODULE 06/24/2008   granuloma   Recurrent falls 08/17/2016   Right shoulder pain 08/17/2016   Rotator cuff arthropathy 09/07/2016   Injected 09/07/2016 Repeat injection 01/07/17 Repeat injection April 20th 2018. Repeat injection 05/22/2017 Repeat injection 08/15/2017   Skin lesion 02/19/2017   Spinal stenosis in cervical region 03/31/2013   Spondylosis, cervical, with myelopathy 11/11/2012   TIA (transient ischemic attack) 08/13/2015   Upper back pain on right side 08/31/2019   Vitamin D deficiency 08/12/2015   Weight loss 08/31/2019   Patient Active Problem List   Diagnosis Date Noted   CAD (coronary artery disease) 08/01/2022   Chronic right shoulder pain 06/29/2022   Primary osteoarthritis of right shoulder 06/29/2022   Diabetes mellitus due to underlying condition with unspecified complications (HCC) 06/11/2022   Cardiomyopathy (HCC) 06/11/2022   Neural foraminal stenosis of cervical spine 06/11/2022   Cervical spondylosis with  radiculopathy 06/11/2022   Complication of anesthesia 05/10/2022   Neck pain 03/26/2022   COVID-19 12/22/2020   Hypoxia    Microalbuminuria 08/31/2019   Dyspnea 08/31/2019   Upper back pain on right side 08/31/2019   Weight loss  08/31/2019   Hypervitaminosis D 01/14/2019   Polyarthralgia 09/12/2017   Chronic osteomyelitis of toe of left foot (HCC) 09/12/2017   Skin lesion 02/19/2017   Rotator cuff arthropathy 09/07/2016   Recurrent falls 08/17/2016   Right shoulder pain 08/17/2016   TIA (transient ischemic attack) 08/13/2015   Hypercalcemia 08/12/2015   Vitamin D deficiency 08/12/2015   Former smoker 02/08/2015   Spinal stenosis in cervical region 03/31/2013   Disequilibrium 03/31/2013   Lacunar infarction (HCC) 03/31/2013   Abnormality of gait 03/31/2013   Diabetes (HCC) 02/03/2013   Spondylosis, cervical, with myelopathy 11/11/2012   Anemia, unspecified 08/22/2012   Degenerative arthritis of right knee 12/12/2011   Encounter for well adult exam with abnormal findings 12/10/2011   PERIPHERAL EDEMA 05/31/2009   OSTEOARTHRITIS, KNEE, RIGHT 10/27/2008   PULMONARY NODULE 06/24/2008   Hypothyroidism 11/06/2007   ALLERGIC RHINITIS 11/06/2007   BENIGN PROSTATIC HYPERTROPHY 07/14/2007   COLONIC POLYPS, HX OF 07/14/2007   Hyperthyroidism 07/14/2007   Hyperlipidemia 07/11/2007   Essential hypertension 07/11/2007   GERD 07/11/2007   Home Medication(s) Prior to Admission medications   Medication Sig Start Date End Date Taking? Authorizing Provider  ACCU-CHEK GUIDE test strip USE AS INSTRUCTED ONCE  DAILY 02/27/22   Corwin Levins, MD  Accu-Chek Softclix Lancets lancets USE AS DIRECTED ONCE DAILY 01/06/22   Corwin Levins, MD  albuterol (VENTOLIN HFA) 108 (90 Base) MCG/ACT inhaler Inhale 1-2 puffs into the lungs every 6 (six) hours as needed for wheezing or shortness of breath.    [provider]  aspirin EC 325 MG tablet Take 1 tablet (325 mg total) by mouth once as needed (when stroke-like symptoms present). 02/26/19   Corwin Levins, MD  atorvastatin (LIPITOR) 80 MG tablet Take 1 tablet (80 mg total) by mouth daily. 04/05/21   Corwin Levins, MD  Blood Glucose Monitoring Suppl (ACCU-CHEK GUIDE) w/Device KIT 1  Device by Does not apply route daily. Once daily E11.9 02/29/20   Corwin Levins, MD  budesonide-formoterol [facility]) 160-4.5 MCG/ACT inhaler Inhale 2 puffs into the lungs 2 (two) times daily.    [provider]  cetirizine (ZYRTEC) 10 MG tablet Take 1 tablet (10 mg total) by mouth daily. 08/31/19   Corwin Levins, MD  cloNIDine (CATAPRES) 0.1 MG tablet Take 1 tablet (0.1 mg total) by mouth daily. 11/16/22   Revankar, Aundra Dubin, MD  clopidogrel (PLAVIX) 75 MG tablet Take 1 tablet (75 mg total) by mouth daily. 04/05/21   Corwin Levins, MD  dapagliflozin propanediol (FARXIGA) 10 MG TABS tablet Take 1 tablet (10 mg total) by mouth daily before breakfast. 02/15/23   Revankar, Aundra Dubin, MD  famotidine (PEPCID) 40 MG tablet Take 40 mg by mouth daily. 03/05/22   [provider]  fenofibrate 160 MG tablet TAKE 1 TABLET BY MOUTH  DAILY 01/10/21   Corwin Levins, MD  fluticasone [facility]) 50 MCG/ACT nasal spray Place 1 spray into both nostrils as needed for allergies or rhinitis. 12/28/21   [provider]  gabapentin (NEURONTIN) 400 MG capsule TAKE 1 CAPSULE BY MOUTH 3  TIMES DAILY 09/22/20   Corwin Levins, MD  glucose blood test strip 1 each by Other route 2 (two) times daily.  Use to check blood sugars twice a day Dx E11.9 03/14/15   Corwin Levins, MD  Ipratropium-Albuterol (COMBIVENT RESPIMAT IN) Inhale 1-2 puffs into the lungs daily.    [provider]  labetalol (NORMODYNE) 300 MG tablet TAKE 1 TABLET BY MOUTH  TWICE DAILY 01/25/21   Corwin Levins, MD  levothyroxine (SYNTHROID) 100 MCG tablet Take 100 mcg by mouth daily before breakfast. 08/09/22   [provider]  LORazepam (ATIVAN) 1 MG tablet Take 0.5 tablets by mouth at bedtime. 06/21/22   [provider]  metFORMIN (GLUCOPHAGE-XR) 500 MG 24 hr tablet TAKE 4 TABLETS BY MOUTH  DAILY WITH BREAKFAST 07/26/20   Corwin Levins, MD  montelukast (SINGULAIR) 10 MG tablet Take 10 mg by mouth daily. 03/01/22   [provider]  MYRBETRIQ 25 MG TB24 tablet Take 25 mg by mouth daily. 12/24/22   [provider]  nitroGLYCERIN (NITROSTAT) 0.4 MG SL tablet Place 0.4 mg under the tongue every 5 (five) minutes as needed for chest pain.    [provider]  sacubitril-valsartan (ENTRESTO) 97-103 MG Take 1 tablet by mouth 2 (two) times daily. 10/31/22   Revankar, Aundra Dubin, MD  spironolactone (ALDACTONE) 25 MG tablet Take 0.5 tablets (12.5 mg total) by mouth daily. 01/30/23   Revankar, Aundra Dubin, MD                                                                                                                                    Past Surgical History Past Surgical History:  Procedure Laterality Date   ANTERIOR CERVICAL DECOMP/DISCECTOMY FUSION N/A 01/06/2013   Procedure: ANTERIOR CERVICAL DECOMPRESSION/DISCECTOMY FUSION 1 LEVEL;  Surgeon: Maeola Harman, MD;  Location: [facility];  Service: Neurosurgery;  Laterality: N/A;  Cervical three-four Anterior cervical decompression/diskectomy/fusion   BACK SURGERY  1986   x 2  1986 C 3   HERNIA REPAIR  sept 1986   left knee replacement  Nov 21, 2007   NECK SURGERY     NISSEN FUNDOPLICATION  2000   right heel bone spur  1987   TOTAL KNEE ARTHROPLASTY  01/09/2012   Procedure: TOTAL KNEE ARTHROPLASTY;  Surgeon: Jacki Cones, MD;  Location: [facility];  Service: Orthopedics;  Laterality: Right;   Family History Family History  Problem Relation Age of Onset   Heart attack Mother    Heart attack Father    Emphysema Father    Heart disease Other    Diabetes Neg Hx    Thyroid disease Neg Hx    Hypercalcemia Neg Hx     Social History Social History   Tobacco Use   Smoking status: Former    Packs/day: 3.00    Years: 30.00    Additional pack years: 0.00    Total pack years: 90.00    Types: Cigarettes    Quit date: 11/26/1982    Years since quitting: 40.3  Smokeless tobacco: Never  Substance Use Topics   Alcohol use: No   Drug use: No    Allergies Latex, Zolpidem tartrate, Codeine, Hct [hydrochlorothiazide], Metformin and related, Naproxen, Prednisone, Zolpidem tartrate, Adhesive [tape], and Penicillins  Review of Systems Review of Systems  Constitutional:  Positive for diaphoresis and unexpected weight change.  Gastrointestinal:  Positive for abdominal distention and constipation.       Hematemesis    Physical Exam Vital Signs  I have reviewed the triage vital signs BP 127/87 (BP Location: Left Arm)   Pulse 93   Temp 98.4 F (36.9 C) (Oral)   Resp (!) 22   Ht 5\' 11"  (1.803 m)   Wt 79.4 kg   SpO2 96%   BMI 24.41 kg/m   Physical Exam Constitutional:      General: He is not in acute distress.    Appearance: Normal appearance.  HENT:     Head: Normocephalic and atraumatic.     Nose: No congestion or rhinorrhea.  Eyes:     General:        Right eye: No discharge.        Left eye: No discharge.     Extraocular Movements: Extraocular movements intact.     Pupils: Pupils are equal, round, and reactive to light.  Cardiovascular:     Rate and Rhythm: Normal rate and regular rhythm.     Heart sounds: No murmur heard. Pulmonary:     Effort: No respiratory distress.     Breath sounds: No wheezing or rales.  Abdominal:     General: There is distension.     Tenderness: There is abdominal tenderness.  Musculoskeletal:        General: Normal range of motion.     Cervical back: Normal range of motion.  Skin:    General: Skin is warm and dry.  Neurological:     General: No focal deficit present.     Mental Status: He is alert.     ED Results and Treatments Labs (all labs ordered are listed, but only abnormal results are displayed) Labs Reviewed  COMPREHENSIVE METABOLIC PANEL - Abnormal; Notable for the following components:      Result Value   Potassium 3.1 (*)    Glucose, Bld 126 (*)    Albumin 3.2 (*)    Total Bilirubin 1.4 (*)    Anion gap 16 (*)    All other components within normal limits   CBC  POC OCCULT BLOOD, ED  TYPE AND SCREEN                                                                                                                          Radiology No results found.  Pertinent labs & imaging results that were available during my care of the patient were reviewed by me and considered in my medical decision making (see MDM for details).  Medications Ordered in ED Medications  lactated ringers bolus 1,000 mL (has  no administration in time range)                                                                                                                                     Procedures Procedures  (including critical care time)  Medical Decision Making / ED Course   This patient presents to the ED for concern of hematemesis, constipation, this involves an extensive number of treatment options, and is a complaint that carries with it a high risk of complications and morbidity.  The differential diagnosis includes upper GI bleed, esophageal malignancy, Barrett's esophagus, peptic ulcer disease, gastric AVM, bowel obstruction, constipation, coagulopathy  MDM: Patient seen emergency room for evaluation of multiple complaints described above.  Physical exam with some mild abdominal distention and generalized tenderness to palpation but is otherwise unremarkable.  Laboratory evaluation with an initial hemoglobin of 13.7, mild hypokalemia to 3.1, hypoalbuminemia to 3.2 but is otherwise unremarkable.  Initial chest x-ray with some interstitial prominence likely chronic lung disease, CT abdomen pelvis with distal esophageal wall thickening and a gastric hiatal hernia, redundant large bowel with retained stool.  Patient started on pantoprazole and I did speak with the gastroenterologist on-call for [place] who will have the inpatient team evaluate the patient inpatient.  Patient likely will require an EGD.  Patient then admitted to the hospitalist.   Additional history  obtained: -Additional history obtained from multiple family members -External records from outside source obtained and reviewed including: Chart review including previous notes, labs, imaging, consultation notes   Lab Tests: -I ordered, reviewed, and interpreted labs.   The pertinent results include:   Labs Reviewed  COMPREHENSIVE METABOLIC PANEL - Abnormal; Notable for the following components:      Result Value   Potassium 3.1 (*)    Glucose, Bld 126 (*)    Albumin 3.2 (*)    Total Bilirubin 1.4 (*)    Anion gap 16 (*)    All other components within normal limits  CBC  POC OCCULT BLOOD, ED  TYPE AND SCREEN      EKG   EKG Interpretation  Date/Time:  Tuesday Apr 09 2023 22:00:16 EDT Ventricular Rate:  84 PR Interval:  148 QRS Duration: 164 QT Interval:  476 QTC Calculation: 562 R Axis:   44 Text Interpretation: Normal sinus rhythm Left bundle branch block Abnormal ECG No significant change since last tracing Confirmed by Melene Plan [phone]) on 04/10/2023 7:02:25 AM         Imaging Studies ordered: I ordered imaging studies including chest x-ray, CT abdomen pelvis I independently visualized and interpreted imaging. I agree with the radiologist interpretation   Medicines ordered and prescription drug management: Meds ordered this encounter  Medications   lactated ringers bolus 1,000 mL    -I have reviewed the patients home medicines and have made adjustments as needed  Critical interventions none  Consultations Obtained: I requested consultation with the [place]  gastroenterologist on-call,  and discussed lab and imaging findings as well as pertinent plan - they recommend: PPI and inpatient evaluation   Cardiac Monitoring: The patient was maintained on a cardiac monitor.  I personally viewed and interpreted the cardiac monitored which showed an underlying rhythm of: NSR  Social Determinants of Health:  Factors impacting patients care include:  none   Reevaluation: After the interventions noted above, I reevaluated the patient and found that they have :stayed the same  Co morbidities that complicate the patient evaluation  Past Medical History:  Diagnosis Date   Abnormality of gait 03/31/2013   ALLERGIC RHINITIS 11/06/2007   Anemia, unspecified 08/22/2012   BENIGN PROSTATIC HYPERTROPHY 07/14/2007   CAD (coronary artery disease) 08/01/2022   Cardiomyopathy (HCC) 06/11/2022   Cervical spondylosis with radiculopathy 06/11/2022   Chronic osteomyelitis of toe of left foot (HCC) 09/12/2017   Chronic right shoulder pain 06/29/2022   COLONIC POLYPS, HX OF 07/14/2007   Complication of anesthesia    pt states he needs to be cathed after surgery   COVID-19    Degenerative arthritis of right knee 12/12/2011   Diabetes (HCC) 02/03/2013   Diet controlled   Diabetes mellitus due to underlying condition with unspecified complications (HCC) 06/11/2022   Disequilibrium 03/31/2013   Dyspnea 08/31/2019   Encounter for well adult exam with abnormal findings 12/10/2011   Essential hypertension 07/11/2007   Qualifier: Diagnosis of  By: Maris Berger    Former smoker 02/08/2015   GERD 07/11/2007   Hypercalcemia 08/12/2015   Hyperlipidemia    Hyperthyroidism    Hypervitaminosis D 01/14/2019   Hypothyroidism    Hypoxia    Lacunar infarction (HCC) 03/31/2013   Microalbuminuria 08/31/2019   Neck pain 03/26/2022   Neural foraminal stenosis of cervical spine 06/11/2022   OSTEOARTHRITIS, KNEE, RIGHT 10/27/2008   PERIPHERAL EDEMA 05/31/2009   Polyarthralgia 09/12/2017   Primary osteoarthritis of right shoulder 06/29/2022   PULMONARY NODULE 06/24/2008   granuloma   Recurrent falls 08/17/2016   Right shoulder pain 08/17/2016   Rotator cuff arthropathy 09/07/2016   Injected 09/07/2016 Repeat injection 01/07/17 Repeat injection April 20th 2018. Repeat injection 05/22/2017 Repeat injection 08/15/2017   Skin lesion 02/19/2017    Spinal stenosis in cervical region 03/31/2013   Spondylosis, cervical, with myelopathy 11/11/2012   TIA (transient ischemic attack) 08/13/2015   Upper back pain on right side 08/31/2019   Vitamin D deficiency 08/12/2015   Weight loss 08/31/2019      Dispostion: I considered admission for this patient, and due to suspected upper GI bleed versus esophageal malignancy patient require hospital admission     Final Clinical Impression(s) / ED Diagnoses Final diagnoses:  None     @PCDICTATION @    Glendora Score, MD 04/10/23 1710

## 2023-04-10 NOTE — H&P (Addendum)
History and Physical    Patient: [name]:[id number] DOB: 02/02/1946 DOA: 04/09/2023 DOS: the patient was seen and examined on 04/10/2023 PCP: Eunice Blase, PA-C  Patient coming from: Home  Chief Complaint:  Chief Complaint  Patient presents with   Hematemesis   HPI: [name] is a 77 y.o. male with medical history significant of hypertension, hyperlipidemia, heart failure with reduced EF, CAD, diet-controlled diabetes mellitus type 2, hiatal hernia s/p Nissen fundoplication in 2010,  and remote history of tobacco abuse who presents with reports of nausea and vomiting that started 6 days ago.  He reports emesis was noted to be a black syrup material.  He had initially started coughing up white stuff the day before symptoms started.  He has a history of having difficulty with pills and food intermittently getting stuck.  Patient reported stopping his full dose aspirin at that time due to his symptoms, but had continued to take Plavix up until yesterday.  He has a history of reflux that improved after having meals fundoplication, but had recurrence of symptoms starting last year.  He has been taking Pepcid 40 mg daily. Since last year he estimates that he is lost about 50 pounds.   He has had some right lower chest pains for which it hurts to take a deep inspiratory breath that started while here in the hospital, but he also has been having right shoulder pain.  He also reports that he was recently told by his pulmonologist to hold gabapentin due to it possibly being related to his symptoms.  Patient reports last episode of vomiting was yesterday.  In the emergency department patient was noted to be afebrile with relatively stable vital signs.  Labs from 5/14 significant for potassium 3.1, CO2 24, anion gap 16, albumin 3.2, total bilirubin 1.4, and lipase 26.  Chest x-ray noted interstitial prominence within the lungs slightly greater on the right favoring chronic lung disease.  CT scan  of the abdomen pelvis noted distal esophageal wall thickening compatible with the degree of esophagitis with superimposed on moderate chronic distal hiatal hernia.  Patient has been given 1 L of lactated Ringer's, GI cocktail, Protonix 40 mg IV, and GI cocktail.  Review of Systems: As mentioned in the history of present illness. All other systems reviewed and are negative. Past Medical History:  Diagnosis Date   Abnormality of gait 03/31/2013   ALLERGIC RHINITIS 11/06/2007   Anemia, unspecified 08/22/2012   BENIGN PROSTATIC HYPERTROPHY 07/14/2007   CAD (coronary artery disease) 08/01/2022   Cardiomyopathy (HCC) 06/11/2022   Cervical spondylosis with radiculopathy 06/11/2022   Chronic osteomyelitis of toe of left foot (HCC) 09/12/2017   Chronic right shoulder pain 06/29/2022   COLONIC POLYPS, HX OF 07/14/2007   Complication of anesthesia    pt states he needs to be cathed after surgery   COVID-19    Degenerative arthritis of right knee 12/12/2011   Diabetes (HCC) 02/03/2013   Diet controlled   Diabetes mellitus due to underlying condition with unspecified complications (HCC) 06/11/2022   Disequilibrium 03/31/2013   Dyspnea 08/31/2019   Encounter for well adult exam with abnormal findings 12/10/2011   Essential hypertension 07/11/2007   Qualifier: Diagnosis of  By: Maris Berger    Former smoker 02/08/2015   GERD 07/11/2007   Hypercalcemia 08/12/2015   Hyperlipidemia    Hyperthyroidism    Hypervitaminosis D 01/14/2019   Hypothyroidism    Hypoxia    Lacunar infarction (HCC) 03/31/2013   Microalbuminuria 08/31/2019  Neck pain 03/26/2022   Neural foraminal stenosis of cervical spine 06/11/2022   OSTEOARTHRITIS, KNEE, RIGHT 10/27/2008   PERIPHERAL EDEMA 05/31/2009   Polyarthralgia 09/12/2017   Primary osteoarthritis of right shoulder 06/29/2022   PULMONARY NODULE 06/24/2008   granuloma   Recurrent falls 08/17/2016   Right shoulder pain 08/17/2016   Rotator cuff  arthropathy 09/07/2016   Injected 09/07/2016 Repeat injection 01/07/17 Repeat injection April 20th 2018. Repeat injection 05/22/2017 Repeat injection 08/15/2017   Skin lesion 02/19/2017   Spinal stenosis in cervical region 03/31/2013   Spondylosis, cervical, with myelopathy 11/11/2012   TIA (transient ischemic attack) 08/13/2015   Upper back pain on right side 08/31/2019   Vitamin D deficiency 08/12/2015   Weight loss 08/31/2019   Past Surgical History:  Procedure Laterality Date   ANTERIOR CERVICAL DECOMP/DISCECTOMY FUSION N/A 01/06/2013   Procedure: ANTERIOR CERVICAL DECOMPRESSION/DISCECTOMY FUSION 1 LEVEL;  Surgeon: Maeola Harman, MD;  Location: [facility];  Service: Neurosurgery;  Laterality: N/A;  Cervical three-four Anterior cervical decompression/diskectomy/fusion   BACK SURGERY  1986   x 2  1986 C 3   HERNIA REPAIR  sept 1986   left knee replacement  Nov 21, 2007   NECK SURGERY     NISSEN FUNDOPLICATION  2000   right heel bone spur  1987   TOTAL KNEE ARTHROPLASTY  01/09/2012   Procedure: TOTAL KNEE ARTHROPLASTY;  Surgeon: Jacki Cones, MD;  Location: [facility];  Service: Orthopedics;  Laterality: Right;   Social History:  reports that he quit smoking about 40 years ago. His smoking use included cigarettes. He has a 90.00 pack-year smoking history. He has never used smokeless tobacco. He reports that he does not drink alcohol and does not use drugs.  Allergies  Allergen Reactions   Latex Rash and Other (See Comments)    Skin gets "blood raw and turns into sores"  Band Aid will tear off his skin   Codeine Other (See Comments)    Severe Headaches    Zolpidem Tartrate Other (See Comments)    Hallucinations   Hct [Hydrochlorothiazide] Other (See Comments)    Hypercalcemia    Metformin And Related Diarrhea and Other (See Comments)    Cannot tolerate in higher doses- has to drop from 2,000 mg daily to 1,500 mg daily to STOP the diarrhea that was occurring   Naproxen Diarrhea    Prednisone Diarrhea   Adhesive [Tape] Rash and Other (See Comments)    Redness, also (thinks he can tolerate paper tape)   Penicillins Rash and Other (See Comments)    Welts Has patient had a PCN reaction causing immediate rash, facial/tongue/throat swelling, SOB or lightheadedness with hypotension: Yes Has patient had a PCN reaction causing severe rash involving mucus membranes or skin necrosis: No Has patient had a PCN reaction that required hospitalization: No Has patient had a PCN reaction occurring within the last 10 years: No If all of the above answers are "NO", then may proceed with Cephalosporin use.     Family History  Problem Relation Age of Onset   Heart attack Mother    Heart attack Father    Emphysema Father    Heart disease Other    Diabetes Neg Hx    Thyroid disease Neg Hx    Hypercalcemia Neg Hx     Prior to Admission medications   Medication Sig Start Date End Date Taking? Authorizing Provider  albuterol (VENTOLIN HFA) 108 (90 Base) MCG/ACT inhaler Inhale 1-2 puffs into the lungs every  6 (six) hours as needed for wheezing or shortness of breath.   Yes [provider]  aspirin EC 325 MG tablet Take 1 tablet (325 mg total) by mouth once as needed (when stroke-like symptoms present). Patient taking differently: Take 325 mg by mouth daily. 02/26/19  Yes Corwin Levins, MD  atorvastatin (LIPITOR) 80 MG tablet Take 1 tablet (80 mg total) by mouth daily. 04/05/21  Yes Corwin Levins, MD  budesonide-formoterol [facility]) 160-4.5 MCG/ACT inhaler Inhale 2 puffs into the lungs 2 (two) times daily.   Yes [provider]  cetirizine (ZYRTEC) 10 MG tablet Take 1 tablet (10 mg total) by mouth daily. 08/31/19  Yes Corwin Levins, MD  cloNIDine (CATAPRES) 0.1 MG tablet Take 1 tablet (0.1 mg total) by mouth daily. 11/16/22  Yes Revankar, Aundra Dubin, MD  clopidogrel (PLAVIX) 75 MG tablet Take 1 tablet (75 mg total) by mouth daily. 04/05/21  Yes Corwin Levins, MD   dapagliflozin propanediol (FARXIGA) 10 MG TABS tablet Take 1 tablet (10 mg total) by mouth daily before breakfast. 02/15/23  Yes Revankar, Aundra Dubin, MD  famotidine (PEPCID) 40 MG tablet Take 40 mg by mouth daily. 03/05/22  Yes [provider]  fenofibrate 160 MG tablet TAKE 1 TABLET BY MOUTH  DAILY Patient taking differently: Take 160 mg by mouth daily. 01/10/21  Yes Corwin Levins, MD  fluticasone [facility]) 50 MCG/ACT nasal spray Place 1 spray into both nostrils as needed for allergies or rhinitis. 12/28/21  Yes [provider]  labetalol (NORMODYNE) 300 MG tablet TAKE 1 TABLET BY MOUTH  TWICE DAILY Patient taking differently: Take 300 mg by mouth 2 (two) times daily. 01/25/21  Yes Corwin Levins, MD  levothyroxine (SYNTHROID) 100 MCG tablet Take 100 mcg by mouth daily before breakfast. 08/09/22  Yes [provider]  LORazepam (ATIVAN) 1 MG tablet Take 0.5 tablets by mouth at bedtime. 06/21/22  Yes [provider]  metFORMIN (GLUCOPHAGE-XR) 500 MG 24 hr tablet TAKE 4 TABLETS BY MOUTH  DAILY WITH BREAKFAST Patient taking differently: Take 500-1,500 mg by mouth See admin instructions. 3 am 1 pm 07/26/20  Yes Corwin Levins, MD  montelukast (SINGULAIR) 10 MG tablet Take 10 mg by mouth daily. 03/01/22  Yes [provider]  MYRBETRIQ 25 MG TB24 tablet Take 25 mg by mouth daily. 12/24/22  Yes [provider]  nitroGLYCERIN (NITROSTAT) 0.4 MG SL tablet Place 0.4 mg under the tongue every 5 (five) minutes as needed for chest pain.   Yes [provider]  sacubitril-valsartan (ENTRESTO) 97-103 MG Take 1 tablet by mouth 2 (two) times daily. 10/31/22  Yes Revankar, Aundra Dubin, MD  spironolactone (ALDACTONE) 25 MG tablet Take 0.5 tablets (12.5 mg total) by mouth daily. 01/30/23  Yes Revankar, Aundra Dubin, MD  TRELEGY ELLIPTA 100-62.5-25 MCG/ACT AEPB Take 1 puff by mouth daily. 04/01/23  Yes [provider]  ACCU-CHEK GUIDE test strip USE AS INSTRUCTED ONCE  DAILY  02/27/22   Corwin Levins, MD  Accu-Chek Softclix Lancets lancets USE AS DIRECTED ONCE DAILY 01/06/22   Corwin Levins, MD  Blood Glucose Monitoring Suppl (ACCU-CHEK GUIDE) w/Device KIT 1 Device by Does not apply route daily. Once daily E11.9 02/29/20   Corwin Levins, MD  gabapentin (NEURONTIN) 400 MG capsule TAKE 1 CAPSULE BY MOUTH 3  TIMES DAILY Patient not taking: Reported on 04/10/2023 09/22/20   Corwin Levins, MD  glucose blood test strip 1 each by Other route 2 (  two) times daily. Use to check blood sugars twice a day Dx E11.9 03/14/15   Corwin Levins, MD    Physical Exam: Vitals:   04/10/23 0800 04/10/23 0830 04/10/23 0900 04/10/23 0945  BP: (!) 150/78 (!) 146/73 (!) 143/74 (!) 141/72  Pulse: 77 82 78 79  Resp:    18  Temp:    98.7 F (37.1 C)  TempSrc:      SpO2: 94% 93% 95% 91%  Weight:      Height:       Constitutional: Elderly male in NAD Eyes: PERRL, lids and conjunctivae normal ENMT: Mucous membranes are moist. Normal dentition.  Neck: normal, supple, no masses, no thyromegaly Respiratory: clear to auscultation bilaterally, no wheezing, no crackles. Normal respiratory effort. No accessory muscle use.  Cardiovascular: Regular rate and rhythm, no murmurs / rubs / gallops. No extremity edema. 2+ pedal pulses.  No tenderness to palpation on the right chest wall. Abdomen: no tenderness, no masses palpated. No hepatosplenomegaly. Bowel sounds positive.  Musculoskeletal: no clubbing / cyanosis. No joint deformity upper and lower extremities. Good ROM, no contractures. Normal muscle tone.  Skin: no rashes, lesions, ulcers. No induration Neurologic: CN 2-12 grossly intact. Sensation intact, DTR normal. Strength 5/5 in all 4.  Psychiatric: Normal judgment and insight. Alert and oriented x 3. Normal mood.   Data Reviewed:  EKG revealed normal sinus rhythm with left bundle branch block. Reviewed labs, imaging, and pertinent records as noted above.  Assessment and Plan:  Suspected  hematemesis GERD with esophagitis Hiatal hernia Acute on chronic.  Patient presented with complaints of nausea and vomiting over the last several days.  Emesis was reported to be dark in color.  He had held his aspirin couple days ago but continued to take Plavix until yesterday.  Patient has not had any subsequent episodes of vomiting since yesterday.  Hemoglobin noted to be 13.7->12.  Prior history of Nissen fundoplication back in 2010.  CT scan of the abdomen pelvis noted distal esophageal wall thickening compatible with the degree of esophagitis with superimposed on distal hiatal hernia.  With patient's reports of 50 pound weight loss question the possibility of malignancy. -Admit to a medical telemetry bed -N.p.o. -Hold aspirin and Plavix  -Pharmacy substitution of Protonix 40 mg IV twice daily   -Serial monitoring of H&H -[facility] consulted and planning for taking for EGD in a.m.  Right chest pain Acute.  Patient reported having right lower chest discomfort especially with trying to take a deep breath.  Chest x-ray had noted concern for interstitial prominence within the lung slightly greater on the right.  Question possibility of aspiration. -Aspiration precautions with elevation head of bed -Incentive spirometry and flutter valve   -Add-on prealbumin and BNP -Empiric antibiotics of Rocephin IV  Hypokalemia Acute.  Initial potassium 3.1.  Secondary to patient's nausea and vomiting symptoms -Give potassium chloride 40 meq IV -Monitor and replace as needed  Essential hypertension Blood pressures had initially been noted to be elevated up to 155/74. -Continue home blood pressure regimen once able  Heart failure with reduced EF Last echocardiogram noted EF to be 35 to 40% with grade 1 diastolic dysfunction on 02/13/2023. -Strict I&Os and daily weights  Diabetes mellitus type 2, without long-term use of insulin Glucose initially 126.  No recent hemoglobin A1c. -Hold  metformin -Continue Farxiga -Continue to monitor for need placing patient on a sliding scale of insulin  Hypothyroidism -Check TSH -Continue levothyroxine  Hyperlipidemia -Continue atorvastatin and fenofibrate  DVT prophylaxis:Heparin  Advance Care Planning:   Code Status: Full Code   Consults: PCCM  Family Communication:   Severity of Illness: The appropriate patient status for this patient is OBSERVATION. Observation status is judged to be reasonable and necessary in order to provide the required intensity of service to ensure the patient's safety. The patient's presenting symptoms, physical exam findings, and initial radiographic and laboratory data in the context of their medical condition is felt to place them at decreased risk for further clinical deterioration. Furthermore, it is anticipated that the patient will be medically stable for discharge from the hospital within 2 midnights of admission.   Author: Clydie Braun, MD 04/10/2023 9:49 AM  For on call review [email or web address].

## 2023-04-10 NOTE — Consult Note (Addendum)
Referring Provider: ? Primary Care Physician:  Eunice Blase, PA-C Primary Gastroenterologist:  Dr. Juanda Chance  Reason for Consultation:  Dysphagia and hematemesis  HPI: [name] is a 77 y.o. male with past medical history of vascular disease including coronary artery disease and carotid vascular disease with history of TIA several years ago.  Is on Plavix for these issues.  Also has COPD, hypertension, hyperlipidemia.  Wife and daughter at bedside.  They report a history of Nissen fundoplication 20 years ago.  Over the past 6 months he has been having really bad heartburn and reflux again.  Takes Pepcid every night before bed and has been doing Tums chewables all day long.  Last Thursday he started vomiting.  Says that from the beginning it was dark material.  This happened intermittently for a few days.  Sunday he had a good day.  Then Monday it started again.  Yesterday he told everyone the appearance of the emesis and they told him he needed to come to the emergency department.  Says that he has not been able to tolerate his pills.  When he tried to take them the other day he felt like they got hung up down his chest.  In regards to his bowel movements, he tends to be constipated.  Has not had a bowel movement about 8 days.  Used to take stool softeners, but not taking anything as of late.  No vomiting since yesterday, 5/14.  Hgb 13.7 grams.  BUN is normal.  Has been started on pantoprazole 40 mg IV BID.  CT scan of the abdomen and pelvis with contrast today showed the following:  IMPRESSION: 1. Distal esophageal wall thickening compatible with a degree of Esophagitis., Superimposed on moderate chronic gastric hiatal hernia.   2. But decompressed stomach. No gastro duodenal inflammation is evident. Small chronic distal duodenal diverticulum.   3. No other acute or inflammatory process identified in the abdomen or pelvis. Redundant large bowel with retained stool. Aortic  Atherosclerosis (ICD10-I70.0). Chronic postinflammatory scarring suspected at both lung bases.  Last colonoscopy 2010 with Dr. Juanda Chance showed mild diverticulosis and a sessile polyp that was removed and was a hyperplastic polyp on pathology.   Past Medical History:  Diagnosis Date   Abnormality of gait 03/31/2013   ALLERGIC RHINITIS 11/06/2007   Anemia, unspecified 08/22/2012   BENIGN PROSTATIC HYPERTROPHY 07/14/2007   CAD (coronary artery disease) 08/01/2022   Cardiomyopathy (HCC) 06/11/2022   Cervical spondylosis with radiculopathy 06/11/2022   Chronic osteomyelitis of toe of left foot (HCC) 09/12/2017   Chronic right shoulder pain 06/29/2022   COLONIC POLYPS, HX OF 07/14/2007   Complication of anesthesia    pt states he needs to be cathed after surgery   COVID-19    Degenerative arthritis of right knee 12/12/2011   Diabetes (HCC) 02/03/2013   Diet controlled   Diabetes mellitus due to underlying condition with unspecified complications (HCC) 06/11/2022   Disequilibrium 03/31/2013   Dyspnea 08/31/2019   Encounter for well adult exam with abnormal findings 12/10/2011   Essential hypertension 07/11/2007   Qualifier: Diagnosis of  By: Maris Berger    Former smoker 02/08/2015   GERD 07/11/2007   Hypercalcemia 08/12/2015   Hyperlipidemia    Hyperthyroidism    Hypervitaminosis D 01/14/2019   Hypothyroidism    Hypoxia    Lacunar infarction (HCC) 03/31/2013   Microalbuminuria 08/31/2019   Neck pain 03/26/2022   Neural foraminal stenosis of cervical spine 06/11/2022   OSTEOARTHRITIS, KNEE, RIGHT 10/27/2008  PERIPHERAL EDEMA 05/31/2009   Polyarthralgia 09/12/2017   Primary osteoarthritis of right shoulder 06/29/2022   PULMONARY NODULE 06/24/2008   granuloma   Recurrent falls 08/17/2016   Right shoulder pain 08/17/2016   Rotator cuff arthropathy 09/07/2016   Injected 09/07/2016 Repeat injection 01/07/17 Repeat injection April 20th 2018. Repeat injection 05/22/2017  Repeat injection 08/15/2017   Skin lesion 02/19/2017   Spinal stenosis in cervical region 03/31/2013   Spondylosis, cervical, with myelopathy 11/11/2012   TIA (transient ischemic attack) 08/13/2015   Upper back pain on right side 08/31/2019   Vitamin D deficiency 08/12/2015   Weight loss 08/31/2019    Past Surgical History:  Procedure Laterality Date   ANTERIOR CERVICAL DECOMP/DISCECTOMY FUSION N/A 01/06/2013   Procedure: ANTERIOR CERVICAL DECOMPRESSION/DISCECTOMY FUSION 1 LEVEL;  Surgeon: Maeola Harman, MD;  Location: [facility];  Service: Neurosurgery;  Laterality: N/A;  Cervical three-four Anterior cervical decompression/diskectomy/fusion   BACK SURGERY  1986   x 2  1986 C 3   HERNIA REPAIR  sept 1986   left knee replacement  Nov 21, 2007   NECK SURGERY     NISSEN FUNDOPLICATION  2000   right heel bone spur  1987   TOTAL KNEE ARTHROPLASTY  01/09/2012   Procedure: TOTAL KNEE ARTHROPLASTY;  Surgeon: Jacki Cones, MD;  Location: [facility];  Service: Orthopedics;  Laterality: Right;    Prior to Admission medications   Medication Sig Start Date End Date Taking? Authorizing Provider  albuterol (VENTOLIN HFA) 108 (90 Base) MCG/ACT inhaler Inhale 1-2 puffs into the lungs every 6 (six) hours as needed for wheezing or shortness of breath.   Yes [provider]  aspirin EC 325 MG tablet Take 1 tablet (325 mg total) by mouth once as needed (when stroke-like symptoms present). Patient taking differently: Take 325 mg by mouth daily. 02/26/19  Yes Corwin Levins, MD  atorvastatin (LIPITOR) 80 MG tablet Take 1 tablet (80 mg total) by mouth daily. 04/05/21  Yes Corwin Levins, MD  budesonide-formoterol [facility]) 160-4.5 MCG/ACT inhaler Inhale 2 puffs into the lungs 2 (two) times daily.   Yes [provider]  cetirizine (ZYRTEC) 10 MG tablet Take 1 tablet (10 mg total) by mouth daily. 08/31/19  Yes Corwin Levins, MD  cloNIDine (CATAPRES) 0.1 MG tablet Take 1 tablet (0.1 mg total) by  mouth daily. 11/16/22  Yes Revankar, Aundra Dubin, MD  clopidogrel (PLAVIX) 75 MG tablet Take 1 tablet (75 mg total) by mouth daily. 04/05/21  Yes Corwin Levins, MD  dapagliflozin propanediol (FARXIGA) 10 MG TABS tablet Take 1 tablet (10 mg total) by mouth daily before breakfast. 02/15/23  Yes Revankar, Aundra Dubin, MD  famotidine (PEPCID) 40 MG tablet Take 40 mg by mouth daily. 03/05/22  Yes [provider]  fenofibrate 160 MG tablet TAKE 1 TABLET BY MOUTH  DAILY Patient taking differently: Take 160 mg by mouth daily. 01/10/21  Yes Corwin Levins, MD  fluticasone [facility]) 50 MCG/ACT nasal spray Place 1 spray into both nostrils as needed for allergies or rhinitis. 12/28/21  Yes [provider]  labetalol (NORMODYNE) 300 MG tablet TAKE 1 TABLET BY MOUTH  TWICE DAILY Patient taking differently: Take 300 mg by mouth 2 (two) times daily. 01/25/21  Yes Corwin Levins, MD  levothyroxine (SYNTHROID) 100 MCG tablet Take 100 mcg by mouth daily before breakfast. 08/09/22  Yes [provider]  LORazepam (ATIVAN) 1 MG tablet Take 0.5 tablets by mouth at bedtime. 06/21/22  Yes [provider]  metFORMIN (GLUCOPHAGE-XR) 500 MG 24 hr tablet TAKE 4 TABLETS BY MOUTH  DAILY WITH BREAKFAST Patient taking differently: Take 500-1,500 mg by mouth See admin instructions. 3 am 1 pm 07/26/20  Yes Corwin Levins, MD  montelukast (SINGULAIR) 10 MG tablet Take 10 mg by mouth daily. 03/01/22  Yes [provider]  MYRBETRIQ 25 MG TB24 tablet Take 25 mg by mouth daily. 12/24/22  Yes [provider]  nitroGLYCERIN (NITROSTAT) 0.4 MG SL tablet Place 0.4 mg under the tongue every 5 (five) minutes as needed for chest pain.   Yes [provider]  sacubitril-valsartan (ENTRESTO) 97-103 MG Take 1 tablet by mouth 2 (two) times daily. 10/31/22  Yes Revankar, Aundra Dubin, MD  spironolactone (ALDACTONE) 25 MG tablet Take 0.5 tablets (12.5 mg total) by mouth daily. 01/30/23  Yes Revankar, Aundra Dubin, MD  TRELEGY  ELLIPTA 100-62.5-25 MCG/ACT AEPB Take 1 puff by mouth daily. 04/01/23  Yes [provider]  ACCU-CHEK GUIDE test strip USE AS INSTRUCTED ONCE  DAILY 02/27/22   Corwin Levins, MD  Accu-Chek Softclix Lancets lancets USE AS DIRECTED ONCE DAILY 01/06/22   Corwin Levins, MD  Blood Glucose Monitoring Suppl (ACCU-CHEK GUIDE) w/Device KIT 1 Device by Does not apply route daily. Once daily E11.9 02/29/20   Corwin Levins, MD  gabapentin (NEURONTIN) 400 MG capsule TAKE 1 CAPSULE BY MOUTH 3  TIMES DAILY Patient not taking: Reported on 04/10/2023 09/22/20   Corwin Levins, MD  glucose blood test strip 1 each by Other route 2 (two) times daily. Use to check blood sugars twice a day Dx E11.9 03/14/15   Corwin Levins, MD    No current facility-administered medications for this encounter.   Current Outpatient Medications  Medication Sig Dispense Refill   albuterol (VENTOLIN HFA) 108 (90 Base) MCG/ACT inhaler Inhale 1-2 puffs into the lungs every 6 (six) hours as needed for wheezing or shortness of breath.     aspirin EC 325 MG tablet Take 1 tablet (325 mg total) by mouth once as needed (when stroke-like symptoms present). (Patient taking differently: Take 325 mg by mouth daily.) 90 tablet 3   atorvastatin (LIPITOR) 80 MG tablet Take 1 tablet (80 mg total) by mouth daily. 30 tablet 0   budesonide-formoterol (SYMBICORT) 160-4.5 MCG/ACT inhaler Inhale 2 puffs into the lungs 2 (two) times daily.     cetirizine (ZYRTEC) 10 MG tablet Take 1 tablet (10 mg total) by mouth daily. 90 tablet 3   cloNIDine (CATAPRES) 0.1 MG tablet Take 1 tablet (0.1 mg total) by mouth daily. 90 tablet 3   clopidogrel (PLAVIX) 75 MG tablet Take 1 tablet (75 mg total) by mouth daily. 30 tablet 0   dapagliflozin propanediol (FARXIGA) 10 MG TABS tablet Take 1 tablet (10 mg total) by mouth daily before breakfast. 30 tablet 12   famotidine (PEPCID) 40 MG tablet Take 40 mg by mouth daily.     fenofibrate 160 MG tablet TAKE 1 TABLET BY MOUTH   DAILY (Patient taking differently: Take 160 mg by mouth daily.) 90 tablet 3   fluticasone (FLONASE) 50 MCG/ACT nasal spray Place 1 spray into both nostrils as needed for allergies or rhinitis.     labetalol (NORMODYNE) 300 MG tablet TAKE 1 TABLET BY MOUTH  TWICE DAILY (Patient taking differently: Take 300 mg by mouth 2 (two) times daily.) 90 tablet 0   levothyroxine (SYNTHROID) 100 MCG tablet Take 100 mcg by mouth daily before breakfast.  LORazepam (ATIVAN) 1 MG tablet Take 0.5 tablets by mouth at bedtime.     metFORMIN (GLUCOPHAGE-XR) 500 MG 24 hr tablet TAKE 4 TABLETS BY MOUTH  DAILY WITH BREAKFAST (Patient taking differently: Take 500-1,500 mg by mouth See admin instructions. 3 am 1 pm) 360 tablet 2   montelukast (SINGULAIR) 10 MG tablet Take 10 mg by mouth daily.     MYRBETRIQ 25 MG TB24 tablet Take 25 mg by mouth daily.     nitroGLYCERIN (NITROSTAT) 0.4 MG SL tablet Place 0.4 mg under the tongue every 5 (five) minutes as needed for chest pain.     sacubitril-valsartan (ENTRESTO) 97-103 MG Take 1 tablet by mouth 2 (two) times daily. 180 tablet 3   spironolactone (ALDACTONE) 25 MG tablet Take 0.5 tablets (12.5 mg total) by mouth daily. 45 tablet 3   TRELEGY ELLIPTA 100-62.5-25 MCG/ACT AEPB Take 1 puff by mouth daily.     ACCU-CHEK GUIDE test strip USE AS INSTRUCTED ONCE  DAILY 100 strip 3   Accu-Chek Softclix Lancets lancets USE AS DIRECTED ONCE DAILY 100 each 3   Blood Glucose Monitoring Suppl (ACCU-CHEK GUIDE) w/Device KIT 1 Device by Does not apply route daily. Once daily E11.9 1 kit 0   gabapentin (NEURONTIN) 400 MG capsule TAKE 1 CAPSULE BY MOUTH 3  TIMES DAILY (Patient not taking: Reported on 04/10/2023) 270 capsule 3   glucose blood test strip 1 each by Other route 2 (two) times daily. Use to check blood sugars twice a day Dx E11.9 300 each 2    Allergies as of 04/09/2023 - Review Complete 04/09/2023  Allergen Reaction Noted   Latex Rash and Other (See Comments) 12/22/2020    Zolpidem tartrate  01/01/2012   Codeine Other (See Comments)    Hct [hydrochlorothiazide] Other (See Comments) 08/31/2019   Metformin and related Diarrhea and Other (See Comments) 09/18/2018   Naproxen Diarrhea    Prednisone Diarrhea    Zolpidem tartrate Other (See Comments) 01/01/2012   Adhesive [tape] Rash and Other (See Comments) 01/01/2012   Penicillins Rash and Other (See Comments)     Family History  Problem Relation Age of Onset   Heart attack Mother    Heart attack Father    Emphysema Father    Heart disease Other    Diabetes Neg Hx    Thyroid disease Neg Hx    Hypercalcemia Neg Hx     Social History   Socioeconomic History   Marital status: Married    Spouse name: Eber Jones   Number of children: 2   Years of education: HS   Highest education level: Not on file  Occupational History   Occupation: Retired  Tobacco Use   Smoking status: Former    Packs/day: 3.00    Years: 30.00    Additional pack years: 0.00    Total pack years: 90.00    Types: Cigarettes    Quit date: 11/26/1982    Years since quitting: 40.3   Smokeless tobacco: Never  Substance and Sexual Activity   Alcohol use: No   Drug use: No   Sexual activity: Not on file  Other Topics Concern   Not on file  Social History Narrative   Patient lives at home with spouse.   Caffiene Use: 4 cups daily   Social Determinants of Health   Financial Resource Strain: Not on file  Food Insecurity: Not on file  Transportation Needs: Not on file  Physical Activity: Not on file  Stress: Not on file  Social Connections: Not on file  Intimate Partner Violence: Not on file    Review of Systems: ROS is O/W negative except as mentioned in HPI.  Physical Exam: Vital signs in last 24 hours: Temp:  [98.4 F (36.9 C)-98.9 F (37.2 C)] 98.7 F (37.1 C) (05/15 0945) Pulse Rate:  [70-93] 79 (05/15 0945) Resp:  [17-22] 18 (05/15 0945) BP: (127-155)/(72-87) 141/72 (05/15 0945) SpO2:  [91 %-99 %] 91 % (05/15  0945) Weight:  [79.4 kg] 79.4 kg (05/14 2202)   General:  Alert, Well-developed, well-nourished, pleasant and cooperative in NAD Head:  Normocephalic and atraumatic. Eyes:  Sclera clear, no icterus.  Conjunctiva pink. Ears:  Normal auditory acuity. Mouth:  No deformity or lesions.   Lungs:  Clear throughout to auscultation.  No wheezes, crackles, or rhonchi.  Heart:  Regular rate and rhythm; no murmurs, clicks, rubs, or gallops. Abdomen:  Soft, non-distended.  BS present.  Non-tender.   Msk:  Symmetrical without gross deformities. Pulses:  Normal pulses noted. Extremities:  Without clubbing or edema. Neurologic:  Alert and oriented x 4;  grossly normal neurologically. Skin:  Intact without significant lesions or rashes. Psych:  Alert and cooperative. Normal mood and affect.  Intake/Output from previous day: 05/14 0701 - 05/15 0700 In: 1000 [IV Piggyback:1000] Out: -   Lab Results: Recent Labs    04/09/23 2229  WBC 10.2  HGB 13.7  HCT 42.0  PLT 341   BMET Recent Labs    04/09/23 2229  NA 138  K 3.1*  CL 98  CO2 24  GLUCOSE 126*  BUN 17  CREATININE 1.13  CALCIUM 9.3   LFT Recent Labs    04/09/23 2229  PROT 6.9  ALBUMIN 3.2*  AST 27  ALT 23  ALKPHOS 48  BILITOT 1.4*   Studies/Results: CT ABDOMEN PELVIS W CONTRAST  Result Date: 04/10/2023 CLINICAL DATA:  77 year old male with epigastric pain, nausea vomiting for 4 days. Right flank pain and no recent bowel movement. EXAM: CT ABDOMEN AND PELVIS WITH CONTRAST TECHNIQUE: Multidetector CT imaging of the abdomen and pelvis was performed using the standard protocol following bolus administration of intravenous contrast. RADIATION DOSE REDUCTION: This exam was performed according to the departmental dose-optimization program which includes automated exposure control, adjustment of the mA and/or kV according to patient size and/or use of iterative reconstruction technique. CONTRAST:  75mL OMNIPAQUE IOHEXOL 350 MG/ML  SOLN COMPARISON:  [facility] chest CTA 08/21/2021 and CTA abdomen and runoff 10/15/2017. FINDINGS: Lower chest: Emphysema demonstrated on the 2022 CT with upper lobe predominance and heterogeneous peribronchial opacity at the lung bases today most resembles postinflammatory scarring, with occasional small calcified lung nodules. No pleural effusion. Heart size within normal limits. No pericardial effusion. Hepatobiliary: Gallbladder mildly distended but does not appear inflamed and no sludge or stones are evident. No biliary ductal dilatation. Liver enhancement within normal limits. Pancreas: Partially atrophied but no active inflammation. Mild dystrophic calcifications, no main pancreatic ductal dilatation. Spleen: Negative.  Punctate calcified granuloma. Adrenals/Urinary Tract: Negative adrenal glands. Nonobstructed kidneys with symmetric renal enhancement and contrast excretion. Decompressed ureters. Unremarkable bladder. Stomach/Bowel: Redundant sigmoid colon with mild diverticulosis, but the sigmoid is fairly decompressed. There is mild retained stool in the rectum. Upstream moderate retained stool in redundant transverse colon and colonic flexures. Terminal ileum is decompressed. No large bowel inflammation is identified and decompressed normal appendix is suspected on coronal image 77. No dilated small bowel. No free air, free fluid, or mesenteric inflammation identified. Chronic mild to  moderate hiatal hernia. And there is circumferential wall thickening of the visible distal esophagus (series 3, image 5). Small chronic surgical clips along the anterior gastric hernia. Intra-abdominal stomach and duodenum are decompressed. There is a small 2.3 cm diverticulum at the ligament of Treitz which is chronic (series 3, image 37) with no adjacent inflammation. Vascular/Lymphatic: Extensive Aortoiliac calcified atherosclerosis. Major arterial structures in the abdomen and pelvis remain patent. Normal caliber  abdominal aorta. Portal venous system appears patent. No lymphadenopathy identified. Reproductive: Mild prostatomegaly.  Pelvic phleboliths. Other: No pelvis free fluid. Musculoskeletal: Multilevel spine degeneration. No acute or suspicious osseous lesion. IMPRESSION: 1. Distal esophageal wall thickening compatible with a degree of Esophagitis., Superimposed on moderate chronic gastric hiatal hernia. 2. But decompressed stomach. No gastro duodenal inflammation is evident. Small chronic distal duodenal diverticulum. 3. No other acute or inflammatory process identified in the abdomen or pelvis. Redundant large bowel with retained stool. Aortic Atherosclerosis (ICD10-I70.0). Chronic postinflammatory scarring suspected at both lung bases. Electronically Signed   By: Odessa Fleming M.D.   On: 04/10/2023 05:36   DG Chest 2 View  Result Date: 04/10/2023 CLINICAL DATA:  Concern for pneumonia EXAM: CHEST - 2 VIEW COMPARISON:  12/22/2020 FINDINGS: Heart and mediastinal contours within normal limits. Interstitial prominence throughout the lungs, right greater than left, similar to prior study. This could reflect chronic lung disease. No visible effusions or acute bony abnormality. IMPRESSION: Interstitial prominence within the lungs, slightly greater on the right. Favor chronic lung disease. Electronically Signed   By: Charlett Nose M.D.   On: 04/10/2023 03:39    IMPRESSION:  *CGE:  Episodes of vomiting dark material intermittently x 6 days.  Hgb 13.7 grams and BUN normal.  Distal esophageal wall thickening compatible with a degree of esophagitis and superimposed on moderate chronic gastric hiatal hernia on CT scan.  Reports severe reflux symptoms over the past 6 months, using Pepcid at bedtime and Tums chewables throughout the day. *Redundant large bowel with retained stool on CT scan.  Has underlying constipation.  No BM in 8 days. *Antiplatelet use with Plavix due to history of cardiac and carotid vascular  disease  PLAN: -Has been started on Pantoprazole 40 mg V BID.  Continue. -? EGD on 5/16 on Plavix.  Per Dr. Irving Burton final recs. -Monitor Hgb. -Can try sips of clears today for now. -Will order a dulcolax suppository for today and then when taking PO can start Miralax daily.  -Hold Plavix for now.  Princella Pellegrini. Zehr  04/10/2023, 10:20 AM   I have taken an interval history, thoroughly reviewed the chart and examined the patient. I agree with the Advanced Practitioner's note, impression and recommendations, and have recorded additional findings, impressions and recommendations below. I performed a substantive portion of this encounter (>50% time spent), including a complete performance of the medical decision making.  My additional thoughts are as follows:  He also reports an approximately 50 pound weight loss in about the last year.  Unknown if he has brought this to attention of primary care or other provider.  This 77 year old man with prior fundoplication has a hiatal hernia that has been present for an unknown period of time.  He has at least 6 months of slowly worsening dysphagia, and in recent days had vomiting and what sounds like some small-volume hematemesis/coffee-ground material.  This is exacerbated by his Plavix. He has not been on prescription acid suppression medicines, having self treated with multiple OTC meds in recent months.  Concern for reflux  esophagitis and malignancy, especially with distal esophageal wall thickening noted on CT.  He has also been constipated with no bowel movement in over a week, and this may have contributed to his vomiting.  Plan is for an upper endoscopy tomorrow.  Since he is still therapeutic on Plavix, we would not be able to perform most interventions including dilation if any stricture is discovered.  He was agreeable to the upper endoscopy after discussion of procedure and risks in the presence of his wife and nurse.  The benefits and risks  of the planned procedure were described in detail with the patient or (when appropriate) their health care proxy.  Risks were outlined as including, but not limited to, bleeding, infection, perforation, adverse medication reaction leading to cardiac or pulmonary decompensation, pancreatitis (if ERCP).  The limitation of incomplete mucosal visualization was also discussed.  No guarantees or warranties were given. Patient at increased risk for cardiopulmonary complications of procedure due to medical comorbidities.   Charlie Pitter III Office:[phone]

## 2023-04-10 NOTE — H&P (View-Only) (Signed)
 Referring Provider: ? Primary Care Physician:  O'Buch, Greta, PA-C Primary Gastroenterologist:  Dr. Brodie  Reason for Consultation:  Dysphagia and hematemesis  HPI: [name] is a 77 y.o. male with past medical history of vascular disease including coronary artery disease and carotid vascular disease with history of TIA several years ago.  Is on Plavix for these issues.  Also has COPD, hypertension, hyperlipidemia.  Wife and daughter at bedside.  They report a history of Nissen fundoplication 20 years ago.  Over the past 6 months he has been having really bad heartburn and reflux again.  Takes Pepcid every night before bed and has been doing Tums chewables all day long.  Last Thursday he started vomiting.  Says that from the beginning it was dark material.  This happened intermittently for a few days.  Sunday he had a good day.  Then Monday it started again.  Yesterday he told everyone the appearance of the emesis and they told him he needed to come to the emergency department.  Says that he has not been able to tolerate his pills.  When he tried to take them the other day he felt like they got hung up down his chest.  In regards to his bowel movements, he tends to be constipated.  Has not had a bowel movement about 8 days.  Used to take stool softeners, but not taking anything as of late.  No vomiting since yesterday, 5/14.  Hgb 13.7 grams.  BUN is normal.  Has been started on pantoprazole 40 mg IV BID.  CT scan of the abdomen and pelvis with contrast today showed the following:  IMPRESSION: 1. Distal esophageal wall thickening compatible with a degree of Esophagitis., Superimposed on moderate chronic gastric hiatal hernia.   2. But decompressed stomach. No gastro duodenal inflammation is evident. Small chronic distal duodenal diverticulum.   3. No other acute or inflammatory process identified in the abdomen or pelvis. Redundant large bowel with retained stool. Aortic  Atherosclerosis (ICD10-I70.0). Chronic postinflammatory scarring suspected at both lung bases.  Last colonoscopy 2010 with Dr. Brodie showed mild diverticulosis and a sessile polyp that was removed and was a hyperplastic polyp on pathology.   Past Medical History:  Diagnosis Date   Abnormality of gait 03/31/2013   ALLERGIC RHINITIS 11/06/2007   Anemia, unspecified 08/22/2012   BENIGN PROSTATIC HYPERTROPHY 07/14/2007   CAD (coronary artery disease) 08/01/2022   Cardiomyopathy (HCC) 06/11/2022   Cervical spondylosis with radiculopathy 06/11/2022   Chronic osteomyelitis of toe of left foot (HCC) 09/12/2017   Chronic right shoulder pain 06/29/2022   COLONIC POLYPS, HX OF 07/14/2007   Complication of anesthesia    pt states he needs to be cathed after surgery   COVID-19    Degenerative arthritis of right knee 12/12/2011   Diabetes (HCC) 02/03/2013   Diet controlled   Diabetes mellitus due to underlying condition with unspecified complications (HCC) 06/11/2022   Disequilibrium 03/31/2013   Dyspnea 08/31/2019   Encounter for well adult exam with abnormal findings 12/10/2011   Essential hypertension 07/11/2007   Qualifier: Diagnosis of  By: Sherwood, Elizabeth Ann    Former smoker 02/08/2015   GERD 07/11/2007   Hypercalcemia 08/12/2015   Hyperlipidemia    Hyperthyroidism    Hypervitaminosis D 01/14/2019   Hypothyroidism    Hypoxia    Lacunar infarction (HCC) 03/31/2013   Microalbuminuria 08/31/2019   Neck pain 03/26/2022   Neural foraminal stenosis of cervical spine 06/11/2022   OSTEOARTHRITIS, KNEE, RIGHT 10/27/2008     PERIPHERAL EDEMA 05/31/2009   Polyarthralgia 09/12/2017   Primary osteoarthritis of right shoulder 06/29/2022   PULMONARY NODULE 06/24/2008   granuloma   Recurrent falls 08/17/2016   Right shoulder pain 08/17/2016   Rotator cuff arthropathy 09/07/2016   Injected 09/07/2016 Repeat injection 01/07/17 Repeat injection April 20th 2018. Repeat injection 05/22/2017  Repeat injection 08/15/2017   Skin lesion 02/19/2017   Spinal stenosis in cervical region 03/31/2013   Spondylosis, cervical, with myelopathy 11/11/2012   TIA (transient ischemic attack) 08/13/2015   Upper back pain on right side 08/31/2019   Vitamin D deficiency 08/12/2015   Weight loss 08/31/2019    Past Surgical History:  Procedure Laterality Date   ANTERIOR CERVICAL DECOMP/DISCECTOMY FUSION N/A 01/06/2013   Procedure: ANTERIOR CERVICAL DECOMPRESSION/DISCECTOMY FUSION 1 LEVEL;  Surgeon: Joseph Stern, MD;  Location: [facility];  Service: Neurosurgery;  Laterality: N/A;  Cervical three-four Anterior cervical decompression/diskectomy/fusion   BACK SURGERY  1986   x 2  1986 C 3   HERNIA REPAIR  sept 1986   left knee replacement  Nov 21, 2007   NECK SURGERY     NISSEN FUNDOPLICATION  2000   right heel bone spur  1987   TOTAL KNEE ARTHROPLASTY  01/09/2012   Procedure: TOTAL KNEE ARTHROPLASTY;  Surgeon: Ronald A Gioffre, MD;  Location: [facility];  Service: Orthopedics;  Laterality: Right;    Prior to Admission medications   Medication Sig Start Date End Date Taking? Authorizing Provider  albuterol (VENTOLIN HFA) 108 (90 Base) MCG/ACT inhaler Inhale 1-2 puffs into the lungs every 6 (six) hours as needed for wheezing or shortness of breath.   Yes [provider]  aspirin EC 325 MG tablet Take 1 tablet (325 mg total) by mouth once as needed (when stroke-like symptoms present). Patient taking differently: Take 325 mg by mouth daily. 02/26/19  Yes John, James W, MD  atorvastatin (LIPITOR) 80 MG tablet Take 1 tablet (80 mg total) by mouth daily. 04/05/21  Yes John, James W, MD  budesonide-formoterol (SYMBICORT) 160-4.5 MCG/ACT inhaler Inhale 2 puffs into the lungs 2 (two) times daily.   Yes [provider]  cetirizine (ZYRTEC) 10 MG tablet Take 1 tablet (10 mg total) by mouth daily. 08/31/19  Yes John, James W, MD  cloNIDine (CATAPRES) 0.1 MG tablet Take 1 tablet (0.1 mg total) by  mouth daily. 11/16/22  Yes Revankar, Rajan R, MD  clopidogrel (PLAVIX) 75 MG tablet Take 1 tablet (75 mg total) by mouth daily. 04/05/21  Yes John, James W, MD  dapagliflozin propanediol (FARXIGA) 10 MG TABS tablet Take 1 tablet (10 mg total) by mouth daily before breakfast. 02/15/23  Yes Revankar, Rajan R, MD  famotidine (PEPCID) 40 MG tablet Take 40 mg by mouth daily. 03/05/22  Yes [provider]  fenofibrate 160 MG tablet TAKE 1 TABLET BY MOUTH  DAILY Patient taking differently: Take 160 mg by mouth daily. 01/10/21  Yes John, James W, MD  fluticasone (FLONASE) 50 MCG/ACT nasal spray Place 1 spray into both nostrils as needed for allergies or rhinitis. 12/28/21  Yes [provider]  labetalol (NORMODYNE) 300 MG tablet TAKE 1 TABLET BY MOUTH  TWICE DAILY Patient taking differently: Take 300 mg by mouth 2 (two) times daily. 01/25/21  Yes John, James W, MD  levothyroxine (SYNTHROID) 100 MCG tablet Take 100 mcg by mouth daily before breakfast. 08/09/22  Yes [provider]  LORazepam (ATIVAN) 1 MG tablet Take 0.5 tablets by mouth at bedtime. 06/21/22  Yes [provider]  metFORMIN (GLUCOPHAGE-XR) 500 MG 24 hr tablet TAKE 4 TABLETS BY MOUTH  DAILY WITH BREAKFAST Patient taking differently: Take 500-1,500 mg by mouth See admin instructions. 3 am 1 pm 07/26/20  Yes John, James W, MD  montelukast (SINGULAIR) 10 MG tablet Take 10 mg by mouth daily. 03/01/22  Yes [provider]  MYRBETRIQ 25 MG TB24 tablet Take 25 mg by mouth daily. 12/24/22  Yes [provider]  nitroGLYCERIN (NITROSTAT) 0.4 MG SL tablet Place 0.4 mg under the tongue every 5 (five) minutes as needed for chest pain.   Yes [provider]  sacubitril-valsartan (ENTRESTO) 97-103 MG Take 1 tablet by mouth 2 (two) times daily. 10/31/22  Yes Revankar, Rajan R, MD  spironolactone (ALDACTONE) 25 MG tablet Take 0.5 tablets (12.5 mg total) by mouth daily. 01/30/23  Yes Revankar, Rajan R, MD  TRELEGY  ELLIPTA 100-62.5-25 MCG/ACT AEPB Take 1 puff by mouth daily. 04/01/23  Yes [provider]  ACCU-CHEK GUIDE test strip USE AS INSTRUCTED ONCE  DAILY 02/27/22   John, James W, MD  Accu-Chek Softclix Lancets lancets USE AS DIRECTED ONCE DAILY 01/06/22   John, James W, MD  Blood Glucose Monitoring Suppl (ACCU-CHEK GUIDE) w/Device KIT 1 Device by Does not apply route daily. Once daily E11.9 02/29/20   John, James W, MD  gabapentin (NEURONTIN) 400 MG capsule TAKE 1 CAPSULE BY MOUTH 3  TIMES DAILY Patient not taking: Reported on 04/10/2023 09/22/20   John, James W, MD  glucose blood test strip 1 each by Other route 2 (two) times daily. Use to check blood sugars twice a day Dx E11.9 03/14/15   John, James W, MD    No current facility-administered medications for this encounter.   Current Outpatient Medications  Medication Sig Dispense Refill   albuterol (VENTOLIN HFA) 108 (90 Base) MCG/ACT inhaler Inhale 1-2 puffs into the lungs every 6 (six) hours as needed for wheezing or shortness of breath.     aspirin EC 325 MG tablet Take 1 tablet (325 mg total) by mouth once as needed (when stroke-like symptoms present). (Patient taking differently: Take 325 mg by mouth daily.) 90 tablet 3   atorvastatin (LIPITOR) 80 MG tablet Take 1 tablet (80 mg total) by mouth daily. 30 tablet 0   budesonide-formoterol (SYMBICORT) 160-4.5 MCG/ACT inhaler Inhale 2 puffs into the lungs 2 (two) times daily.     cetirizine (ZYRTEC) 10 MG tablet Take 1 tablet (10 mg total) by mouth daily. 90 tablet 3   cloNIDine (CATAPRES) 0.1 MG tablet Take 1 tablet (0.1 mg total) by mouth daily. 90 tablet 3   clopidogrel (PLAVIX) 75 MG tablet Take 1 tablet (75 mg total) by mouth daily. 30 tablet 0   dapagliflozin propanediol (FARXIGA) 10 MG TABS tablet Take 1 tablet (10 mg total) by mouth daily before breakfast. 30 tablet 12   famotidine (PEPCID) 40 MG tablet Take 40 mg by mouth daily.     fenofibrate 160 MG tablet TAKE 1 TABLET BY MOUTH   DAILY (Patient taking differently: Take 160 mg by mouth daily.) 90 tablet 3   fluticasone (FLONASE) 50 MCG/ACT nasal spray Place 1 spray into both nostrils as needed for allergies or rhinitis.     labetalol (NORMODYNE) 300 MG tablet TAKE 1 TABLET BY MOUTH  TWICE DAILY (Patient taking differently: Take 300 mg by mouth 2 (two) times daily.) 90 tablet 0   levothyroxine (SYNTHROID) 100 MCG tablet Take 100 mcg by mouth daily before breakfast.       LORazepam (ATIVAN) 1 MG tablet Take 0.5 tablets by mouth at bedtime.     metFORMIN (GLUCOPHAGE-XR) 500 MG 24 hr tablet TAKE 4 TABLETS BY MOUTH  DAILY WITH BREAKFAST (Patient taking differently: Take 500-1,500 mg by mouth See admin instructions. 3 am 1 pm) 360 tablet 2   montelukast (SINGULAIR) 10 MG tablet Take 10 mg by mouth daily.     MYRBETRIQ 25 MG TB24 tablet Take 25 mg by mouth daily.     nitroGLYCERIN (NITROSTAT) 0.4 MG SL tablet Place 0.4 mg under the tongue every 5 (five) minutes as needed for chest pain.     sacubitril-valsartan (ENTRESTO) 97-103 MG Take 1 tablet by mouth 2 (two) times daily. 180 tablet 3   spironolactone (ALDACTONE) 25 MG tablet Take 0.5 tablets (12.5 mg total) by mouth daily. 45 tablet 3   TRELEGY ELLIPTA 100-62.5-25 MCG/ACT AEPB Take 1 puff by mouth daily.     ACCU-CHEK GUIDE test strip USE AS INSTRUCTED ONCE  DAILY 100 strip 3   Accu-Chek Softclix Lancets lancets USE AS DIRECTED ONCE DAILY 100 each 3   Blood Glucose Monitoring Suppl (ACCU-CHEK GUIDE) w/Device KIT 1 Device by Does not apply route daily. Once daily E11.9 1 kit 0   gabapentin (NEURONTIN) 400 MG capsule TAKE 1 CAPSULE BY MOUTH 3  TIMES DAILY (Patient not taking: Reported on 04/10/2023) 270 capsule 3   glucose blood test strip 1 each by Other route 2 (two) times daily. Use to check blood sugars twice a day Dx E11.9 300 each 2    Allergies as of 04/09/2023 - Review Complete 04/09/2023  Allergen Reaction Noted   Latex Rash and Other (See Comments) 12/22/2020    Zolpidem tartrate  01/01/2012   Codeine Other (See Comments)    Hct [hydrochlorothiazide] Other (See Comments) 08/31/2019   Metformin and related Diarrhea and Other (See Comments) 09/18/2018   Naproxen Diarrhea    Prednisone Diarrhea    Zolpidem tartrate Other (See Comments) 01/01/2012   Adhesive [tape] Rash and Other (See Comments) 01/01/2012   Penicillins Rash and Other (See Comments)     Family History  Problem Relation Age of Onset   Heart attack Mother    Heart attack Father    Emphysema Father    Heart disease Other    Diabetes Neg Hx    Thyroid disease Neg Hx    Hypercalcemia Neg Hx     Social History   Socioeconomic History   Marital status: Married    Spouse name: Carolyn   Number of children: 2   Years of education: HS   Highest education level: Not on file  Occupational History   Occupation: Retired  Tobacco Use   Smoking status: Former    Packs/day: 3.00    Years: 30.00    Additional pack years: 0.00    Total pack years: 90.00    Types: Cigarettes    Quit date: 11/26/1982    Years since quitting: 40.3   Smokeless tobacco: Never  Substance and Sexual Activity   Alcohol use: No   Drug use: No   Sexual activity: Not on file  Other Topics Concern   Not on file  Social History Narrative   Patient lives at home with spouse.   Caffiene Use: 4 cups daily   Social Determinants of Health   Financial Resource Strain: Not on file  Food Insecurity: Not on file  Transportation Needs: Not on file  Physical Activity: Not on file  Stress: Not on file    Social Connections: Not on file  Intimate Partner Violence: Not on file    Review of Systems: ROS is O/W negative except as mentioned in HPI.  Physical Exam: Vital signs in last 24 hours: Temp:  [98.4 F (36.9 C)-98.9 F (37.2 C)] 98.7 F (37.1 C) (05/15 0945) Pulse Rate:  [70-93] 79 (05/15 0945) Resp:  [17-22] 18 (05/15 0945) BP: (127-155)/(72-87) 141/72 (05/15 0945) SpO2:  [91 %-99 %] 91 % (05/15  0945) Weight:  [79.4 kg] 79.4 kg (05/14 2202)   General:  Alert, Well-developed, well-nourished, pleasant and cooperative in NAD Head:  Normocephalic and atraumatic. Eyes:  Sclera clear, no icterus.  Conjunctiva pink. Ears:  Normal auditory acuity. Mouth:  No deformity or lesions.   Lungs:  Clear throughout to auscultation.  No wheezes, crackles, or rhonchi.  Heart:  Regular rate and rhythm; no murmurs, clicks, rubs, or gallops. Abdomen:  Soft, non-distended.  BS present.  Non-tender.   Msk:  Symmetrical without gross deformities. Pulses:  Normal pulses noted. Extremities:  Without clubbing or edema. Neurologic:  Alert and oriented x 4;  grossly normal neurologically. Skin:  Intact without significant lesions or rashes. Psych:  Alert and cooperative. Normal mood and affect.  Intake/Output from previous day: 05/14 0701 - 05/15 0700 In: 1000 [IV Piggyback:1000] Out: -   Lab Results: Recent Labs    04/09/23 2229  WBC 10.2  HGB 13.7  HCT 42.0  PLT 341   BMET Recent Labs    04/09/23 2229  NA 138  K 3.1*  CL 98  CO2 24  GLUCOSE 126*  BUN 17  CREATININE 1.13  CALCIUM 9.3   LFT Recent Labs    04/09/23 2229  PROT 6.9  ALBUMIN 3.2*  AST 27  ALT 23  ALKPHOS 48  BILITOT 1.4*   Studies/Results: CT ABDOMEN PELVIS W CONTRAST  Result Date: 04/10/2023 CLINICAL DATA:  77-year-old male with epigastric pain, nausea vomiting for 4 days. Right flank pain and no recent bowel movement. EXAM: CT ABDOMEN AND PELVIS WITH CONTRAST TECHNIQUE: Multidetector CT imaging of the abdomen and pelvis was performed using the standard protocol following bolus administration of intravenous contrast. RADIATION DOSE REDUCTION: This exam was performed according to the departmental dose-optimization program which includes automated exposure control, adjustment of the mA and/or kV according to patient size and/or use of iterative reconstruction technique. CONTRAST:  75mL OMNIPAQUE IOHEXOL 350 MG/ML  SOLN COMPARISON:  [facility] chest CTA 08/21/2021 and CTA abdomen and runoff 10/15/2017. FINDINGS: Lower chest: Emphysema demonstrated on the 2022 CT with upper lobe predominance and heterogeneous peribronchial opacity at the lung bases today most resembles postinflammatory scarring, with occasional small calcified lung nodules. No pleural effusion. Heart size within normal limits. No pericardial effusion. Hepatobiliary: Gallbladder mildly distended but does not appear inflamed and no sludge or stones are evident. No biliary ductal dilatation. Liver enhancement within normal limits. Pancreas: Partially atrophied but no active inflammation. Mild dystrophic calcifications, no main pancreatic ductal dilatation. Spleen: Negative.  Punctate calcified granuloma. Adrenals/Urinary Tract: Negative adrenal glands. Nonobstructed kidneys with symmetric renal enhancement and contrast excretion. Decompressed ureters. Unremarkable bladder. Stomach/Bowel: Redundant sigmoid colon with mild diverticulosis, but the sigmoid is fairly decompressed. There is mild retained stool in the rectum. Upstream moderate retained stool in redundant transverse colon and colonic flexures. Terminal ileum is decompressed. No large bowel inflammation is identified and decompressed normal appendix is suspected on coronal image 77. No dilated small bowel. No free air, free fluid, or mesenteric inflammation identified. Chronic mild to   moderate hiatal hernia. And there is circumferential wall thickening of the visible distal esophagus (series 3, image 5). Small chronic surgical clips along the anterior gastric hernia. Intra-abdominal stomach and duodenum are decompressed. There is a small 2.3 cm diverticulum at the ligament of Treitz which is chronic (series 3, image 37) with no adjacent inflammation. Vascular/Lymphatic: Extensive Aortoiliac calcified atherosclerosis. Major arterial structures in the abdomen and pelvis remain patent. Normal caliber  abdominal aorta. Portal venous system appears patent. No lymphadenopathy identified. Reproductive: Mild prostatomegaly.  Pelvic phleboliths. Other: No pelvis free fluid. Musculoskeletal: Multilevel spine degeneration. No acute or suspicious osseous lesion. IMPRESSION: 1. Distal esophageal wall thickening compatible with a degree of Esophagitis., Superimposed on moderate chronic gastric hiatal hernia. 2. But decompressed stomach. No gastro duodenal inflammation is evident. Small chronic distal duodenal diverticulum. 3. No other acute or inflammatory process identified in the abdomen or pelvis. Redundant large bowel with retained stool. Aortic Atherosclerosis (ICD10-I70.0). Chronic postinflammatory scarring suspected at both lung bases. Electronically Signed   By: H  Hall M.D.   On: 04/10/2023 05:36   DG Chest 2 View  Result Date: 04/10/2023 CLINICAL DATA:  Concern for pneumonia EXAM: CHEST - 2 VIEW COMPARISON:  12/22/2020 FINDINGS: Heart and mediastinal contours within normal limits. Interstitial prominence throughout the lungs, right greater than left, similar to prior study. This could reflect chronic lung disease. No visible effusions or acute bony abnormality. IMPRESSION: Interstitial prominence within the lungs, slightly greater on the right. Favor chronic lung disease. Electronically Signed   By: Kevin  Dover M.D.   On: 04/10/2023 03:39    IMPRESSION:  *CGE:  Episodes of vomiting dark material intermittently x 6 days.  Hgb 13.7 grams and BUN normal.  Distal esophageal wall thickening compatible with a degree of esophagitis and superimposed on moderate chronic gastric hiatal hernia on CT scan.  Reports severe reflux symptoms over the past 6 months, using Pepcid at bedtime and Tums chewables throughout the day. *Redundant large bowel with retained stool on CT scan.  Has underlying constipation.  No BM in 8 days. *Antiplatelet use with Plavix due to history of cardiac and carotid vascular  disease  PLAN: -Has been started on Pantoprazole 40 mg V BID.  Continue. -? EGD on 5/16 on Plavix.  Per Dr. Danis's final recs. -Monitor Hgb. -Can try sips of clears today for now. -Will order a dulcolax suppository for today and then when taking PO can start Miralax daily.  -Hold Plavix for now.  Jessica D. Zehr  04/10/2023, 10:20 AM   I have taken an interval history, thoroughly reviewed the chart and examined the patient. I agree with the Advanced Practitioner's note, impression and recommendations, and have recorded additional findings, impressions and recommendations below. I performed a substantive portion of this encounter (>50% time spent), including a complete performance of the medical decision making.  My additional thoughts are as follows:  He also reports an approximately 50 pound weight loss in about the last year.  Unknown if he has brought this to attention of primary care or other provider.  This 77-year-old man with prior fundoplication has a hiatal hernia that has been present for an unknown period of time.  He has at least 6 months of slowly worsening dysphagia, and in recent days had vomiting and what sounds like some small-volume hematemesis/coffee-ground material.  This is exacerbated by his Plavix. He has not been on prescription acid suppression medicines, having self treated with multiple OTC meds in recent months.  Concern for reflux   esophagitis and malignancy, especially with distal esophageal wall thickening noted on CT.  He has also been constipated with no bowel movement in over a week, and this may have contributed to his vomiting.  Plan is for an upper endoscopy tomorrow.  Since he is still therapeutic on Plavix, we would not be able to perform most interventions including dilation if any stricture is discovered.  He was agreeable to the upper endoscopy after discussion of procedure and risks in the presence of his wife and nurse.  The benefits and risks  of the planned procedure were described in detail with the patient or (when appropriate) their health care proxy.  Risks were outlined as including, but not limited to, bleeding, infection, perforation, adverse medication reaction leading to cardiac or pulmonary decompensation, pancreatitis (if ERCP).  The limitation of incomplete mucosal visualization was also discussed.  No guarantees or warranties were given. Patient at increased risk for cardiopulmonary complications of procedure due to medical comorbidities.   Titianna Loomis L Danis III Office:[phone]    

## 2023-04-10 NOTE — ED Notes (Signed)
Pt ambulated to the restroom with a walker and assistance from daughter.

## 2023-04-11 ENCOUNTER — Encounter (HOSPITAL_COMMUNITY)
Admission: EM | Disposition: A | Discharge status: Home / Self Care | Payer: Self-pay | Source: Home / Self Care | Attending: Family Medicine

## 2023-04-11 ENCOUNTER — Observation Stay (HOSPITAL_BASED_OUTPATIENT_CLINIC_OR_DEPARTMENT_OTHER): Payer: PPO | Admitting: Anesthesiology

## 2023-04-11 ENCOUNTER — Observation Stay (HOSPITAL_COMMUNITY): Payer: PPO | Admitting: Anesthesiology

## 2023-04-11 ENCOUNTER — Encounter (HOSPITAL_COMMUNITY): Payer: Self-pay | Admitting: Internal Medicine

## 2023-04-11 DIAGNOSIS — E119 Type 2 diabetes mellitus without complications: Secondary | ICD-10-CM | POA: Diagnosis present

## 2023-04-11 DIAGNOSIS — R1319 Other dysphagia: Secondary | ICD-10-CM | POA: Diagnosis not present

## 2023-04-11 DIAGNOSIS — E039 Hypothyroidism, unspecified: Secondary | ICD-10-CM | POA: Diagnosis present

## 2023-04-11 DIAGNOSIS — E860 Dehydration: Secondary | ICD-10-CM | POA: Diagnosis present

## 2023-04-11 DIAGNOSIS — Z87891 Personal history of nicotine dependence: Secondary | ICD-10-CM

## 2023-04-11 DIAGNOSIS — I509 Heart failure, unspecified: Secondary | ICD-10-CM | POA: Diagnosis not present

## 2023-04-11 DIAGNOSIS — R1314 Dysphagia, pharyngoesophageal phase: Secondary | ICD-10-CM | POA: Diagnosis present

## 2023-04-11 DIAGNOSIS — N400 Enlarged prostate without lower urinary tract symptoms: Secondary | ICD-10-CM | POA: Diagnosis present

## 2023-04-11 DIAGNOSIS — K449 Diaphragmatic hernia without obstruction or gangrene: Secondary | ICD-10-CM

## 2023-04-11 DIAGNOSIS — K2100 Gastro-esophageal reflux disease with esophagitis, without bleeding: Secondary | ICD-10-CM

## 2023-04-11 DIAGNOSIS — Z8719 Personal history of other diseases of the digestive system: Secondary | ICD-10-CM | POA: Diagnosis not present

## 2023-04-11 DIAGNOSIS — K920 Hematemesis: Secondary | ICD-10-CM | POA: Diagnosis present

## 2023-04-11 DIAGNOSIS — I5022 Chronic systolic (congestive) heart failure: Secondary | ICD-10-CM | POA: Diagnosis present

## 2023-04-11 DIAGNOSIS — I110 Hypertensive heart disease with heart failure: Secondary | ICD-10-CM

## 2023-04-11 DIAGNOSIS — D62 Acute posthemorrhagic anemia: Secondary | ICD-10-CM

## 2023-04-11 DIAGNOSIS — K209 Esophagitis, unspecified without bleeding: Secondary | ICD-10-CM | POA: Diagnosis not present

## 2023-04-11 DIAGNOSIS — I679 Cerebrovascular disease, unspecified: Secondary | ICD-10-CM

## 2023-04-11 DIAGNOSIS — R1310 Dysphagia, unspecified: Secondary | ICD-10-CM

## 2023-04-11 DIAGNOSIS — Q438 Other specified congenital malformations of intestine: Secondary | ICD-10-CM | POA: Diagnosis not present

## 2023-04-11 DIAGNOSIS — E876 Hypokalemia: Secondary | ICD-10-CM | POA: Diagnosis present

## 2023-04-11 DIAGNOSIS — Z6824 Body mass index (BMI) 24.0-24.9, adult: Secondary | ICD-10-CM | POA: Diagnosis not present

## 2023-04-11 DIAGNOSIS — Z79890 Hormone replacement therapy (postmenopausal): Secondary | ICD-10-CM | POA: Diagnosis not present

## 2023-04-11 DIAGNOSIS — Z8711 Personal history of peptic ulcer disease: Secondary | ICD-10-CM | POA: Diagnosis not present

## 2023-04-11 DIAGNOSIS — K5900 Constipation, unspecified: Secondary | ICD-10-CM | POA: Diagnosis present

## 2023-04-11 DIAGNOSIS — E785 Hyperlipidemia, unspecified: Secondary | ICD-10-CM | POA: Diagnosis present

## 2023-04-11 DIAGNOSIS — I2510 Atherosclerotic heart disease of native coronary artery without angina pectoris: Secondary | ICD-10-CM

## 2023-04-11 DIAGNOSIS — E8809 Other disorders of plasma-protein metabolism, not elsewhere classified: Secondary | ICD-10-CM | POA: Diagnosis present

## 2023-04-11 DIAGNOSIS — Z8673 Personal history of transient ischemic attack (TIA), and cerebral infarction without residual deficits: Secondary | ICD-10-CM | POA: Diagnosis not present

## 2023-04-11 DIAGNOSIS — R634 Abnormal weight loss: Secondary | ICD-10-CM | POA: Diagnosis present

## 2023-04-11 DIAGNOSIS — Z96652 Presence of left artificial knee joint: Secondary | ICD-10-CM | POA: Diagnosis present

## 2023-04-11 DIAGNOSIS — K2101 Gastro-esophageal reflux disease with esophagitis, with bleeding: Secondary | ICD-10-CM | POA: Diagnosis present

## 2023-04-11 DIAGNOSIS — Z7902 Long term (current) use of antithrombotics/antiplatelets: Secondary | ICD-10-CM | POA: Diagnosis not present

## 2023-04-11 DIAGNOSIS — J449 Chronic obstructive pulmonary disease, unspecified: Secondary | ICD-10-CM

## 2023-04-11 HISTORY — PX: ESOPHAGOGASTRODUODENOSCOPY (EGD) WITH PROPOFOL: SHX5813

## 2023-04-11 HISTORY — DX: Cerebrovascular disease, unspecified: I67.9

## 2023-04-11 HISTORY — DX: Diaphragmatic hernia without obstruction or gangrene: K44.9

## 2023-04-11 HISTORY — DX: Acute posthemorrhagic anemia: D62

## 2023-04-11 HISTORY — DX: Chronic obstructive pulmonary disease, unspecified: J44.9

## 2023-04-11 LAB — BASIC METABOLIC PANEL
Anion gap: 12 (ref 5–15)
BUN: 12 mg/dL (ref 8–23)
CO2: 23 mmol/L (ref 22–32)
Calcium: 8.2 mg/dL — ABNORMAL LOW (ref 8.9–10.3)
Chloride: 103 mmol/L (ref 98–111)
Creatinine, Ser: 0.89 mg/dL (ref 0.61–1.24)
GFR, Estimated: >60 mL/min (ref >60)
Glucose, Bld: 95 mg/dL (ref 70–99)
Potassium: 2.9 mmol/L — ABNORMAL LOW (ref 3.5–5.1)
Sodium: 138 mmol/L (ref 135–145)

## 2023-04-11 LAB — CBC
HCT: 32.4 % — ABNORMAL LOW (ref 39.0–52.0)
Hemoglobin: 10.6 g/dL — ABNORMAL LOW (ref 13.0–17.0)
MCH: 30.1 pg (ref 26.0–34.0)
MCHC: 32.7 g/dL (ref 30.0–36.0)
MCV: 92 fL (ref 80.0–100.0)
Platelets: 308 10*3/uL (ref 150–400)
RBC: 3.52 MIL/uL — ABNORMAL LOW (ref 4.22–5.81)
RDW: 14.2 % (ref 11.5–15.5)
WBC: 9.6 10*3/uL (ref 4.0–10.5)
nRBC: 0 % (ref 0.0–0.2)

## 2023-04-11 LAB — POCT I-STAT, CHEM 8
BUN: 13 mg/dL (ref 8–23)
Calcium, Ion: 1.16 mmol/L (ref 1.15–1.40)
Chloride: 102 mmol/L (ref 98–111)
Creatinine, Ser: 0.7 mg/dL (ref 0.61–1.24)
Glucose, Bld: 92 mg/dL (ref 70–99)
HCT: 34 % — ABNORMAL LOW (ref 39.0–52.0)
Hemoglobin: 11.6 g/dL — ABNORMAL LOW (ref 13.0–17.0)
Potassium: 3.3 mmol/L — ABNORMAL LOW (ref 3.5–5.1)
Sodium: 141 mmol/L (ref 135–145)
TCO2: 24 mmol/L (ref 22–32)

## 2023-04-11 LAB — MAGNESIUM: Magnesium: 1.8 mg/dL (ref 1.7–2.4)

## 2023-04-11 LAB — GLUCOSE, CAPILLARY
Glucose-Capillary: 141 mg/dL — ABNORMAL HIGH (ref 70–99)
Glucose-Capillary: 131 mg/dL — ABNORMAL HIGH (ref 70–99)

## 2023-04-11 LAB — SURGICAL PCR SCREEN
MRSA, PCR: NEGATIVE
Staphylococcus aureus: NEGATIVE

## 2023-04-11 SURGERY — ESOPHAGOGASTRODUODENOSCOPY (EGD) WITH PROPOFOL
Anesthesia: Monitor Anesthesia Care

## 2023-04-11 MED ORDER — FLUTICASONE PROPIONATE 50 MCG/ACT NA SUSP: 1.0000 | NASAL | Status: DC | PRN

## 2023-04-11 MED ORDER — INSULIN ASPART 100 UNIT/ML IJ SOLN: 0.0000 [IU] | Freq: Every day | INTRAMUSCULAR | Status: DC

## 2023-04-11 MED ORDER — SODIUM CHLORIDE 0.9 % IV SOLN: INTRAVENOUS | Status: DC

## 2023-04-11 MED ORDER — MONTELUKAST SODIUM 10 MG PO TABS
10.0000 mg | ORAL_TABLET | Freq: Every day | ORAL | Status: DC
  Administered 2023-04-11 – 2023-04-12 (×2): 10 mg via ORAL
  Filled 2023-04-11 (×2): qty 1

## 2023-04-11 MED ORDER — MIRABEGRON ER 25 MG PO TB24
25.0000 mg | ORAL_TABLET | Freq: Every day | ORAL | Status: DC
  Administered 2023-04-11 – 2023-04-12 (×2): 25 mg via ORAL
  Filled 2023-04-11 (×2): qty 1

## 2023-04-11 MED ORDER — LACTATED RINGERS IV SOLN: INTRAVENOUS | Status: DC

## 2023-04-11 MED ORDER — FLUTICASONE FUROATE-VILANTEROL 100-25 MCG/ACT IN AEPB
1.0000 | INHALATION_SPRAY | Freq: Every day | RESPIRATORY_TRACT | Status: DC
  Administered 2023-04-12: 1 via RESPIRATORY_TRACT
  Filled 2023-04-11: qty 28

## 2023-04-11 MED ORDER — UMECLIDINIUM BROMIDE 62.5 MCG/ACT IN AEPB
1.0000 | INHALATION_SPRAY | Freq: Every day | RESPIRATORY_TRACT | Status: DC
  Administered 2023-04-12: 1 via RESPIRATORY_TRACT
  Filled 2023-04-11: qty 7

## 2023-04-11 MED ORDER — LEVOTHYROXINE SODIUM 100 MCG PO TABS
100.0000 ug | ORAL_TABLET | Freq: Every day | ORAL | Status: DC
  Administered 2023-04-12: 100 ug via ORAL
  Filled 2023-04-11: qty 1

## 2023-04-11 MED ORDER — LIDOCAINE HCL (CARDIAC) PF 100 MG/5ML IV SOSY
PREFILLED_SYRINGE | INTRAVENOUS | Status: DC | PRN
  Administered 2023-04-11: 60 mg via INTRAVENOUS

## 2023-04-11 MED ORDER — DAPAGLIFLOZIN PROPANEDIOL 10 MG PO TABS
10.0000 mg | ORAL_TABLET | Freq: Every day | ORAL | Status: DC
  Administered 2023-04-12: 10 mg via ORAL
  Filled 2023-04-11: qty 1

## 2023-04-11 MED ORDER — FENOFIBRATE 160 MG PO TABS
160.0000 mg | ORAL_TABLET | Freq: Every day | ORAL | Status: DC
  Administered 2023-04-11 – 2023-04-12 (×2): 160 mg via ORAL
  Filled 2023-04-11 (×2): qty 1

## 2023-04-11 MED ORDER — MAGNESIUM SULFATE 2 GM/50ML IV SOLN
2.0000 g | Freq: Once | INTRAVENOUS | Status: AC
  Administered 2023-04-11: 2 g via INTRAVENOUS
  Filled 2023-04-11: qty 50

## 2023-04-11 MED ORDER — LORATADINE 10 MG PO TABS
10.0000 mg | ORAL_TABLET | Freq: Every day | ORAL | Status: DC
  Administered 2023-04-11 – 2023-04-12 (×2): 10 mg via ORAL
  Filled 2023-04-11 (×2): qty 1

## 2023-04-11 MED ORDER — ATORVASTATIN CALCIUM 80 MG PO TABS
80.0000 mg | ORAL_TABLET | Freq: Every day | ORAL | Status: DC
  Administered 2023-04-11 – 2023-04-12 (×2): 80 mg via ORAL
  Filled 2023-04-11 (×2): qty 1

## 2023-04-11 MED ORDER — PANTOPRAZOLE SODIUM 40 MG PO TBEC
40.0000 mg | DELAYED_RELEASE_TABLET | Freq: Two times a day (BID) | ORAL | Status: DC
  Administered 2023-04-11 – 2023-04-12 (×2): 40 mg via ORAL
  Filled 2023-04-11 (×2): qty 1

## 2023-04-11 MED ORDER — PROPOFOL 500 MG/50ML IV EMUL
INTRAVENOUS | Status: DC | PRN
  Administered 2023-04-11: 150 ug/kg/min via INTRAVENOUS

## 2023-04-11 MED ORDER — POTASSIUM CHLORIDE 10 MEQ/100ML IV SOLN
10.0000 meq | INTRAVENOUS | Status: AC
  Administered 2023-04-11 (×6): 10 meq via INTRAVENOUS
  Filled 2023-04-11 (×6): qty 100

## 2023-04-11 MED ORDER — INSULIN ASPART 100 UNIT/ML IJ SOLN
0.0000 [IU] | Freq: Three times a day (TID) | INTRAMUSCULAR | Status: DC
  Administered 2023-04-11: 2 [IU] via SUBCUTANEOUS

## 2023-04-11 SURGICAL SUPPLY — 15 items

## 2023-04-11 NOTE — Anesthesia Preprocedure Evaluation (Signed)
Anesthesia Evaluation  Patient identified by MRN, date of birth, ID band Patient awake    Reviewed: Allergy & Precautions, H&P , NPO status , Patient's Chart, lab work & pertinent test results, reviewed documented beta blocker date and time   History of Anesthesia Complications Negative for: history of anesthetic complications  Airway Mallampati: III  TM Distance: >3 FB Neck ROM: Full    Dental  (+) Edentulous Upper   Pulmonary former smoker Quit smoking 1984, 90 pack year history    Pulmonary exam normal breath sounds clear to auscultation       Cardiovascular hypertension (125/74 preop), Pt. on medications and Pt. on home beta blockers + CAD and +CHF (LVEF 35-40%, grade 1 diastolic dysfunction)  Normal cardiovascular exam+ Valvular Problems/Murmurs (mild MR) MR  Rhythm:Regular Rate:Normal  Echo 01/2023  1. GLS -11.2. Left ventricular ejection fraction, by estimation, is 35 to  40%. The left ventricle has moderately decreased function. The left  ventricle demonstrates regional wall motion abnormalities (see scoring  diagram/findings for description). There   is mild left ventricular hypertrophy. Left ventricular diastolic  parameters are consistent with Grade I diastolic dysfunction (impaired  relaxation). There is severe akinesis of the left ventricular, mid-apical  anteroseptal wall.   2. Right ventricular systolic function is normal. The right ventricular  size is normal. There is normal pulmonary artery systolic pressure.   3. Left atrial size was mildly dilated.   4. The mitral valve is normal in structure. Mild mitral valve  regurgitation. No evidence of mitral stenosis.   5. The aortic valve is calcified. There is mild calcification of the  aortic valve. There is mild thickening of the aortic valve. Aortic valve  regurgitation is trivial. No aortic stenosis is present.   6. The inferior vena cava is normal in size with  greater than 50%  respiratory variability, suggesting right atrial pressure of 3 mmHg.     Neuro/Psych TIA (2016) negative psych ROS   GI/Hepatic Neg liver ROS,GERD  Controlled and Medicated,,Hematemesis, last episode 4d ago   Endo/Other  diabetes, Well Controlled, Type 2, Oral Hypoglycemic AgentsHypothyroidism  K 2.9 yesterday- this AM:3.3 FS 92 this AM  Renal/GU negative Renal ROS  negative genitourinary   Musculoskeletal  (+) Arthritis , Osteoarthritis,    Abdominal   Peds negative pediatric ROS (+)  Hematology  (+) Blood dyscrasia, anemia Hb 10.8   Anesthesia Other Findings   Reproductive/Obstetrics negative OB ROS                             Anesthesia Physical Anesthesia Plan  ASA: 3  Anesthesia Plan: MAC   Post-op Pain Management:    Induction:   PONV Risk Score and Plan: 2 and Propofol infusion and TIVA  Airway Management Planned: Natural Airway and Simple Face Mask  Additional Equipment: None  Intra-op Plan:   Post-operative Plan:   Informed Consent: I have reviewed the patients History and Physical, chart, labs and discussed the procedure including the risks, benefits and alternatives for the proposed anesthesia with the patient or authorized representative who has indicated his/her understanding and acceptance.       Plan Discussed with: CRNA  Anesthesia Plan Comments:         Anesthesia Quick Evaluation

## 2023-04-11 NOTE — Op Note (Signed)
[facility] Patient Name: [name] Procedure Date : 04/11/2023 MRN: [id number] Attending MD: Starr Lake. Myrtie Neither , MD, [id number] Date of Birth: 12-28-1945 CSN: [id number] Age: 77 Admit Type: Inpatient Procedure:                Upper GI endoscopy Indications:              Acute post hemorrhagic anemia, , Esophageal                            dysphagia, Heartburn, Hematemesis, Abnormal CT of                            the GI tract, Nausea with vomiting, Weight loss                           See inpatient GI consult note on 04/10/2023 for                            clinical details. Of note, fundoplication 20 years                            ago. Hiatal hernia seen on patient's CT. Providers:                Starr Lake. Myrtie Neither, MD, Carlena Hurl, Faustina                            Mbumina, Technician Referring MD:             [facility] hospitalist Medicines:                Monitored Anesthesia Care Complications:            No immediate complications. Estimated Blood Loss:     Estimated blood loss: none. Procedure:                Pre-Anesthesia Assessment:                           - Prior to the procedure, a History and Physical                            was performed, and patient medications and                            allergies were reviewed. The patient's tolerance of                            previous anesthesia was also reviewed. The risks                            and benefits of the procedure and the sedation                            options and risks were discussed with the patient.  All questions were answered, and informed consent                            was obtained. Prior Anticoagulants: The patient has                            taken Plavix (clopidogrel), last dose was 2 days                            prior to procedure. ASA Grade Assessment: III - A                            patient with severe systemic disease. After                             reviewing the risks and benefits, the patient was                            deemed in satisfactory condition to undergo the                            procedure.                           After obtaining informed consent, the endoscope was                            passed under direct vision. Throughout the                            procedure, the patient's blood pressure, pulse, and                            oxygen saturations were monitored continuously. The                            [id number]-[id number] ([id number]) Olympus endoscope was introduced                            through the mouth, and advanced to the second part                            of duodenum. The upper GI endoscopy was                            accomplished without difficulty. The patient                            tolerated the procedure well. Scope In: Scope Out: Findings:      The larynx was normal.      An approximately 5-7 cm hiatal hernia was present. It is causing       foreshortening of the esophagus as well as some tortuosity. This is       exacerbated by the fact that  the part of the stomach that has previously       undergone fundoplication is herniated.      LA Grade D (one or more mucosal breaks involving at least 75% of       esophageal circumference) esophagitis with no bleeding was found 25 to       35 cm from the incisors. Complete circumferentially denuded mucosa       throughout this 10 cm length to the EG junction. No focal stricture seen       amenable to endoscopic dilation.      The stomach was normal.      The cardia and gastric fundus were normal on retroflexion other than the       expected anatomic changes of fundoplication and hiatal hernia. Scope       passes easily through a widely patent pylorus, and the stomach distended       well with insufflation.      The examined duodenum was normal. Impression:               - Normal larynx.                           - 5 cm  hiatal hernia.                           - LA Grade D reflux esophagitis with no bleeding.                           - Normal stomach.                           - Normal examined duodenum.                           - No specimens collected.                           The dysphagia is due to the anatomic distortion of                            herniated proximal stomach with previous                            fundoplication. Unknown if there is superimposed                            esophageal dysmotility that may be related to                            severe reflux esophagitis. Recommendation:           - Return patient to hospital ward for ongoing care.                           - Soft diet.                           - Use Protonix (pantoprazole) 40 mg PO BID.                           -  Cut and chew food well, plenty of liquids with                            meals, do not eat within 3 to 4 hours of bedtime.                           Keep head of bed elevated at least 30 degrees while                            sleeping.                           Resume Plavix in 1 week.                           Clinic follow-up with our office will be arranged.                           If patient clinically stable with further                            observation until tomorrow and tolerating a diet                            without vomiting or recurrence of bleeding, he can                            be discharged home and we will arrange outpatient                            follow-up in my office.                           Report given to patient                           Family updated by phone. Procedure Code(s):        --- Professional ---                           [phone], Esophagogastroduodenoscopy, flexible,                            transoral; diagnostic, including collection of                            specimen(s) by brushing or washing, when performed                             (separate procedure) Diagnosis Code(s):        --- Professional ---                           K44.9, Diaphragmatic hernia without obstruction or  gangrene                           K21.00, Gastro-esophageal reflux disease with                            esophagitis, without bleeding                           D62, Acute posthemorrhagic anemia                           R13.14, Dysphagia, pharyngoesophageal phase                           R12, Heartburn                           K92.0, Hematemesis                           R11.2, Nausea with vomiting, unspecified                           R63.4, Abnormal weight loss                           R93.3, Abnormal findings on diagnostic imaging of                            other parts of digestive tract CPT copyright 2022 [employer]. All rights reserved. The codes documented in this report are preliminary and upon coder review may  be revised to meet current compliance requirements. Ajdin Macke L. Myrtie Neither, MD 04/11/2023 8:49:09 AM This report has been signed electronically. Number of Addenda: 0

## 2023-04-11 NOTE — Assessment & Plan Note (Signed)
BP controlled - Hold clonidine, Entresto, spironolactone - Continue labetalol

## 2023-04-11 NOTE — Assessment & Plan Note (Signed)
-   Start SS corrections

## 2023-04-11 NOTE — Assessment & Plan Note (Signed)
Continue levothyroxine 

## 2023-04-11 NOTE — Assessment & Plan Note (Signed)
-  Continue atorvastatin, fenofibrate 

## 2023-04-11 NOTE — Assessment & Plan Note (Signed)
Hgb 13 on admission, down to 10.6 today.  Stable overnight, no further melena or hematemesis.  Likely source esophagitis. - Hold aspirin, Plavix

## 2023-04-11 NOTE — Interval H&P Note (Signed)
History and Physical Interval Note:  04/11/2023 8:13 AM  [name]  has presented today for surgery, with the diagnosis of Vomiting blood.  The various methods of treatment have been discussed with the patient and family. After consideration of risks, benefits and other options for treatment, the patient has consented to  Procedure(s): ESOPHAGOGASTRODUODENOSCOPY (EGD) WITH PROPOFOL (N/A) as a surgical intervention.  The patient's history has been reviewed, patient examined, no change in status, stable for surgery.  I have reviewed the patient's chart and labs.  Questions were answered to the patient's satisfaction.     [name]

## 2023-04-11 NOTE — Progress Notes (Addendum)
Progress Note   Patient: [name] AVW:[id number] DOB: 1946/04/23 DOA: 04/09/2023     0 DOS: the patient was seen and examined on 04/11/2023 at 12:15PM      Brief hospital course: Mr. Torgerson is a 77 y.o. M with HTN, DM, HLD, hypothyroidism, hx CVA, sCHF EF 35-40%, and hiatal hernia s/p Nissen 2010 who presented with few months reduced appetite and heart burn, now 1 week vomiting and unable to take anything by mouth due to vomiting/anorexia/pain with swallowing.  Finally had hematemesis, so family able to convince him to come to the hospital.  In the ER, CT showed esophageal thickening and hiatal hernia.  GI consulted and he was admitted on IV PPI.     Assessment and Plan: * Hematemesis due to hiatal hernia/esophagitis    GERD with esophagitis    Odynophagia Admitted on PPI.  EGD today showed a 5-7cm hiatal hernia with part of fundoplicated stomach now herniated and notable tortuosity of esophagus, as well as LA Grade D esophagitis and complete circumferentially denuded mucosa in this 10cm length above the GE junction.   - Soft diet - Protonix BID - Cut and chew food well, plenty of liquids with meals, no food within 4 hours of bedtime and keep head of bed elevated 30 degrees while sleeping - Hold Plavix until 5/23 - Follow up with GI in office     COPD (chronic obstructive pulmonary disease) (HCC) No evidence of flare - Continue ICS/LABA/LAMA - Continue singulair, claritin, nasal steroid  Cerebrovascular disease - Continue statin and fibrate - Hold aspirin and Plavix - Hold Plavix 1 week  Acute blood loss anemia Hgb 13 on admission, down to 10.6 today.  Stable overnight, no further melena or hematemesis.  Likely source esophagitis. - Hold aspirin, Plavix  Heart failure with reduced ejection fraction (HCC) Appears euvolemic to dehydrated Not on diuretic at baseline EF 35-40% - Continue Farxiga - Hold Entresto, spironolactone  Hypokalemia Mildly low, K down to  2.9 today. - Supplement K  Controlled type 2 diabetes mellitus without complication, without long-term current use of insulin (HCC) - Start SS corrections  Essential hypertension BP controlled - Hold clonidine, Entresto, spironolactone - Continue labetalol  Hyperlipidemia - Continue atorvastatin, fenofibrate  Hypothyroidism - Continue levothyroxine  Possible pneumonia CXR with interstitial change, mild pneumonia vs chronic bronchitis.  No symptoms localizable to pneumonia really.  Procal negative - Continue Rocephin - Follow procal         Subjective: Patient sleepy, just came back from EGD, tolerated cottage cheese, potato soup, fluids without pain or vomiting.  No more hematemesis or vomiting or melena.     Physical Exam: BP (!) 100/55 (BP Location: Left Arm)   Pulse 72   Temp 98.6 F (37 C)   Resp 18   Ht 5' 11.5" (1.816 m)   Wt 79.4 kg   SpO2 94%   BMI 24.07 kg/m   Elderly adult male, lying in bed, sleepy, moves all extremities with generalized weakness but symmetric strength Speech fluent but very somnolent due to sedation postprocedure Abdomen no distention Skin relatively pale    Data Reviewed: EGD report reviewed, summarized above CT abdomen pelvis report reviewed, summarized above Potassium stable at 2.9 Hemoglobin down to 10.6 Procalcitonin negative TSH normal    Family Communication: Daughters and wife at the bedside    Disposition: Status is: Observation Given the patient's poor oral intake over the last week, he will need to be observed overnight to confirm/verify his ability  to take PO        Author: Alberteen Sam, MD 04/11/2023 1:44 PM  For on call review [email or web address].

## 2023-04-11 NOTE — Assessment & Plan Note (Signed)
Appears euvolemic to dehydrated Not on diuretic at baseline EF 35-40% - Continue Farxiga - Hold Entresto, spironolactone

## 2023-04-11 NOTE — Anesthesia Postprocedure Evaluation (Signed)
Anesthesia Post Note  Patient: [name]  Procedure(s) Performed: ESOPHAGOGASTRODUODENOSCOPY (EGD) WITH PROPOFOL     Patient location during evaluation: PACU Anesthesia Type: MAC Level of consciousness: awake and alert Pain management: pain level controlled Vital Signs Assessment: post-procedure vital signs reviewed and stable Respiratory status: spontaneous breathing, nonlabored ventilation and respiratory function stable Cardiovascular status: blood pressure returned to baseline and stable Postop Assessment: no apparent nausea or vomiting Anesthetic complications: no   No notable events documented.  Last Vitals:  Vitals:   04/11/23 0857 04/11/23 0917  BP: (!) 108/98 136/71  Pulse: 71 66  Resp: (!) 22 18  Temp:  36.9 C  SpO2: 94% 94%    Last Pain:  Vitals:   04/11/23 0857  TempSrc:   PainSc: 0-No pain                 Lannie Fields

## 2023-04-11 NOTE — Transfer of Care (Signed)
Immediate Anesthesia Transfer of Care Note  Patient: [name]  Procedure(s) Performed: ESOPHAGOGASTRODUODENOSCOPY (EGD) WITH PROPOFOL  Patient Location: Endoscopy Unit  Anesthesia Type:MAC  Level of Consciousness: drowsy  Airway & Oxygen Therapy: Patient Spontanous Breathing and Patient connected to nasal cannula oxygen  Post-op Assessment: Report given to RN and Post -op Vital signs reviewed and stable  Post vital signs: Reviewed and stable  Last Vitals:  Vitals Value Taken Time  BP 115/63 04/11/23 0836  Temp    Pulse 67 04/11/23 0837  Resp 24 04/11/23 0837  SpO2 94 % 04/11/23 0837  Vitals shown include unvalidated device data.  Last Pain:  Vitals:   04/11/23 0742  TempSrc: Tympanic  PainSc: 0-No pain         Complications: No notable events documented.

## 2023-04-11 NOTE — Assessment & Plan Note (Addendum)
No evidence of flare - Continue ICS/LABA/LAMA - Continue singulair, claritin, nasal steroid

## 2023-04-11 NOTE — Assessment & Plan Note (Signed)
Admitted on PPI.  EGD today showed a 5-7cm hiatal hernia with part of fundoplicated stomach now herniated and notable tortuosity of esophagus, as well as LA Grade D esophagitis and complete circumferentially denuded mucosa in this 10cm length above the GE junction.   - Soft diet - Protonix BID - Cut and chew food well, plenty of liquids with meals, no food within 4 hours of bedtime and keep head of bed elevated 30 degrees while sleeping - Hold Plavix until 5/23 - Follow up with GI in office

## 2023-04-11 NOTE — Hospital Course (Signed)
Mr. Lawrimore is a 77 y.o. M with HTN, DM, HLD, hypothyroidism, hx CVA, sCHF EF 35-40%, and hiatal hernia s/p Nissen 2010 who presented with few months reduced appetite and heart burn, now 1 week vomiting and unable to take anything by mouth due to vomiting/anorexia/pain with swallowing.  Finally had hematemesis, so family able to convince him to come to the hospital.  In the ER, CT showed esophageal thickening and hiatal hernia.  GI consulted and he was admitted on IV PPI.

## 2023-04-11 NOTE — Assessment & Plan Note (Signed)
-   Continue statin and fibrate - Hold aspirin and Plavix - Hold Plavix 1 week

## 2023-04-11 NOTE — Anesthesia Procedure Notes (Signed)
Procedure Name: MAC Date/Time: 04/11/2023 8:25 AM  Performed by: Caren Macadam, CRNAPre-anesthesia Checklist: Patient identified Patient Re-evaluated:Patient Re-evaluated prior to induction Oxygen Delivery Method: Nasal cannula Preoxygenation: Pre-oxygenation with 100% oxygen Induction Type: IV induction

## 2023-04-11 NOTE — Assessment & Plan Note (Addendum)
Mildly low, K down to 2.9 today. - Supplement K

## 2023-04-12 ENCOUNTER — Other Ambulatory Visit (HOSPITAL_COMMUNITY): Payer: Self-pay

## 2023-04-12 DIAGNOSIS — J449 Chronic obstructive pulmonary disease, unspecified: Secondary | ICD-10-CM | POA: Diagnosis not present

## 2023-04-12 DIAGNOSIS — K920 Hematemesis: Secondary | ICD-10-CM | POA: Diagnosis not present

## 2023-04-12 DIAGNOSIS — R1310 Dysphagia, unspecified: Secondary | ICD-10-CM | POA: Diagnosis not present

## 2023-04-12 DIAGNOSIS — K2100 Gastro-esophageal reflux disease with esophagitis, without bleeding: Secondary | ICD-10-CM | POA: Diagnosis not present

## 2023-04-12 LAB — COMPREHENSIVE METABOLIC PANEL
ALT: 23 U/L (ref 0–44)
AST: 28 U/L (ref 15–41)
Albumin: 2.5 g/dL — ABNORMAL LOW (ref 3.5–5.0)
Alkaline Phosphatase: 43 U/L (ref 38–126)
Anion gap: 9 (ref 5–15)
BUN: 10 mg/dL (ref 8–23)
CO2: 24 mmol/L (ref 22–32)
Calcium: 8.3 mg/dL — ABNORMAL LOW (ref 8.9–10.3)
Chloride: 105 mmol/L (ref 98–111)
Creatinine, Ser: 0.88 mg/dL (ref 0.61–1.24)
GFR, Estimated: >60 mL/min (ref >60)
Glucose, Bld: 107 mg/dL — ABNORMAL HIGH (ref 70–99)
Potassium: 3.3 mmol/L — ABNORMAL LOW (ref 3.5–5.1)
Sodium: 138 mmol/L (ref 135–145)
Total Bilirubin: 0.6 mg/dL (ref 0.3–1.2)
Total Protein: 5.8 g/dL — ABNORMAL LOW (ref 6.5–8.1)

## 2023-04-12 LAB — PROCALCITONIN: Procalcitonin: <0.1 ng/mL

## 2023-04-12 LAB — GLUCOSE, CAPILLARY: Glucose-Capillary: 104 mg/dL — ABNORMAL HIGH (ref 70–99)

## 2023-04-12 MED ORDER — CEFDINIR 300 MG PO CAPS
300.0000 mg | ORAL_CAPSULE | Freq: Two times a day (BID) | ORAL | 0 refills | Status: DC
  Filled 2023-04-12: qty 6, 3d supply, fill #0

## 2023-04-12 MED ORDER — POTASSIUM CHLORIDE 20 MEQ PO PACK
40.0000 meq | PACK | Freq: Once | ORAL | Status: AC
  Administered 2023-04-12: 40 meq via ORAL
  Filled 2023-04-12: qty 2

## 2023-04-12 MED ORDER — PANTOPRAZOLE SODIUM 40 MG PO TBEC
40.0000 mg | DELAYED_RELEASE_TABLET | Freq: Two times a day (BID) | ORAL | 3 refills | Status: AC
  Filled 2023-04-12 – 2023-05-31 (×3): qty 60, 30d supply, fill #0

## 2023-04-12 NOTE — TOC Transition Note (Signed)
Transition of Care [facility]) - CM/SW Discharge Note   Patient Details  Name: [name] MRN: [id number] Date of Birth: 1946/08/31  [facility] CM/SW Contact:  Janae Bridgeman, RN Phone Number: 04/12/2023, 11:02 AM   Clinical Narrative:     Transition of Care [facility]) Screening Note   Patient Details  Name: [name] Date of Birth: [birth year]   Transition of Care [facility]) CM/SW Contact:    Janae Bridgeman, RN Phone Number: 04/12/2023, 11:02 AM    Transition of Care Department [facility]) has reviewed patient and no TOC needs have been identified at this time. We will continue to monitor patient advancement through interdisciplinary progression rounds. If new patient transition needs arise, please place a TOC consult.           Patient Goals and CMS Choice      Discharge Placement                         Discharge Plan and Services Additional resources added to the After Visit Summary for                                       Social Determinants of Health (SDOH) Interventions SDOH Screenings   Food Insecurity: No Food Insecurity (04/10/2023)  Housing: Low Risk  (04/10/2023)  Transportation Needs: No Transportation Needs (04/10/2023)  Utilities: Not At Risk (04/10/2023)  Depression (PHQ2-9): Low Risk  (08/31/2019)  Tobacco Use: Medium Risk (04/11/2023)     Readmission Risk Interventions     No data to display

## 2023-04-12 NOTE — Discharge Summary (Signed)
Physician Discharge Summary   Patient: [name] MRN: [id number] DOB: 1946-01-18  Admit date:     04/09/2023  Discharge date: 04/12/23  Discharge Physician: [name]   PCP: Steven Blase, PA-C     Recommendations at discharge:  Follow up with gastroenterology [name] for severe esophagitis and hiatal hernia Follow up with PCP [name] and Cardiology Dr. Tomie [name] re: resumption of Plavix (is needed this far out from stroke?) [name]: Please check HGb at follow up     Discharge Diagnoses: Principal Problem:   Hematemesis due to hiatal hernia/esophagitis Active Problems:   GERD with esophagitis   Odynophagia   Hypothyroidism   Hyperlipidemia   Essential hypertension   Controlled type 2 diabetes mellitus without complication, without long-term current use of insulin (HCC)   Hypokalemia   Chronic systolic Congestive heart failure   Acute blood loss anemia   Cerebrovascular disease   COPD (chronic obstructive pulmonary disease) [facility])      Hospital Course: [name] is a 77 y.o. M with HTN, DM, HLD, hypothyroidism, hx CVA, sCHF EF 35-40%, and hiatal hernia s/p Nissen 2010 who presented with few months reduced appetite and heart burn, now 1 week vomiting and unable to take anything by mouth due to vomiting/anorexia/pain with swallowing.  Finally had hematemesis, so family able to convince him to come to the hospital.  In the ER, CT showed esophageal thickening and hiatal hernia.  GI consulted and he was admitted on IV PPI.   * Hematemesis due to hiatal hernia/esophagitis GERD with erosive esophagitis Anorexia/Pain with swallowing due to esophagitis Admitted on PPI.    GI consulted and underwent EGD that showed a 5-7cm hiatal hernia with part of fundoplicated stomach now herniated and notable tortuosity of esophagus, as well as LA Grade D esophagitis and complete circumferentially denuded mucosa in this 10cm length above the GE junction.     Observed overnight and tolerated supper and breakfast okay.    GI recommend: - Protonix BID indefinitely - Avoid dry meat and bread - Make sure food is cut well, chewed well - Plenty of liquids with meals - No food within 4 hours of bedtime  - Keep head of bed elevated 30 degrees while sleeping  GI follow up arranged.  Reduce aspirin to 81 mg daily  Plavix held until at least 5/23.  Consider stopping Plavix given soft indication this far from stroke.       Acute blood loss anemia Hgb 13 on admission, down to 10.6 in the hospital.  - Trend Hgb after discharge.  Heart failure with reduced ejection fraction (HCC) Appeared not fluid overloaded. Not on diuretic at baseline EF 35-40%  Hypokalemia Supplemented.            The [facility] Controlled Substances Registry was reviewed for this patient prior to discharge.  Consultants: Gastroenterology [name] [name] Procedures performed:  CT abdomen and pelvis with contrst EGD   Disposition: Home Diet recommendation:  Cardiac diet  DISCHARGE MEDICATION: Allergies as of 04/12/2023       Reactions   Latex Rash, Other (See Comments)   Skin gets "blood raw and turns into sores" Band Aid will tear off his skin   Codeine Other (See Comments)   Severe Headaches    Zolpidem Tartrate Other (See Comments)   Hallucinations   Hct [hydrochlorothiazide] Other (See Comments)   Hypercalcemia   Metformin And Related Diarrhea, Other (See Comments)   Cannot tolerate in higher doses- has to  drop from 2,000 mg daily to 1,500 mg daily to STOP the diarrhea that was occurring   Naproxen Diarrhea   Prednisone Diarrhea   Adhesive [tape] Rash, Other (See Comments)   Redness, also (thinks he can tolerate paper tape)   Penicillins Rash, Other (See Comments)   Welts Has patient had a PCN reaction causing immediate rash, facial/tongue/throat swelling, SOB or lightheadedness with hypotension: Yes Has patient had a PCN reaction  causing severe rash involving mucus membranes or skin necrosis: No Has patient had a PCN reaction that required hospitalization: No Has patient had a PCN reaction occurring within the last 10 years: No If all of the above answers are "NO", then may proceed with Cephalosporin use.        Medication List     STOP taking these medications    aspirin EC 325 MG tablet   clopidogrel 75 MG tablet Commonly known as: PLAVIX       TAKE these medications    Accu-Chek Guide w/Device Kit 1 Device by Does not apply route daily. Once daily E11.9   Accu-Chek Softclix Lancets lancets USE AS DIRECTED ONCE DAILY   albuterol 108 (90 Base) MCG/ACT inhaler Commonly known as: VENTOLIN HFA Inhale 1-2 puffs into the lungs every 6 (six) hours as needed for wheezing or shortness of breath.   atorvastatin 80 MG tablet Commonly known as: LIPITOR Take 1 tablet (80 mg total) by mouth daily.   budesonide-formoterol 160-4.5 MCG/ACT inhaler Commonly known as: SYMBICORT Inhale 2 puffs into the lungs 2 (two) times daily.   cefdinir 300 MG capsule Commonly known as: OMNICEF Take 1 capsule (300 mg total) by mouth 2 (two) times daily.   cetirizine 10 MG tablet Commonly known as: ZYRTEC Take 1 tablet (10 mg total) by mouth daily.   cloNIDine 0.1 MG tablet Commonly known as: CATAPRES Take 1 tablet (0.1 mg total) by mouth daily.   dapagliflozin propanediol 10 MG Tabs tablet Commonly known as: Farxiga Take 1 tablet (10 mg total) by mouth daily before breakfast.   Entresto 97-103 MG Generic drug: sacubitril-valsartan Take 1 tablet by mouth 2 (two) times daily.   famotidine 40 MG tablet Commonly known as: PEPCID Take 40 mg by mouth daily.   fenofibrate 160 MG tablet TAKE 1 TABLET BY MOUTH  DAILY   fluticasone 50 MCG/ACT nasal spray Commonly known as: FLONASE Place 1 spray into both nostrils as needed for allergies or rhinitis.   gabapentin 400 MG capsule Commonly known as: NEURONTIN TAKE  1 CAPSULE BY MOUTH 3  TIMES DAILY   glucose blood test strip 1 each by Other route 2 (two) times daily. Use to check blood sugars twice a day Dx E11.9   Accu-Chek Guide test strip Generic drug: glucose blood USE AS INSTRUCTED ONCE  DAILY   labetalol 300 MG tablet Commonly known as: NORMODYNE TAKE 1 TABLET BY MOUTH  TWICE DAILY   levothyroxine 100 MCG tablet Commonly known as: SYNTHROID Take 100 mcg by mouth daily before breakfast.   LORazepam 1 MG tablet Commonly known as: ATIVAN Take 0.5 tablets by mouth at bedtime.   metFORMIN 500 MG 24 hr tablet Commonly known as: GLUCOPHAGE-XR TAKE 4 TABLETS BY MOUTH  DAILY WITH BREAKFAST What changed: See the new instructions.   montelukast 10 MG tablet Commonly known as: SINGULAIR Take 10 mg by mouth daily.   Myrbetriq 25 MG Tb24 tablet Generic drug: mirabegron ER Take 25 mg by mouth daily.   nitroGLYCERIN 0.4 MG SL tablet Commonly  known as: NITROSTAT Place 0.4 mg under the tongue every 5 (five) minutes as needed for chest pain.   pantoprazole 40 MG tablet Commonly known as: PROTONIX Take 1 tablet (40 mg total) by mouth 2 (two) times daily before a meal.   spironolactone 25 MG tablet Commonly known as: ALDACTONE Take 0.5 tablets (12.5 mg total) by mouth daily.   Trelegy Ellipta 100-62.5-25 MCG/ACT Aepb Generic drug: Fluticasone-Umeclidin-Vilant Take 1 puff by mouth daily.        Follow-up Information     Danis, Andreas Blower, MD. Go to.   Specialty: Gastroenterology Why: as directed Contact information: [address] [place] [address] [phone]                 Discharge Instructions     Discharge instructions   Complete by: As directed    **IMPORTANT DISCHARGE INSTRUCTIONS**   From Dr. Maryfrances Bunnell: You were admitted for pain with swallowing, heartburn and vomiting as well as vomiting blood.  Here, you had a CT (three dimensional x-ray) of the abdomen that was unremarkable  The  [name], [name]) performed an endosopy that showed your hiatal hernia had worsened, and you had erosions of the esophagus causing pain and oozing blood.  For now: Take pantoprazole/Protonix 40 mg twice daily, 1/2 hour before eating (This is an acid suppressing medicine, to help the esophagus heal) You may continue famotidine/Pepcid if you'd like (it works differently than Protonix) Cut and chew food well Drink plenty of liquids with meals No food within 4 hours of bedtime (you can take Myrbetriq 4 hours before bed) Keep head of bed elevated 30 degrees while sleeping   Hold (DO NOT TAKE) clopidogrel/Plavix or aspirin until 5/23 at the earliest You may restart them at that time, but I would recommend only aspirin 81 mg daily and I would also recommend you discuss with both your primary doctor and your cardiologist (just call their office and leave a message with the nurse) whether you should restart clopidogrel/Plavix at all.   Follow up with GI as directed, they will discuss whether definitive treatment (I.e. surgical treatment) is reasonable   Lastly, you may have had a small aspiration pneumoni.  Take 3 more days of antibiotics with cefdinir then stop   Increase activity slowly   Complete by: As directed        Discharge Exam: Filed Weights   04/09/23 2202 04/11/23 0742  Weight: 79.4 kg 79.4 kg    General: Pt is alert, awake, not in acute distress Cardiovascular: RRR, nl S1-S2, no murmurs appreciated.   No LE edema.   Respiratory: Normal respiratory rate and rhythm.  CTAB without rales or wheezes. Abdominal: Abdomen soft and non-tender.  No distension or HSM.   Neuro/Psych: Strength symmetric in upper and lower extremities.  Judgment and insight appear normal.   Condition at discharge: good  The results of significant diagnostics from this hospitalization (including imaging, microbiology, ancillary and laboratory) are listed below for reference.    Imaging Studies: DG CHEST PORT 1 VIEW  Result Date: 04/10/2023 CLINICAL DATA:  Nausea, vomiting EXAM: PORTABLE CHEST 1 VIEW COMPARISON:  Previous studies including the examination done earlier today FINDINGS: Cardiac size is within normal limits. There are no signs of pulmonary edema. There is prominence of interstitial markings in right lung and left lower lung field with no significant interval change. There is no focal consolidation. There is no pleural effusion or pneumothorax. IMPRESSION: There is prominence  of interstitial markings in right lung and left lower lung fields suggesting scarring and possibly superimposed interstitial pneumonia. There is no focal pulmonary consolidation. There is no pleural effusion or pneumothorax. Electronically Signed   By: Ernie Avena M.D.   On: 04/10/2023 15:32   CT ABDOMEN PELVIS W CONTRAST  Result Date: 04/10/2023 CLINICAL DATA:  77 year old male with epigastric pain, nausea vomiting for 4 days. Right flank pain and no recent bowel movement. EXAM: CT ABDOMEN AND PELVIS WITH CONTRAST TECHNIQUE: Multidetector CT imaging of the abdomen and pelvis was performed using the standard protocol following bolus administration of intravenous contrast. RADIATION DOSE REDUCTION: This exam was performed according to the departmental dose-optimization program which includes automated exposure control, adjustment of the mA and/or kV according to patient size and/or use of iterative reconstruction technique. CONTRAST:  75mL OMNIPAQUE IOHEXOL 350 MG/ML SOLN COMPARISON:  [facility] chest CTA 08/21/2021 and CTA abdomen and runoff 10/15/2017. FINDINGS: Lower chest: Emphysema demonstrated on the 2022 CT with upper lobe predominance and heterogeneous peribronchial opacity at the lung bases today most resembles postinflammatory scarring, with occasional small calcified lung nodules. No pleural effusion. Heart size within normal limits. No pericardial effusion.  Hepatobiliary: Gallbladder mildly distended but does not appear inflamed and no sludge or stones are evident. No biliary ductal dilatation. Liver enhancement within normal limits. Pancreas: Partially atrophied but no active inflammation. Mild dystrophic calcifications, no main pancreatic ductal dilatation. Spleen: Negative.  Punctate calcified granuloma. Adrenals/Urinary Tract: Negative adrenal glands. Nonobstructed kidneys with symmetric renal enhancement and contrast excretion. Decompressed ureters. Unremarkable bladder. Stomach/Bowel: Redundant sigmoid colon with mild diverticulosis, but the sigmoid is fairly decompressed. There is mild retained stool in the rectum. Upstream moderate retained stool in redundant transverse colon and colonic flexures. Terminal ileum is decompressed. No large bowel inflammation is identified and decompressed normal appendix is suspected on coronal image 77. No dilated small bowel. No free air, free fluid, or mesenteric inflammation identified. Chronic mild to moderate hiatal hernia. And there is circumferential wall thickening of the visible distal esophagus (series 3, image 5). Small chronic surgical clips along the anterior gastric hernia. Intra-abdominal stomach and duodenum are decompressed. There is a small 2.3 cm diverticulum at the ligament of Treitz which is chronic (series 3, image 37) with no adjacent inflammation. Vascular/Lymphatic: Extensive Aortoiliac calcified atherosclerosis. Major arterial structures in the abdomen and pelvis remain patent. Normal caliber abdominal aorta. Portal venous system appears patent. No lymphadenopathy identified. Reproductive: Mild prostatomegaly.  Pelvic phleboliths. Other: No pelvis free fluid. Musculoskeletal: Multilevel spine degeneration. No acute or suspicious osseous lesion. IMPRESSION: 1. Distal esophageal wall thickening compatible with a degree of Esophagitis., Superimposed on moderate chronic gastric hiatal hernia. 2. But  decompressed stomach. No gastro duodenal inflammation is evident. Small chronic distal duodenal diverticulum. 3. No other acute or inflammatory process identified in the abdomen or pelvis. Redundant large bowel with retained stool. Aortic Atherosclerosis (ICD10-I70.0). Chronic postinflammatory scarring suspected at both lung bases. Electronically Signed   By: Odessa Fleming M.D.   On: 04/10/2023 05:36   DG Chest 2 View  Result Date: 04/10/2023 CLINICAL DATA:  Concern for pneumonia EXAM: CHEST - 2 VIEW COMPARISON:  12/22/2020 FINDINGS: Heart and mediastinal contours within normal limits. Interstitial prominence throughout the lungs, right greater than left, similar to prior study. This could reflect chronic lung disease. No visible effusions or acute bony abnormality. IMPRESSION: Interstitial prominence within the lungs, slightly greater on the right. Favor chronic lung disease. Electronically Signed   By: Charlett Nose  M.D.   On: 04/10/2023 03:39    Microbiology: Results for orders placed or performed during the hospital encounter of 04/09/23  Surgical pcr screen     Status: None   Collection Time: 04/10/23 10:30 PM   Specimen: Nasal Mucosa; Nasal Swab  Result Value Ref Range Status   MRSA, PCR NEGATIVE NEGATIVE Final   Staphylococcus aureus NEGATIVE NEGATIVE Final    Comment: (NOTE) The Xpert SA Assay (FDA approved for NASAL specimens in patients 45 years of age and older), is one component of a comprehensive surveillance program. It is not intended to diagnose infection nor to guide or monitor treatment. Performed at [facility], [address], [place], [place] [address]     Labs: CBC: Recent Labs  Lab 04/09/23 2229 04/10/23 1253 04/10/23 2132 04/11/23 0746 04/11/23 0958  WBC 10.2  --   --   --  9.6  HGB 13.7 12.0* 10.8* 11.6* 10.6*  HCT 42.0 37.0* 32.6* 34.0* 32.4*  MCV 92.7  --   --   --  92.0  PLT 341  --   --   --  308   Basic Metabolic Panel: Recent Labs  Lab  04/09/23 2229 04/10/23 1253 04/11/23 0746 04/11/23 0958 04/12/23 0814  NA 138 139 141 138 138  K 3.1* 2.9* 3.3* 2.9* 3.3*  CL 98 100 102 103 105  CO2 24 25  --  23 24  GLUCOSE 126* 111* 92 95 107*  BUN 17 14 13 12 10   CREATININE 1.13 0.95 0.70 0.89 0.88  CALCIUM 9.3 8.7*  --  8.2* 8.3*  MG  --   --   --  1.8  --    Liver Function Tests: Recent Labs  Lab 04/09/23 2229 04/12/23 0814  AST 27 28  ALT 23 23  ALKPHOS 48 43  BILITOT 1.4* 0.6  PROT 6.9 5.8*  ALBUMIN 3.2* 2.5*   CBG: Recent Labs  Lab 04/11/23 1404 04/11/23 2132 04/12/23 0747  GLUCAP 141* 131* 104*    Discharge time spent: approximately 35 minutes spent on discharge counseling, evaluation of patient on day of discharge, and coordination of discharge planning with nursing, social work, pharmacy and case management  Signed: Alberteen Sam, MD [facility] 04/12/2023

## 2023-04-13 ENCOUNTER — Encounter: Payer: Self-pay | Admitting: Cardiology

## 2023-04-14 ENCOUNTER — Encounter (HOSPITAL_COMMUNITY): Payer: Self-pay | Admitting: Gastroenterology

## 2023-04-14 ENCOUNTER — Encounter: Payer: Self-pay | Admitting: Gastroenterology

## 2023-04-14 ENCOUNTER — Encounter: Payer: Self-pay | Admitting: Cardiology

## 2023-04-15 ENCOUNTER — Inpatient Hospital Stay (HOSPITAL_COMMUNITY)
Admission: EM | Admit: 2023-04-15 | Discharge: 2023-04-20 | DRG: 603 | Disposition: A | Discharge status: Skilled Nursing Facility | Payer: PPO | Attending: Internal Medicine | Admitting: Internal Medicine

## 2023-04-15 ENCOUNTER — Emergency Department (HOSPITAL_COMMUNITY): Payer: PPO

## 2023-04-15 ENCOUNTER — Encounter (HOSPITAL_COMMUNITY): Payer: Self-pay

## 2023-04-15 DIAGNOSIS — R2689 Other abnormalities of gait and mobility: Secondary | ICD-10-CM | POA: Diagnosis not present

## 2023-04-15 DIAGNOSIS — E46 Unspecified protein-calorie malnutrition: Secondary | ICD-10-CM | POA: Diagnosis not present

## 2023-04-15 DIAGNOSIS — I891 Lymphangitis: Secondary | ICD-10-CM | POA: Diagnosis not present

## 2023-04-15 DIAGNOSIS — Z79890 Hormone replacement therapy (postmenopausal): Secondary | ICD-10-CM | POA: Diagnosis not present

## 2023-04-15 DIAGNOSIS — E538 Deficiency of other specified B group vitamins: Secondary | ICD-10-CM | POA: Diagnosis present

## 2023-04-15 DIAGNOSIS — E559 Vitamin D deficiency, unspecified: Secondary | ICD-10-CM | POA: Diagnosis present

## 2023-04-15 DIAGNOSIS — R600 Localized edema: Secondary | ICD-10-CM | POA: Diagnosis not present

## 2023-04-15 DIAGNOSIS — E785 Hyperlipidemia, unspecified: Secondary | ICD-10-CM | POA: Diagnosis present

## 2023-04-15 DIAGNOSIS — M6281 Muscle weakness (generalized): Secondary | ICD-10-CM | POA: Diagnosis not present

## 2023-04-15 DIAGNOSIS — M4312 Spondylolisthesis, cervical region: Secondary | ICD-10-CM | POA: Diagnosis not present

## 2023-04-15 DIAGNOSIS — H9202 Otalgia, left ear: Secondary | ICD-10-CM | POA: Diagnosis present

## 2023-04-15 DIAGNOSIS — Z8616 Personal history of COVID-19: Secondary | ICD-10-CM | POA: Diagnosis not present

## 2023-04-15 DIAGNOSIS — L03114 Cellulitis of left upper limb: Principal | ICD-10-CM | POA: Diagnosis present

## 2023-04-15 DIAGNOSIS — J392 Other diseases of pharynx: Secondary | ICD-10-CM | POA: Diagnosis not present

## 2023-04-15 DIAGNOSIS — R079 Chest pain, unspecified: Secondary | ICD-10-CM | POA: Diagnosis not present

## 2023-04-15 DIAGNOSIS — Z888 Allergy status to other drugs, medicaments and biological substances status: Secondary | ICD-10-CM

## 2023-04-15 DIAGNOSIS — Z880 Allergy status to penicillin: Secondary | ICD-10-CM

## 2023-04-15 DIAGNOSIS — Z87891 Personal history of nicotine dependence: Secondary | ICD-10-CM

## 2023-04-15 DIAGNOSIS — Z96652 Presence of left artificial knee joint: Secondary | ICD-10-CM | POA: Diagnosis present

## 2023-04-15 DIAGNOSIS — I2510 Atherosclerotic heart disease of native coronary artery without angina pectoris: Secondary | ICD-10-CM | POA: Diagnosis present

## 2023-04-15 DIAGNOSIS — I447 Left bundle-branch block, unspecified: Secondary | ICD-10-CM | POA: Diagnosis present

## 2023-04-15 DIAGNOSIS — K2100 Gastro-esophageal reflux disease with esophagitis, without bleeding: Secondary | ICD-10-CM | POA: Diagnosis present

## 2023-04-15 DIAGNOSIS — R569 Unspecified convulsions: Secondary | ICD-10-CM | POA: Diagnosis present

## 2023-04-15 DIAGNOSIS — M1812 Unilateral primary osteoarthritis of first carpometacarpal joint, left hand: Secondary | ICD-10-CM | POA: Diagnosis not present

## 2023-04-15 DIAGNOSIS — R6884 Jaw pain: Secondary | ICD-10-CM | POA: Diagnosis present

## 2023-04-15 DIAGNOSIS — J449 Chronic obstructive pulmonary disease, unspecified: Secondary | ICD-10-CM | POA: Diagnosis present

## 2023-04-15 DIAGNOSIS — Z7984 Long term (current) use of oral hypoglycemic drugs: Secondary | ICD-10-CM

## 2023-04-15 DIAGNOSIS — E1140 Type 2 diabetes mellitus with diabetic neuropathy, unspecified: Secondary | ICD-10-CM | POA: Diagnosis not present

## 2023-04-15 DIAGNOSIS — M65842 Other synovitis and tenosynovitis, left hand: Secondary | ICD-10-CM | POA: Diagnosis present

## 2023-04-15 DIAGNOSIS — I5022 Chronic systolic (congestive) heart failure: Secondary | ICD-10-CM | POA: Diagnosis present

## 2023-04-15 DIAGNOSIS — Z7951 Long term (current) use of inhaled steroids: Secondary | ICD-10-CM

## 2023-04-15 DIAGNOSIS — M542 Cervicalgia: Secondary | ICD-10-CM

## 2023-04-15 DIAGNOSIS — Z8673 Personal history of transient ischemic attack (TIA), and cerebral infarction without residual deficits: Secondary | ICD-10-CM

## 2023-04-15 DIAGNOSIS — I110 Hypertensive heart disease with heart failure: Secondary | ICD-10-CM | POA: Diagnosis present

## 2023-04-15 DIAGNOSIS — R2681 Unsteadiness on feet: Secondary | ICD-10-CM | POA: Diagnosis not present

## 2023-04-15 DIAGNOSIS — Z1152 Encounter for screening for COVID-19: Secondary | ICD-10-CM

## 2023-04-15 DIAGNOSIS — Z825 Family history of asthma and other chronic lower respiratory diseases: Secondary | ICD-10-CM

## 2023-04-15 DIAGNOSIS — I429 Cardiomyopathy, unspecified: Secondary | ICD-10-CM | POA: Diagnosis present

## 2023-04-15 DIAGNOSIS — Z8249 Family history of ischemic heart disease and other diseases of the circulatory system: Secondary | ICD-10-CM

## 2023-04-15 DIAGNOSIS — E039 Hypothyroidism, unspecified: Secondary | ICD-10-CM | POA: Diagnosis present

## 2023-04-15 DIAGNOSIS — R1314 Dysphagia, pharyngoesophageal phase: Secondary | ICD-10-CM | POA: Diagnosis not present

## 2023-04-15 DIAGNOSIS — K2971 Gastritis, unspecified, with bleeding: Secondary | ICD-10-CM | POA: Diagnosis not present

## 2023-04-15 DIAGNOSIS — N400 Enlarged prostate without lower urinary tract symptoms: Secondary | ICD-10-CM | POA: Diagnosis present

## 2023-04-15 DIAGNOSIS — K219 Gastro-esophageal reflux disease without esophagitis: Secondary | ICD-10-CM | POA: Diagnosis not present

## 2023-04-15 DIAGNOSIS — R54 Age-related physical debility: Secondary | ICD-10-CM | POA: Diagnosis present

## 2023-04-15 DIAGNOSIS — M25532 Pain in left wrist: Secondary | ICD-10-CM | POA: Diagnosis not present

## 2023-04-15 DIAGNOSIS — Z91048 Other nonmedicinal substance allergy status: Secondary | ICD-10-CM

## 2023-04-15 DIAGNOSIS — I2489 Other forms of acute ischemic heart disease: Secondary | ICD-10-CM | POA: Diagnosis present

## 2023-04-15 DIAGNOSIS — Z6822 Body mass index (BMI) 22.0-22.9, adult: Secondary | ICD-10-CM

## 2023-04-15 DIAGNOSIS — Z885 Allergy status to narcotic agent status: Secondary | ICD-10-CM

## 2023-04-15 DIAGNOSIS — Z91040 Latex allergy status: Secondary | ICD-10-CM

## 2023-04-15 DIAGNOSIS — I10 Essential (primary) hypertension: Secondary | ICD-10-CM | POA: Diagnosis not present

## 2023-04-15 DIAGNOSIS — Z79899 Other long term (current) drug therapy: Secondary | ICD-10-CM

## 2023-04-15 DIAGNOSIS — Z86010 Personal history of colonic polyps: Secondary | ICD-10-CM

## 2023-04-15 DIAGNOSIS — M4722 Other spondylosis with radiculopathy, cervical region: Secondary | ICD-10-CM | POA: Diagnosis present

## 2023-04-15 DIAGNOSIS — E119 Type 2 diabetes mellitus without complications: Secondary | ICD-10-CM | POA: Diagnosis present

## 2023-04-15 DIAGNOSIS — M19011 Primary osteoarthritis, right shoulder: Secondary | ICD-10-CM | POA: Diagnosis present

## 2023-04-15 DIAGNOSIS — E7800 Pure hypercholesterolemia, unspecified: Secondary | ICD-10-CM

## 2023-04-15 LAB — CBC WITH DIFFERENTIAL/PLATELET
Abs Immature Granulocytes: 0.11 10*3/uL — ABNORMAL HIGH (ref 0.00–0.07)
Basophils Absolute: 0 10*3/uL (ref 0.0–0.1)
Basophils Relative: 0 %
Eosinophils Absolute: 0 10*3/uL (ref 0.0–0.5)
Eosinophils Relative: 0 %
HCT: 34.4 % — ABNORMAL LOW (ref 39.0–52.0)
Hemoglobin: 10.9 g/dL — ABNORMAL LOW (ref 13.0–17.0)
Immature Granulocytes: 1 %
Lymphocytes Relative: 9 %
Lymphs Abs: 1.4 10*3/uL (ref 0.7–4.0)
MCH: 29.5 pg (ref 26.0–34.0)
MCHC: 31.7 g/dL (ref 30.0–36.0)
MCV: 93.2 fL (ref 80.0–100.0)
Monocytes Absolute: 2.5 10*3/uL — ABNORMAL HIGH (ref 0.1–1.0)
Monocytes Relative: 16 %
Neutro Abs: 11.4 10*3/uL — ABNORMAL HIGH (ref 1.7–7.7)
Neutrophils Relative %: 74 %
Platelets: 419 10*3/uL — ABNORMAL HIGH (ref 150–400)
RBC: 3.69 MIL/uL — ABNORMAL LOW (ref 4.22–5.81)
RDW: 14.1 % (ref 11.5–15.5)
WBC: 15.5 10*3/uL — ABNORMAL HIGH (ref 4.0–10.5)
nRBC: 0 % (ref 0.0–0.2)

## 2023-04-15 LAB — COMPREHENSIVE METABOLIC PANEL
ALT: 16 U/L (ref 0–44)
AST: 18 U/L (ref 15–41)
Albumin: 2.4 g/dL — ABNORMAL LOW (ref 3.5–5.0)
Alkaline Phosphatase: 53 U/L (ref 38–126)
Anion gap: 10 (ref 5–15)
BUN: 10 mg/dL (ref 8–23)
CO2: 24 mmol/L (ref 22–32)
Calcium: 8.6 mg/dL — ABNORMAL LOW (ref 8.9–10.3)
Chloride: 105 mmol/L (ref 98–111)
Creatinine, Ser: 1.01 mg/dL (ref 0.61–1.24)
GFR, Estimated: >60 mL/min (ref >60)
Glucose, Bld: 134 mg/dL — ABNORMAL HIGH (ref 70–99)
Potassium: 3.9 mmol/L (ref 3.5–5.1)
Sodium: 139 mmol/L (ref 135–145)
Total Bilirubin: 0.8 mg/dL (ref 0.3–1.2)
Total Protein: 6.3 g/dL — ABNORMAL LOW (ref 6.5–8.1)

## 2023-04-15 LAB — TROPONIN I (HIGH SENSITIVITY)
Troponin I (High Sensitivity): 28 ng/L — ABNORMAL HIGH (ref <18)
Troponin I (High Sensitivity): 23 ng/L — ABNORMAL HIGH (ref <18)

## 2023-04-15 MED ORDER — SODIUM CHLORIDE 0.9 % IV SOLN
2.0000 g | Freq: Once | INTRAVENOUS | Status: AC
  Administered 2023-04-16: 2 g via INTRAVENOUS
  Filled 2023-04-15: qty 12.5

## 2023-04-15 MED ORDER — SODIUM CHLORIDE 0.9 % IV BOLUS
1000.0000 mL | Freq: Once | INTRAVENOUS | Status: AC
  Administered 2023-04-16: 1000 mL via INTRAVENOUS

## 2023-04-15 NOTE — Sepsis Progress Note (Signed)
[facility] following for sepsis protocol. 

## 2023-04-15 NOTE — ED Provider Notes (Signed)
[facility]PT [facility] Emergency Department Provider Note MRN:  [id number]  Arrival date & time: 04/16/23     Chief Complaint   Hand pain History of Present Illness   [name] is a 77 y.o. year-old male with a history of cardiomyopathy, CAD, diabetes presenting to the ED with chief complaint of hand pain.  Patient had a recent admission for hematemesis, was found to have esophagitis.  Has been home for a few days.  Now complaining of pain to the left hand, left wrist, radiation into the left jaw.  Possible fever at home.  Review of Systems  A thorough review of systems was obtained and all systems are negative except as noted in the HPI and PMH.   Patient's Health History    Past Medical History:  Diagnosis Date   Abnormality of gait 03/31/2013   ALLERGIC RHINITIS 11/06/2007   Anemia, unspecified 08/22/2012   BENIGN PROSTATIC HYPERTROPHY 07/14/2007   CAD (coronary artery disease) 08/01/2022   Cardiomyopathy (HCC) 06/11/2022   Cervical spondylosis with radiculopathy 06/11/2022   Chronic osteomyelitis of toe of left foot (HCC) 09/12/2017   Chronic right shoulder pain 06/29/2022   COLONIC POLYPS, HX OF 07/14/2007   Complication of anesthesia    pt states he needs to be cathed after surgery   COVID-19    Degenerative arthritis of right knee 12/12/2011   Diabetes (HCC) 02/03/2013   Diet controlled   Diabetes mellitus due to underlying condition with unspecified complications (HCC) 06/11/2022   Disequilibrium 03/31/2013   Dyspnea 08/31/2019   Encounter for well adult exam with abnormal findings 12/10/2011   Essential hypertension 07/11/2007   Qualifier: Diagnosis of  By: Maris Berger    Former smoker 02/08/2015   GERD 07/11/2007   Hypercalcemia 08/12/2015   Hyperlipidemia    Hyperthyroidism    Hypervitaminosis D 01/14/2019   Hypothyroidism    Hypoxia    Lacunar infarction (HCC) 03/31/2013   Microalbuminuria 08/31/2019   Neck pain  03/26/2022   Neural foraminal stenosis of cervical spine 06/11/2022   OSTEOARTHRITIS, KNEE, RIGHT 10/27/2008   PERIPHERAL EDEMA 05/31/2009   Polyarthralgia 09/12/2017   Primary osteoarthritis of right shoulder 06/29/2022   PULMONARY NODULE 06/24/2008   granuloma   Recurrent falls 08/17/2016   Right shoulder pain 08/17/2016   Rotator cuff arthropathy 09/07/2016   Injected 09/07/2016 Repeat injection 01/07/17 Repeat injection April 20th 2018. Repeat injection 05/22/2017 Repeat injection 08/15/2017   Skin lesion 02/19/2017   Spinal stenosis in cervical region 03/31/2013   Spondylosis, cervical, with myelopathy 11/11/2012   TIA (transient ischemic attack) 08/13/2015   Upper back pain on right side 08/31/2019   Vitamin D deficiency 08/12/2015   Weight loss 08/31/2019    Past Surgical History:  Procedure Laterality Date   ANTERIOR CERVICAL DECOMP/DISCECTOMY FUSION N/A 01/06/2013   Procedure: ANTERIOR CERVICAL DECOMPRESSION/DISCECTOMY FUSION 1 LEVEL;  Surgeon: Maeola Harman, MD;  Location: [facility];  Service: Neurosurgery;  Laterality: N/A;  Cervical three-four Anterior cervical decompression/diskectomy/fusion   BACK SURGERY  1986   x 2  1986 C 3   ESOPHAGOGASTRODUODENOSCOPY (EGD) WITH PROPOFOL N/A 04/11/2023   Procedure: ESOPHAGOGASTRODUODENOSCOPY (EGD) WITH PROPOFOL;  Surgeon: Sherrilyn Rist, MD;  Location: [facility] ENDOSCOPY;  Service: Gastroenterology;  Laterality: N/A;   HERNIA REPAIR  sept 1986   left knee replacement  Nov 21, 2007   NECK SURGERY     NISSEN FUNDOPLICATION  2000   right heel bone spur  1987   TOTAL KNEE ARTHROPLASTY  01/09/2012   Procedure: TOTAL KNEE ARTHROPLASTY;  Surgeon: Jacki Cones, MD;  Location: [facility] ORS;  Service: Orthopedics;  Laterality: Right;    Family History  Problem Relation Age of Onset   Heart attack Mother    Heart attack Father    Emphysema Father    Heart disease Other    Diabetes Neg Hx    Thyroid disease Neg Hx    Hypercalcemia Neg Hx      Social History   Socioeconomic History   Marital status: Married    Spouse name: Eber Jones   Number of children: 2   Years of education: HS   Highest education level: Not on file  Occupational History   Occupation: Retired  Tobacco Use   Smoking status: Former    Packs/day: 3.00    Years: 30.00    Additional pack years: 0.00    Total pack years: 90.00    Types: Cigarettes    Quit date: 11/26/1982    Years since quitting: 40.4   Smokeless tobacco: Never  Substance and Sexual Activity   Alcohol use: No   Drug use: No   Sexual activity: Not on file  Other Topics Concern   Not on file  Social History Narrative   Patient lives at home with spouse.   Caffiene Use: 4 cups daily   Social Determinants of Health   Financial Resource Strain: Not on file  Food Insecurity: No Food Insecurity (04/10/2023)   Hunger Vital Sign    Worried About Running Out of Food in the Last Year: Never true    Ran Out of Food in the Last Year: Never true  Transportation Needs: No Transportation Needs (04/10/2023)   PRAPARE - Administrator, Civil Service (Medical): No    Lack of Transportation (Non-Medical): No  Physical Activity: Not on file  Stress: Not on file  Social Connections: Not on file  Intimate Partner Violence: Not At Risk (04/10/2023)   Humiliation, Afraid, Rape, and Kick questionnaire    Fear of Current or Ex-Partner: No    Emotionally Abused: No    Physically Abused: No    Sexually Abused: No     Physical Exam   Vitals:   04/15/23 2339 04/16/23 0230  BP:  (!) 145/81  Pulse:  73  Resp:  16  Temp: 98.9 F (37.2 C)   SpO2:  97%    CONSTITUTIONAL: Chronically ill-appearing, NAD NEURO/PSYCH:  Alert and oriented x 3, no focal deficits EYES:  eyes equal and reactive ENT/NECK:  no LAD, no JVD CARDIO: Regular rate, well-perfused, normal S1 and S2 PULM:  CTAB no wheezing or rhonchi GI/GU:  non-distended, non-tender MSK/SPINE:  No gross deformities, no edema,  tenderness to the left jaw, left lateral neck SKIN: Erythema and swelling and increased warmth to the left hand with red streaking up to the left antecubital fossa.   *Additional and/or pertinent findings included in MDM below  Diagnostic and Interventional Summary    EKG Interpretation  Date/Time:  Monday Apr 15 2023 15:59:41 EDT Ventricular Rate:  69 PR Interval:  160 QRS Duration: 164 QT Interval:  492 QTC Calculation: 527 R Axis:   -23 Text Interpretation: Normal sinus rhythm Left bundle branch block Abnormal ECG When compared with ECG of 09-Apr-2023 22:00, PREVIOUS ECG IS PRESENT Confirmed by Kennis Carina [phone]) on 04/16/2023 3:03:22 AM       Labs Reviewed  CBC WITH DIFFERENTIAL/PLATELET - Abnormal; Notable for the following components:  Result Value   WBC 15.5 (*)    RBC 3.69 (*)    Hemoglobin 10.9 (*)    HCT 34.4 (*)    Platelets 419 (*)    Neutro Abs 11.4 (*)    Monocytes Absolute 2.5 (*)    Abs Immature Granulocytes 0.11 (*)    All other components within normal limits  COMPREHENSIVE METABOLIC PANEL - Abnormal; Notable for the following components:   Glucose, Bld 134 (*)    Calcium 8.6 (*)    Total Protein 6.3 (*)    Albumin 2.4 (*)    All other components within normal limits  PROTIME-INR - Abnormal; Notable for the following components:   Prothrombin Time 16.0 (*)    INR 1.3 (*)    All other components within normal limits  APTT - Abnormal; Notable for the following components:   aPTT 53 (*)    All other components within normal limits  TROPONIN I (HIGH SENSITIVITY) - Abnormal; Notable for the following components:   Troponin I (High Sensitivity) 28 (*)    All other components within normal limits  TROPONIN I (HIGH SENSITIVITY) - Abnormal; Notable for the following components:   Troponin I (High Sensitivity) 23 (*)    All other components within normal limits  RESP PANEL BY RT-PCR (RSV, FLU A&B, COVID)  RVPGX2  CULTURE, BLOOD (ROUTINE X 2)   CULTURE, BLOOD (ROUTINE X 2)  LACTIC ACID, PLASMA  LACTIC ACID, PLASMA    CT Soft Tissue Neck W Contrast  Final Result    DG Hand Complete Left  Final Result    DG Chest 2 View  Final Result    CT C-SPINE NO CHARGE    (Results Pending)    Medications  vancomycin (VANCOREADY) IVPB 1500 mg/300 mL (1,500 mg Intravenous New Bag/Given 04/16/23 0209)  vancomycin (VANCOREADY) IVPB 1500 mg/300 mL (has no administration in time range)  sodium chloride 0.9 % bolus 1,000 mL (1,000 mLs Intravenous New Bag/Given 04/16/23 0117)  ceFEPIme (MAXIPIME) 2 g in sodium chloride 0.9 % 100 mL IVPB (0 g Intravenous Stopped 04/16/23 0153)  iohexol (OMNIPAQUE) 350 MG/ML injection 75 mL (75 mLs Intravenous Contrast Given 04/16/23 0137)     Procedures  /  Critical Care .Critical Care  Performed by: Sabas Sous, MD Authorized by: Sabas Sous, MD   Critical care provider statement:    Critical care time (minutes):  35   Critical care was necessary to treat or prevent imminent or life-threatening deterioration of the following conditions: Lymphangitis, concern for early sepsis.   Critical care was time spent personally by me on the following activities:  Development of treatment plan with patient or surrogate, discussions with consultants, evaluation of patient's response to treatment, examination of patient, ordering and review of laboratory studies, ordering and review of radiographic studies, ordering and performing treatments and interventions, pulse oximetry, re-evaluation of patient's condition and review of old charts   ED Course and Medical Decision Making  Initial Impression and Ddx Exam is consistent with lymphangitis spreading from possibly a cellulitis to the left hand.  For this will need admission for IV antibiotics.  Code sepsis initiated given the leukocytosis, temp 99.5.  Other considerations include atypical ACS presentation given the neck and jaw pain, Lemierre's disease.  Past  medical/surgical history that increases complexity of ED encounter: Cardiomyopathy, CAD  Interpretation of Diagnostics I personally reviewed the EKG and my interpretation is as follows: Sinus rhythm, left bundle branch block  Labs reveal leukocytosis, minimally  elevated troponin  Patient Reassessment and Ultimate Disposition/Management     CT imaging overall reassuring.  Will admit to medicine.  Patient management required discussion with the following services or consulting groups:  None  Complexity of Problems Addressed Acute illness or injury that poses threat of life of bodily function  Additional Data Reviewed and Analyzed Further history obtained from: Prior labs/imaging results  Additional Factors Impacting ED Encounter Risk Consideration of hospitalization  Elmer Sow. Pilar Plate, MD [facility] Emergency Medicine [facility] [email or web address]  Final Clinical Impressions(s) / ED Diagnoses     ICD-10-CM   1. Lymphangitis  I89.1       ED Discharge Orders     None        Discharge Instructions Discussed with and Provided to Patient:   Discharge Instructions   None      Sabas Sous, MD 04/16/23 [phone]

## 2023-04-15 NOTE — ED Triage Notes (Addendum)
Pt bib ems form home; pt had esophagus stretched 5/16; c/o pain L jaw and L wrist; family states weaker than normal, not been eating well, pt states he can't chew; family called surgeon, sent to ED for further eval; no n/v/d; only c/o jaw , neck, and wrist pain; 130/72, P 68, RR 17, 96% RA, cbg 138

## 2023-04-15 NOTE — ED Provider Triage Note (Signed)
Emergency Medicine Provider Triage Evaluation Note  [name] , a 77 y.o. male  was evaluated in triage.  Patient has multiple complaints.  He had an EGD after having hematemesis around a week ago at [facility].  He was sent home however he now complains of left hand pain where there was previously an IV, right-sided chest pain that is only present when he coughs.  Cough is improving over the past couple days but he still occasionally has a cough.  Also complaining of left jaw pain that radiates around to the right side.  No shortness of breath.   Family is also concerned that he is not eating and drinking as much.  Brought here from [place] due to recent EGD procedure here  Review of Systems  Positive:  Negative:   Physical Exam  There were no vitals taken for this visit. Gen:   Awake, no distress   Resp:  Normal effort  MSK:   Moves extremities without difficulty  Other:  Some degree of trismus but clear and tolerating secretions.  Reproducible TTP to the left hand which is swollen.  Medical Decision Making  Medically screening exam initiated at 4:12 PM.  Appropriate orders placed.  [name] was informed that the remainder of the evaluation will be completed by another provider, this initial triage assessment does not replace that evaluation, and the importance of remaining in the ED until their evaluation is complete.  Will do ACS workup   Kashmir Leedy A, PA-C 04/15/23 1620

## 2023-04-16 ENCOUNTER — Inpatient Hospital Stay (HOSPITAL_COMMUNITY): Payer: PPO

## 2023-04-16 ENCOUNTER — Other Ambulatory Visit: Payer: Self-pay

## 2023-04-16 ENCOUNTER — Emergency Department (HOSPITAL_COMMUNITY): Payer: PPO

## 2023-04-16 DIAGNOSIS — Z79890 Hormone replacement therapy (postmenopausal): Secondary | ICD-10-CM | POA: Diagnosis not present

## 2023-04-16 DIAGNOSIS — I447 Left bundle-branch block, unspecified: Secondary | ICD-10-CM | POA: Diagnosis present

## 2023-04-16 DIAGNOSIS — L03114 Cellulitis of left upper limb: Secondary | ICD-10-CM | POA: Diagnosis present

## 2023-04-16 DIAGNOSIS — Z8616 Personal history of COVID-19: Secondary | ICD-10-CM | POA: Diagnosis not present

## 2023-04-16 DIAGNOSIS — I2510 Atherosclerotic heart disease of native coronary artery without angina pectoris: Secondary | ICD-10-CM | POA: Diagnosis present

## 2023-04-16 DIAGNOSIS — H9202 Otalgia, left ear: Secondary | ICD-10-CM | POA: Diagnosis present

## 2023-04-16 DIAGNOSIS — Z1152 Encounter for screening for COVID-19: Secondary | ICD-10-CM | POA: Diagnosis not present

## 2023-04-16 DIAGNOSIS — K2100 Gastro-esophageal reflux disease with esophagitis, without bleeding: Secondary | ICD-10-CM | POA: Diagnosis present

## 2023-04-16 DIAGNOSIS — I110 Hypertensive heart disease with heart failure: Secondary | ICD-10-CM | POA: Diagnosis present

## 2023-04-16 DIAGNOSIS — E785 Hyperlipidemia, unspecified: Secondary | ICD-10-CM | POA: Diagnosis present

## 2023-04-16 DIAGNOSIS — Z87891 Personal history of nicotine dependence: Secondary | ICD-10-CM | POA: Diagnosis not present

## 2023-04-16 DIAGNOSIS — E538 Deficiency of other specified B group vitamins: Secondary | ICD-10-CM | POA: Diagnosis present

## 2023-04-16 DIAGNOSIS — E039 Hypothyroidism, unspecified: Secondary | ICD-10-CM | POA: Diagnosis present

## 2023-04-16 DIAGNOSIS — J449 Chronic obstructive pulmonary disease, unspecified: Secondary | ICD-10-CM | POA: Diagnosis present

## 2023-04-16 DIAGNOSIS — N400 Enlarged prostate without lower urinary tract symptoms: Secondary | ICD-10-CM | POA: Diagnosis present

## 2023-04-16 DIAGNOSIS — R569 Unspecified convulsions: Secondary | ICD-10-CM | POA: Diagnosis present

## 2023-04-16 DIAGNOSIS — Z96652 Presence of left artificial knee joint: Secondary | ICD-10-CM | POA: Diagnosis present

## 2023-04-16 DIAGNOSIS — E559 Vitamin D deficiency, unspecified: Secondary | ICD-10-CM | POA: Diagnosis present

## 2023-04-16 DIAGNOSIS — I2489 Other forms of acute ischemic heart disease: Secondary | ICD-10-CM | POA: Diagnosis present

## 2023-04-16 DIAGNOSIS — I429 Cardiomyopathy, unspecified: Secondary | ICD-10-CM | POA: Diagnosis present

## 2023-04-16 DIAGNOSIS — E119 Type 2 diabetes mellitus without complications: Secondary | ICD-10-CM | POA: Diagnosis present

## 2023-04-16 DIAGNOSIS — M65842 Other synovitis and tenosynovitis, left hand: Secondary | ICD-10-CM | POA: Diagnosis present

## 2023-04-16 DIAGNOSIS — I5022 Chronic systolic (congestive) heart failure: Secondary | ICD-10-CM | POA: Diagnosis present

## 2023-04-16 DIAGNOSIS — R6884 Jaw pain: Secondary | ICD-10-CM | POA: Diagnosis present

## 2023-04-16 HISTORY — DX: Cellulitis of left upper limb: L03.114

## 2023-04-16 LAB — CBC WITH DIFFERENTIAL/PLATELET
Abs Immature Granulocytes: 0.06 10*3/uL (ref 0.00–0.07)
Basophils Absolute: 0 10*3/uL (ref 0.0–0.1)
Basophils Relative: 0 %
Eosinophils Absolute: 0.1 10*3/uL (ref 0.0–0.5)
Eosinophils Relative: 0 %
HCT: 30.6 % — ABNORMAL LOW (ref 39.0–52.0)
Hemoglobin: 10 g/dL — ABNORMAL LOW (ref 13.0–17.0)
Immature Granulocytes: 0 %
Lymphocytes Relative: 8 %
Lymphs Abs: 1.2 10*3/uL (ref 0.7–4.0)
MCH: 30.1 pg (ref 26.0–34.0)
MCHC: 32.7 g/dL (ref 30.0–36.0)
MCV: 92.2 fL (ref 80.0–100.0)
Monocytes Absolute: 2.1 10*3/uL — ABNORMAL HIGH (ref 0.1–1.0)
Monocytes Relative: 15 %
Neutro Abs: 11 10*3/uL — ABNORMAL HIGH (ref 1.7–7.7)
Neutrophils Relative %: 77 %
Platelets: 437 10*3/uL — ABNORMAL HIGH (ref 150–400)
RBC: 3.32 MIL/uL — ABNORMAL LOW (ref 4.22–5.81)
RDW: 13.9 % (ref 11.5–15.5)
WBC: 14.3 10*3/uL — ABNORMAL HIGH (ref 4.0–10.5)
nRBC: 0 % (ref 0.0–0.2)

## 2023-04-16 LAB — BASIC METABOLIC PANEL
Anion gap: 12 (ref 5–15)
BUN: 13 mg/dL (ref 8–23)
CO2: 19 mmol/L — ABNORMAL LOW (ref 22–32)
Calcium: 8.3 mg/dL — ABNORMAL LOW (ref 8.9–10.3)
Chloride: 107 mmol/L (ref 98–111)
Creatinine, Ser: 1.04 mg/dL (ref 0.61–1.24)
GFR, Estimated: >60 mL/min (ref >60)
Glucose, Bld: 99 mg/dL (ref 70–99)
Potassium: 3.7 mmol/L (ref 3.5–5.1)
Sodium: 138 mmol/L (ref 135–145)

## 2023-04-16 LAB — GLUCOSE, CAPILLARY
Glucose-Capillary: 98 mg/dL (ref 70–99)
Glucose-Capillary: 112 mg/dL — ABNORMAL HIGH (ref 70–99)
Glucose-Capillary: 86 mg/dL (ref 70–99)
Glucose-Capillary: 124 mg/dL — ABNORMAL HIGH (ref 70–99)

## 2023-04-16 LAB — RESP PANEL BY RT-PCR (RSV, FLU A&B, COVID)  RVPGX2
Influenza A by PCR: NEGATIVE
Influenza B by PCR: NEGATIVE
Resp Syncytial Virus by PCR: NEGATIVE
SARS Coronavirus 2 by RT PCR: NEGATIVE

## 2023-04-16 LAB — PROTIME-INR
INR: 1.3 — ABNORMAL HIGH (ref 0.8–1.2)
Prothrombin Time: 16 seconds — ABNORMAL HIGH (ref 11.4–15.2)

## 2023-04-16 LAB — LACTIC ACID, PLASMA
Lactic Acid, Venous: 1.1 mmol/L (ref 0.5–1.9)
Lactic Acid, Venous: 1.2 mmol/L (ref 0.5–1.9)

## 2023-04-16 LAB — HEMOGLOBIN A1C
Hgb A1c MFr Bld: 6.1 % — ABNORMAL HIGH (ref 4.8–5.6)
Mean Plasma Glucose: 128.37 mg/dL

## 2023-04-16 LAB — APTT: aPTT: 53 seconds — ABNORMAL HIGH (ref 24–36)

## 2023-04-16 MED ORDER — MOMETASONE FURO-FORMOTEROL FUM 200-5 MCG/ACT IN AERO
2.0000 | INHALATION_SPRAY | Freq: Two times a day (BID) | RESPIRATORY_TRACT | Status: DC
  Filled 2023-04-16: qty 8.8

## 2023-04-16 MED ORDER — ALBUTEROL SULFATE (2.5 MG/3ML) 0.083% IN NEBU: 3.0000 mL | INHALATION_SOLUTION | Freq: Four times a day (QID) | RESPIRATORY_TRACT | Status: DC | PRN

## 2023-04-16 MED ORDER — ENOXAPARIN SODIUM 40 MG/0.4ML IJ SOSY
40.0000 mg | PREFILLED_SYRINGE | INTRAMUSCULAR | Status: DC
  Administered 2023-04-18 – 2023-04-20 (×3): 40 mg via SUBCUTANEOUS
  Filled 2023-04-16 (×5): qty 0.4

## 2023-04-16 MED ORDER — SODIUM CHLORIDE 0.9 % IV SOLN
2.0000 g | Freq: Three times a day (TID) | INTRAVENOUS | Status: DC
  Administered 2023-04-16 – 2023-04-19 (×10): 2 g via INTRAVENOUS
  Filled 2023-04-16 (×10): qty 12.5

## 2023-04-16 MED ORDER — INSULIN ASPART 100 UNIT/ML IJ SOLN: 0.0000 [IU] | Freq: Every day | INTRAMUSCULAR | Status: DC

## 2023-04-16 MED ORDER — INSULIN ASPART 100 UNIT/ML IJ SOLN
0.0000 [IU] | Freq: Three times a day (TID) | INTRAMUSCULAR | Status: DC
  Administered 2023-04-17 – 2023-04-19 (×3): 1 [IU] via SUBCUTANEOUS

## 2023-04-16 MED ORDER — VANCOMYCIN HCL 1500 MG/300ML IV SOLN
1500.0000 mg | INTRAVENOUS | Status: DC
  Administered 2023-04-16 – 2023-04-18 (×3): 1500 mg via INTRAVENOUS
  Filled 2023-04-16 (×3): qty 300

## 2023-04-16 MED ORDER — FENOFIBRATE 160 MG PO TABS
160.0000 mg | ORAL_TABLET | Freq: Every day | ORAL | Status: DC
  Administered 2023-04-16 – 2023-04-20 (×5): 160 mg via ORAL
  Filled 2023-04-16 (×6): qty 1

## 2023-04-16 MED ORDER — GADOBUTROL 1 MMOL/ML IV SOLN
7.0000 mL | Freq: Once | INTRAVENOUS | Status: AC | PRN
  Administered 2023-04-16: 7 mL via INTRAVENOUS

## 2023-04-16 MED ORDER — SACUBITRIL-VALSARTAN 97-103 MG PO TABS
1.0000 | ORAL_TABLET | Freq: Two times a day (BID) | ORAL | Status: DC
  Administered 2023-04-16 – 2023-04-20 (×9): 1 via ORAL
  Filled 2023-04-16 (×11): qty 1

## 2023-04-16 MED ORDER — ACETAMINOPHEN 325 MG PO TABS
650.0000 mg | ORAL_TABLET | Freq: Four times a day (QID) | ORAL | Status: DC | PRN
  Administered 2023-04-16 – 2023-04-20 (×6): 650 mg via ORAL
  Filled 2023-04-16 (×6): qty 2

## 2023-04-16 MED ORDER — SODIUM CHLORIDE 0.9% FLUSH
3.0000 mL | Freq: Two times a day (BID) | INTRAVENOUS | Status: DC
  Administered 2023-04-16 – 2023-04-20 (×9): 3 mL via INTRAVENOUS

## 2023-04-16 MED ORDER — ONDANSETRON HCL 4 MG/2ML IJ SOLN: 4.0000 mg | Freq: Four times a day (QID) | INTRAMUSCULAR | Status: DC | PRN

## 2023-04-16 MED ORDER — HYDROCODONE-ACETAMINOPHEN 5-325 MG PO TABS
1.0000 | ORAL_TABLET | ORAL | Status: DC | PRN
  Administered 2023-04-18: 1 via ORAL
  Filled 2023-04-16: qty 1
  Filled 2023-04-16: qty 2

## 2023-04-16 MED ORDER — FLUTICASONE FUROATE-VILANTEROL 100-25 MCG/ACT IN AEPB
1.0000 | INHALATION_SPRAY | Freq: Every day | RESPIRATORY_TRACT | Status: DC
  Administered 2023-04-17 – 2023-04-20 (×4): 1 via RESPIRATORY_TRACT
  Filled 2023-04-16: qty 28

## 2023-04-16 MED ORDER — MIRABEGRON ER 25 MG PO TB24
25.0000 mg | ORAL_TABLET | Freq: Every day | ORAL | Status: DC
  Administered 2023-04-16 – 2023-04-20 (×5): 25 mg via ORAL
  Filled 2023-04-16 (×5): qty 1

## 2023-04-16 MED ORDER — VANCOMYCIN HCL 1500 MG/300ML IV SOLN
1500.0000 mg | Freq: Once | INTRAVENOUS | Status: AC
  Administered 2023-04-16: 1500 mg via INTRAVENOUS
  Filled 2023-04-16: qty 300

## 2023-04-16 MED ORDER — DAPAGLIFLOZIN PROPANEDIOL 10 MG PO TABS
10.0000 mg | ORAL_TABLET | Freq: Every day | ORAL | Status: DC
  Administered 2023-04-17 – 2023-04-20 (×4): 10 mg via ORAL
  Filled 2023-04-16 (×5): qty 1

## 2023-04-16 MED ORDER — IOHEXOL 350 MG/ML SOLN
75.0000 mL | Freq: Once | INTRAVENOUS | Status: AC | PRN
  Administered 2023-04-16: 75 mL via INTRAVENOUS

## 2023-04-16 MED ORDER — LABETALOL HCL 200 MG PO TABS
300.0000 mg | ORAL_TABLET | Freq: Two times a day (BID) | ORAL | Status: DC
  Administered 2023-04-16 – 2023-04-20 (×9): 300 mg via ORAL
  Filled 2023-04-16 (×9): qty 2

## 2023-04-16 MED ORDER — FAMOTIDINE 20 MG PO TABS
40.0000 mg | ORAL_TABLET | Freq: Every day | ORAL | Status: DC
  Administered 2023-04-16 – 2023-04-20 (×5): 40 mg via ORAL
  Filled 2023-04-16 (×6): qty 2

## 2023-04-16 MED ORDER — UMECLIDINIUM BROMIDE 62.5 MCG/ACT IN AEPB
1.0000 | INHALATION_SPRAY | Freq: Every day | RESPIRATORY_TRACT | Status: DC
  Administered 2023-04-16 – 2023-04-20 (×5): 1 via RESPIRATORY_TRACT
  Filled 2023-04-16: qty 7

## 2023-04-16 MED ORDER — LEVOTHYROXINE SODIUM 50 MCG PO TABS
100.0000 ug | ORAL_TABLET | Freq: Every day | ORAL | Status: DC
  Administered 2023-04-16 – 2023-04-20 (×5): 100 ug via ORAL
  Filled 2023-04-16 (×5): qty 2

## 2023-04-16 MED ORDER — PANTOPRAZOLE SODIUM 40 MG PO TBEC
40.0000 mg | DELAYED_RELEASE_TABLET | Freq: Two times a day (BID) | ORAL | Status: DC
  Administered 2023-04-16 – 2023-04-20 (×8): 40 mg via ORAL
  Filled 2023-04-16 (×8): qty 1

## 2023-04-16 MED ORDER — ONDANSETRON HCL 4 MG PO TABS: 4.0000 mg | ORAL_TABLET | Freq: Four times a day (QID) | ORAL | Status: DC | PRN

## 2023-04-16 MED ORDER — HYDROMORPHONE HCL 1 MG/ML IJ SOLN: 0.5000 mg | INTRAMUSCULAR | Status: DC | PRN

## 2023-04-16 MED ORDER — ATORVASTATIN CALCIUM 80 MG PO TABS
80.0000 mg | ORAL_TABLET | Freq: Every day | ORAL | Status: DC
  Administered 2023-04-16 – 2023-04-20 (×5): 80 mg via ORAL
  Filled 2023-04-16 (×5): qty 1

## 2023-04-16 NOTE — ED Notes (Signed)
ED TO INPATIENT HANDOFF REPORT  ED Nurse Name and Phone #:  Marcello Moores [phone]  S Name/Age/Gender [name] 77 y.o. male Room/Bed: 037C/037C  Code Status   Code Status: Prior  Home/SNF/Other Home Patient oriented to: self, place, time, and situation Is this baseline? Yes   Triage Complete: Triage complete  Chief Complaint Cellulitis of left hand [L03.114]  Triage Note Pt bib ems form home; pt had esophagus stretched 5/16; c/o pain L jaw and L wrist; family states weaker than normal, not been eating well, pt states he can't chew; family called surgeon, sent to ED for further eval; no n/v/d; only c/o jaw , neck, and wrist pain; 130/72, P 68, RR 17, 96% RA, cbg 138   Allergies Allergies  Allergen Reactions   Latex Rash and Other (See Comments)    Skin gets "blood raw and turns into sores"  Band Aid will tear off his skin   Codeine Other (See Comments)    Severe Headaches    Zolpidem Tartrate Other (See Comments)    Hallucinations   Hct [Hydrochlorothiazide] Other (See Comments)    Hypercalcemia    Metformin And Related Diarrhea and Other (See Comments)    Cannot tolerate in higher doses- has to drop from 2,000 mg daily to 1,500 mg daily to STOP the diarrhea that was occurring   Naproxen Diarrhea   Prednisone Diarrhea   Adhesive [Tape] Rash and Other (See Comments)    Redness, also (thinks he can tolerate paper tape)   Penicillins Rash and Other (See Comments)    Welts Has patient had a PCN reaction causing immediate rash, facial/tongue/throat swelling, SOB or lightheadedness with hypotension: Yes Has patient had a PCN reaction causing severe rash involving mucus membranes or skin necrosis: No Has patient had a PCN reaction that required hospitalization: No Has patient had a PCN reaction occurring within the last 10 years: No If all of the above answers are "NO", then may proceed with Cephalosporin use.     Level of Care/Admitting Diagnosis ED Disposition      ED Disposition  Admit   Condition  --   Comment  Hospital [facility] [[id number]]  Level of Care: Med-Surg [16]  [facility] to [facility] or [facility] if equivalent level of care is available:: Yes  Covid Evaluation: Confirmed COVID Negative  Diagnosis: Cellulitis of left hand [454098]  Admitting Physician: Briscoe Deutscher [[id number]]  Attending Physician: Briscoe Deutscher [[id number]]  Certification:: I certify this patient will need inpatient services for at least 2 midnights  Estimated Length of Stay: 3          B Medical/Surgery History Past Medical History:  Diagnosis Date   Abnormality of gait 03/31/2013   ALLERGIC RHINITIS 11/06/2007   Anemia, unspecified 08/22/2012   BENIGN PROSTATIC HYPERTROPHY 07/14/2007   CAD (coronary artery disease) 08/01/2022   Cardiomyopathy (HCC) 06/11/2022   Cervical spondylosis with radiculopathy 06/11/2022   Chronic osteomyelitis of toe of left foot (HCC) 09/12/2017   Chronic right shoulder pain 06/29/2022   COLONIC POLYPS, HX OF 07/14/2007   Complication of anesthesia    pt states he needs to be cathed after surgery   COVID-19    Degenerative arthritis of right knee 12/12/2011   Diabetes (HCC) 02/03/2013   Diet controlled   Diabetes mellitus due to underlying condition with unspecified complications (HCC) 06/11/2022   Disequilibrium 03/31/2013   Dyspnea 08/31/2019   Encounter for well adult exam with abnormal findings  12/10/2011   Essential hypertension 07/11/2007   Qualifier: Diagnosis of  By: Maris Berger    Former smoker 02/08/2015   GERD 07/11/2007   Hypercalcemia 08/12/2015   Hyperlipidemia    Hyperthyroidism    Hypervitaminosis D 01/14/2019   Hypothyroidism    Hypoxia    Lacunar infarction (HCC) 03/31/2013   Microalbuminuria 08/31/2019   Neck pain 03/26/2022   Neural foraminal stenosis of cervical spine 06/11/2022   OSTEOARTHRITIS, KNEE, RIGHT 10/27/2008   PERIPHERAL EDEMA  05/31/2009   Polyarthralgia 09/12/2017   Primary osteoarthritis of right shoulder 06/29/2022   PULMONARY NODULE 06/24/2008   granuloma   Recurrent falls 08/17/2016   Right shoulder pain 08/17/2016   Rotator cuff arthropathy 09/07/2016   Injected 09/07/2016 Repeat injection 01/07/17 Repeat injection April 20th 2018. Repeat injection 05/22/2017 Repeat injection 08/15/2017   Skin lesion 02/19/2017   Spinal stenosis in cervical region 03/31/2013   Spondylosis, cervical, with myelopathy 11/11/2012   TIA (transient ischemic attack) 08/13/2015   Upper back pain on right side 08/31/2019   Vitamin D deficiency 08/12/2015   Weight loss 08/31/2019   Past Surgical History:  Procedure Laterality Date   ANTERIOR CERVICAL DECOMP/DISCECTOMY FUSION N/A 01/06/2013   Procedure: ANTERIOR CERVICAL DECOMPRESSION/DISCECTOMY FUSION 1 LEVEL;  Surgeon: Maeola Harman, MD;  Location: [facility];  Service: Neurosurgery;  Laterality: N/A;  Cervical three-four Anterior cervical decompression/diskectomy/fusion   BACK SURGERY  1986   x 2  1986 C 3   ESOPHAGOGASTRODUODENOSCOPY (EGD) WITH PROPOFOL N/A 04/11/2023   Procedure: ESOPHAGOGASTRODUODENOSCOPY (EGD) WITH PROPOFOL;  Surgeon: Sherrilyn Rist, MD;  Location: [facility] ENDOSCOPY;  Service: Gastroenterology;  Laterality: N/A;   HERNIA REPAIR  sept 1986   left knee replacement  Nov 21, 2007   NECK SURGERY     NISSEN FUNDOPLICATION  2000   right heel bone spur  1987   TOTAL KNEE ARTHROPLASTY  01/09/2012   Procedure: TOTAL KNEE ARTHROPLASTY;  Surgeon: Jacki Cones, MD;  Location: [facility];  Service: Orthopedics;  Laterality: Right;     A IV Location/Drains/Wounds Patient Lines/Drains/Airways Status     Active Line/Drains/Airways     Name Placement date Placement time Site Days   Peripheral IV 04/16/23 20 G 1.88" Right;Upper Arm 04/16/23  0050  Arm  less than 1   Peripheral IV 04/16/23 20 G 1" Right Forearm 04/16/23  0045  Forearm  less than 1             Intake/Output Last 24 hours  Intake/Output Summary (Last 24 hours) at 04/16/2023 0419 Last data filed at 04/16/2023 3244 Gross per 24 hour  Intake 1402.54 ml  Output --  Net 1402.54 ml    Labs/Imaging Results for orders placed or performed during the hospital encounter of 04/15/23 (from the past 48 hour(s))  CBC with Differential     Status: Abnormal   Collection Time: 04/15/23  4:24 PM  Result Value Ref Range   WBC 15.5 (H) 4.0 - 10.5 K/uL   RBC 3.69 (L) 4.22 - 5.81 MIL/uL   Hemoglobin 10.9 (L) 13.0 - 17.0 g/dL   HCT 01.0 (L) 27.2 - 53.6 %   MCV 93.2 80.0 - 100.0 fL   MCH 29.5 26.0 - 34.0 pg   MCHC 31.7 30.0 - 36.0 g/dL   RDW 64.4 03.4 - 74.2 %   Platelets 419 (H) 150 - 400 K/uL   nRBC 0.0 0.0 - 0.2 %   Neutrophils Relative % 74 %   Neutro Abs 11.4 (  H) 1.7 - 7.7 K/uL   Lymphocytes Relative 9 %   Lymphs Abs 1.4 0.7 - 4.0 K/uL   Monocytes Relative 16 %   Monocytes Absolute 2.5 (H) 0.1 - 1.0 K/uL   Eosinophils Relative 0 %   Eosinophils Absolute 0.0 0.0 - 0.5 K/uL   Basophils Relative 0 %   Basophils Absolute 0.0 0.0 - 0.1 K/uL   Immature Granulocytes 1 %   Abs Immature Granulocytes 0.11 (H) 0.00 - 0.07 K/uL    Comment: Performed at [facility], [address], [place], [place] [address]  Comprehensive metabolic panel     Status: Abnormal   Collection Time: 04/15/23  4:24 PM  Result Value Ref Range   Sodium 139 135 - 145 mmol/L   Potassium 3.9 3.5 - 5.1 mmol/L   Chloride 105 98 - 111 mmol/L   CO2 24 22 - 32 mmol/L   Glucose, Bld 134 (H) 70 - 99 mg/dL    Comment: Glucose reference range applies only to samples taken after fasting for at least 8 hours.   BUN 10 8 - 23 mg/dL   Creatinine, Ser 0.27 0.61 - 1.24 mg/dL   Calcium 8.6 (L) 8.9 - 10.3 mg/dL   Total Protein 6.3 (L) 6.5 - 8.1 g/dL   Albumin 2.4 (L) 3.5 - 5.0 g/dL   AST 18 15 - 41 U/L   ALT 16 0 - 44 U/L   Alkaline Phosphatase 53 38 - 126 U/L   Total Bilirubin 0.8 0.3 - 1.2 mg/dL   GFR, Estimated  >25 >36 mL/min    Comment: (NOTE) Calculated using the CKD-EPI Creatinine Equation (2021)    Anion gap 10 5 - 15    Comment: Performed at [facility], [address], [place], [place] [address]  Troponin I (High Sensitivity)     Status: Abnormal   Collection Time: 04/15/23  4:24 PM  Result Value Ref Range   Troponin I (High Sensitivity) 28 (H) <18 ng/L    Comment: (NOTE) Elevated high sensitivity troponin I (hsTnI) values and significant  changes across serial measurements may suggest ACS but many other  chronic and acute conditions are known to elevate hsTnI results.  Refer to the "Links" section for chest pain algorithms and additional  guidance. Performed at [facility], [address], [place], [place] [address]   Troponin I (High Sensitivity)     Status: Abnormal   Collection Time: 04/15/23  8:37 PM  Result Value Ref Range   Troponin I (High Sensitivity) 23 (H) <18 ng/L    Comment: (NOTE) Elevated high sensitivity troponin I (hsTnI) values and significant  changes across serial measurements may suggest ACS but many other  chronic and acute conditions are known to elevate hsTnI results.  Refer to the "Links" section for chest pain algorithms and additional  guidance. Performed at [facility], [address], [place], [place] [address]   Lactic acid, plasma     Status: None   Collection Time: 04/16/23 12:55 AM  Result Value Ref Range   Lactic Acid, Venous 1.1 0.5 - 1.9 mmol/L    Comment: Performed at [facility], [address], [place], [place] [address]  Protime-INR     Status: Abnormal   Collection Time: 04/16/23 12:55 AM  Result Value Ref Range   Prothrombin Time 16.0 (H) 11.4 - 15.2 seconds   INR 1.3 (H) 0.8 - 1.2    Comment: (NOTE) INR goal varies  based on device and disease states. Performed at [facility], [address] [place], [place] [address]   APTT     Status: Abnormal   Collection Time: 04/16/23 12:55 AM  Result  Value Ref Range   aPTT 53 (H) 24 - 36 seconds    Comment:        IF BASELINE aPTT IS ELEVATED, SUGGEST PATIENT RISK ASSESSMENT BE USED TO DETERMINE APPROPRIATE ANTICOAGULANT THERAPY. Performed at [facility], [address], [place], [place] [address]   Resp panel by RT-PCR (RSV, Flu A&B, Covid) Anterior Nasal Swab     Status: None   Collection Time: 04/16/23  1:15 AM   Specimen: Anterior Nasal Swab  Result Value Ref Range   SARS Coronavirus 2 by RT PCR NEGATIVE NEGATIVE   Influenza A by PCR NEGATIVE NEGATIVE   Influenza B by PCR NEGATIVE NEGATIVE    Comment: (NOTE) The Xpert Xpress SARS-CoV-2/FLU/RSV plus assay is intended as an aid in the diagnosis of influenza from Nasopharyngeal swab specimens and should not be used as a sole basis for treatment. Nasal washings and aspirates are unacceptable for Xpert Xpress SARS-CoV-2/FLU/RSV testing.  Fact Sheet for Patients: BloggerCourse.com  Fact Sheet for Healthcare Providers: SeriousBroker.it  This test is not yet approved or cleared by the [place] FDA and has been authorized for detection and/or diagnosis of SARS-CoV-2 by FDA under an Emergency Use Authorization (EUA). This EUA will remain in effect (meaning this test can be used) for the duration of the COVID-19 declaration under Section 564(b)(1) of the Act, 21 U.S.C. section 360bbb-3(b)(1), unless the authorization is terminated or revoked.     Resp Syncytial Virus by PCR NEGATIVE NEGATIVE    Comment: (NOTE) Fact Sheet for Patients: BloggerCourse.com  Fact Sheet for Healthcare Providers: SeriousBroker.it  This test is not yet approved or cleared by the [place] FDA and has been authorized for detection and/or diagnosis of SARS-CoV-2 by FDA under an Emergency Use Authorization (EUA). This EUA will remain in effect (meaning this test can be used) for the  duration of the COVID-19 declaration under Section 564(b)(1) of the Act, 21 U.S.C. section 360bbb-3(b)(1), unless the authorization is terminated or revoked.  Performed at [facility], [address], [place], [place] [address]    CT C-SPINE NO CHARGE  Result Date: 04/16/2023 CLINICAL DATA:  Neck pain, prior cervical surgery. EXAM: CT Cervical Spine with contrast TECHNIQUE: Multiplanar CT images of the cervical spine were reconstructed from contemporary CT of the Neck. RADIATION DOSE REDUCTION: This exam was performed according to the departmental dose-optimization program which includes automated exposure control, adjustment of the mA and/or kV according to patient size and/or use of iterative reconstruction technique. CONTRAST:  None or No additional COMPARISON:  09/18/2018. FINDINGS: Alignment: There is mild anterolisthesis at C4-C5. Skull base and vertebrae: No acute fracture. Anterior cervical spinal fusion hardware is present at C3-C4 without evidence of hardware loosening. Soft tissues and spinal canal: No prevertebral fluid or swelling. No visible canal hematoma. Disc levels: Multilevel intervertebral disc space narrowing, degenerative endplate changes, and facet arthropathy. Upper chest: Emphysematous changes and scarring are noted at the lung apices, unchanged from the prior exam. Aortic atherosclerosis. Other: Please see CT neck with contrast for additional information. IMPRESSION: 1. Multilevel degenerative changes without evidence of acute fracture. 2. Anterior cervical spinal fusion hardware at C3-C4 without evidence of hardware loosening. Electronically Signed   By: Thornell Sartorius M.D.   On: 04/16/2023 03:05  CT Soft Tissue Neck W Contrast  Result Date: 04/16/2023 CLINICAL DATA:  Recent esophageal procedure.  Difficulty eating. EXAM: CT NECK WITH CONTRAST TECHNIQUE: Multidetector CT imaging of the neck was performed using the standard protocol following the bolus administration  of intravenous contrast. RADIATION DOSE REDUCTION: This exam was performed according to the departmental dose-optimization program which includes automated exposure control, adjustment of the mA and/or kV according to patient size and/or use of iterative reconstruction technique. CONTRAST:  75mL OMNIPAQUE IOHEXOL 350 MG/ML SOLN COMPARISON:  None Available. FINDINGS: Pharynx and larynx: There is asymmetric hypodensity within the right tongue. Oral cavity is otherwise unremarkable. The pharynx, larynx and epiglottis are normal. Salivary glands: No inflammation, mass, or stone. Thyroid: Normal. Lymph nodes: None enlarged or abnormal density. Vascular: Calcific aortic atherosclerosis. Limited intracranial: Negative. Visualized orbits: Negative. Mastoids and visualized paranasal sinuses: Clear. Skeleton: No acute or aggressive process.  C3-4 ACDF. Upper chest: Emphysema with biapical scarring and interstitial opacity. Other: None. IMPRESSION: 1. Asymmetric hypodensity within the right tongue, which could indicate a mass. Correlate with direct visualization/palpation. 2. No focal abnormality along the course of the esophagus. Aortic Atherosclerosis (ICD10-I70.0) and Emphysema (ICD10-J43.9). Electronically Signed   By: Deatra Robinson M.D.   On: 04/16/2023 02:24   DG Chest 2 View  Result Date: 04/15/2023 CLINICAL DATA:  Pain EXAM: CHEST - 2 VIEW COMPARISON:  04/10/2023 FINDINGS: Stable heart size. Prominent interstitial markings persist throughout the right lung, similar to prior. Probable small right pleural effusion. Left lung is clear. No pneumothorax. IMPRESSION: Persistent prominent interstitial markings throughout the right lung with probable small right pleural effusion. Electronically Signed   By: Duanne Guess D.O.   On: 04/15/2023 17:41   DG Hand Complete Left  Result Date: 04/15/2023 CLINICAL DATA:  Left wrist pain EXAM: LEFT HAND - COMPLETE 3 VIEW COMPARISON:  None Available. FINDINGS: No acute  fracture or dislocation. Preserved bone mineralization. Multifocal osteoarthritic changes identified along the interphalangeal joints as well as along the first and second metacarpophalangeal joints, the first carpometacarpal joint. Are also chondrocalcinosis seen about the wrist with some widening of the scapholunate space. Dedicated wrist x-rays could be considered when appropriate. IMPRESSION: Multifocal osteoarthritic changes about the hand and wrist. Chondrocalcinosis. Widening of the scapholunate space. Dedicated wrist x-rays could be considered when clinically appropriate to further delineate Electronically Signed   By: Karen Kays M.D.   On: 04/15/2023 17:39    Pending Labs Unresulted Labs (From admission, onward)     Start     Ordered   04/15/23 2341  Lactic acid, plasma  (Septic presentation on arrival (screening labs, nursing and treatment orders for obvious sepsis))  Now then every 2 hours,   R (with STAT occurrences)      04/15/23 2343   04/15/23 2341  Blood Culture (routine x 2)  (Septic presentation on arrival (screening labs, nursing and treatment orders for obvious sepsis))  BLOOD CULTURE X 2,   STAT      04/15/23 2343            Vitals/Pain Today's Vitals   04/15/23 2330 04/15/23 2339 04/16/23 0230 04/16/23 0352  BP: 138/72  (!) 145/81   Pulse: 73  73   Resp: 16  16   Temp:  98.9 F (37.2 C)  97.9 F (36.6 C)  TempSrc:      SpO2: 96%  97%   PainSc:        Isolation Precautions No active isolations  Medications Medications  vancomycin (VANCOREADY)  IVPB 1500 mg/300 mL (has no administration in time range)  sodium chloride 0.9 % bolus 1,000 mL ( Intravenous Stopped 04/16/23 0234)  ceFEPIme (MAXIPIME) 2 g in sodium chloride 0.9 % 100 mL IVPB (0 g Intravenous Stopped 04/16/23 0153)  vancomycin (VANCOREADY) IVPB 1500 mg/300 mL (0 mg Intravenous Stopped 04/16/23 0412)  iohexol (OMNIPAQUE) 350 MG/ML injection 75 mL (75 mLs Intravenous Contrast Given 04/16/23 0137)     Mobility walks with device     Focused Assessments    R Recommendations: See Admitting Provider Note  Report given to:   Additional Notes:

## 2023-04-16 NOTE — Progress Notes (Signed)
PT Cancellation Note  Patient Details Name: [name] MRN: [id number] DOB: 1946-03-26   Cancelled Treatment:    Reason Eval/Treat Not Completed: Fatigue/lethargy limiting ability to participate. Pt reports fatigue after recently returning to bed, requests PT return at a later time. PT will follow up as time allows.   Arlyss Gandy 04/16/2023, 5:26 PM

## 2023-04-16 NOTE — TOC Initial Note (Signed)
Transition of Care [facility]) - Initial/Assessment Note    Patient Details  Name: [name] MRN: [id number] Date of Birth: 1946/06/01  [facility] CM/SW Contact:    Durenda Guthrie, RN Phone Number: 04/16/2023, 8:38 AM  Clinical Narrative:                  Transition of Care [facility]) Department has reviewed patient and no TOC needs have been identified at this time. We will continue to monitor patient advancement through Interdisciplinary progressions and if new patient needs arise, please place a consult.       Patient Goals and CMS Choice            Expected Discharge Plan and Services                                              Prior Living Arrangements/Services                       Activities of Daily Living Home Assistive Devices/Equipment: Bedside commode/3-in-1, Grab bars in shower, Wheelchair, Eyeglasses, Cane (specify quad or straight), Dentures (specify type) (top denture) ADL Screening (condition at time of admission) Patient's cognitive ability adequate to safely complete daily activities?: Yes Is the patient deaf or have difficulty hearing?: No Does the patient have difficulty seeing, even when wearing glasses/contacts?: No Does the patient have difficulty concentrating, remembering, or making decisions?: No Patient able to express need for assistance with ADLs?: Yes Does the patient have difficulty dressing or bathing?: Yes Independently performs ADLs?: No Communication: Independent Dressing (OT): Independent Grooming: Independent Feeding: Independent Bathing: Independent Toileting: Independent In/Out Bed: Independent Walks in Home: Needs assistance Is this a change from baseline?: Pre-admission baseline Does the patient have difficulty walking or climbing stairs?: Yes Weakness of Legs: Both Weakness of Arms/Hands: Left  Permission Sought/Granted                  Emotional Assessment               Admission diagnosis:  Lymphangitis [I89.1] Cellulitis of left hand [L03.114] Patient Active Problem List   Diagnosis Date Noted   Cellulitis of left hand 04/16/2023   Acute blood loss anemia 04/11/2023   Cerebrovascular disease 04/11/2023   COPD (chronic obstructive pulmonary disease) (HCC) 04/11/2023   Hiatal hernia 04/11/2023   Hematemesis due to hiatal hernia/esophagitis 04/10/2023   GERD with esophagitis 04/10/2023   Hypokalemia 04/10/2023   Heart failure with reduced ejection fraction (HCC) 04/10/2023   Odynophagia 04/10/2023   CAD (coronary artery disease) 08/01/2022   Chronic right shoulder pain 06/29/2022   Primary osteoarthritis of right shoulder 06/29/2022   Diabetes mellitus due to underlying condition with unspecified complications (HCC) 06/11/2022   Cardiomyopathy (HCC) 06/11/2022   Neural foraminal stenosis of cervical spine 06/11/2022   Cervical spondylosis with radiculopathy 06/11/2022   Complication of anesthesia 05/10/2022   Neck pain 03/26/2022   COVID-19 12/22/2020   Hypoxia    Microalbuminuria 08/31/2019   Dyspnea 08/31/2019   Upper back pain on right side 08/31/2019   Abnormal loss of weight 08/31/2019   Hypervitaminosis D 01/14/2019   Polyarthralgia 09/12/2017   Chronic osteomyelitis of toe of left foot (HCC) 09/12/2017   Skin lesion 02/19/2017   Rotator cuff arthropathy 09/07/2016   Recurrent falls 08/17/2016   Right shoulder pain  08/17/2016   TIA (transient ischemic attack) 08/13/2015   Hypercalcemia 08/12/2015   Vitamin D deficiency 08/12/2015   Former smoker 02/08/2015   Spinal stenosis in cervical region 03/31/2013   Disequilibrium 03/31/2013   Lacunar infarction (HCC) 03/31/2013   Abnormality of gait 03/31/2013   Diabetes (HCC) 02/03/2013   Spondylosis, cervical, with myelopathy 11/11/2012   Anemia, unspecified 08/22/2012   Controlled type 2 diabetes mellitus without complication, without long-term current use of insulin (HCC) 08/22/2012    Degenerative arthritis of right knee 12/12/2011   Encounter for well adult exam with abnormal findings 12/10/2011   PERIPHERAL EDEMA 05/31/2009   OSTEOARTHRITIS, KNEE, RIGHT 10/27/2008   PULMONARY NODULE 06/24/2008   Hypothyroidism 11/06/2007   ALLERGIC RHINITIS 11/06/2007   BENIGN PROSTATIC HYPERTROPHY 07/14/2007   COLONIC POLYPS, HX OF 07/14/2007   Hyperthyroidism 07/14/2007   Hyperlipidemia 07/11/2007   Essential hypertension 07/11/2007   GERD 07/11/2007   PCP:  Eunice Blase, PA-C Pharmacy:   [facility] [address] [place] - [address] [place] [address] Phone: [phone] Fax: [phone]  [facility]) - [place], [place] - [address] [facility] [address] [place] [address] [place] [address] Phone: [phone] Fax: [phone]  [facility] [place], [place] - [address] [place] [address] Phone: [phone] Fax: [phone]  [facility] [place], [place] - [facility] [address] [place] [address] Phone: [phone] Fax: [phone]  [facility] [address] [place] [address] Phone: [phone] Fax: [phone]     Social Determinants of Health (SDOH) Social History: SDOH Screenings   Food Insecurity: No Food Insecurity (04/16/2023)  Housing: Low Risk  (04/16/2023)  Transportation Needs: No Transportation Needs (04/16/2023)  Utilities: Not At Risk (04/16/2023)  Depression (PHQ2-9): Low Risk  (08/31/2019)  Tobacco Use: Medium Risk (04/15/2023)   SDOH Interventions:     Readmission Risk Interventions     No data to display

## 2023-04-16 NOTE — Evaluation (Signed)
Occupational Therapy Evaluation Patient Details Name: [name] MRN: [id number] DOB: [birth year] Today's Date: 04/16/2023   History of Present Illness patient is a 77 y/o male S/P left hand cellulitis as well as recent onset of deconditioning/debility which started ~2 weeks ago.   Clinical Impression   Pt admitted with the above diagnosis. Pt currently with functional limitations due to the deficits listed below (see OT Problem List). Prior to admit, pt was living at home with his wife and family. Primarily, pt was mod I for BADL tasks although reports frequent falls. Endorses balance issues due to history of CVA's.  Pt will benefit from acute skilled OT to increase their safety and independence with ADL and functional mobility for ADL to facilitate discharge. Patient will benefit from continued inpatient follow up therapy, <3 hours/day due to history of frequent falls and wife only be able to provide supervision assist. OT will continue to work with patient acutely.         Recommendations for follow up therapy are one component of a multi-disciplinary discharge planning process, led by the attending physician.  Recommendations may be updated based on patient status, additional functional criteria and insurance authorization.   Assistance Recommended at Discharge Intermittent Supervision/Assistance  Patient can return home with the following A little help with walking and/or transfers;A little help with bathing/dressing/bathroom;Help with stairs or ramp for entrance;Assist for transportation;Assistance with cooking/housework    Functional Status Assessment  Patient has had a recent decline in their functional status and demonstrates the ability to make significant improvements in function in a reasonable and predictable amount of time.  Equipment Recommendations  None recommended by OT       Precautions / Restrictions Precautions Precautions: Fall Restrictions Weight Bearing  Restrictions: No      Mobility Bed Mobility Overal bed mobility: Needs Assistance Bed Mobility: Supine to Sit     Supine to sit: HOB elevated, Supervision     General bed mobility comments: increased need to complete task due to limited functional use of left hand. Utilized bed rail on the right side. Patient Response: Cooperative  Transfers Overall transfer level: Needs assistance Equipment used: Rolling walker (2 wheels) Transfers: Bed to chair/wheelchair/BSC, Sit to/from Stand Sit to Stand: Min assist, From elevated surface     Step pivot transfers: Min assist     General transfer comment: VC for hand placement during RW management and to minimize increased pain in left hand.      Balance Overall balance assessment: Needs assistance Sitting-balance support: Single extremity supported, Feet supported Sitting balance-Leahy Scale: Fair Sitting balance - Comments: sitting EOB Postural control: Posterior lean Standing balance support: Bilateral upper extremity supported, During functional activity, Reliant on assistive device for balance Standing balance-Leahy Scale: Poor          ADL either performed or assessed with clinical judgement   ADL Overall ADL's : Needs assistance/impaired     Grooming: Wash/dry hands;Wash/dry face;Oral care;Minimal assistance;Sitting   Upper Body Bathing: Minimal assistance;Sitting   Lower Body Bathing: Moderate assistance;Sit to/from stand;Sitting/lateral leans   Upper Body Dressing : Minimal assistance;Sitting   Lower Body Dressing: Moderate assistance;Sitting/lateral leans;Sit to/from stand   Toilet Transfer: Minimal assistance;Stand-pivot;Cueing for safety;Rolling walker (2 wheels);BSC/3in1 Toilet Transfer Details (indicate cue type and reason): simulated Toileting- Clothing Manipulation and Hygiene: Moderate assistance;Sit to/from stand;Sitting/lateral lean          Vision Baseline Vision/History: 1 Wears glasses Ability  to See in Adequate Light: 0 Adequate Patient Visual Report: No  change from baseline Vision Assessment?: No apparent visual deficits            Pertinent Vitals/Pain Pain Assessment Pain Assessment: Faces Faces Pain Scale: No hurt     Hand Dominance Left   Extremity/Trunk Assessment Upper Extremity Assessment Upper Extremity Assessment: RUE deficits/detail;LUE deficits/detail RUE Deficits / Details: Limited functional A/ROM of right shoulder due to chronic issues. Daughter reports that he fell on his right hand/wrist ~6 weeks ago and pt reports no deficits. LUE Deficits / Details: Cellulitis present in left hand/wrist with visable redness and warm to the touch. Unable to demonstrate a full functional grasp. no active wrist ROM demonstrated due to pain and presence of cellulitis. Elbow and shoulder WFL.   Lower Extremity Assessment Lower Extremity Assessment: Generalized weakness   Cervical / Trunk Assessment Cervical / Trunk Assessment: Kyphotic   Communication Communication Communication: No difficulties   Cognition Arousal/Alertness: Awake/alert Behavior During Therapy: WFL for tasks assessed/performed Overall Cognitive Status: Within Functional Limits for tasks assessed         General Comments  multiple small abrasions on bilateral shins and two on top of left foot.            Home Living Family/patient expects to be discharged to:: Private residence Living Arrangements: Spouse/significant other;Children (Daughter and Son-in-Law) Available Help at Discharge: Family;Available PRN/intermittently (daughter and son-in-law work and are gone for 10hrs during the day. Wife can not provide any physical assistance.) Type of Home: House Home Access: Ramped entrance     Home Layout: One level     Bathroom Shower/Tub: Producer, television/film/video: Standard Bathroom Accessibility: Yes How Accessible: Accessible via walker Home Equipment: Rolling Walker (2  wheels);Rollator (4 wheels);Shower seat;Wheelchair - manual;BSC/3in1;Other (comment);Hospital bed (walking sitcks)          Prior Functioning/Environment Prior Level of Function : Independent/Modified Independent;History of Falls (last six months);Needs assist       Physical Assist : Mobility (physical) Mobility (physical): Gait   Mobility Comments: Normally pt will walk up and down their ramp daily for exercise. Has not walked in the past 2 weeks. Does not use any AD for functional mobility although would benefit due to frequent falls. ADLs Comments: Mod I        OT Problem List: Decreased strength;Decreased safety awareness;Decreased activity tolerance;Impaired balance (sitting and/or standing);Decreased knowledge of use of DME or AE      OT Treatment/Interventions: Self-care/ADL training;Therapeutic exercise;Therapeutic activities;Neuromuscular education;Energy conservation;DME and/or AE instruction;Patient/family education;Balance training;Manual therapy    OT Goals(Current goals can be found in the care plan section) Acute Rehab OT Goals Patient Stated Goal: to sit up in the chair OT Goal Formulation: Patient unable to participate in goal setting Time For Goal Achievement: 04/30/23 Potential to Achieve Goals: Good  OT Frequency: Min 2X/week       AM-PAC OT "6 Clicks" Daily Activity     Outcome Measure Help from another person eating meals?: None Help from another person taking care of personal grooming?: A Little Help from another person toileting, which includes using toliet, bedpan, or urinal?: A Little Help from another person bathing (including washing, rinsing, drying)?: A Lot Help from another person to put on and taking off regular upper body clothing?: A Little Help from another person to put on and taking off regular lower body clothing?: A Lot 6 Click Score: 17   End of Session Equipment Utilized During Treatment: Gait belt;Rolling walker (2 wheels) Nurse  Communication: Mobility status  Activity Tolerance:  Patient tolerated treatment well Patient left: in chair;with call bell/phone within reach;with family/visitor present  OT Visit Diagnosis: Muscle weakness (generalized) (M62.81);Unsteadiness on feet (R26.81);History of falling (Z91.81)                Time: 1610-9604 OT Time Calculation (min): 32 min Charges:  OT General Charges $OT Visit: 1 Visit OT Evaluation $OT Eval Moderate Complexity: 1 Mod OT Treatments $Self Care/Home Management : 8-22 mins  Limmie Patricia, OTR/L,CBIS  Supplemental OT - [facility] and [facility] Secure Chat Preferred    Nathanyal Ashmead, Charisse March 04/16/2023, 2:53 PM

## 2023-04-16 NOTE — Progress Notes (Signed)
[facility] night cross cover note:   I was notified by RN of the patient's request for prn acetaminophen for pain in the setting of his presenting cellulitis.  I subsequently placed order for as needed acetaminophen.     Newton Pigg, DO Hospitalist

## 2023-04-16 NOTE — H&P (Signed)
History and Physical    Patient: [name] NFA:[id number] DOB: 1945-12-29 DOA: 04/15/2023 DOS: the patient was seen and examined on 04/16/2023 PCP: Eunice Blase, PA-C  Patient coming from: Home-lives with wife  Chief Complaint: Pain and redness left hand; jaw and neck pain.  HPI: [name] is a 77 y.o. male with medical history significant of hypothyroidism, BPH, HLD, GERD, diabetes mellitus, CAD and COPD.  Recently discharged on 5/17 after admission for hematemesis in the context of esophagitis.  Currently on soft diet.  Presented to the ED with reports of weakness, jaw neck and wrist pain.  In the ER patient was afebrile and hemodynamically stable.  No significant external abnormalities noted other than redness of right hand with swelling especially at the proximal first finger joint.  He also had tracking up the medial aspect from the palm to midway to the elbow flexor joint.  No significant pain in pain is currently controlled by Tylenol.  X-ray of the hand no evidence of osteomyelitis but he does have significant osteoarthritic changes of hand and wrist with chondrocalcinosis and widening of the scapholunate base with dedicated wrist x-rays to be considered.  Because of his left ear pain that radiates to the left jaw CT soft tissue of the head and neck was completed that revealed an asymmetric hypodensity within the right tongue which could indicate a mass without any other focal abnormality noted.  Upon my examination of the patient he was eating the recommended soft diet and still having some difficulty swallowing.  I was unable to palpate any abnormalities either on the right or left side of the tongue.  No external issues such as ulcerations or obvious swelling detected as well.  He continues to report left ear pain.  Patient will admitted as an inpatient to the hospital.   Review of Systems: As mentioned in the history of present illness. All other systems reviewed and are  negative. Past Medical History:  Diagnosis Date   Abnormality of gait 03/31/2013   ALLERGIC RHINITIS 11/06/2007   Anemia, unspecified 08/22/2012   BENIGN PROSTATIC HYPERTROPHY 07/14/2007   CAD (coronary artery disease) 08/01/2022   Cardiomyopathy (HCC) 06/11/2022   Cervical spondylosis with radiculopathy 06/11/2022   Chronic osteomyelitis of toe of left foot (HCC) 09/12/2017   Chronic right shoulder pain 06/29/2022   COLONIC POLYPS, HX OF 07/14/2007   Complication of anesthesia    pt states he needs to be cathed after surgery   COVID-19    Degenerative arthritis of right knee 12/12/2011   Diabetes (HCC) 02/03/2013   Diet controlled   Diabetes mellitus due to underlying condition with unspecified complications (HCC) 06/11/2022   Disequilibrium 03/31/2013   Dyspnea 08/31/2019   Encounter for well adult exam with abnormal findings 12/10/2011   Essential hypertension 07/11/2007   Qualifier: Diagnosis of  By: Maris Berger    Former smoker 02/08/2015   GERD 07/11/2007   Hypercalcemia 08/12/2015   Hyperlipidemia    Hyperthyroidism    Hypervitaminosis D 01/14/2019   Hypothyroidism    Hypoxia    Lacunar infarction (HCC) 03/31/2013   Microalbuminuria 08/31/2019   Neck pain 03/26/2022   Neural foraminal stenosis of cervical spine 06/11/2022   OSTEOARTHRITIS, KNEE, RIGHT 10/27/2008   PERIPHERAL EDEMA 05/31/2009   Polyarthralgia 09/12/2017   Primary osteoarthritis of right shoulder 06/29/2022   PULMONARY NODULE 06/24/2008   granuloma   Recurrent falls 08/17/2016   Right shoulder pain 08/17/2016   Rotator cuff arthropathy 09/07/2016   Injected  09/07/2016 Repeat injection 01/07/17 Repeat injection April 20th 2018. Repeat injection 05/22/2017 Repeat injection 08/15/2017   Skin lesion 02/19/2017   Spinal stenosis in cervical region 03/31/2013   Spondylosis, cervical, with myelopathy 11/11/2012   TIA (transient ischemic attack) 08/13/2015   Upper back pain on right side  08/31/2019   Vitamin D deficiency 08/12/2015   Weight loss 08/31/2019   Past Surgical History:  Procedure Laterality Date   ANTERIOR CERVICAL DECOMP/DISCECTOMY FUSION N/A 01/06/2013   Procedure: ANTERIOR CERVICAL DECOMPRESSION/DISCECTOMY FUSION 1 LEVEL;  Surgeon: Maeola Harman, MD;  Location: [facility];  Service: Neurosurgery;  Laterality: N/A;  Cervical three-four Anterior cervical decompression/diskectomy/fusion   BACK SURGERY  1986   x 2  1986 C 3   ESOPHAGOGASTRODUODENOSCOPY (EGD) WITH PROPOFOL N/A 04/11/2023   Procedure: ESOPHAGOGASTRODUODENOSCOPY (EGD) WITH PROPOFOL;  Surgeon: Sherrilyn Rist, MD;  Location: [facility] ENDOSCOPY;  Service: Gastroenterology;  Laterality: N/A;   HERNIA REPAIR  sept 1986   left knee replacement  Nov 21, 2007   NECK SURGERY     NISSEN FUNDOPLICATION  2000   right heel bone spur  1987   TOTAL KNEE ARTHROPLASTY  01/09/2012   Procedure: TOTAL KNEE ARTHROPLASTY;  Surgeon: Jacki Cones, MD;  Location: [facility];  Service: Orthopedics;  Laterality: Right;   Social History:  reports that he quit smoking about 40 years ago. His smoking use included cigarettes. He has a 90.00 pack-year smoking history. He has never used smokeless tobacco. He reports that he does not drink alcohol and does not use drugs.  Allergies  Allergen Reactions   Latex Rash and Other (See Comments)    Skin gets "blood raw and turns into sores"  Band Aid will tear off his skin   Codeine Other (See Comments)    Severe Headaches    Zolpidem Tartrate Other (See Comments)    Hallucinations   Hct [Hydrochlorothiazide] Other (See Comments)    Hypercalcemia    Metformin And Related Diarrhea and Other (See Comments)    Cannot tolerate in higher doses- has to drop from 2,000 mg daily to 1,500 mg daily to STOP the diarrhea that was occurring   Naproxen Diarrhea   Prednisone Diarrhea   Adhesive [Tape] Rash and Other (See Comments)    Redness, also (thinks he can tolerate paper tape)    Penicillins Rash and Other (See Comments)    Welts Has patient had a PCN reaction causing immediate rash, facial/tongue/throat swelling, SOB or lightheadedness with hypotension: Yes Has patient had a PCN reaction causing severe rash involving mucus membranes or skin necrosis: No Has patient had a PCN reaction that required hospitalization: No Has patient had a PCN reaction occurring within the last 10 years: No If all of the above answers are "NO", then may proceed with Cephalosporin use.     Family History  Problem Relation Age of Onset   Heart attack Mother    Heart attack Father    Emphysema Father    Heart disease Other    Diabetes Neg Hx    Thyroid disease Neg Hx    Hypercalcemia Neg Hx     Prior to Admission medications   Medication Sig Start Date End Date Taking? Authorizing Provider  ACCU-CHEK GUIDE test strip USE AS INSTRUCTED ONCE  DAILY 02/27/22   Corwin Levins, MD  Accu-Chek Softclix Lancets lancets USE AS DIRECTED ONCE DAILY 01/06/22   Corwin Levins, MD  albuterol (VENTOLIN HFA) 108 (90 Base) MCG/ACT inhaler Inhale 1-2 puffs  into the lungs every 6 (six) hours as needed for wheezing or shortness of breath.    [provider]  atorvastatin (LIPITOR) 80 MG tablet Take 1 tablet (80 mg total) by mouth daily. 04/05/21   Corwin Levins, MD  Blood Glucose Monitoring Suppl (ACCU-CHEK GUIDE) w/Device KIT 1 Device by Does not apply route daily. Once daily E11.9 02/29/20   Corwin Levins, MD  budesonide-formoterol [facility]) 160-4.5 MCG/ACT inhaler Inhale 2 puffs into the lungs 2 (two) times daily.    [provider]  cefdinir (OMNICEF) 300 MG capsule Take 1 capsule (300 mg total) by mouth 2 (two) times daily. 04/12/23   Danford, Earl Lites, MD  cetirizine (ZYRTEC) 10 MG tablet Take 1 tablet (10 mg total) by mouth daily. 08/31/19   Corwin Levins, MD  cloNIDine (CATAPRES) 0.1 MG tablet Take 1 tablet (0.1 mg total) by mouth daily. 11/16/22   Revankar, Aundra Dubin, MD   dapagliflozin propanediol (FARXIGA) 10 MG TABS tablet Take 1 tablet (10 mg total) by mouth daily before breakfast. 02/15/23   Revankar, Aundra Dubin, MD  famotidine (PEPCID) 40 MG tablet Take 40 mg by mouth daily. 03/05/22   [provider]  fenofibrate 160 MG tablet TAKE 1 TABLET BY MOUTH  DAILY Patient taking differently: Take 160 mg by mouth daily. 01/10/21   Corwin Levins, MD  fluticasone [facility]) 50 MCG/ACT nasal spray Place 1 spray into both nostrils as needed for allergies or rhinitis. 12/28/21   [provider]  gabapentin (NEURONTIN) 400 MG capsule TAKE 1 CAPSULE BY MOUTH 3  TIMES DAILY Patient not taking: Reported on 04/10/2023 09/22/20   Corwin Levins, MD  glucose blood test strip 1 each by Other route 2 (two) times daily. Use to check blood sugars twice a day Dx E11.9 03/14/15   Corwin Levins, MD  labetalol (NORMODYNE) 300 MG tablet TAKE 1 TABLET BY MOUTH  TWICE DAILY Patient taking differently: Take 300 mg by mouth 2 (two) times daily. 01/25/21   Corwin Levins, MD  levothyroxine (SYNTHROID) 100 MCG tablet Take 100 mcg by mouth daily before breakfast. 08/09/22   [provider]  LORazepam (ATIVAN) 1 MG tablet Take 0.5 tablets by mouth at bedtime. 06/21/22   [provider]  metFORMIN (GLUCOPHAGE-XR) 500 MG 24 hr tablet TAKE 4 TABLETS BY MOUTH  DAILY WITH BREAKFAST Patient taking differently: Take 500-1,500 mg by mouth See admin instructions. 3 am 1 pm 07/26/20   Corwin Levins, MD  montelukast (SINGULAIR) 10 MG tablet Take 10 mg by mouth daily. 03/01/22   [provider]  MYRBETRIQ 25 MG TB24 tablet Take 25 mg by mouth daily. 12/24/22   [provider]  nitroGLYCERIN (NITROSTAT) 0.4 MG SL tablet Place 0.4 mg under the tongue every 5 (five) minutes as needed for chest pain.    [provider]  pantoprazole (PROTONIX) 40 MG tablet Take 1 tablet (40 mg total) by mouth 2 (two) times daily before a meal. 04/12/23   Danford, Earl Lites, MD   sacubitril-valsartan (ENTRESTO) 97-103 MG Take 1 tablet by mouth 2 (two) times daily. 10/31/22   Revankar, Aundra Dubin, MD  spironolactone (ALDACTONE) 25 MG tablet Take 0.5 tablets (12.5 mg total) by mouth daily. 01/30/23   Revankar, Aundra Dubin, MD  TRELEGY ELLIPTA 100-62.5-25 MCG/ACT AEPB Take 1 puff by mouth daily. 04/01/23   [provider]    Physical Exam: Vitals:   04/16/23 0345 04/16/23 1610 04/16/23 9604 04/16/23 5409  BP: (!) 149/95  (!) 162/81 (!) 150/66  Pulse: 76  75 77  Resp: 20  (!) 22 18  Temp:  97.9 F (36.6 C)  98.4 F (36.9 C)  TempSrc:    Oral  SpO2: 96%  97% 98%   Constitutional: NAD, calm, comfortable Eyes: PERRL, lids and conjunctivae normal ENMT: Mucous membranes are moist. Posterior pharynx clear of any exudate or lesions.Normal dentition.  Inspection of tongue unremarkable.  Palpation of tongue unremarkable and unable to appreciate any masses down to the base of the tongue. Neck: normal, supple, no masses, no thyromegaly Respiratory: clear to auscultation bilaterally, no wheezing, no crackles. Normal respiratory effort. No accessory muscle use.  Cardiovascular: Regular rate and rhythm, no murmurs / rubs / gallops. No extremity edema. 2+ pedal pulses. Abdomen: no tenderness, no masses palpated. Bowel sounds positive.  Musculoskeletal: no clubbing / cyanosis. No joint deformity upper and lower extremities. Good ROM, no contractures. Normal muscle tone.  Skin: no rashes, lesions, ulcers. No induration Neurologic: CN 2-12 grossly intact. Sensation intact,  Strength 5/5 x all 4 extremities.  Psychiatric: Normal judgment and insight. Alert and oriented x 3. Normal mood.    Data Reviewed:  Sodium 139, potassium 3.9, glucose 134, BUN 10, creatinine 1.01, LFTs are normal, high-sensitivity troponin 28 and 23, procalcitonin within normal ranges, WBC 15,500, hemoglobin 10.9, platelets 419,000  Imaging as described above.  EKG revealed normal sinus rhythm with  predominant left bundle branch block which is unchanged from previous EKG  Assessment and Plan: Uncomplicated nonpurulent cellulitis left hand Has been given Maxipime and vancomycin in the ER. Check MRSA PCR and if negative can transition from vancomycin to monotherapy with IV Rocephin Treat acute pain with combination narcotic and nonnarcotic  medications No indications at this time to pursue CT/MRI of hand or consult orthopedic team   Diabetes mellitus 2 Currently controlled Hemoglobin A1c pending Follow CBGs and provide SSI Continue home Farxiga but hold preadmission metformin acutely  Question of mass right tongue I was unable to visualize nor palpate questionable mass Patient will need to follow-up with oral surgeon after discharge or ENT  Left ear pain Etiology unclear; possibly related to trigeminal neuralgia glossopharyngeal neuralgia  Minimally elevated troponin in patient with known CAD No chest pain and EKG unremarkable with stable left bundle branch block  HFrEF w/ diastolic dysfunction (EF 35 to 40% echo 2024) Continue labetalol and Entresto Hold Aldactone acutely Avoid overhydration Continue Farxiga as above to complete GDMT  COPD Continue home long-acting inhalers Currently asymptomatic  GERD with recent hematemesis in context of acute esophagitis Continue preadmission PPI and H2 blockers Continue soft diet  Hypothyroidism Continue Synthroid  HLD Continue fenofibrate and Lipitor      Advance Care Planning:   Code Status: Full Code   DVT prophylaxis: Lovenox  Consults: None  Family Communication: Wife at bedside  Severity of Illness: The appropriate patient status for this patient is INPATIENT. Inpatient status is judged to be reasonable and necessary in order to provide the required intensity of service to ensure the patient's safety. The patient's presenting symptoms, physical exam findings, and initial radiographic and laboratory data in the  context of their chronic comorbidities is felt to place them at high risk for further clinical deterioration. Furthermore, it is not anticipated that the patient will be medically stable for discharge from the hospital within 2 midnights of admission.   * I certify that at the point of admission it is my clinical judgment that the patient  will require inpatient hospital care spanning beyond 2 midnights from the point of admission due to high intensity of service, high risk for further deterioration and high frequency of surveillance required.*  Author: Junious Silk, NP 04/16/2023 6:55 AM  For on call review [email or web address].

## 2023-04-16 NOTE — Progress Notes (Signed)
Pharmacy Antibiotic Note  [name] is a 77 y.o. male admitted on 04/15/2023 with sepsis.  Pharmacy has been consulted for Vancomycin dosing. WBC is elevated. Renal function. Recent upper GI procedure.   Plan: Vancomycin 1500 mg IV q24h >>>Estimated AUC: 464 Cefepime x 1 in the ED Trend WBC, temp, renal function  F/U infectious work-up Drug levels as indicated   Temp (24hrs), Avg:98.9 F (37.2 C), Min:98.2 F (36.8 C), Max:99.5 F (37.5 C)  Recent Labs  Lab 04/09/23 2229 04/10/23 1253 04/11/23 0746 04/11/23 0958 04/12/23 0814 04/15/23 1624 04/16/23 0055  WBC 10.2  --   --  9.6  --  15.5*  --   CREATININE 1.13 0.95 0.70 0.89 0.88 1.01  --   LATICACIDVEN  --   --   --   --   --   --  1.1    Estimated Creatinine Clearance: 64.2 mL/min (by C-G formula based on SCr of 1.01 mg/dL).    Allergies  Allergen Reactions   Latex Rash and Other (See Comments)    Skin gets "blood raw and turns into sores"  Band Aid will tear off his skin   Codeine Other (See Comments)    Severe Headaches    Zolpidem Tartrate Other (See Comments)    Hallucinations   Hct [Hydrochlorothiazide] Other (See Comments)    Hypercalcemia    Metformin And Related Diarrhea and Other (See Comments)    Cannot tolerate in higher doses- has to drop from 2,000 mg daily to 1,500 mg daily to STOP the diarrhea that was occurring   Naproxen Diarrhea   Prednisone Diarrhea   Adhesive [Tape] Rash and Other (See Comments)    Redness, also (thinks he can tolerate paper tape)   Penicillins Rash and Other (See Comments)    Welts Has patient had a PCN reaction causing immediate rash, facial/tongue/throat swelling, SOB or lightheadedness with hypotension: Yes Has patient had a PCN reaction causing severe rash involving mucus membranes or skin necrosis: No Has patient had a PCN reaction that required hospitalization: No Has patient had a PCN reaction occurring within the last 10 years: No If all of the above answers  are "NO", then may proceed with Cephalosporin use.     Abran Duke, PharmD, BCPS Clinical Pharmacist Phone: [phone]

## 2023-04-16 NOTE — Plan of Care (Signed)

## 2023-04-16 NOTE — Progress Notes (Signed)
Patient arrived to the room via stretcher. Family present at bedside, eye glasses in place. Patient is alert and oriented x4, reported left hand pain that radiates to his neck and jaw, reported pain as 7/10. Patient requested tylenol for pain management, Newton Pigg, DO was notified. VS stable, on RA.  Patient was oriented to the staff and call bell. Bed brake in place.  [name]

## 2023-04-16 NOTE — Hospital Course (Addendum)
77 year old male with hypertension HLD diabetes hypothyroidism, history of stroke, HFrEF and recent admission for GI bleeding -in the context of esophagitis, taking soft diet presents with left hand pain, redness and streaking up in the arm.  In the ED leukocytosis normal lactic acid EKG with chronic LBBB, prolonged QTc, blood culture collected given IV fluids antibiotics started and admission was requested.  Complains of left ear pain, ongoing  some difficulty swallowing. Troponin 28> 23, stable renal function COVID-19 negative.  CT C-spine >> multilevel DJD, anterior cervical spinal fusion hardware at C3-C4 CT soft tissue neck done for lear pain>>Asymmetric hypodensity within the right tongue which could indicate a mass correlate direct visualization, no focal abnormality in the esophagus.  Seen and examined Some pain on left jaw area No hearing issues, left lower molar teeth filling came out a month ago- but no teeth pain No chest pain Daughter at the bedside complains that patient has been weak for few weeks and has not been able to stand or ambulate without assistance.

## 2023-04-16 NOTE — Consult Note (Signed)
Reason for Consult:Left hand pain Referring Physician: Lanae Boast Time called: 1138 Time at bedside: 1157   [name] is an 77 y.o. male.  HPI: Javeyon began to have left hand pain that woke from sleep Sunday night. He came to the ED yesterday and was admitted. The pain is stable since then. He denies prior e/o, hx/o gout, or fevers, chills, sweats, N/V. He is LHD.  Past Medical History:  Diagnosis Date   Abnormality of gait 03/31/2013   ALLERGIC RHINITIS 11/06/2007   Anemia, unspecified 08/22/2012   BENIGN PROSTATIC HYPERTROPHY 07/14/2007   CAD (coronary artery disease) 08/01/2022   Cardiomyopathy (HCC) 06/11/2022   Cervical spondylosis with radiculopathy 06/11/2022   Chronic osteomyelitis of toe of left foot (HCC) 09/12/2017   Chronic right shoulder pain 06/29/2022   COLONIC POLYPS, HX OF 07/14/2007   Complication of anesthesia    pt states he needs to be cathed after surgery   COVID-19    Degenerative arthritis of right knee 12/12/2011   Diabetes (HCC) 02/03/2013   Diet controlled   Diabetes mellitus due to underlying condition with unspecified complications (HCC) 06/11/2022   Disequilibrium 03/31/2013   Dyspnea 08/31/2019   Encounter for well adult exam with abnormal findings 12/10/2011   Essential hypertension 07/11/2007   Qualifier: Diagnosis of  By: Maris Berger    Former smoker 02/08/2015   GERD 07/11/2007   Hypercalcemia 08/12/2015   Hyperlipidemia    Hyperthyroidism    Hypervitaminosis D 01/14/2019   Hypothyroidism    Hypoxia    Lacunar infarction (HCC) 03/31/2013   Microalbuminuria 08/31/2019   Neck pain 03/26/2022   Neural foraminal stenosis of cervical spine 06/11/2022   OSTEOARTHRITIS, KNEE, RIGHT 10/27/2008   PERIPHERAL EDEMA 05/31/2009   Polyarthralgia 09/12/2017   Primary osteoarthritis of right shoulder 06/29/2022   PULMONARY NODULE 06/24/2008   granuloma   Recurrent falls 08/17/2016   Right shoulder pain 08/17/2016   Rotator cuff  arthropathy 09/07/2016   Injected 09/07/2016 Repeat injection 01/07/17 Repeat injection April 20th 2018. Repeat injection 05/22/2017 Repeat injection 08/15/2017   Skin lesion 02/19/2017   Spinal stenosis in cervical region 03/31/2013   Spondylosis, cervical, with myelopathy 11/11/2012   TIA (transient ischemic attack) 08/13/2015   Upper back pain on right side 08/31/2019   Vitamin D deficiency 08/12/2015   Weight loss 08/31/2019    Past Surgical History:  Procedure Laterality Date   ANTERIOR CERVICAL DECOMP/DISCECTOMY FUSION N/A 01/06/2013   Procedure: ANTERIOR CERVICAL DECOMPRESSION/DISCECTOMY FUSION 1 LEVEL;  Surgeon: Maeola Harman, MD;  Location: [facility];  Service: Neurosurgery;  Laterality: N/A;  Cervical three-four Anterior cervical decompression/diskectomy/fusion   BACK SURGERY  1986   x 2  1986 C 3   ESOPHAGOGASTRODUODENOSCOPY (EGD) WITH PROPOFOL N/A 04/11/2023   Procedure: ESOPHAGOGASTRODUODENOSCOPY (EGD) WITH PROPOFOL;  Surgeon: Sherrilyn Rist, MD;  Location: [facility] ENDOSCOPY;  Service: Gastroenterology;  Laterality: N/A;   HERNIA REPAIR  sept 1986   left knee replacement  Nov 21, 2007   NECK SURGERY     NISSEN FUNDOPLICATION  2000   right heel bone spur  1987   TOTAL KNEE ARTHROPLASTY  01/09/2012   Procedure: TOTAL KNEE ARTHROPLASTY;  Surgeon: Jacki Cones, MD;  Location: [facility]S;  Service: Orthopedics;  Laterality: Right;    Family History  Problem Relation Age of Onset   Heart attack Mother    Heart attack Father    Emphysema Father    Heart disease Other    Diabetes Neg Hx  Thyroid disease Neg Hx    Hypercalcemia Neg Hx     Social History:  reports that he quit smoking about 40 years ago. His smoking use included cigarettes. He has a 90.00 pack-year smoking history. He has never used smokeless tobacco. He reports that he does not drink alcohol and does not use drugs.  Allergies:  Allergies  Allergen Reactions   Latex Rash and Other (See Comments)    Skin  gets "blood raw and turns into sores"  Band Aid will tear off his skin   Codeine Other (See Comments)    Severe Headaches    Zolpidem Tartrate Other (See Comments)    Hallucinations   Hct [Hydrochlorothiazide] Other (See Comments)    Hypercalcemia    Metformin And Related Diarrhea and Other (See Comments)    Cannot tolerate in higher doses- has to drop from 2,000 mg daily to 1,500 mg daily to STOP the diarrhea that was occurring   Naproxen Diarrhea   Prednisone Diarrhea   Adhesive [Tape] Rash and Other (See Comments)    Redness, also (thinks he can tolerate paper tape)   Penicillins Rash and Other (See Comments)    Welts Has patient had a PCN reaction causing immediate rash, facial/tongue/throat swelling, SOB or lightheadedness with hypotension: Yes Has patient had a PCN reaction causing severe rash involving mucus membranes or skin necrosis: No Has patient had a PCN reaction that required hospitalization: No Has patient had a PCN reaction occurring within the last 10 years: No If all of the above answers are "NO", then may proceed with Cephalosporin use.     Medications: I have reviewed the patient's current medications.  Results for orders placed or performed during the hospital encounter of 04/15/23 (from the past 48 hour(s))  CBC with Differential     Status: Abnormal   Collection Time: 04/15/23  4:24 PM  Result Value Ref Range   WBC 15.5 (H) 4.0 - 10.5 K/uL   RBC 3.69 (L) 4.22 - 5.81 MIL/uL   Hemoglobin 10.9 (L) 13.0 - 17.0 g/dL   HCT 16.1 (L) 09.6 - 04.5 %   MCV 93.2 80.0 - 100.0 fL   MCH 29.5 26.0 - 34.0 pg   MCHC 31.7 30.0 - 36.0 g/dL   RDW 40.9 81.1 - 91.4 %   Platelets 419 (H) 150 - 400 K/uL   nRBC 0.0 0.0 - 0.2 %   Neutrophils Relative % 74 %   Neutro Abs 11.4 (H) 1.7 - 7.7 K/uL   Lymphocytes Relative 9 %   Lymphs Abs 1.4 0.7 - 4.0 K/uL   Monocytes Relative 16 %   Monocytes Absolute 2.5 (H) 0.1 - 1.0 K/uL   Eosinophils Relative 0 %   Eosinophils Absolute  0.0 0.0 - 0.5 K/uL   Basophils Relative 0 %   Basophils Absolute 0.0 0.0 - 0.1 K/uL   Immature Granulocytes 1 %   Abs Immature Granulocytes 0.11 (H) 0.00 - 0.07 K/uL    Comment: Performed at [facility], [address] [place], [place] [address]  Comprehensive metabolic panel     Status: Abnormal   Collection Time: 04/15/23  4:24 PM  Result Value Ref Range   Sodium 139 135 - 145 mmol/L   Potassium 3.9 3.5 - 5.1 mmol/L   Chloride 105 98 - 111 mmol/L   CO2 24 22 - 32 mmol/L   Glucose, Bld 134 (H) 70 - 99 mg/dL    Comment: Glucose reference range applies only to samples  taken after fasting for at least 8 hours.   BUN 10 8 - 23 mg/dL   Creatinine, Ser 0.86 0.61 - 1.24 mg/dL   Calcium 8.6 (L) 8.9 - 10.3 mg/dL   Total Protein 6.3 (L) 6.5 - 8.1 g/dL   Albumin 2.4 (L) 3.5 - 5.0 g/dL   AST 18 15 - 41 U/L   ALT 16 0 - 44 U/L   Alkaline Phosphatase 53 38 - 126 U/L   Total Bilirubin 0.8 0.3 - 1.2 mg/dL   GFR, Estimated >57 >84 mL/min    Comment: (NOTE) Calculated using the CKD-EPI Creatinine Equation (2021)    Anion gap 10 5 - 15    Comment: Performed at [facility], [address], [place], [place] [address]  Troponin I (High Sensitivity)     Status: Abnormal   Collection Time: 04/15/23  4:24 PM  Result Value Ref Range   Troponin I (High Sensitivity) 28 (H) <18 ng/L    Comment: (NOTE) Elevated high sensitivity troponin I (hsTnI) values and significant  changes across serial measurements may suggest ACS but many other  chronic and acute conditions are known to elevate hsTnI results.  Refer to the "Links" section for chest pain algorithms and additional  guidance. Performed at [facility], [address], [place], [place] [address]   Troponin I (High Sensitivity)     Status: Abnormal   Collection Time: 04/15/23  8:37 PM  Result Value Ref Range   Troponin I (High Sensitivity) 23 (H) <18 ng/L    Comment: (NOTE) Elevated high sensitivity troponin I (hsTnI) values  and significant  changes across serial measurements may suggest ACS but many other  chronic and acute conditions are known to elevate hsTnI results.  Refer to the "Links" section for chest pain algorithms and additional  guidance. Performed at [facility], [address], [place], [place] [address]   Lactic acid, plasma     Status: None   Collection Time: 04/16/23 12:55 AM  Result Value Ref Range   Lactic Acid, Venous 1.1 0.5 - 1.9 mmol/L    Comment: Performed at [facility], [address], [place], [place] [address]  Protime-INR     Status: Abnormal   Collection Time: 04/16/23 12:55 AM  Result Value Ref Range   Prothrombin Time 16.0 (H) 11.4 - 15.2 seconds   INR 1.3 (H) 0.8 - 1.2    Comment: (NOTE) INR goal varies based on device and disease states. Performed at [facility], [address] [place], [place] [address]   APTT     Status: Abnormal   Collection Time: 04/16/23 12:55 AM  Result Value Ref Range   aPTT 53 (H) 24 - 36 seconds    Comment:        IF BASELINE aPTT IS ELEVATED, SUGGEST PATIENT RISK ASSESSMENT BE USED TO DETERMINE APPROPRIATE ANTICOAGULANT THERAPY. Performed at [facility], [address] [place], [place] [address]   Resp panel by RT-PCR (RSV, Flu A&B, Covid) Anterior Nasal Swab     Status: None   Collection Time: 04/16/23  1:15 AM   Specimen: Anterior Nasal Swab  Result Value Ref Range   SARS Coronavirus 2 by RT PCR NEGATIVE NEGATIVE   Influenza A by PCR NEGATIVE NEGATIVE   Influenza B by PCR NEGATIVE NEGATIVE    Comment: (NOTE) The Xpert Xpress SARS-CoV-2/FLU/RSV plus assay is intended as an aid in the diagnosis of influenza from Nasopharyngeal  swab specimens and should not be used as a sole basis for treatment. Nasal washings and aspirates are unacceptable for Xpert Xpress SARS-CoV-2/FLU/RSV testing.  Fact Sheet for Patients: BloggerCourse.com  Fact Sheet for Healthcare  Providers: SeriousBroker.it  This test is not yet approved or cleared by the [place] FDA and has been authorized for detection and/or diagnosis of SARS-CoV-2 by FDA under an Emergency Use Authorization (EUA). This EUA will remain in effect (meaning this test can be used) for the duration of the COVID-19 declaration under Section 564(b)(1) of the Act, 21 U.S.C. section 360bbb-3(b)(1), unless the authorization is terminated or revoked.     Resp Syncytial Virus by PCR NEGATIVE NEGATIVE    Comment: (NOTE) Fact Sheet for Patients: BloggerCourse.com  Fact Sheet for Healthcare Providers: SeriousBroker.it  This test is not yet approved or cleared by the [place] FDA and has been authorized for detection and/or diagnosis of SARS-CoV-2 by FDA under an Emergency Use Authorization (EUA). This EUA will remain in effect (meaning this test can be used) for the duration of the COVID-19 declaration under Section 564(b)(1) of the Act, 21 U.S.C. section 360bbb-3(b)(1), unless the authorization is terminated or revoked.  Performed at [facility], [address] [place], [place] [address]   Lactic acid, plasma     Status: None   Collection Time: 04/16/23  3:50 AM  Result Value Ref Range   Lactic Acid, Venous 1.2 0.5 - 1.9 mmol/L    Comment: Performed at [facility], [address] [place], [place] [address]  Glucose, capillary     Status: None   Collection Time: 04/16/23  7:36 AM  Result Value Ref Range   Glucose-Capillary 98 70 - 99 mg/dL    Comment: Glucose reference range applies only to samples taken after fasting for at least 8 hours.  CBC with Differential/Platelet     Status: Abnormal   Collection Time: 04/16/23  8:53 AM  Result Value Ref Range   WBC 14.3 (H) 4.0 - 10.5 K/uL   RBC 3.32 (L) 4.22 - 5.81 MIL/uL   Hemoglobin 10.0 (L) 13.0 - 17.0 g/dL   HCT 09.8 (L) 11.9 - 14.7 %   MCV  92.2 80.0 - 100.0 fL   MCH 30.1 26.0 - 34.0 pg   MCHC 32.7 30.0 - 36.0 g/dL   RDW 82.9 56.2 - 13.0 %   Platelets 437 (H) 150 - 400 K/uL   nRBC 0.0 0.0 - 0.2 %   Neutrophils Relative % 77 %   Neutro Abs 11.0 (H) 1.7 - 7.7 K/uL   Lymphocytes Relative 8 %   Lymphs Abs 1.2 0.7 - 4.0 K/uL   Monocytes Relative 15 %   Monocytes Absolute 2.1 (H) 0.1 - 1.0 K/uL   Eosinophils Relative 0 %   Eosinophils Absolute 0.1 0.0 - 0.5 K/uL   Basophils Relative 0 %   Basophils Absolute 0.0 0.0 - 0.1 K/uL   Immature Granulocytes 0 %   Abs Immature Granulocytes 0.06 0.00 - 0.07 K/uL    Comment: Performed at [facility], [address] [place], [place] [address]  Basic metabolic panel     Status: Abnormal   Collection Time: 04/16/23  8:53 AM  Result Value Ref Range   Sodium 138 135 - 145 mmol/L   Potassium 3.7 3.5 - 5.1 mmol/L   Chloride 107 98 - 111 mmol/L   CO2 19 (L) 22 - 32 mmol/L   Glucose, Bld 99 70 - 99  mg/dL    Comment: Glucose reference range applies only to samples taken after fasting for at least 8 hours.   BUN 13 8 - 23 mg/dL   Creatinine, Ser 1.61 0.61 - 1.24 mg/dL   Calcium 8.3 (L) 8.9 - 10.3 mg/dL   GFR, Estimated >09 >60 mL/min    Comment: (NOTE) Calculated using the CKD-EPI Creatinine Equation (2021)    Anion gap 12 5 - 15    Comment: Performed at [facility], [address], [place], [place] [address]  Glucose, capillary     Status: Abnormal   Collection Time: 04/16/23 11:54 AM  Result Value Ref Range   Glucose-Capillary 112 (H) 70 - 99 mg/dL    Comment: Glucose reference range applies only to samples taken after fasting for at least 8 hours.    CT C-SPINE NO CHARGE  Result Date: 04/16/2023 CLINICAL DATA:  Neck pain, prior cervical surgery. EXAM: CT Cervical Spine with contrast TECHNIQUE: Multiplanar CT images of the cervical spine were reconstructed from contemporary CT of the Neck. RADIATION DOSE REDUCTION: This exam was performed according to the departmental  dose-optimization program which includes automated exposure control, adjustment of the mA and/or kV according to patient size and/or use of iterative reconstruction technique. CONTRAST:  None or No additional COMPARISON:  09/18/2018. FINDINGS: Alignment: There is mild anterolisthesis at C4-C5. Skull base and vertebrae: No acute fracture. Anterior cervical spinal fusion hardware is present at C3-C4 without evidence of hardware loosening. Soft tissues and spinal canal: No prevertebral fluid or swelling. No visible canal hematoma. Disc levels: Multilevel intervertebral disc space narrowing, degenerative endplate changes, and facet arthropathy. Upper chest: Emphysematous changes and scarring are noted at the lung apices, unchanged from the prior exam. Aortic atherosclerosis. Other: Please see CT neck with contrast for additional information. IMPRESSION: 1. Multilevel degenerative changes without evidence of acute fracture. 2. Anterior cervical spinal fusion hardware at C3-C4 without evidence of hardware loosening. Electronically Signed   By: Thornell Sartorius M.D.   On: 04/16/2023 03:05   CT Soft Tissue Neck W Contrast  Result Date: 04/16/2023 CLINICAL DATA:  Recent esophageal procedure.  Difficulty eating. EXAM: CT NECK WITH CONTRAST TECHNIQUE: Multidetector CT imaging of the neck was performed using the standard protocol following the bolus administration of intravenous contrast. RADIATION DOSE REDUCTION: This exam was performed according to the departmental dose-optimization program which includes automated exposure control, adjustment of the mA and/or kV according to patient size and/or use of iterative reconstruction technique. CONTRAST:  75mL OMNIPAQUE IOHEXOL 350 MG/ML SOLN COMPARISON:  None Available. FINDINGS: Pharynx and larynx: There is asymmetric hypodensity within the right tongue. Oral cavity is otherwise unremarkable. The pharynx, larynx and epiglottis are normal. Salivary glands: No inflammation, mass,  or stone. Thyroid: Normal. Lymph nodes: None enlarged or abnormal density. Vascular: Calcific aortic atherosclerosis. Limited intracranial: Negative. Visualized orbits: Negative. Mastoids and visualized paranasal sinuses: Clear. Skeleton: No acute or aggressive process.  C3-4 ACDF. Upper chest: Emphysema with biapical scarring and interstitial opacity. Other: None. IMPRESSION: 1. Asymmetric hypodensity within the right tongue, which could indicate a mass. Correlate with direct visualization/palpation. 2. No focal abnormality along the course of the esophagus. Aortic Atherosclerosis (ICD10-I70.0) and Emphysema (ICD10-J43.9). Electronically Signed   By: Deatra Robinson M.D.   On: 04/16/2023 02:24   DG Chest 2 View  Result Date: 04/15/2023 CLINICAL DATA:  Pain EXAM: CHEST - 2 VIEW COMPARISON:  04/10/2023 FINDINGS: Stable heart size. Prominent interstitial markings persist throughout the right lung, similar to prior.  Probable small right pleural effusion. Left lung is clear. No pneumothorax. IMPRESSION: Persistent prominent interstitial markings throughout the right lung with probable small right pleural effusion. Electronically Signed   By: Duanne Guess D.O.   On: 04/15/2023 17:41   DG Hand Complete Left  Result Date: 04/15/2023 CLINICAL DATA:  Left wrist pain EXAM: LEFT HAND - COMPLETE 3 VIEW COMPARISON:  None Available. FINDINGS: No acute fracture or dislocation. Preserved bone mineralization. Multifocal osteoarthritic changes identified along the interphalangeal joints as well as along the first and second metacarpophalangeal joints, the first carpometacarpal joint. Are also chondrocalcinosis seen about the wrist with some widening of the scapholunate space. Dedicated wrist x-rays could be considered when appropriate. IMPRESSION: Multifocal osteoarthritic changes about the hand and wrist. Chondrocalcinosis. Widening of the scapholunate space. Dedicated wrist x-rays could be considered when clinically  appropriate to further delineate Electronically Signed   By: Karen Kays M.D.   On: 04/15/2023 17:39    Review of Systems  HENT:  Negative for ear discharge, ear pain, hearing loss and tinnitus.   Eyes:  Negative for photophobia and pain.  Respiratory:  Negative for cough and shortness of breath.   Cardiovascular:  Negative for chest pain.  Gastrointestinal:  Negative for abdominal pain, nausea and vomiting.  Genitourinary:  Negative for dysuria, flank pain, frequency and urgency.  Musculoskeletal:  Positive for arthralgias (Left hand). Negative for back pain, myalgias and neck pain.  Neurological:  Negative for dizziness and headaches.  Hematological:  Does not bruise/bleed easily.  Psychiatric/Behavioral:  The patient is not nervous/anxious.    Blood pressure (!) 122/58, pulse 74, temperature 98.6 F (37 C), temperature source Oral, resp. rate 20, height 5\' 11"  (1.803 m), weight 74.2 kg, SpO2 95 %. Physical Exam Constitutional:      General: He is not in acute distress.    Appearance: He is well-developed. He is not diaphoretic.  HENT:     Head: Normocephalic and atraumatic.  Eyes:     General: No scleral icterus.       Right eye: No discharge.        Left eye: No discharge.     Conjunctiva/sclera: Conjunctivae normal.  Cardiovascular:     Rate and Rhythm: Normal rate and regular rhythm.  Pulmonary:     Effort: Pulmonary effort is normal. No respiratory distress.  Musculoskeletal:     Cervical back: Normal range of motion.     Comments: Left shoulder, elbow, wrist, digits- no skin wounds, mild TTP thenar/base of thumb with most TTP near 1st [facility] joint, minimal pain with PROM 1st [facility]/IP joints, mild edema and erythema, no instability, no blocks to motion  Sens  Ax/R/M/U intact  Mot   Ax/ R/ PIN/ M/ AIN/ U intact  Rad 2+  Skin:    General: Skin is warm and dry.  Neurological:     Mental Status: He is alert.  Psychiatric:        Mood and Affect: Mood normal.        Behavior:  Behavior normal.     Assessment/Plan: Left hand pain -- Do not see any surgical indication. Will get MRI to try to better characterize etiology.    Freeman Caldron, PA-C Orthopedic Surgery [phone] 04/16/2023, 12:10 PM

## 2023-04-17 DIAGNOSIS — L03114 Cellulitis of left upper limb: Secondary | ICD-10-CM

## 2023-04-17 LAB — SEDIMENTATION RATE: Sed Rate: 84 mm/hr — ABNORMAL HIGH (ref 0–16)

## 2023-04-17 LAB — GLUCOSE, CAPILLARY
Glucose-Capillary: 107 mg/dL — ABNORMAL HIGH (ref 70–99)
Glucose-Capillary: 147 mg/dL — ABNORMAL HIGH (ref 70–99)
Glucose-Capillary: 123 mg/dL — ABNORMAL HIGH (ref 70–99)
Glucose-Capillary: 118 mg/dL — ABNORMAL HIGH (ref 70–99)

## 2023-04-17 LAB — BASIC METABOLIC PANEL
Anion gap: 12 (ref 5–15)
BUN: 11 mg/dL (ref 8–23)
CO2: 18 mmol/L — ABNORMAL LOW (ref 22–32)
Calcium: 8.3 mg/dL — ABNORMAL LOW (ref 8.9–10.3)
Chloride: 107 mmol/L (ref 98–111)
Creatinine, Ser: 0.89 mg/dL (ref 0.61–1.24)
GFR, Estimated: >60 mL/min (ref >60)
Glucose, Bld: 111 mg/dL — ABNORMAL HIGH (ref 70–99)
Potassium: 3.6 mmol/L (ref 3.5–5.1)
Sodium: 137 mmol/L (ref 135–145)

## 2023-04-17 LAB — CBC
HCT: 28.1 % — ABNORMAL LOW (ref 39.0–52.0)
Hemoglobin: 9.3 g/dL — ABNORMAL LOW (ref 13.0–17.0)
MCH: 29.6 pg (ref 26.0–34.0)
MCHC: 33.1 g/dL (ref 30.0–36.0)
MCV: 89.5 fL (ref 80.0–100.0)
Platelets: 456 10*3/uL — ABNORMAL HIGH (ref 150–400)
RBC: 3.14 MIL/uL — ABNORMAL LOW (ref 4.22–5.81)
RDW: 14 % (ref 11.5–15.5)
WBC: 11.9 10*3/uL — ABNORMAL HIGH (ref 4.0–10.5)
nRBC: 0 % (ref 0.0–0.2)

## 2023-04-17 LAB — TSH: TSH: 6.842 u[IU]/mL — ABNORMAL HIGH (ref 0.350–4.500)

## 2023-04-17 LAB — URIC ACID: Uric Acid, Serum: 2.4 mg/dL — ABNORMAL LOW (ref 3.7–8.6)

## 2023-04-17 LAB — CULTURE, BLOOD (ROUTINE X 2): Culture: NO GROWTH

## 2023-04-17 LAB — VITAMIN D 25 HYDROXY (VIT D DEFICIENCY, FRACTURES): Vit D, 25-Hydroxy: 19.03 ng/mL — ABNORMAL LOW (ref 30–100)

## 2023-04-17 LAB — C-REACTIVE PROTEIN: CRP: 20.2 mg/dL — ABNORMAL HIGH (ref <1.0)

## 2023-04-17 LAB — VITAMIN B12: Vitamin B-12: 139 pg/mL — ABNORMAL LOW (ref 180–914)

## 2023-04-17 MED ORDER — VITAMIN D (ERGOCALCIFEROL) 1.25 MG (50000 UNIT) PO CAPS
50000.0000 [IU] | ORAL_CAPSULE | ORAL | Status: DC
  Administered 2023-04-17: 50000 [IU] via ORAL
  Filled 2023-04-17: qty 1

## 2023-04-17 MED ORDER — ENSURE ENLIVE PO LIQD
237.0000 mL | Freq: Three times a day (TID) | ORAL | Status: DC
  Administered 2023-04-17 – 2023-04-20 (×6): 237 mL via ORAL

## 2023-04-17 MED ORDER — ADULT MULTIVITAMIN W/MINERALS CH
1.0000 | ORAL_TABLET | Freq: Every day | ORAL | Status: DC
  Administered 2023-04-17 – 2023-04-20 (×4): 1 via ORAL
  Filled 2023-04-17 (×4): qty 1

## 2023-04-17 MED ORDER — CYANOCOBALAMIN 1000 MCG/ML IJ SOLN
1000.0000 ug | Freq: Once | INTRAMUSCULAR | Status: DC
  Filled 2023-04-17: qty 1

## 2023-04-17 NOTE — Progress Notes (Signed)
Initial Nutrition Assessment  DOCUMENTATION CODES:   Not applicable  INTERVENTION:  Ensure Enlive po TID, each supplement provides 350 kcal and 20 grams of protein. Magic cup TID with meals, each supplement provides 290 kcal and 9 grams of protein Encourage good PO intake  Recommend starting Vitamin D 1000 IU daily for 6-8 weeks due to deficiency.   NUTRITION DIAGNOSIS:   Inadequate oral intake related to acute illness as evidenced by per patient/family report.  GOAL:   Patient will meet greater than or equal to 90% of their needs  MONITOR:   PO intake, Supplement acceptance, Labs, I & O's, Weight trends  REASON FOR ASSESSMENT:   Consult Assessment of nutrition requirement/status  ASSESSMENT:   77 y.o. male presented to the ED with weakness, jaw/neck and wrist pain. PMH includes BPH, HLD, GERD, COPD, T2DM, and CAD. Pt admitted with cellulitis of L hand.  Pt laying in bed, family at bedside. Reports that his PO intake has been limited due to not being able to chew any foods due to pain. Also reports that he cannot do any meats or breads due to recent esophagitis. Able to tolerate soup, ice cream, and mashed potatoes. Had just started drinking Ensure two days PTA, pt was agreeable to the Ensures and prefers strawberry flavor. Currently is requiring feeding assist from family as pt is left handed and unable to feed himself.   Pt reports a UBW of 225# and that over the last 6-8 months has dropped to ~170#. Shared that he is currently going up and now at 174#. Family stated this weight loss was unintentional.  As pt was being cleaned up by staff, RD spoke with wife and daughter. Both agreeable to [employer] as it is easy to swallow. Family ok with calling down for meals as well. They would not like for any pureed foods at this time.   Reached out to MD regarding low Vitamin D level and recommending supplementation.   Medications reviewed and include: Vitamin B12, Pepcid, NovoLog  SSI, MVI, Protonix, IV antibiotics  Labs reviewed: Sodium 137, Potassium 3.6, Vitamin D 19.03 (L), Vitamin B12 139 (L), CRP 20.2 (H), Hgb A1c 6.1 CBG: 86-147 x 24 hours   NUTRITION - FOCUSED PHYSICAL EXAM: Deferred due to pt care.  Diet Order:   Diet Order             DIET SOFT Room service appropriate? Yes; Fluid consistency: Thin  Diet effective now                   EDUCATION NEEDS:   Education needs have been addressed  Skin:  Skin Assessment: Reviewed RN Assessment  Last BM:  5/19  Height:   Ht Readings from Last 1 Encounters:  04/16/23 5\' 11"  (1.803 m)    Weight:   Wt Readings from Last 1 Encounters:  04/16/23 74.2 kg    Ideal Body Weight:  78.2 kg  BMI:  Body mass index is 22.81 kg/m.  Estimated Nutritional Needs:  Kcal:  2000-2200 Protein:  100-115 grams Fluid:  >/= 2 L   Kirby Crigler RD, LDN Clinical Dietitian See [facility] for contact information.

## 2023-04-17 NOTE — Evaluation (Signed)
Physical Therapy Evaluation  Patient Details Name: [name] MRN: [id number] DOB: 15-Jun-1946 Today's Date: 04/17/2023  History of Present Illness  Patient is a 77 y/o male who presents S/P left hand cellulitis as well as recent onset of deconditioning/debility which started ~2 weeks ago. PMH significant for frequent falls, CAD, cardiomyopathy, osteomyelitis of toe L foot, R shoulder pain, DM, R knee OA, HTN, hypercalcemia, hyperthyroidism, hypoxia, lacunar infarct, rotator cuff arthropathy, ACDF 2014, back surgery 1986, L knee replacement, R TKA 2013.   Clinical Impression  Pt admitted with above diagnosis. Pt currently with functional limitations due to the deficits listed below (see PT Problem List). At the time of PT eval pt was able to perform transfers and ambulation with up to mod assist and RW for support. Family requesting post-acute rehab prior to d/c home. Feel this is reasonable to maximize functional independence and safety prior to return home with family support. Pt will benefit from acute skilled PT to increase their independence and safety with mobility to allow discharge.          Recommendations for follow up therapy are one component of a multi-disciplinary discharge planning process, led by the attending physician.  Recommendations may be updated based on patient status, additional functional criteria and insurance authorization.  Follow Up Recommendations Can patient physically be transported by private vehicle: Yes     Assistance Recommended at Discharge PRN  Patient can return home with the following  A lot of help with walking and/or transfers;A lot of help with bathing/dressing/bathroom;Assistance with cooking/housework;Assist for transportation;Help with stairs or ramp for entrance    Equipment Recommendations None recommended by PT  Recommendations for Other Services       Functional Status Assessment Patient has had a recent decline in their functional  status and demonstrates the ability to make significant improvements in function in a reasonable and predictable amount of time.     Precautions / Restrictions Precautions Precautions: Fall Restrictions Weight Bearing Restrictions: No      Mobility  Bed Mobility Overal bed mobility: Needs Assistance Bed Mobility: Rolling, Sidelying to Sit Rolling: Min assist Sidelying to sit: Mod assist       General bed mobility comments: Pt exiting bed on the R side and required mod assist for trunk elevation to full sitting position.    Transfers Overall transfer level: Needs assistance Equipment used: Rolling walker (2 wheels) Transfers: Sit to/from Stand Sit to Stand: Mod assist           General transfer comment: Assist for power up to full stand. VC's for L hand on walker due to pain and R hand to push up from bed to stand, and for hand placement on seated surface for stand>sit.    Ambulation/Gait Ambulation/Gait assistance: Min guard Gait Distance (Feet): 40 Feet Assistive device: Rolling walker (2 wheels) Gait Pattern/deviations: Decreased stride length, Step-through pattern, Trunk flexed, Narrow base of support Gait velocity: Decreased Gait velocity interpretation: <1.31 ft/sec, indicative of household ambulator   General Gait Details: Pt with very flexed trunk with cervical flexion noted and difficulty improving posture with cues. Pt ambulated out to the hall and then back to the room.  Stairs            Wheelchair Mobility    Modified Rankin (Stroke Patients Only)       Balance Overall balance assessment: Needs assistance Sitting-balance support: Single extremity supported, Feet supported Sitting balance-Leahy Scale: Fair Sitting balance - Comments: sitting EOB Postural control: Posterior  lean Standing balance support: Bilateral upper extremity supported, During functional activity, Reliant on assistive device for balance Standing balance-Leahy Scale:  Poor                               Pertinent Vitals/Pain Pain Assessment Pain Assessment: Faces Faces Pain Scale: No hurt    Home Living Family/patient expects to be discharged to:: Private residence Living Arrangements: Spouse/significant other;Children (Daughter and Son-in-Law) Available Help at Discharge: Family;Available PRN/intermittently (daughter and son-in-law work and are gone for 10hrs during the day. Wife can not provide any physical assistance.) Type of Home: House Home Access: Ramped entrance       Home Layout: One level Home Equipment: Agricultural consultant (2 wheels);Rollator (4 wheels);Shower seat;Wheelchair - manual;BSC/3in1;Other (comment);Hospital bed (walking sitcks)      Prior Function Prior Level of Function : Independent/Modified Independent;History of Falls (last six months);Needs assist       Physical Assist : Mobility (physical) Mobility (physical): Gait   Mobility Comments: Normally pt will walk up and down their ramp daily for exercise. Has not walked in the past 2 weeks. Does not use any AD for functional mobility although has been having frequent falls. ADLs Comments: Mod I     Hand Dominance   Dominant Hand: Left    Extremity/Trunk Assessment   Upper Extremity Assessment Upper Extremity Assessment: RUE deficits/detail RUE Deficits / Details: Limited functional A/ROM of right shoulder due to chronic issues. Daughter reports that he fell on his right hand/wrist ~6 weeks ago and pt reports no deficits. LUE Deficits / Details: Cellulitis present in left hand/wrist with visable redness and warm to the touch. Unable to demonstrate a full functional grasp. no active wrist ROM demonstrated due to pain and presence of cellulitis. Elbow and shoulder WFL.    Lower Extremity Assessment Lower Extremity Assessment: Generalized weakness    Cervical / Trunk Assessment Cervical / Trunk Assessment: Kyphotic (scoliosis?)  Communication    Communication: No difficulties  Cognition Arousal/Alertness: Awake/alert Behavior During Therapy: WFL for tasks assessed/performed Overall Cognitive Status: Within Functional Limits for tasks assessed                                          General Comments      Exercises Hand Exercises Wrist Flexion: AAROM, 10 reps, Left Wrist Extension: AAROM, 10 reps, Left Wrist Ulnar Deviation: AAROM, 10 reps, Left Wrist Radial Deviation: AAROM, 10 reps, Left Digit Composite Flexion: AROM, 10 reps, Left Composite Extension: AROM, 10 reps, Left Digit Composite Abduction: AROM, 10 reps, Left Digit Composite Adduction: AROM, 10 reps   Assessment/Plan    PT Assessment Patient needs continued PT services  PT Problem List Decreased strength;Decreased range of motion;Decreased activity tolerance;Decreased balance;Decreased mobility;Decreased knowledge of use of DME;Decreased safety awareness;Decreased knowledge of precautions;Pain       PT Treatment Interventions DME instruction;Gait training;Functional mobility training;Therapeutic activities;Therapeutic exercise;Balance training;Patient/family education    PT Goals (Current goals can be found in the Care Plan section)  Acute Rehab PT Goals Patient Stated Goal: Get stronger, go to rehab before home PT Goal Formulation: With patient Time For Goal Achievement: 04/24/23 Potential to Achieve Goals: Good    Frequency Min 2X/week     Co-evaluation               AM-PAC PT "6 Clicks" Mobility  Outcome Measure Help needed turning from your back to your side while in a flat bed without using bedrails?: A Little Help needed moving from lying on your back to sitting on the side of a flat bed without using bedrails?: A Lot Help needed moving to and from a bed to a chair (including a wheelchair)?: A Lot Help needed standing up from a chair using your arms (e.g., wheelchair or bedside chair)?: A Lot Help needed to walk in  hospital room?: A Little Help needed climbing 3-5 steps with a railing? : A Lot 6 Click Score: 14    End of Session Equipment Utilized During Treatment: Gait belt Activity Tolerance: Patient tolerated treatment well Patient left: in chair;with call bell/phone within reach;with nursing/sitter in room;with family/visitor present Nurse Communication: Mobility status PT Visit Diagnosis: Unsteadiness on feet (R26.81);Pain;Repeated falls (R29.6) Pain - Right/Left: Left Pain - part of body: Hand    Time: 1111-1140 PT Time Calculation (min) (ACUTE ONLY): 29 min   Charges:   PT Evaluation $PT Eval Moderate Complexity: 1 Mod PT Treatments $Gait Training: 8-22 mins        Conni Slipper, PT, DPT Acute Rehabilitation Services Secure Chat Preferred Office: [phone]   Marylynn Pearson 04/17/2023, 1:57 PM

## 2023-04-17 NOTE — Progress Notes (Signed)
PROGRESS NOTE    [name]  WUJ:[id number] DOB: 28-Nov-1945 DOA: 04/15/2023 PCP: Eunice Blase, PA-C    Brief Narrative:  77 year old with history of BPH, hyperlipidemia, GERD, type 2 diabetes, coronary artery disease and COPD.  Recently admitted with hematemesis in the context of esophagitis, status post EGD and discharged home on PPI presented back to the hospital with extreme weakness, left-sided jaw pain and left wrist pain.  In the emergency room afebrile and hemodynamically stable.  Found to have significant swelling of the right hand.  Soft tissue CT scan of the neck with no abnormality on the left side but reported hypodensity within the right tongue, clinically not present.  Admitted with deconditioning, left hand cellulitis and tenosynovitis.   Assessment & Plan:   Tenosynovitis of the left hand: Blood cultures pending.  Patient remains on broad-spectrum antibiotics with vancomycin and Rocephin. MRI consistent with tenosynovitis, likely inflammatory. Orthopedics following.  Will follow-up recommendations.  Continue mobility with PT OT.  Left ear pain/jaw pain: Possible TM joint pain.  No obvious abnormality on CT scan.  Symptomatic management.  Abnormal CT scan findings: Without any clinical evidence of tumor/tenderness on the right side of the tongue or buccal mucosa.  May just need follow-up.  GERD, acute esophagitis and recent hematemesis: Hemoglobin is stable.  On PPI and Pepcid.  Was asked to stay on soft diet for 2 weeks, he will advance diet tomorrow.  Deconditioning and debility: Significant debility.  Work with PT OT.  Refer to rehab. B12 deficiency, his B12 level is significantly low at 139.  Will replace with intramuscular injection while he is in the hospital.  On discharge, will prescribe weekly injections x 4 and monthly injection.  Likely with history of significant upper GI issues she has poor absorption.  Chronic medical issues including Chronic systolic  congestive heart failure, known ejection fraction 35 to 40%.  Resume his Aldactone and labetalol as well as Entresto. Type 2 diabetes: On sliding scale insulin. Hyperlipidemia, on fibrate simvastatin. Hypothyroidism: On Synthroid. COPD, stable on therapy.    DVT prophylaxis: enoxaparin (LOVENOX) injection 40 mg Start: 04/16/23 1000   Code Status: Full code Family Communication: Wife and daughter at the bedside Disposition Plan: Status is: Inpatient Remains inpatient appropriate because: Significant hand infection     Consultants:  Orthopedics  Procedures:  None  Antimicrobials:  Vancomycin Rocephin 5/21---   Subjective: Patient seen in the morning rounds.  Multiple complaints including left jaw pain which is slightly better today.  Patient stated that his redness and swelling is worse after manipulating hand while at MRI.  Remains afebrile.  Pain is controlled with Tylenol.  Objective: Vitals:   04/16/23 1907 04/17/23 0509 04/17/23 0740 04/17/23 0824  BP: 136/62 (!) 149/73 (!) 146/72   Pulse: 74 72 72 72  Resp: 20 18  16   Temp: 98.4 F (36.9 C) 97.7 F (36.5 C) 97.8 F (36.6 C)   TempSrc: Oral Oral Oral   SpO2: 96% 95% 97% 98%  Weight:      Height:        Intake/Output Summary (Last 24 hours) at 04/17/2023 1056 Last data filed at 04/17/2023 0300 Gross per 24 hour  Intake --  Output 700 ml  Net -700 ml   Filed Weights   04/16/23 0700  Weight: 74.2 kg    Examination:  General exam: Appears calm and comfortable  Frail and debilitated.  Chronically sick looking.  Not in any distress.  On room air. Respiratory system: Clear  to auscultation. Respiratory effort normal. Cardiovascular system: S1 & S2 heard, RRR.  Gastrointestinal system: Abdomen is nondistended, soft and nontender. No organomegaly or masses felt. Normal bowel sounds heard. Central nervous system: Alert and oriented. No focal neurological deficits.  Grossly weak. Extremities: Symmetric 5 x 5  power. Skin:  Patient has redness and mild edema dorsum of the hand, no palpable abscess or localized tenderness.  Has difficulty making fist on the left side due to pain.  Distal neurovascular status intact.  Oral exam: Patient has normal looking tongue on inspection and palpation.  There is no palpable tumor or mass.  Buccal mucosa without palpable tumor or abnormality.    Data Reviewed: I have personally reviewed following labs and imaging studies  CBC: Recent Labs  Lab 04/11/23 0746 04/11/23 0958 04/15/23 1624 04/16/23 0853 04/17/23 0331  WBC  --  9.6 15.5* 14.3* 11.9*  NEUTROABS  --   --  11.4* 11.0*  --   HGB 11.6* 10.6* 10.9* 10.0* 9.3*  HCT 34.0* 32.4* 34.4* 30.6* 28.1*  MCV  --  92.0 93.2 92.2 89.5  PLT  --  308 419* 437* 456*   Basic Metabolic Panel: Recent Labs  Lab 04/11/23 0958 04/12/23 0814 04/15/23 1624 04/16/23 0853 04/17/23 0331  NA 138 138 139 138 137  K 2.9* 3.3* 3.9 3.7 3.6  CL 103 105 105 107 107  CO2 23 24 24  19* 18*  GLUCOSE 95 107* 134* 99 111*  BUN 12 10 10 13 11   CREATININE 0.89 0.88 1.01 1.04 0.89  CALCIUM 8.2* 8.3* 8.6* 8.3* 8.3*  MG 1.8  --   --   --   --    GFR: Estimated Creatinine Clearance: 70.6 mL/min (by C-G formula based on SCr of 0.89 mg/dL). Liver Function Tests: Recent Labs  Lab 04/12/23 0814 04/15/23 1624  AST 28 18  ALT 23 16  ALKPHOS 43 53  BILITOT 0.6 0.8  PROT 5.8* 6.3*  ALBUMIN 2.5* 2.4*   No results for input(s): "LIPASE", "AMYLASE" in the last 168 hours. No results for input(s): "AMMONIA" in the last 168 hours. Coagulation Profile: Recent Labs  Lab 04/16/23 0055  INR 1.3*   Cardiac Enzymes: No results for input(s): "CKTOTAL", "CKMB", "CKMBINDEX", "TROPONINI" in the last 168 hours. BNP (last 3 results) No results for input(s): "PROBNP" in the last 8760 hours. HbA1C: Recent Labs    04/16/23 0853  HGBA1C 6.1*   CBG: Recent Labs  Lab 04/16/23 0736 04/16/23 1154 04/16/23 1544 04/16/23 2130  04/17/23 0809  GLUCAP 98 112* 86 124* 107*   Lipid Profile: No results for input(s): "CHOL", "HDL", "LDLCALC", "TRIG", "CHOLHDL", "LDLDIRECT" in the last 72 hours. Thyroid Function Tests: Recent Labs    04/17/23 0331  TSH 6.842*   Anemia Panel: Recent Labs    04/17/23 0331  VITAMINB12 139*   Sepsis Labs: Recent Labs  Lab 04/10/23 2132 04/12/23 0814 04/16/23 0055 04/16/23 0350  PROCALCITON <0.10 <0.10  --   --   LATICACIDVEN  --   --  1.1 1.2    Recent Results (from the past 240 hour(s))  Surgical pcr screen     Status: None   Collection Time: 04/10/23 10:30 PM   Specimen: Nasal Mucosa; Nasal Swab  Result Value Ref Range Status   MRSA, PCR NEGATIVE NEGATIVE Final   Staphylococcus aureus NEGATIVE NEGATIVE Final    Comment: (NOTE) The Xpert SA Assay (FDA approved for NASAL specimens in patients 95 years of age and older), is one  component of a comprehensive surveillance program. It is not intended to diagnose infection nor to guide or monitor treatment. Performed at [facility], [address], [place], [place] [address]   Blood Culture (routine x 2)     Status: None (Preliminary result)   Collection Time: 04/15/23 11:41 PM   Specimen: BLOOD  Result Value Ref Range Status   Specimen Description BLOOD SITE NOT SPECIFIED  Final   Special Requests   Final    BOTTLES DRAWN AEROBIC AND ANAEROBIC Blood Culture adequate volume   Culture   Final    NO GROWTH 1 DAY Performed at [facility], [address] [place], [place] [address]    Report Status PENDING  Incomplete  Resp panel by RT-PCR (RSV, Flu A&B, Covid) Anterior Nasal Swab     Status: None   Collection Time: 04/16/23  1:15 AM   Specimen: Anterior Nasal Swab  Result Value Ref Range Status   SARS Coronavirus 2 by RT PCR NEGATIVE NEGATIVE Final   Influenza A by PCR NEGATIVE NEGATIVE Final   Influenza B by PCR NEGATIVE NEGATIVE Final    Comment: (NOTE) The Xpert Xpress SARS-CoV-2/FLU/RSV plus  assay is intended as an aid in the diagnosis of influenza from Nasopharyngeal swab specimens and should not be used as a sole basis for treatment. Nasal washings and aspirates are unacceptable for Xpert Xpress SARS-CoV-2/FLU/RSV testing.  Fact Sheet for Patients: BloggerCourse.com  Fact Sheet for Healthcare Providers: SeriousBroker.it  This test is not yet approved or cleared by the [place] FDA and has been authorized for detection and/or diagnosis of SARS-CoV-2 by FDA under an Emergency Use Authorization (EUA). This EUA will remain in effect (meaning this test can be used) for the duration of the COVID-19 declaration under Section 564(b)(1) of the Act, 21 U.S.C. section 360bbb-3(b)(1), unless the authorization is terminated or revoked.     Resp Syncytial Virus by PCR NEGATIVE NEGATIVE Final    Comment: (NOTE) Fact Sheet for Patients: BloggerCourse.com  Fact Sheet for Healthcare Providers: SeriousBroker.it  This test is not yet approved or cleared by the [place] FDA and has been authorized for detection and/or diagnosis of SARS-CoV-2 by FDA under an Emergency Use Authorization (EUA). This EUA will remain in effect (meaning this test can be used) for the duration of the COVID-19 declaration under Section 564(b)(1) of the Act, 21 U.S.C. section 360bbb-3(b)(1), unless the authorization is terminated or revoked.  Performed at [facility], [address], [place], [place] [address]   Blood Culture (routine x 2)     Status: None (Preliminary result)   Collection Time: 04/16/23  1:15 AM   Specimen: BLOOD RIGHT FOREARM  Result Value Ref Range Status   Specimen Description BLOOD RIGHT FOREARM  Final   Special Requests   Final    BOTTLES DRAWN AEROBIC AND ANAEROBIC Blood Culture adequate volume   Culture   Final    NO GROWTH 1 DAY Performed at [facility]  Lab, [address] [place], [place] [address]    Report Status PENDING  Incomplete         Radiology Studies: MR HAND LEFT W WO CONTRAST  Result Date: 04/16/2023 CLINICAL DATA:  Hand pain, chronic, inflammatory arthritis suspected. EXAM: MRI OF THE LEFT HAND WITHOUT AND WITH CONTRAST TECHNIQUE: Multiplanar, multisequence MR imaging of the left hand was performed before and after the administration of intravenous contrast. CONTRAST:  7mL GADAVIST GADOBUTROL 1 MMOL/ML IV SOLN COMPARISON:  Left hand radiographs dated Apr 15, 2023 FINDINGS: Bones/Joint/Cartilage No marrow signal abnormality. No fracture or dislocation. Normal alignment. No joint effusion. No erosive changes. No periostitis. Degenerative changes with articular cartilage loss and subchondral cystic changes at the first carpometacarpal joint. There also cystic changes of the scaphoid, capitate and triquetrum. There also cystic changes about the ulnar styloid process Ligaments Collateral ligaments are intact. There is edema and irregularity of the dorsal scapholunate ligament concerning for ligamentous tear. Muscles and Tendons There is generalized edema about the flexor and extensor tendons suggesting tenosynovitis. There is edema about the extensor carpi ulnaris tendon suggesting tenosynovitis. Mild generalized edema of the hypothenar in thenar muscles. Soft tissue Marked subcutaneous soft tissue edema without evidence of fluid collection or abscess. No soft tissue mass. IMPRESSION: 1. Generalized edema about the flexor and extensor tendons suggesting tenosynovitis. There is also generalized subcutaneous soft tissue edema of the hand and digits. The findings are suspicious for inflammatory arthropathy. 2. Cystic changes of the scaphoid, capitate, triquetrum and ulnar styloid process concerning for inflammatory arthropathy. 3. Edema and irregularity of the dorsal scapholunate ligament concerning for ligamentous tear. 4. Mild generalized edema of  the hypothenar and thenar muscles suggesting myositis. Electronically Signed   By: Larose Hires D.O.   On: 04/16/2023 23:36   CT C-SPINE NO CHARGE  Result Date: 04/16/2023 CLINICAL DATA:  Neck pain, prior cervical surgery. EXAM: CT Cervical Spine with contrast TECHNIQUE: Multiplanar CT images of the cervical spine were reconstructed from contemporary CT of the Neck. RADIATION DOSE REDUCTION: This exam was performed according to the departmental dose-optimization program which includes automated exposure control, adjustment of the mA and/or kV according to patient size and/or use of iterative reconstruction technique. CONTRAST:  None or No additional COMPARISON:  09/18/2018. FINDINGS: Alignment: There is mild anterolisthesis at C4-C5. Skull base and vertebrae: No acute fracture. Anterior cervical spinal fusion hardware is present at C3-C4 without evidence of hardware loosening. Soft tissues and spinal canal: No prevertebral fluid or swelling. No visible canal hematoma. Disc levels: Multilevel intervertebral disc space narrowing, degenerative endplate changes, and facet arthropathy. Upper chest: Emphysematous changes and scarring are noted at the lung apices, unchanged from the prior exam. Aortic atherosclerosis. Other: Please see CT neck with contrast for additional information. IMPRESSION: 1. Multilevel degenerative changes without evidence of acute fracture. 2. Anterior cervical spinal fusion hardware at C3-C4 without evidence of hardware loosening. Electronically Signed   By: Thornell Sartorius M.D.   On: 04/16/2023 03:05   CT Soft Tissue Neck W Contrast  Result Date: 04/16/2023 CLINICAL DATA:  Recent esophageal procedure.  Difficulty eating. EXAM: CT NECK WITH CONTRAST TECHNIQUE: Multidetector CT imaging of the neck was performed using the standard protocol following the bolus administration of intravenous contrast. RADIATION DOSE REDUCTION: This exam was performed according to the departmental  dose-optimization program which includes automated exposure control, adjustment of the mA and/or kV according to patient size and/or use of iterative reconstruction technique. CONTRAST:  75mL OMNIPAQUE IOHEXOL 350 MG/ML SOLN COMPARISON:  None Available. FINDINGS: Pharynx and larynx: There is asymmetric hypodensity within the right tongue. Oral cavity is otherwise unremarkable. The pharynx, larynx and epiglottis are normal. Salivary glands: No inflammation, mass, or stone. Thyroid: Normal. Lymph nodes: None enlarged or abnormal density. Vascular: Calcific aortic atherosclerosis. Limited intracranial: Negative. Visualized orbits: Negative. Mastoids and visualized paranasal sinuses: Clear. Skeleton: No acute or aggressive process.  C3-4 ACDF. Upper chest: Emphysema with biapical scarring and interstitial opacity. Other: None. IMPRESSION: 1. Asymmetric hypodensity within  the right tongue, which could indicate a mass. Correlate with direct visualization/palpation. 2. No focal abnormality along the course of the esophagus. Aortic Atherosclerosis (ICD10-I70.0) and Emphysema (ICD10-J43.9). Electronically Signed   By: Deatra Robinson M.D.   On: 04/16/2023 02:24   DG Chest 2 View  Result Date: 04/15/2023 CLINICAL DATA:  Pain EXAM: CHEST - 2 VIEW COMPARISON:  04/10/2023 FINDINGS: Stable heart size. Prominent interstitial markings persist throughout the right lung, similar to prior. Probable small right pleural effusion. Left lung is clear. No pneumothorax. IMPRESSION: Persistent prominent interstitial markings throughout the right lung with probable small right pleural effusion. Electronically Signed   By: Duanne Guess D.O.   On: 04/15/2023 17:41   DG Hand Complete Left  Result Date: 04/15/2023 CLINICAL DATA:  Left wrist pain EXAM: LEFT HAND - COMPLETE 3 VIEW COMPARISON:  None Available. FINDINGS: No acute fracture or dislocation. Preserved bone mineralization. Multifocal osteoarthritic changes identified along the  interphalangeal joints as well as along the first and second metacarpophalangeal joints, the first carpometacarpal joint. Are also chondrocalcinosis seen about the wrist with some widening of the scapholunate space. Dedicated wrist x-rays could be considered when appropriate. IMPRESSION: Multifocal osteoarthritic changes about the hand and wrist. Chondrocalcinosis. Widening of the scapholunate space. Dedicated wrist x-rays could be considered when clinically appropriate to further delineate Electronically Signed   By: Karen Kays M.D.   On: 04/15/2023 17:39        Scheduled Meds:  atorvastatin  80 mg Oral Daily   cyanocobalamin  1,000 mcg Intramuscular Once   dapagliflozin propanediol  10 mg Oral QAC breakfast   enoxaparin (LOVENOX) injection  40 mg Subcutaneous Q24H   famotidine  40 mg Oral Daily   fenofibrate  160 mg Oral Daily   fluticasone furoate-vilanterol  1 puff Inhalation Daily   And   umeclidinium bromide  1 puff Inhalation Daily   insulin aspart  0-5 Units Subcutaneous QHS   insulin aspart  0-9 Units Subcutaneous TID WC   labetalol  300 mg Oral BID   levothyroxine  100 mcg Oral Q0600   mirabegron ER  25 mg Oral Daily   pantoprazole  40 mg Oral BID AC   sacubitril-valsartan  1 tablet Oral BID   sodium chloride flush  3 mL Intravenous Q12H   Continuous Infusions:  ceFEPime (MAXIPIME) IV 2 g (04/17/23 0912)   vancomycin 1,500 mg (04/16/23 2140)     LOS: 1 day    Time spent: 35 minutes    Dorcas Carrow, MD [facility] Pager [phone]

## 2023-04-17 NOTE — Progress Notes (Signed)
Patient able to sit in chair for lunch time and ambulate to the bathroom/bed via one person and a walker. RN educated patient on how to use the IS and patient verbalized and demonstrated understanding.

## 2023-04-17 NOTE — TOC Progression Note (Signed)
Transition of Care [facility]) - Progression Note    Patient Details  Name: DEVAL MCFARREN MRN: [id number] Date of Birth: [birth year]  [facility]) CM/SW Contact  Kindal Ponti A [place], [place] Phone Number: 04/17/2023, 2:45 PM  Clinical Narrative:     CSW met with pt and pt's family, daughter and wife were at bedside to discuss placement selection. Bed choice was for [facility]. Bed available on Saturday 5/25. Provider informed. DC pending bed availability and insurance authorization.   TOC will continue to follow.   CSW met with pt and pt's family, daughter Aggie Cosier and spouse, Lucendia Herrlich, at bedside.   Pt was in the restroom during most of conversation but family said that pt and family had previous discussion and pt was ok and coming to terms with SNF recommendation.   Pt's daughter reported that preference was for [facility].   TOC will begin process for SNF workup.   Discharge pending bed offer, insurance authorization and continued medical work up.   TOC will continue to follow.        Expected Discharge Plan and Services                                               Social Determinants of Health (SDOH) Interventions SDOH Screenings   Food Insecurity: No Food Insecurity (04/16/2023)  Housing: Low Risk  (04/16/2023)  Transportation Needs: No Transportation Needs (04/16/2023)  Utilities: Not At Risk (04/16/2023)  Depression (PHQ2-9): Low Risk  (08/31/2019)  Tobacco Use: Medium Risk (04/15/2023)    Readmission Risk Interventions     No data to display

## 2023-04-18 DIAGNOSIS — L03114 Cellulitis of left upper limb: Secondary | ICD-10-CM | POA: Diagnosis not present

## 2023-04-18 LAB — GLUCOSE, CAPILLARY
Glucose-Capillary: 112 mg/dL — ABNORMAL HIGH (ref 70–99)
Glucose-Capillary: 106 mg/dL — ABNORMAL HIGH (ref 70–99)
Glucose-Capillary: 119 mg/dL — ABNORMAL HIGH (ref 70–99)
Glucose-Capillary: 125 mg/dL — ABNORMAL HIGH (ref 70–99)

## 2023-04-18 LAB — CULTURE, BLOOD (ROUTINE X 2)

## 2023-04-18 MED ORDER — CYANOCOBALAMIN 1000 MCG/ML IJ SOLN
1000.0000 ug | Freq: Once | INTRAMUSCULAR | Status: AC
  Administered 2023-04-18: 1000 ug via INTRAMUSCULAR
  Filled 2023-04-18: qty 1

## 2023-04-18 NOTE — [place] FL2 (Signed)
[place] MEDICAID FL2 LEVEL OF CARE FORM     IDENTIFICATION  Patient Name: ALEIX KARMEL Birthdate: [birth year] Sex: male Admission Date (Current Location): 04/15/2023  [facility] and [place] Number:  [employer] and Address:  [place], [address] [place], [place] [address]      Provider Number: [id number]  Attending Physician Name and Address:  Dorcas Carrow, MD  Relative Name and Phone Number:  Richardson,Crystal (Daughter)  [phone]    Current Level of Care: Hospital Recommended Level of Care: Skilled Nursing Facility Prior Approval Number:    Date Approved/Denied:   PASRR Number: [id number]  Discharge Plan: SNF    Current Diagnoses: Patient Active Problem List   Diagnosis Date Noted   Cellulitis of left hand 04/16/2023   Acute blood loss anemia 04/11/2023   Cerebrovascular disease 04/11/2023   COPD (chronic obstructive pulmonary disease) (HCC) 04/11/2023   Hiatal hernia 04/11/2023   Hematemesis due to hiatal hernia/esophagitis 04/10/2023   GERD with esophagitis 04/10/2023   Hypokalemia 04/10/2023   Heart failure with reduced ejection fraction (HCC) 04/10/2023   Odynophagia 04/10/2023   CAD (coronary artery disease) 08/01/2022   Chronic right shoulder pain 06/29/2022   Primary osteoarthritis of right shoulder 06/29/2022   Diabetes mellitus due to underlying condition with unspecified complications (HCC) 06/11/2022   Cardiomyopathy (HCC) 06/11/2022   Neural foraminal stenosis of cervical spine 06/11/2022   Cervical spondylosis with radiculopathy 06/11/2022   Complication of anesthesia 05/10/2022   Neck pain 03/26/2022   COVID-19 12/22/2020   Hypoxia    Microalbuminuria 08/31/2019   Dyspnea 08/31/2019   Upper back pain on right side 08/31/2019   Abnormal loss of weight 08/31/2019   Hypervitaminosis D 01/14/2019   Polyarthralgia 09/12/2017   Chronic osteomyelitis of toe of left foot (HCC) 09/12/2017   Skin  lesion 02/19/2017   Rotator cuff arthropathy 09/07/2016   Recurrent falls 08/17/2016   Right shoulder pain 08/17/2016   TIA (transient ischemic attack) 08/13/2015   Hypercalcemia 08/12/2015   Vitamin D deficiency 08/12/2015   Former smoker 02/08/2015   Spinal stenosis in cervical region 03/31/2013   Disequilibrium 03/31/2013   Lacunar infarction (HCC) 03/31/2013   Abnormality of gait 03/31/2013   Diabetes (HCC) 02/03/2013   Spondylosis, cervical, with myelopathy 11/11/2012   Anemia, unspecified 08/22/2012   Controlled type 2 diabetes mellitus without complication, without long-term current use of insulin (HCC) 08/22/2012   Degenerative arthritis of right knee 12/12/2011   Encounter for well adult exam with abnormal findings 12/10/2011   PERIPHERAL EDEMA 05/31/2009   OSTEOARTHRITIS, KNEE, RIGHT 10/27/2008   PULMONARY NODULE 06/24/2008   Hypothyroidism 11/06/2007   ALLERGIC RHINITIS 11/06/2007   BENIGN PROSTATIC HYPERTROPHY 07/14/2007   COLONIC POLYPS, HX OF 07/14/2007   Hyperthyroidism 07/14/2007   Hyperlipidemia 07/11/2007   Essential hypertension 07/11/2007   GERD 07/11/2007    Orientation RESPIRATION BLADDER Height & Weight     Self, Time, Situation, Place  Normal Continent Weight: 163 lb 9.3 oz (74.2 kg) Height:  5\' 11"  (180.3 cm)  BEHAVIORAL SYMPTOMS/MOOD NEUROLOGICAL BOWEL NUTRITION STATUS      Continent Diet (see discharge summary)  AMBULATORY STATUS COMMUNICATION OF NEEDS Skin   Limited Assist Verbally Normal                       Personal Care Assistance Level of Assistance  Bathing, Feeding, Dressing Bathing Assistance: Maximum assistance Feeding assistance: Independent Dressing Assistance: Maximum assistance  Functional Limitations Info  Sight, Hearing, Speech Sight Info: Impaired (wears glassess) Hearing Info: Impaired Speech Info: Adequate    SPECIAL CARE FACTORS FREQUENCY  PT (By licensed PT), OT (By licensed OT)     PT Frequency:  5x/week OT Frequency: 5x/week            Contractures Contractures Info: Not present    Additional Factors Info  Code Status, Allergies, Insulin Sliding Scale Code Status Info: FULL Allergies Info: Latex  Codeine  Zolpidem Tartrate  Hct (Hydrochlorothiazide)  Metformin And Related  Naproxen  Prednisone  Adhesive (Tape)  Penicillins   Insulin Sliding Scale Info: see discharge summary       Current Medications (04/18/2023):  This is the current hospital active medication list Current Facility-Administered Medications  Medication Dose Route Frequency Provider Last Rate Last Admin   acetaminophen (TYLENOL) tablet 650 mg  650 mg Oral Q6H PRN Howerter, Justin B, DO   650 mg at 04/17/23 1744   albuterol (PROVENTIL) (2.5 MG/3ML) 0.083% nebulizer solution 3 mL  3 mL Inhalation Q6H PRN Russella Dar, NP       atorvastatin (LIPITOR) tablet 80 mg  80 mg Oral Daily Russella Dar, NP   80 mg at 04/18/23 0859   ceFEPIme (MAXIPIME) 2 g in sodium chloride 0.9 % 100 mL IVPB  2 g Intravenous Q8H Joaquim Nam, RPH 200 mL/hr at 04/18/23 0914 2 g at 04/18/23 4098   cyanocobalamin (VITAMIN B12) injection 1,000 mcg  1,000 mcg Intramuscular Once Dorcas Carrow, MD       dapagliflozin propanediol (FARXIGA) tablet 10 mg  10 mg Oral QAC breakfast Russella Dar, NP   10 mg at 04/18/23 0900   enoxaparin (LOVENOX) injection 40 mg  40 mg Subcutaneous Q24H Russella Dar, NP   40 mg at 04/18/23 0900   famotidine (PEPCID) tablet 40 mg  40 mg Oral Daily Russella Dar, NP   40 mg at 04/18/23 0859   feeding supplement (ENSURE ENLIVE / ENSURE PLUS) liquid 237 mL  237 mL Oral TID BM Dorcas Carrow, MD   237 mL at 04/18/23 0915   fenofibrate tablet 160 mg  160 mg Oral Daily Russella Dar, NP   160 mg at 04/18/23 0900   fluticasone furoate-vilanterol (BREO ELLIPTA) 100-25 MCG/ACT 1 puff  1 puff Inhalation Daily Russella Dar, NP   1 puff at 04/18/23 0901   And   umeclidinium bromide (INCRUSE ELLIPTA)  62.5 MCG/ACT 1 puff  1 puff Inhalation Daily Russella Dar, NP   1 puff at 04/18/23 0901   HYDROcodone-acetaminophen (NORCO/VICODIN) 5-325 MG per tablet 1-2 tablet  1-2 tablet Oral Q4H PRN Russella Dar, NP   1 tablet at 04/18/23 0511   HYDROmorphone (DILAUDID) injection 0.5-1 mg  0.5-1 mg Intravenous Q2H PRN Russella Dar, NP       insulin aspart (novoLOG) injection 0-5 Units  0-5 Units Subcutaneous QHS Russella Dar, NP       insulin aspart (novoLOG) injection 0-9 Units  0-9 Units Subcutaneous TID WC Russella Dar, NP   1 Units at 04/17/23 1740   labetalol (NORMODYNE) tablet 300 mg  300 mg Oral BID Russella Dar, NP   300 mg at 04/18/23 0859   levothyroxine (SYNTHROID) tablet 100 mcg  100 mcg Oral Q0600 Russella Dar, NP   100 mcg at 04/18/23 0458   mirabegron ER (MYRBETRIQ) tablet 25 mg  25 mg Oral Daily Russella Dar,  NP   25 mg at 04/18/23 0900   multivitamin with minerals tablet 1 tablet  1 tablet Oral Daily Dorcas Carrow, MD   1 tablet at 04/18/23 0859   ondansetron (ZOFRAN) tablet 4 mg  4 mg Oral Q6H PRN Russella Dar, NP       Or   ondansetron [facility]) injection 4 mg  4 mg Intravenous Q6H PRN Russella Dar, NP       pantoprazole (PROTONIX) EC tablet 40 mg  40 mg Oral BID AC Russella Dar, NP   40 mg at 04/18/23 0859   sacubitril-valsartan (ENTRESTO) 97-103 mg per tablet  1 tablet Oral BID Russella Dar, NP   1 tablet at 04/18/23 0900   sodium chloride flush (NS) 0.9 % injection 3 mL  3 mL Intravenous Q12H Russella Dar, NP   3 mL at 04/18/23 0915   vancomycin (VANCOREADY) IVPB 1500 mg/300 mL  1,500 mg Intravenous Q24H Stevphen Rochester, RPH   Stopped at 04/17/23 2202   Vitamin D (Ergocalciferol) (DRISDOL) 1.25 MG (50000 UNIT) capsule 50,000 Units  50,000 Units Oral Q7 days Dorcas Carrow, MD   50,000 Units at 04/17/23 1744     Discharge Medications: Please see discharge summary for a list of discharge medications.  Relevant Imaging  Results:  Relevant Lab Results:   Additional Information [id number]  Raygen Linquist A [place], [place]

## 2023-04-18 NOTE — Progress Notes (Signed)
Occupational Therapy Treatment Patient Details Name: [name] MRN: [id number] DOB: 1946/03/25 Today's Date: 04/18/2023   History of present illness Patient is a 77 y/o male who presents S/P left hand cellulitis as well as recent onset of deconditioning/debility which started ~2 weeks ago. PMH significant for frequent falls, CAD, cardiomyopathy, osteomyelitis of toe L foot, R shoulder pain, DM, R knee OA, HTN, hypercalcemia, hyperthyroidism, hypoxia, lacunar infarct, rotator cuff arthropathy, ACDF 2014, back surgery 1986, L knee replacement, R TKA 2013.   OT comments  Pt in good spirits, feeling better today. Pt mod A for sit to stand from standard bed height, min guard to min A for sit to stand from elevated surface. Pt able to ambulate to toilet and perform toileting min guard. Pt performed 5X sit to stand in 75 seconds with elevated bed, bed rail for assistance. Pt able to complete dressing at EOB, some fatigue at end of session. Pt left sitting in chair with family present, will benefit from continued skilled OT, DC plan still appropriate   Recommendations for follow up therapy are one component of a multi-disciplinary discharge planning process, led by the attending physician.  Recommendations may be updated based on patient status, additional functional criteria and insurance authorization.    Assistance Recommended at Discharge Intermittent Supervision/Assistance  Patient can return home with the following  A little help with walking and/or transfers;A little help with bathing/dressing/bathroom;Help with stairs or ramp for entrance;Assist for transportation;Assistance with cooking/housework   Equipment Recommendations  None recommended by OT    Recommendations for Other Services      Precautions / Restrictions Precautions Precautions: Fall Restrictions Weight Bearing Restrictions: No       Mobility Bed Mobility Overal bed mobility: Modified Independent              General bed mobility comments: incrased time supine to sit    Transfers Overall transfer level: Needs assistance Equipment used: Rolling walker (2 wheels) Transfers: Sit to/from Stand, Bed to chair/wheelchair/BSC Sit to Stand: Mod assist     Step pivot transfers: Min guard     General transfer comment: assist for sit to stand, mod A today from standard height, min guard to min A from elevated surface.     Balance   Sitting-balance support: Feet supported Sitting balance-Leahy Scale: Good Sitting balance - Comments: sitting EOB   Standing balance support: Bilateral upper extremity supported, During functional activity, Reliant on assistive device for balance Standing balance-Leahy Scale: Fair                             ADL either performed or assessed with clinical judgement   ADL Overall ADL's : Needs assistance/impaired Eating/Feeding: Set up;Sitting   Grooming: Min guard;Standing;Wash/dry hands           Upper Body Dressing : Minimal assistance;Sitting   Lower Body Dressing: Moderate assistance;Sitting/lateral leans;Sit to/from stand   Toilet Transfer: Minimal assistance;Stand-pivot;Cueing for safety;Rolling walker (2 wheels);Regular Toilet   Toileting- Architect and Hygiene: Min guard;Sit to/from stand       Functional mobility during ADLs: Min guard      Extremity/Trunk Assessment Upper Extremity Assessment Upper Extremity Assessment: RUE deficits/detail RUE Deficits / Details: Limited functional A/ROM of right shoulder due to chronic issues. Daughter reports that he fell on his right hand/wrist ~6 weeks ago and pt reports no deficits. LUE Deficits / Details: Cellulitis present in left hand/wrist with visable redness and warm  to the touch. Unable to demonstrate a full functional grasp. no active wrist ROM demonstrated due to pain and presence of cellulitis. Elbow and shoulder WFL.   Lower Extremity Assessment Lower Extremity  Assessment: Defer to PT evaluation        Vision       Perception     Praxis      Cognition Arousal/Alertness: Awake/alert Behavior During Therapy: WFL for tasks assessed/performed Overall Cognitive Status: Within Functional Limits for tasks assessed                                          Exercises Exercises: Other exercises Other Exercises Other Exercises: sit to stand X5 modified, elevated surface, bed rail for sit to stand, 75 seconds    Shoulder Instructions       General Comments      Pertinent Vitals/ Pain       Pain Assessment Pain Assessment: No/denies pain Faces Pain Scale: No hurt  Home Living Family/patient expects to be discharged to:: Private residence Living Arrangements: Spouse/significant other;Children                                      Prior Functioning/Environment              Frequency  Min 2X/week        Progress Toward Goals  OT Goals(current goals can now be found in the care plan section)  Progress towards OT goals: Progressing toward goals  Acute Rehab OT Goals Patient Stated Goal: to improve strength to return home to assist wife as needed OT Goal Formulation: With patient Time For Goal Achievement: 04/30/23 Potential to Achieve Goals: Good ADL Goals Pt Will Perform Grooming: with modified independence;sitting Pt Will Perform Upper Body Bathing: with modified independence;sitting Pt Will Perform Lower Body Bathing: with set-up;sit to/from stand;sitting/lateral leans Pt Will Transfer to Toilet: with supervision;ambulating;bedside commode;regular height toilet Pt Will Perform Toileting - Clothing Manipulation and hygiene: with supervision;sitting/lateral leans;sit to/from stand Additional ADL Goal #1: Pt will demonstrate improved dynamic standing balance while participating in BADL tasks requiring only Supervision for safety.  Plan Discharge plan remains appropriate     Co-evaluation                 AM-PAC OT "6 Clicks" Daily Activity     Outcome Measure   Help from another person eating meals?: None Help from another person taking care of personal grooming?: A Little Help from another person toileting, which includes using toliet, bedpan, or urinal?: A Little Help from another person bathing (including washing, rinsing, drying)?: A Lot Help from another person to put on and taking off regular upper body clothing?: A Little Help from another person to put on and taking off regular lower body clothing?: A Lot 6 Click Score: 17    End of Session Equipment Utilized During Treatment: Gait belt;Rolling walker (2 wheels)  OT Visit Diagnosis: Muscle weakness (generalized) (M62.81);Unsteadiness on feet (R26.81);History of falling (Z91.81)   Activity Tolerance Patient tolerated treatment well   Patient Left in chair;with call bell/phone within reach;with family/visitor present   Nurse Communication Mobility status        Time: 8119-1478 OT Time Calculation (min): 35 min  Charges: OT General Charges $OT Visit: 1 Visit OT Treatments $Self Care/Home Management :  8-22 mins $Therapeutic Activity: 8-22 mins  [place], OTR/L   Alexis Goodell 04/18/2023, 12:56 PM

## 2023-04-18 NOTE — Progress Notes (Signed)
PROGRESS NOTE    Steven SIEGENTHALER  ZOX:[id number] DOB: September 22, 1946 DOA: 04/15/2023 PCP: Eunice Blase, PA-C    Brief Narrative:  77 year old with history of BPH, hyperlipidemia, GERD, type 2 diabetes, coronary artery disease and COPD.  Recently admitted with hematemesis in the context of esophagitis, status post EGD and discharged home on PPI presented back to the hospital with extreme weakness, left-sided jaw pain and left wrist pain.  In the emergency room afebrile and hemodynamically stable.  Found to have significant swelling of the right hand.  Soft tissue CT scan of the neck with no abnormality on the left side but reported hypodensity within the right tongue, clinically not present.  Admitted with deconditioning, left hand cellulitis and tenosynovitis.   Assessment & Plan:   Tenosynovitis of the left hand: Blood cultures pending.  Patient remains on broad-spectrum antibiotics with vancomycin and Rocephin. MRI consistent with tenosynovitis, likely inflammatory. Orthopedics following.  Will follow-up recommendations.  Continue mobility with PT OT.  Left ear pain/jaw pain: Possible TM joint pain.  No obvious abnormality on CT scan.  Symptomatic management.  Improved today.  Abnormal CT scan findings: Without any clinical evidence of tumor/tenderness on the right side of the tongue or buccal mucosa.  Will send referral to ENT on discharge.  GERD, acute esophagitis and recent hematemesis: Hemoglobin is stable.  On PPI and Pepcid.  Was asked to stay on soft diet for 2 weeks, he will advance diet today.  Deconditioning and debility: Significant debility.  Work with PT OT.  Refer to rehab. B12 deficiency, his B12 level is significantly low at 139.  Will replace with intramuscular injection while he is in the hospital.  On discharge, will prescribe weekly injections x 4 and monthly injection.  Likely with history of significant upper GI issues she has poor absorption.  Chronic medical issues  including Chronic systolic congestive heart failure, known ejection fraction 35 to 40%.  Resume his Aldactone and labetalol as well as Entresto. Type 2 diabetes: On sliding scale insulin. Hyperlipidemia, on fibrate simvastatin. Hypothyroidism: On Synthroid. COPD, stable on therapy.  Continue to mobilize.  Discharge to SNF when seen by orthopedics and cleared.  DVT prophylaxis: enoxaparin (LOVENOX) injection 40 mg Start: 04/16/23 1000   Code Status: Full code Family Communication: Daughter at bedside. Disposition Plan: Status is: Inpatient Remains inpatient appropriate because: Significant hand infection     Consultants:  Orthopedics  Procedures:  None  Antimicrobials:  Vancomycin Rocephin 5/21---   Subjective:  Patient seen and examined.  No overnight events.  Feels much better today.  Redness has improved but he still has some swelling of the left hand.  He was able to grasp and use his left hand.  Objective: Vitals:   04/17/23 1745 04/17/23 1940 04/18/23 0318 04/18/23 0737  BP: (!) 152/79 (!) 161/78 136/68 (!) 159/79  Pulse: 73 67 66 66  Resp:  19 20 16   Temp: 97.9 F (36.6 C) 97.9 F (36.6 C) 98 F (36.7 C) 97.6 F (36.4 C)  TempSrc: Oral Oral Oral Oral  SpO2: 92% 93% 97% 96%  Weight:      Height:        Intake/Output Summary (Last 24 hours) at 04/18/2023 1302 Last data filed at 04/18/2023 0915 Gross per 24 hour  Intake 1563.77 ml  Output --  Net 1563.77 ml   Filed Weights   04/16/23 0700  Weight: 74.2 kg    Examination:  General exam: Appears calm and comfortable  Chronically sick looking.  Not in any distress.  On room air.  Pleasant and interactive today. Respiratory system: Clear to auscultation. Respiratory effort normal. Cardiovascular system: S1 & S2 heard, RRR.  Gastrointestinal system: Abdomen is nondistended, soft and nontender. No organomegaly or masses felt. Normal bowel sounds heard. Central nervous system: Alert and oriented. No  focal neurological deficits.  Grossly weak. Extremities: Symmetric 5 x 5 power. Skin:  Patient mild edema dorsum of the hand, no palpable abscess or localized tenderness.  Has difficulty making fist on the left side due to pain.  Distal neurovascular status intact.  Oral exam: Patient has normal looking tongue on inspection and palpation.  There is no palpable tumor or mass.  Buccal mucosa without palpable tumor or abnormality.    Data Reviewed: I have personally reviewed following labs and imaging studies  CBC: Recent Labs  Lab 04/15/23 1624 04/16/23 0853 04/17/23 0331  WBC 15.5* 14.3* 11.9*  NEUTROABS 11.4* 11.0*  --   HGB 10.9* 10.0* 9.3*  HCT 34.4* 30.6* 28.1*  MCV 93.2 92.2 89.5  PLT 419* 437* 456*   Basic Metabolic Panel: Recent Labs  Lab 04/12/23 0814 04/15/23 1624 04/16/23 0853 04/17/23 0331  NA 138 139 138 137  K 3.3* 3.9 3.7 3.6  CL 105 105 107 107  CO2 24 24 19* 18*  GLUCOSE 107* 134* 99 111*  BUN 10 10 13 11   CREATININE 0.88 1.01 1.04 0.89  CALCIUM 8.3* 8.6* 8.3* 8.3*   GFR: Estimated Creatinine Clearance: 70.6 mL/min (by C-G formula based on SCr of 0.89 mg/dL). Liver Function Tests: Recent Labs  Lab 04/12/23 0814 04/15/23 1624  AST 28 18  ALT 23 16  ALKPHOS 43 53  BILITOT 0.6 0.8  PROT 5.8* 6.3*  ALBUMIN 2.5* 2.4*   No results for input(s): "LIPASE", "AMYLASE" in the last 168 hours. No results for input(s): "AMMONIA" in the last 168 hours. Coagulation Profile: Recent Labs  Lab 04/16/23 0055  INR 1.3*   Cardiac Enzymes: No results for input(s): "CKTOTAL", "CKMB", "CKMBINDEX", "TROPONINI" in the last 168 hours. BNP (last 3 results) No results for input(s): "PROBNP" in the last 8760 hours. HbA1C: Recent Labs    04/16/23 0853  HGBA1C 6.1*   CBG: Recent Labs  Lab 04/17/23 0809 04/17/23 1247 04/17/23 1740 04/17/23 1944 04/18/23 0740  GLUCAP 107* 147* 123* 118* 112*   Lipid Profile: No results for input(s): "CHOL", "HDL",  "LDLCALC", "TRIG", "CHOLHDL", "LDLDIRECT" in the last 72 hours. Thyroid Function Tests: Recent Labs    04/17/23 0331  TSH 6.842*   Anemia Panel: Recent Labs    04/17/23 0331  VITAMINB12 139*   Sepsis Labs: Recent Labs  Lab 04/12/23 0814 04/16/23 0055 04/16/23 0350  PROCALCITON <0.10  --   --   LATICACIDVEN  --  1.1 1.2    Recent Results (from the past 240 hour(s))  Surgical pcr screen     Status: None   Collection Time: 04/10/23 10:30 PM   Specimen: Nasal Mucosa; Nasal Swab  Result Value Ref Range Status   MRSA, PCR NEGATIVE NEGATIVE Final   Staphylococcus aureus NEGATIVE NEGATIVE Final    Comment: (NOTE) The Xpert SA Assay (FDA approved for NASAL specimens in patients 84 years of age and older), is one component of a comprehensive surveillance program. It is not intended to diagnose infection nor to guide or monitor treatment. Performed at [facility], [address] [place], [place] [address]   Blood Culture (routine x 2)     Status:  None (Preliminary result)   Collection Time: 04/15/23 11:41 PM   Specimen: BLOOD  Result Value Ref Range Status   Specimen Description BLOOD SITE NOT SPECIFIED  Final   Special Requests   Final    BOTTLES DRAWN AEROBIC AND ANAEROBIC Blood Culture adequate volume   Culture   Final    NO GROWTH 2 DAYS Performed at [facility], [address] [place], [place] [address]    Report Status PENDING  Incomplete  Resp panel by RT-PCR (RSV, Flu A&B, Covid) Anterior Nasal Swab     Status: None   Collection Time: 04/16/23  1:15 AM   Specimen: Anterior Nasal Swab  Result Value Ref Range Status   SARS Coronavirus 2 by RT PCR NEGATIVE NEGATIVE Final   Influenza A by PCR NEGATIVE NEGATIVE Final   Influenza B by PCR NEGATIVE NEGATIVE Final    Comment: (NOTE) The Xpert Xpress SARS-CoV-2/FLU/RSV plus assay is intended as an aid in the diagnosis of influenza from Nasopharyngeal swab specimens and should not be used as a sole basis  for treatment. Nasal washings and aspirates are unacceptable for Xpert Xpress SARS-CoV-2/FLU/RSV testing.  Fact Sheet for Patients: BloggerCourse.com  Fact Sheet for Healthcare Providers: SeriousBroker.it  This test is not yet approved or cleared by the [place] FDA and has been authorized for detection and/or diagnosis of SARS-CoV-2 by FDA under an Emergency Use Authorization (EUA). This EUA will remain in effect (meaning this test can be used) for the duration of the COVID-19 declaration under Section 564(b)(1) of the Act, 21 U.S.C. section 360bbb-3(b)(1), unless the authorization is terminated or revoked.     Resp Syncytial Virus by PCR NEGATIVE NEGATIVE Final    Comment: (NOTE) Fact Sheet for Patients: BloggerCourse.com  Fact Sheet for Healthcare Providers: SeriousBroker.it  This test is not yet approved or cleared by the [place] FDA and has been authorized for detection and/or diagnosis of SARS-CoV-2 by FDA under an Emergency Use Authorization (EUA). This EUA will remain in effect (meaning this test can be used) for the duration of the COVID-19 declaration under Section 564(b)(1) of the Act, 21 U.S.C. section 360bbb-3(b)(1), unless the authorization is terminated or revoked.  Performed at [facility], [address] [place], [place] [address]   Blood Culture (routine x 2)     Status: None (Preliminary result)   Collection Time: 04/16/23  1:15 AM   Specimen: BLOOD RIGHT FOREARM  Result Value Ref Range Status   Specimen Description BLOOD RIGHT FOREARM  Final   Special Requests   Final    BOTTLES DRAWN AEROBIC AND ANAEROBIC Blood Culture adequate volume   Culture   Final    NO GROWTH 2 DAYS Performed at [facility], [address], [place], [place] [address]    Report Status PENDING  Incomplete         Radiology Studies: MR HAND LEFT W  WO CONTRAST  Result Date: 04/16/2023 CLINICAL DATA:  Hand pain, chronic, inflammatory arthritis suspected. EXAM: MRI OF THE LEFT HAND WITHOUT AND WITH CONTRAST TECHNIQUE: Multiplanar, multisequence MR imaging of the left hand was performed before and after the administration of intravenous contrast. CONTRAST:  7mL GADAVIST GADOBUTROL 1 MMOL/ML IV SOLN COMPARISON:  Left hand radiographs dated Apr 15, 2023 FINDINGS: Bones/Joint/Cartilage No marrow signal abnormality. No fracture or dislocation. Normal alignment. No joint effusion. No erosive changes. No periostitis. Degenerative changes with articular cartilage loss and subchondral cystic changes at the first carpometacarpal joint. There  also cystic changes of the scaphoid, capitate and triquetrum. There also cystic changes about the ulnar styloid process Ligaments Collateral ligaments are intact. There is edema and irregularity of the dorsal scapholunate ligament concerning for ligamentous tear. Muscles and Tendons There is generalized edema about the flexor and extensor tendons suggesting tenosynovitis. There is edema about the extensor carpi ulnaris tendon suggesting tenosynovitis. Mild generalized edema of the hypothenar in thenar muscles. Soft tissue Marked subcutaneous soft tissue edema without evidence of fluid collection or abscess. No soft tissue mass. IMPRESSION: 1. Generalized edema about the flexor and extensor tendons suggesting tenosynovitis. There is also generalized subcutaneous soft tissue edema of the hand and digits. The findings are suspicious for inflammatory arthropathy. 2. Cystic changes of the scaphoid, capitate, triquetrum and ulnar styloid process concerning for inflammatory arthropathy. 3. Edema and irregularity of the dorsal scapholunate ligament concerning for ligamentous tear. 4. Mild generalized edema of the hypothenar and thenar muscles suggesting myositis. Electronically Signed   By: Larose Hires D.O.   On: 04/16/2023 23:36         Scheduled Meds:  atorvastatin  80 mg Oral Daily   cyanocobalamin  1,000 mcg Intramuscular Once   dapagliflozin propanediol  10 mg Oral QAC breakfast   enoxaparin (LOVENOX) injection  40 mg Subcutaneous Q24H   famotidine  40 mg Oral Daily   feeding supplement  237 mL Oral TID BM   fenofibrate  160 mg Oral Daily   fluticasone furoate-vilanterol  1 puff Inhalation Daily   And   umeclidinium bromide  1 puff Inhalation Daily   insulin aspart  0-5 Units Subcutaneous QHS   insulin aspart  0-9 Units Subcutaneous TID WC   labetalol  300 mg Oral BID   levothyroxine  100 mcg Oral Q0600   mirabegron ER  25 mg Oral Daily   multivitamin with minerals  1 tablet Oral Daily   pantoprazole  40 mg Oral BID AC   sacubitril-valsartan  1 tablet Oral BID   sodium chloride flush  3 mL Intravenous Q12H   Vitamin D (Ergocalciferol)  50,000 Units Oral Q7 days   Continuous Infusions:  ceFEPime (MAXIPIME) IV 2 g (04/18/23 0914)   vancomycin Stopped (04/17/23 2202)     LOS: 2 days    Time spent: 35 minutes    Dorcas Carrow, MD [facility] Pager [phone]

## 2023-04-18 NOTE — Care Management Important Message (Signed)
Important Message  Patient Details  Name: [name] MRN: [id number] Date of Birth: [birth year]   Medicare Important Message Given:  Yes     Dorena Bodo 04/18/2023, 2:43 PM

## 2023-04-18 NOTE — Consult Note (Signed)
   [facility] Inpatient Consult   04/18/2023  Bob Lagrave [facility] 07-06-1946 [id number]   [facility] [[facility]]  Accountable Care Organization [ACO] Patient: [employer]  *[facility] RN Hospital Liaison remote coverage review for patient admitted to [facility]   Primary Care Provider:  Eunice Blase, PA-C at [facility]   Patient screened for hospitalization with noted high risk score for unplanned readmission risk  and to assess for potential [facility]  [[facility]] Care Management service needs for readmission prevention for post hospital transition for care coordination.  Review of patient's electronic medical record reveals patient is being considered for a skilled nursing facility transition for rehab needs. Patient will need insurance authorization for SNF - rehab.   Plan:  Continue to follow progress and disposition to assess for post hospital community care coordination/management needs.  Referral request for community care coordination: Pending disposition and needs following. If patient does transition to a skilled nursing facility level of care then post hospital transition is to be met at that level of care for rehab.  Of note, [facility] Care Management/Population Health does not replace or interfere with any arrangements made by the Inpatient Transition of Care team.  For questions contact:   Charlesetta Shanks, RN BSN CCM [facility]  [phone] business mobile phone Toll free office [phone]  *Concierge Line  [phone] Fax number: [phone] [email or web address]

## 2023-04-19 DIAGNOSIS — L03114 Cellulitis of left upper limb: Secondary | ICD-10-CM | POA: Diagnosis not present

## 2023-04-19 LAB — GLUCOSE, CAPILLARY
Glucose-Capillary: 103 mg/dL — ABNORMAL HIGH (ref 70–99)
Glucose-Capillary: 125 mg/dL — ABNORMAL HIGH (ref 70–99)
Glucose-Capillary: 97 mg/dL (ref 70–99)
Glucose-Capillary: 115 mg/dL — ABNORMAL HIGH (ref 70–99)

## 2023-04-19 LAB — CULTURE, BLOOD (ROUTINE X 2): Special Requests: ADEQUATE

## 2023-04-19 MED ORDER — CYANOCOBALAMIN 1000 MCG/ML IJ SOLN
1000.0000 ug | Freq: Once | INTRAMUSCULAR | Status: AC
  Administered 2023-04-19: 1000 ug via INTRAMUSCULAR
  Filled 2023-04-19: qty 1

## 2023-04-19 NOTE — Progress Notes (Addendum)
Physical Therapy Treatment Patient Details Name: [name] MRN: [id number] DOB: Feb 17, 1946 Today's Date: 04/19/2023   History of Present Illness Patient is a 77 y/o male who presents S/P left hand cellulitis as well as recent onset of deconditioning/debility which started ~2 weeks ago. PMH significant for frequent falls, CAD, cardiomyopathy, osteomyelitis of toe L foot, R shoulder pain, DM, R knee OA, HTN, hypercalcemia, hyperthyroidism, hypoxia, lacunar infarct, rotator cuff arthropathy, ACDF 2014, back surgery 1986, L knee replacement, R TKA 2013.    PT Comments    Pt tolerated today's session well, able to progress ambulation distance and sit<>stand trials but continues to require minG-minA for mobility with RW. Pt performed 5 sit<>stand from low bed height, requiring minA with each rep but noted improved control of descent with repetition. Pt ambulated in the hallway today progressing his distance but required a significant seated rest break before being able to participate in sit<>stand trials due to fatigue. Discharge recommendations remain appropriate as pt continues to require physical assist with all OOB mobility, acute PT will continue to follow during admission.     Recommendations for follow up therapy are one component of a multi-disciplinary discharge planning process, led by the attending physician.  Recommendations may be updated based on patient status, additional functional criteria and insurance authorization.  Follow Up Recommendations  Can patient physically be transported by private vehicle: Yes    Assistance Recommended at Discharge PRN  Patient can return home with the following A lot of help with bathing/dressing/bathroom;Assistance with cooking/housework;Assist for transportation;Help with stairs or ramp for entrance;A little help with walking and/or transfers   Equipment Recommendations  None recommended by PT    Recommendations for Other Services        Precautions / Restrictions Precautions Precautions: Fall Restrictions Weight Bearing Restrictions: No     Mobility  Bed Mobility Overal bed mobility: Needs Assistance Bed Mobility: Supine to Sit, Sit to Supine     Supine to sit: HOB elevated, Supervision Sit to supine: Supervision, HOB elevated   General bed mobility comments: increased time and use of bed rails, which pt reports he has at home    Transfers Overall transfer level: Needs assistance Equipment used: Rolling walker (2 wheels) Transfers: Sit to/from Stand Sit to Stand: Min assist           General transfer comment: minA from standard height bed today, with cues for scooting to EOB prior to standing. Pt with good hand placement and anterior lean prior to stand    Ambulation/Gait Ambulation/Gait assistance: Min guard Gait Distance (Feet): 100 Feet Assistive device: Rolling walker (2 wheels) Gait Pattern/deviations: Decreased stride length, Step-through pattern, Trunk flexed, Narrow base of support Gait velocity: Decreased     General Gait Details: increased trunk flexion and downward gaze, able to correct with cueing but reverting back with fatigue   Stairs             Wheelchair Mobility    Modified Rankin (Stroke Patients Only)       Balance Overall balance assessment: Needs assistance Sitting-balance support: Feet supported, No upper extremity supported Sitting balance-Leahy Scale: Good Sitting balance - Comments: sitting EOB   Standing balance support: Bilateral upper extremity supported, During functional activity, Reliant on assistive device for balance Standing balance-Leahy Scale: Fair                              Cognition Arousal/Alertness: Awake/alert Behavior During Therapy:  WFL for tasks assessed/performed Overall Cognitive Status: Within Functional Limits for tasks assessed                                 General Comments: pleasant throughout  session        Exercises Other Exercises Other Exercises: sit to stand x5 reps from standard height, minA required for power-up    General Comments General comments (skin integrity, edema, etc.): wife present at the beginning of the session      Pertinent Vitals/Pain Pain Assessment Pain Assessment: No/denies pain    Home Living                          Prior Function            PT Goals (current goals can now be found in the care plan section) Acute Rehab PT Goals Patient Stated Goal: Get stronger, go to rehab before home PT Goal Formulation: With patient Time For Goal Achievement: 04/24/23 Potential to Achieve Goals: Good Progress towards PT goals: Progressing toward goals    Frequency    Min 2X/week      PT Plan Current plan remains appropriate    Co-evaluation              AM-PAC PT "6 Clicks" Mobility   Outcome Measure  Help needed turning from your back to your side while in a flat bed without using bedrails?: A Little Help needed moving from lying on your back to sitting on the side of a flat bed without using bedrails?: A Little Help needed moving to and from a bed to a chair (including a wheelchair)?: A Little Help needed standing up from a chair using your arms (e.g., wheelchair or bedside chair)?: A Little Help needed to walk in hospital room?: A Little Help needed climbing 3-5 steps with a railing? : Total 6 Click Score: 16    End of Session Equipment Utilized During Treatment: Gait belt Activity Tolerance: Patient tolerated treatment well Patient left: with call bell/phone within reach;in bed Nurse Communication: Mobility status PT Visit Diagnosis: Unsteadiness on feet (R26.81);Pain;Repeated falls (R29.6) Pain - Right/Left: Left Pain - part of body: Hand     Time: 4098-1191 PT Time Calculation (min) (ACUTE ONLY): 28 min  Charges:  $Gait Training: 8-22 mins $Therapeutic Activity: 8-22 mins                     Lindalou Hose,  PT DPT Acute Rehabilitation Services Office [phone]    Leonie Man 04/19/2023, 3:59 PM

## 2023-04-19 NOTE — Progress Notes (Signed)
PROGRESS NOTE    [name]  ZOX:[id number] DOB: 1946/02/19 DOA: 04/15/2023 PCP: Eunice Blase, PA-C    Brief Narrative:  77 year old with history of BPH, hyperlipidemia, GERD, type 2 diabetes, coronary artery disease and COPD.  Recently admitted with hematemesis in the context of esophagitis, status post EGD and discharged home on PPI presented back to the hospital with extreme weakness, left-sided jaw pain and left wrist pain.  In the emergency room afebrile and hemodynamically stable.  Found to have significant swelling of the right hand.  Soft tissue CT scan of the neck with no abnormality on the left side but reported hypodensity within the right tongue, clinically not present.  Admitted with deconditioning, left hand cellulitis and tenosynovitis.   Assessment & Plan:   Tenosynovitis of the left hand: Blood cultures negative.   Treated with broad-spectrum antibiotics vancomycin Rocephin.   MRI consistent with tenosynovitis, likely inflammatory. Seen by orthopedics.  Not recommend any surgical intervention. Clinically improved and normalized today.  Left ear pain/jaw pain: Possible TM joint pain.  No obvious abnormality on CT scan.  Symptomatic management.  Improved today.  Abnormal CT scan findings: Without any clinical evidence of tumor/tenderness on the right side of the tongue or buccal mucosa.  Will send referral to ENT on discharge.  GERD, acute esophagitis and recent hematemesis: Hemoglobin is stable.  On PPI and Pepcid.  Regular diet now.  Deconditioning and debility: Significant debility.  Work with PT OT.  Refer to rehab. B12 deficiency, his B12 level is significantly low at 139.  Will replace with intramuscular injection while he is in the hospital.  On discharge, will prescribe weekly injections x 4 and monthly injection.  Likely with history of significant upper GI issues he has poor absorption.  Chronic medical issues including Chronic systolic congestive heart  failure, known ejection fraction 35 to 40%.  Resumed his Aldactone and labetalol as well as Entresto. Type 2 diabetes: On sliding scale insulin. Hyperlipidemia, on fibrate simvastatin. Hypothyroidism: On Synthroid. COPD, stable on therapy.  Continue to mobilize.  Discharge to SNF when bed available.  DVT prophylaxis: enoxaparin (LOVENOX) injection 40 mg Start: 04/16/23 1000   Code Status: Full code Family Communication: Wife at the bedside. Disposition Plan: Status is: Inpatient Remains inpatient appropriate because: Medically stable.  Needs a SNF.     Consultants:  Orthopedics  Procedures:  None  Antimicrobials:  Vancomycin Rocephin 5/21--- 5/24   Subjective:  Patient seen and examined.  No overnight events.  Wife at the bedside.  Left hand is much improved and less painful now.  Denies any jaw pain today.  Objective: Vitals:   04/18/23 1704 04/18/23 2021 04/19/23 0622 04/19/23 0735  BP: (!) 140/67 (!) 160/82 (!) 149/78 (!) 155/79  Pulse: 60 72 70 65  Resp: 16 17 18 20   Temp: 97.6 F (36.4 C) 98.8 F (37.1 C) 97.7 F (36.5 C) 97.8 F (36.6 C)  TempSrc: Oral Oral Oral Oral  SpO2: 97% 98% 98% 98%  Weight:      Height:        Intake/Output Summary (Last 24 hours) at 04/19/2023 1326 Last data filed at 04/19/2023 0700 Gross per 24 hour  Intake 840.11 ml  Output --  Net 840.11 ml   Filed Weights   04/16/23 0700  Weight: 74.2 kg    Examination:  General: Looks comfortable.  Eating breakfast. Cardiovascular: S1-S2 normal.  Regular rate rhythm. Respiratory: Bilateral clear.  No added sounds. Gastrointestinal: Soft.  Nontender.  Bowel sound  present. Ext: No swelling or edema. Left hand with no evidence of erythema or swelling today.  Previous edema and swelling almost improved.     Data Reviewed: I have personally reviewed following labs and imaging studies  CBC: Recent Labs  Lab 04/15/23 1624 04/16/23 0853 04/17/23 0331  WBC 15.5* 14.3* 11.9*   NEUTROABS 11.4* 11.0*  --   HGB 10.9* 10.0* 9.3*  HCT 34.4* 30.6* 28.1*  MCV 93.2 92.2 89.5  PLT 419* 437* 456*   Basic Metabolic Panel: Recent Labs  Lab 04/15/23 1624 04/16/23 0853 04/17/23 0331  NA 139 138 137  K 3.9 3.7 3.6  CL 105 107 107  CO2 24 19* 18*  GLUCOSE 134* 99 111*  BUN 10 13 11   CREATININE 1.01 1.04 0.89  CALCIUM 8.6* 8.3* 8.3*   GFR: Estimated Creatinine Clearance: 70.6 mL/min (by C-G formula based on SCr of 0.89 mg/dL). Liver Function Tests: Recent Labs  Lab 04/15/23 1624  AST 18  ALT 16  ALKPHOS 53  BILITOT 0.8  PROT 6.3*  ALBUMIN 2.4*   No results for input(s): "LIPASE", "AMYLASE" in the last 168 hours. No results for input(s): "AMMONIA" in the last 168 hours. Coagulation Profile: Recent Labs  Lab 04/16/23 0055  INR 1.3*   Cardiac Enzymes: No results for input(s): "CKTOTAL", "CKMB", "CKMBINDEX", "TROPONINI" in the last 168 hours. BNP (last 3 results) No results for input(s): "PROBNP" in the last 8760 hours. HbA1C: No results for input(s): "HGBA1C" in the last 72 hours.  CBG: Recent Labs  Lab 04/18/23 1311 04/18/23 1705 04/18/23 2152 04/19/23 0728 04/19/23 1132  GLUCAP 106* 119* 125* 103* 125*   Lipid Profile: No results for input(s): "CHOL", "HDL", "LDLCALC", "TRIG", "CHOLHDL", "LDLDIRECT" in the last 72 hours. Thyroid Function Tests: Recent Labs    04/17/23 0331  TSH 6.842*   Anemia Panel: Recent Labs    04/17/23 0331  VITAMINB12 139*   Sepsis Labs: Recent Labs  Lab 04/16/23 0055 04/16/23 0350  LATICACIDVEN 1.1 1.2    Recent Results (from the past 240 hour(s))  Surgical pcr screen     Status: None   Collection Time: 04/10/23 10:30 PM   Specimen: Nasal Mucosa; Nasal Swab  Result Value Ref Range Status   MRSA, PCR NEGATIVE NEGATIVE Final   Staphylococcus aureus NEGATIVE NEGATIVE Final    Comment: (NOTE) The Xpert SA Assay (FDA approved for NASAL specimens in patients 24 years of age and older), is one  component of a comprehensive surveillance program. It is not intended to diagnose infection nor to guide or monitor treatment. Performed at [facility], [address] [place], [place] [address]   Blood Culture (routine x 2)     Status: None (Preliminary result)   Collection Time: 04/15/23 11:41 PM   Specimen: BLOOD  Result Value Ref Range Status   Specimen Description BLOOD SITE NOT SPECIFIED  Final   Special Requests   Final    BOTTLES DRAWN AEROBIC AND ANAEROBIC Blood Culture adequate volume   Culture   Final    NO GROWTH 3 DAYS Performed at [facility], [address] [place], [place] [address]    Report Status PENDING  Incomplete  Resp panel by RT-PCR (RSV, Flu A&B, Covid) Anterior Nasal Swab     Status: None   Collection Time: 04/16/23  1:15 AM   Specimen: Anterior Nasal Swab  Result Value Ref Range Status   SARS Coronavirus 2 by RT PCR NEGATIVE NEGATIVE Final  Influenza A by PCR NEGATIVE NEGATIVE Final   Influenza B by PCR NEGATIVE NEGATIVE Final    Comment: (NOTE) The Xpert Xpress SARS-CoV-2/FLU/RSV plus assay is intended as an aid in the diagnosis of influenza from Nasopharyngeal swab specimens and should not be used as a sole basis for treatment. Nasal washings and aspirates are unacceptable for Xpert Xpress SARS-CoV-2/FLU/RSV testing.  Fact Sheet for Patients: BloggerCourse.com  Fact Sheet for Healthcare Providers: SeriousBroker.it  This test is not yet approved or cleared by the [place] FDA and has been authorized for detection and/or diagnosis of SARS-CoV-2 by FDA under an Emergency Use Authorization (EUA). This EUA will remain in effect (meaning this test can be used) for the duration of the COVID-19 declaration under Section 564(b)(1) of the Act, 21 U.S.C. section 360bbb-3(b)(1), unless the authorization is terminated or revoked.     Resp Syncytial Virus by PCR NEGATIVE NEGATIVE Final     Comment: (NOTE) Fact Sheet for Patients: BloggerCourse.com  Fact Sheet for Healthcare Providers: SeriousBroker.it  This test is not yet approved or cleared by the [place] FDA and has been authorized for detection and/or diagnosis of SARS-CoV-2 by FDA under an Emergency Use Authorization (EUA). This EUA will remain in effect (meaning this test can be used) for the duration of the COVID-19 declaration under Section 564(b)(1) of the Act, 21 U.S.C. section 360bbb-3(b)(1), unless the authorization is terminated or revoked.  Performed at [facility], [address] [place], [place] [address]   Blood Culture (routine x 2)     Status: None (Preliminary result)   Collection Time: 04/16/23  1:15 AM   Specimen: BLOOD RIGHT FOREARM  Result Value Ref Range Status   Specimen Description BLOOD RIGHT FOREARM  Final   Special Requests   Final    BOTTLES DRAWN AEROBIC AND ANAEROBIC Blood Culture adequate volume   Culture   Final    NO GROWTH 3 DAYS Performed at [facility], [address] [place], [place] [address]    Report Status PENDING  Incomplete         Radiology Studies: No results found.      Scheduled Meds:  atorvastatin  80 mg Oral Daily   cyanocobalamin  1,000 mcg Intramuscular Once   dapagliflozin propanediol  10 mg Oral QAC breakfast   enoxaparin (LOVENOX) injection  40 mg Subcutaneous Q24H   famotidine  40 mg Oral Daily   feeding supplement  237 mL Oral TID BM   fenofibrate  160 mg Oral Daily   fluticasone furoate-vilanterol  1 puff Inhalation Daily   And   umeclidinium bromide  1 puff Inhalation Daily   insulin aspart  0-5 Units Subcutaneous QHS   insulin aspart  0-9 Units Subcutaneous TID WC   labetalol  300 mg Oral BID   levothyroxine  100 mcg Oral Q0600   mirabegron ER  25 mg Oral Daily   multivitamin with minerals  1 tablet Oral Daily   pantoprazole  40 mg Oral BID AC    sacubitril-valsartan  1 tablet Oral BID   sodium chloride flush  3 mL Intravenous Q12H   Vitamin D (Ergocalciferol)  50,000 Units Oral Q7 days   Continuous Infusions:     LOS: 3 days    Time spent: 35 minutes    Dorcas Carrow, MD [facility] Pager [phone]

## 2023-04-19 NOTE — TOC Progression Note (Addendum)
Transition of Care [facility]) - Progression Note    Patient Details  Name: [name] MRN: [id number] Date of Birth: 1946-10-16  [facility] CM/SW Contact  [name], [place] Phone Number: 04/19/2023, 11:44 AM  Clinical Narrative:     CSW received contact from [place] at [facility]. She informed CSW that [employer] approved ambulance transport. Auth ID: [id number]. Authorization is good for 90 days.   TOC will continue to follow.   CSW contacted pt's daughter, Crystal regarding insurance approval and informed her of pending approval for ambulance transport. She stated that family could assist with provided transportation to facility if not approved.   Patient medically stable. DC pending bed availability, bed available tomorrow. Pending discharge Saturday 5/25.   TOC will continue to follow.   CSW received phone call from staff at [facility].  Insurance was approved for [employer] for initial 7 days. 5 days to complete transfer to facility.  Auth ID: [id number]  Pending approval for Ambulance transport at discharge. Sent to [employer] for review.   TOC will continue to follow.        Expected Discharge Plan and Services                                               Social Determinants of Health (SDOH) Interventions SDOH Screenings   Food Insecurity: No Food Insecurity (04/16/2023)  Housing: Low Risk  (04/16/2023)  Transportation Needs: No Transportation Needs (04/16/2023)  Utilities: Not At Risk (04/16/2023)  Depression (PHQ2-9): Low Risk  (08/31/2019)  Tobacco Use: Medium Risk (04/15/2023)    Readmission Risk Interventions     No data to display

## 2023-04-20 DIAGNOSIS — J301 Allergic rhinitis due to pollen: Secondary | ICD-10-CM | POA: Diagnosis not present

## 2023-04-20 DIAGNOSIS — I10 Essential (primary) hypertension: Secondary | ICD-10-CM | POA: Diagnosis not present

## 2023-04-20 DIAGNOSIS — E785 Hyperlipidemia, unspecified: Secondary | ICD-10-CM | POA: Diagnosis not present

## 2023-04-20 DIAGNOSIS — R2689 Other abnormalities of gait and mobility: Secondary | ICD-10-CM | POA: Diagnosis not present

## 2023-04-20 DIAGNOSIS — I509 Heart failure, unspecified: Secondary | ICD-10-CM | POA: Diagnosis not present

## 2023-04-20 DIAGNOSIS — E46 Unspecified protein-calorie malnutrition: Secondary | ICD-10-CM | POA: Diagnosis not present

## 2023-04-20 DIAGNOSIS — E119 Type 2 diabetes mellitus without complications: Secondary | ICD-10-CM | POA: Diagnosis not present

## 2023-04-20 DIAGNOSIS — K219 Gastro-esophageal reflux disease without esophagitis: Secondary | ICD-10-CM | POA: Diagnosis not present

## 2023-04-20 DIAGNOSIS — I5022 Chronic systolic (congestive) heart failure: Secondary | ICD-10-CM | POA: Diagnosis not present

## 2023-04-20 DIAGNOSIS — G2581 Restless legs syndrome: Secondary | ICD-10-CM | POA: Diagnosis not present

## 2023-04-20 DIAGNOSIS — K2100 Gastro-esophageal reflux disease with esophagitis, without bleeding: Secondary | ICD-10-CM | POA: Diagnosis not present

## 2023-04-20 DIAGNOSIS — E1140 Type 2 diabetes mellitus with diabetic neuropathy, unspecified: Secondary | ICD-10-CM | POA: Diagnosis not present

## 2023-04-20 DIAGNOSIS — J449 Chronic obstructive pulmonary disease, unspecified: Secondary | ICD-10-CM | POA: Diagnosis not present

## 2023-04-20 DIAGNOSIS — R0600 Dyspnea, unspecified: Secondary | ICD-10-CM | POA: Diagnosis not present

## 2023-04-20 DIAGNOSIS — N400 Enlarged prostate without lower urinary tract symptoms: Secondary | ICD-10-CM | POA: Diagnosis not present

## 2023-04-20 DIAGNOSIS — M65842 Other synovitis and tenosynovitis, left hand: Secondary | ICD-10-CM | POA: Diagnosis not present

## 2023-04-20 DIAGNOSIS — L03114 Cellulitis of left upper limb: Secondary | ICD-10-CM | POA: Diagnosis not present

## 2023-04-20 DIAGNOSIS — K2971 Gastritis, unspecified, with bleeding: Secondary | ICD-10-CM | POA: Diagnosis not present

## 2023-04-20 DIAGNOSIS — J4540 Moderate persistent asthma, uncomplicated: Secondary | ICD-10-CM | POA: Diagnosis not present

## 2023-04-20 DIAGNOSIS — R262 Difficulty in walking, not elsewhere classified: Secondary | ICD-10-CM | POA: Diagnosis not present

## 2023-04-20 DIAGNOSIS — R2681 Unsteadiness on feet: Secondary | ICD-10-CM | POA: Diagnosis not present

## 2023-04-20 DIAGNOSIS — R5383 Other fatigue: Secondary | ICD-10-CM | POA: Diagnosis not present

## 2023-04-20 DIAGNOSIS — E1151 Type 2 diabetes mellitus with diabetic peripheral angiopathy without gangrene: Secondary | ICD-10-CM | POA: Diagnosis not present

## 2023-04-20 DIAGNOSIS — M6281 Muscle weakness (generalized): Secondary | ICD-10-CM | POA: Diagnosis not present

## 2023-04-20 DIAGNOSIS — I2510 Atherosclerotic heart disease of native coronary artery without angina pectoris: Secondary | ICD-10-CM | POA: Diagnosis not present

## 2023-04-20 DIAGNOSIS — E039 Hypothyroidism, unspecified: Secondary | ICD-10-CM | POA: Diagnosis not present

## 2023-04-20 DIAGNOSIS — R911 Solitary pulmonary nodule: Secondary | ICD-10-CM | POA: Diagnosis not present

## 2023-04-20 DIAGNOSIS — R1314 Dysphagia, pharyngoesophageal phase: Secondary | ICD-10-CM | POA: Diagnosis not present

## 2023-04-20 LAB — GLUCOSE, CAPILLARY: Glucose-Capillary: 122 mg/dL — ABNORMAL HIGH (ref 70–99)

## 2023-04-20 LAB — CULTURE, BLOOD (ROUTINE X 2): Special Requests: ADEQUATE

## 2023-04-20 MED ORDER — CYANOCOBALAMIN 1000 MCG/ML IJ SOLN: 1000.0000 ug | INTRAMUSCULAR | 0 refills | Status: AC

## 2023-04-20 MED ORDER — VITAMIN D (ERGOCALCIFEROL) 1.25 MG (50000 UNIT) PO CAPS: 50000.0000 [IU] | ORAL_CAPSULE | ORAL | 0 refills | Status: AC

## 2023-04-20 MED ORDER — POLYETHYLENE GLYCOL 3350 17 G PO PACK
17.0000 g | PACK | Freq: Every day | ORAL | Status: DC
  Administered 2023-04-20: 17 g via ORAL
  Filled 2023-04-20: qty 1

## 2023-04-20 MED ORDER — ACETAMINOPHEN 325 MG PO TABS: 650.0000 mg | ORAL_TABLET | Freq: Four times a day (QID) | ORAL | Status: AC | PRN

## 2023-04-20 MED ORDER — POLYETHYLENE GLYCOL 3350 17 G PO PACK: 17.0000 g | PACK | Freq: Every day | ORAL | 0 refills | Status: DC

## 2023-04-20 NOTE — Discharge Summary (Signed)
Physician Discharge Summary  [name] UJW:[id number] DOB: 10/05/1946 DOA: 04/15/2023  PCP: Eunice Blase, PA-C  Admit date: 04/15/2023 Discharge date: 04/20/2023  Admitted From: Home Disposition: Skilled nursing facility  Recommendations for Outpatient Follow-up:  Follow up with PCP in 1-2 weeks after discharge Will send referral to ENT, please reschedule follow-up  Discharge Condition: Stable CODE STATUS: Full code Diet recommendation: Low-salt and low-carb diet, nutritional supplements  Discharge summary: 77 year old with history of BPH, hyperlipidemia, GERD, type 2 diabetes, coronary artery disease and COPD.  Recently admitted with hematemesis in the context of esophagitis, status post EGD and discharged home on PPI presented back to the hospital with extreme weakness, left-sided jaw pain and left wrist pain.  In the emergency room afebrile and hemodynamically stable.  Found to have significant swelling of the right hand.  Soft tissue CT scan of the neck with no abnormality on the left side but reported hypodensity within the right tongue, clinically not present.  Admitted with deconditioning, left hand cellulitis and tenosynovitis.  Seen by orthopedics.     Assessment & plan of care:   Tenosynovitis of the left hand: Blood cultures negative.   Treated with broad-spectrum antibiotics vancomycin Rocephin.   MRI consistent with tenosynovitis, likely inflammatory. Seen by orthopedics.  Not recommend any surgical intervention. Clinically improved.  Off antibiotics.   Left ear pain/jaw pain: Possible TM joint pain.  No obvious abnormality on CT scan.  Symptomatic management.  Still has intermittent pain mostly with mastication.  ENT referral as below.   Abnormal CT scan findings: Without any clinical evidence of tumor/tenderness on the right side of the tongue or buccal mucosa.  Will send referral to ENT on discharge.   GERD, acute esophagitis and recent hematemesis: Hemoglobin is  stable.  On PPI and Pepcid.  Regular diet now.   Deconditioning and debility: Significant debility.  Work with PT OT.  Refer to rehab.  B12 deficiency, his B12 level is significantly low at 139.  Will replace with intramuscular injection while he is in the hospital.  On discharge, will prescribe weekly injections x 4 and monthly injection.  Likely with history of significant upper GI issues he has poor absorption.  Vitamin D deficiency: Replace.   Chronic medical issues including Chronic systolic congestive heart failure, known ejection fraction 35 to 40%.  Resumed his Aldactone and labetalol as well as Entresto.  Type 2 diabetes: On sliding scale insulin.  On metformin at home.  Continue.  Hyperlipidemia, on fibrate simvastatin.  Hypothyroidism: On Synthroid.  COPD, stable on therapy.   Medically stable to transfer to skilled level of care today.  Discharge Diagnoses:  Principal Problem:   Cellulitis of left hand    Discharge Instructions  Discharge Instructions     Ambulatory referral to ENT   Complete by: As directed    Diet - low sodium heart healthy   Complete by: As directed    Diet Carb Modified   Complete by: As directed    Increase activity slowly   Complete by: As directed       Allergies as of 04/20/2023       Reactions   Latex Rash, Other (See Comments)   Skin gets "blood raw and turns into sores" Band Aid will tear off his skin   Codeine Other (See Comments)   Severe Headaches    Zolpidem Tartrate Other (See Comments)   Hallucinations   Hct [hydrochlorothiazide] Other (See Comments)   Hypercalcemia   Metformin And Related Diarrhea,  Other (See Comments)   Cannot tolerate in higher doses- has to drop from 2,000 mg daily to 1,500 mg daily to STOP the diarrhea that was occurring   Naproxen Diarrhea   Prednisone Diarrhea   Adhesive [tape] Rash, Other (See Comments)   Redness, also (thinks he can tolerate paper tape)   Penicillins Rash, Other (See  Comments)   Welts Has patient had a PCN reaction causing immediate rash, facial/tongue/throat swelling, SOB or lightheadedness with hypotension: Yes Has patient had a PCN reaction causing severe rash involving mucus membranes or skin necrosis: No Has patient had a PCN reaction that required hospitalization: No Has patient had a PCN reaction occurring within the last 10 years: No If all of the above answers are "NO", then may proceed with Cephalosporin use.        Medication List     STOP taking these medications    Accu-Chek Guide w/Device Kit   Accu-Chek Softclix Lancets lancets   budesonide-formoterol 160-4.5 MCG/ACT inhaler Commonly known as: SYMBICORT   cefdinir 300 MG capsule Commonly known as: OMNICEF   cetirizine 10 MG tablet Commonly known as: ZYRTEC   cloNIDine 0.1 MG tablet Commonly known as: CATAPRES   fenofibrate 160 MG tablet   glucose blood test strip   LORazepam 1 MG tablet Commonly known as: ATIVAN   solifenacin 10 MG tablet Commonly known as: VESICARE       TAKE these medications    acetaminophen 325 MG tablet Commonly known as: TYLENOL Take 2 tablets (650 mg total) by mouth every 6 (six) hours as needed for moderate pain, fever or headache.   albuterol 108 (90 Base) MCG/ACT inhaler Commonly known as: VENTOLIN HFA Inhale 1-2 puffs into the lungs every 6 (six) hours as needed for wheezing or shortness of breath.   atorvastatin 80 MG tablet Commonly known as: LIPITOR Take 1 tablet (80 mg total) by mouth daily.   cyanocobalamin 1000 MCG/ML injection Commonly known as: VITAMIN B12 Inject 1 mL (1,000 mcg total) into the muscle every 7 (seven) days for 4 doses. Start taking on: April 29, 2023   dapagliflozin propanediol 10 MG Tabs tablet Commonly known as: Farxiga Take 1 tablet (10 mg total) by mouth daily before breakfast.   Entresto 97-103 MG Generic drug: sacubitril-valsartan Take 1 tablet by mouth 2 (two) times daily.   famotidine  40 MG tablet Commonly known as: PEPCID Take 40 mg by mouth daily.   fluticasone 50 MCG/ACT nasal spray Commonly known as: FLONASE Place 1 spray into both nostrils as needed for allergies or rhinitis.   gabapentin 400 MG capsule Commonly known as: NEURONTIN TAKE 1 CAPSULE BY MOUTH 3  TIMES DAILY What changed: when to take this   labetalol 300 MG tablet Commonly known as: NORMODYNE TAKE 1 TABLET BY MOUTH  TWICE DAILY   levothyroxine 100 MCG tablet Commonly known as: SYNTHROID Take 100 mcg by mouth daily before breakfast.   metFORMIN 500 MG 24 hr tablet Commonly known as: GLUCOPHAGE-XR TAKE 4 TABLETS BY MOUTH  DAILY WITH BREAKFAST What changed: See the new instructions.   montelukast 10 MG tablet Commonly known as: SINGULAIR Take 10 mg by mouth daily.   Myrbetriq 25 MG Tb24 tablet Generic drug: mirabegron ER Take 25 mg by mouth daily.   pantoprazole 40 MG tablet Commonly known as: PROTONIX Take 1 tablet (40 mg total) by mouth 2 (two) times daily before a meal.   polyethylene glycol 17 g packet Commonly known as: MIRALAX / GLYCOLAX Take  17 g by mouth daily. Start taking on: Apr 21, 2023   spironolactone 25 MG tablet Commonly known as: ALDACTONE Take 0.5 tablets (12.5 mg total) by mouth daily.   traZODone 50 MG tablet Commonly known as: DESYREL Take 50 mg by mouth at bedtime.   Trelegy Ellipta 100-62.5-25 MCG/ACT Aepb Generic drug: Fluticasone-Umeclidin-Vilant Take 1 puff by mouth daily.   Vitamin D (Ergocalciferol) 1.25 MG (50000 UNIT) Caps capsule Commonly known as: DRISDOL Take 1 capsule (50,000 Units total) by mouth every 7 (seven) days for 4 doses. Start taking on: Apr 24, 2023        Allergies  Allergen Reactions   Latex Rash and Other (See Comments)    Skin gets "blood raw and turns into sores"  Band Aid will tear off his skin   Codeine Other (See Comments)    Severe Headaches    Zolpidem Tartrate Other (See Comments)    Hallucinations   Hct  [Hydrochlorothiazide] Other (See Comments)    Hypercalcemia    Metformin And Related Diarrhea and Other (See Comments)    Cannot tolerate in higher doses- has to drop from 2,000 mg daily to 1,500 mg daily to STOP the diarrhea that was occurring   Naproxen Diarrhea   Prednisone Diarrhea   Adhesive [Tape] Rash and Other (See Comments)    Redness, also (thinks he can tolerate paper tape)   Penicillins Rash and Other (See Comments)    Welts Has patient had a PCN reaction causing immediate rash, facial/tongue/throat swelling, SOB or lightheadedness with hypotension: Yes Has patient had a PCN reaction causing severe rash involving mucus membranes or skin necrosis: No Has patient had a PCN reaction that required hospitalization: No Has patient had a PCN reaction occurring within the last 10 years: No If all of the above answers are "NO", then may proceed with Cephalosporin use.     Consultations: Orthopedics   Procedures/Studies: MR HAND LEFT W WO CONTRAST  Result Date: 04/16/2023 CLINICAL DATA:  Hand pain, chronic, inflammatory arthritis suspected. EXAM: MRI OF THE LEFT HAND WITHOUT AND WITH CONTRAST TECHNIQUE: Multiplanar, multisequence MR imaging of the left hand was performed before and after the administration of intravenous contrast. CONTRAST:  7mL GADAVIST GADOBUTROL 1 MMOL/ML IV SOLN COMPARISON:  Left hand radiographs dated Apr 15, 2023 FINDINGS: Bones/Joint/Cartilage No marrow signal abnormality. No fracture or dislocation. Normal alignment. No joint effusion. No erosive changes. No periostitis. Degenerative changes with articular cartilage loss and subchondral cystic changes at the first carpometacarpal joint. There also cystic changes of the scaphoid, capitate and triquetrum. There also cystic changes about the ulnar styloid process Ligaments Collateral ligaments are intact. There is edema and irregularity of the dorsal scapholunate ligament concerning for ligamentous tear. Muscles and  Tendons There is generalized edema about the flexor and extensor tendons suggesting tenosynovitis. There is edema about the extensor carpi ulnaris tendon suggesting tenosynovitis. Mild generalized edema of the hypothenar in thenar muscles. Soft tissue Marked subcutaneous soft tissue edema without evidence of fluid collection or abscess. No soft tissue mass. IMPRESSION: 1. Generalized edema about the flexor and extensor tendons suggesting tenosynovitis. There is also generalized subcutaneous soft tissue edema of the hand and digits. The findings are suspicious for inflammatory arthropathy. 2. Cystic changes of the scaphoid, capitate, triquetrum and ulnar styloid process concerning for inflammatory arthropathy. 3. Edema and irregularity of the dorsal scapholunate ligament concerning for ligamentous tear. 4. Mild generalized edema of the hypothenar and thenar muscles suggesting myositis. Electronically Signed   By: Leona Carry  Ahmed D.O.   On: 04/16/2023 23:36   CT C-SPINE NO CHARGE  Result Date: 04/16/2023 CLINICAL DATA:  Neck pain, prior cervical surgery. EXAM: CT Cervical Spine with contrast TECHNIQUE: Multiplanar CT images of the cervical spine were reconstructed from contemporary CT of the Neck. RADIATION DOSE REDUCTION: This exam was performed according to the departmental dose-optimization program which includes automated exposure control, adjustment of the mA and/or kV according to patient size and/or use of iterative reconstruction technique. CONTRAST:  None or No additional COMPARISON:  09/18/2018. FINDINGS: Alignment: There is mild anterolisthesis at C4-C5. Skull base and vertebrae: No acute fracture. Anterior cervical spinal fusion hardware is present at C3-C4 without evidence of hardware loosening. Soft tissues and spinal canal: No prevertebral fluid or swelling. No visible canal hematoma. Disc levels: Multilevel intervertebral disc space narrowing, degenerative endplate changes, and facet arthropathy.  Upper chest: Emphysematous changes and scarring are noted at the lung apices, unchanged from the prior exam. Aortic atherosclerosis. Other: Please see CT neck with contrast for additional information. IMPRESSION: 1. Multilevel degenerative changes without evidence of acute fracture. 2. Anterior cervical spinal fusion hardware at C3-C4 without evidence of hardware loosening. Electronically Signed   By: Thornell Sartorius M.D.   On: 04/16/2023 03:05   CT Soft Tissue Neck W Contrast  Result Date: 04/16/2023 CLINICAL DATA:  Recent esophageal procedure.  Difficulty eating. EXAM: CT NECK WITH CONTRAST TECHNIQUE: Multidetector CT imaging of the neck was performed using the standard protocol following the bolus administration of intravenous contrast. RADIATION DOSE REDUCTION: This exam was performed according to the departmental dose-optimization program which includes automated exposure control, adjustment of the mA and/or kV according to patient size and/or use of iterative reconstruction technique. CONTRAST:  75mL OMNIPAQUE IOHEXOL 350 MG/ML SOLN COMPARISON:  None Available. FINDINGS: Pharynx and larynx: There is asymmetric hypodensity within the right tongue. Oral cavity is otherwise unremarkable. The pharynx, larynx and epiglottis are normal. Salivary glands: No inflammation, mass, or stone. Thyroid: Normal. Lymph nodes: None enlarged or abnormal density. Vascular: Calcific aortic atherosclerosis. Limited intracranial: Negative. Visualized orbits: Negative. Mastoids and visualized paranasal sinuses: Clear. Skeleton: No acute or aggressive process.  C3-4 ACDF. Upper chest: Emphysema with biapical scarring and interstitial opacity. Other: None. IMPRESSION: 1. Asymmetric hypodensity within the right tongue, which could indicate a mass. Correlate with direct visualization/palpation. 2. No focal abnormality along the course of the esophagus. Aortic Atherosclerosis (ICD10-I70.0) and Emphysema (ICD10-J43.9). Electronically  Signed   By: Deatra Robinson M.D.   On: 04/16/2023 02:24   DG Chest 2 View  Result Date: 04/15/2023 CLINICAL DATA:  Pain EXAM: CHEST - 2 VIEW COMPARISON:  04/10/2023 FINDINGS: Stable heart size. Prominent interstitial markings persist throughout the right lung, similar to prior. Probable small right pleural effusion. Left lung is clear. No pneumothorax. IMPRESSION: Persistent prominent interstitial markings throughout the right lung with probable small right pleural effusion. Electronically Signed   By: Duanne Guess D.O.   On: 04/15/2023 17:41   DG Hand Complete Left  Result Date: 04/15/2023 CLINICAL DATA:  Left wrist pain EXAM: LEFT HAND - COMPLETE 3 VIEW COMPARISON:  None Available. FINDINGS: No acute fracture or dislocation. Preserved bone mineralization. Multifocal osteoarthritic changes identified along the interphalangeal joints as well as along the first and second metacarpophalangeal joints, the first carpometacarpal joint. Are also chondrocalcinosis seen about the wrist with some widening of the scapholunate space. Dedicated wrist x-rays could be considered when appropriate. IMPRESSION: Multifocal osteoarthritic changes about the hand and wrist. Chondrocalcinosis. Widening of the scapholunate  space. Dedicated wrist x-rays could be considered when clinically appropriate to further delineate Electronically Signed   By: Karen Kays M.D.   On: 04/15/2023 17:39   DG CHEST PORT 1 VIEW  Result Date: 04/10/2023 CLINICAL DATA:  Nausea, vomiting EXAM: PORTABLE CHEST 1 VIEW COMPARISON:  Previous studies including the examination done earlier today FINDINGS: Cardiac size is within normal limits. There are no signs of pulmonary edema. There is prominence of interstitial markings in right lung and left lower lung field with no significant interval change. There is no focal consolidation. There is no pleural effusion or pneumothorax. IMPRESSION: There is prominence of interstitial markings in right lung and  left lower lung fields suggesting scarring and possibly superimposed interstitial pneumonia. There is no focal pulmonary consolidation. There is no pleural effusion or pneumothorax. Electronically Signed   By: Ernie Avena M.D.   On: 04/10/2023 15:32   CT ABDOMEN PELVIS W CONTRAST  Result Date: 04/10/2023 CLINICAL DATA:  77 year old male with epigastric pain, nausea vomiting for 4 days. Right flank pain and no recent bowel movement. EXAM: CT ABDOMEN AND PELVIS WITH CONTRAST TECHNIQUE: Multidetector CT imaging of the abdomen and pelvis was performed using the standard protocol following bolus administration of intravenous contrast. RADIATION DOSE REDUCTION: This exam was performed according to the departmental dose-optimization program which includes automated exposure control, adjustment of the mA and/or kV according to patient size and/or use of iterative reconstruction technique. CONTRAST:  75mL OMNIPAQUE IOHEXOL 350 MG/ML SOLN COMPARISON:  [facility] chest CTA 08/21/2021 and CTA abdomen and runoff 10/15/2017. FINDINGS: Lower chest: Emphysema demonstrated on the 2022 CT with upper lobe predominance and heterogeneous peribronchial opacity at the lung bases today most resembles postinflammatory scarring, with occasional small calcified lung nodules. No pleural effusion. Heart size within normal limits. No pericardial effusion. Hepatobiliary: Gallbladder mildly distended but does not appear inflamed and no sludge or stones are evident. No biliary ductal dilatation. Liver enhancement within normal limits. Pancreas: Partially atrophied but no active inflammation. Mild dystrophic calcifications, no main pancreatic ductal dilatation. Spleen: Negative.  Punctate calcified granuloma. Adrenals/Urinary Tract: Negative adrenal glands. Nonobstructed kidneys with symmetric renal enhancement and contrast excretion. Decompressed ureters. Unremarkable bladder. Stomach/Bowel: Redundant sigmoid colon with mild  diverticulosis, but the sigmoid is fairly decompressed. There is mild retained stool in the rectum. Upstream moderate retained stool in redundant transverse colon and colonic flexures. Terminal ileum is decompressed. No large bowel inflammation is identified and decompressed normal appendix is suspected on coronal image 77. No dilated small bowel. No free air, free fluid, or mesenteric inflammation identified. Chronic mild to moderate hiatal hernia. And there is circumferential wall thickening of the visible distal esophagus (series 3, image 5). Small chronic surgical clips along the anterior gastric hernia. Intra-abdominal stomach and duodenum are decompressed. There is a small 2.3 cm diverticulum at the ligament of Treitz which is chronic (series 3, image 37) with no adjacent inflammation. Vascular/Lymphatic: Extensive Aortoiliac calcified atherosclerosis. Major arterial structures in the abdomen and pelvis remain patent. Normal caliber abdominal aorta. Portal venous system appears patent. No lymphadenopathy identified. Reproductive: Mild prostatomegaly.  Pelvic phleboliths. Other: No pelvis free fluid. Musculoskeletal: Multilevel spine degeneration. No acute or suspicious osseous lesion. IMPRESSION: 1. Distal esophageal wall thickening compatible with a degree of Esophagitis., Superimposed on moderate chronic gastric hiatal hernia. 2. But decompressed stomach. No gastro duodenal inflammation is evident. Small chronic distal duodenal diverticulum. 3. No other acute or inflammatory process identified in the abdomen or pelvis. Redundant large bowel with retained  stool. Aortic Atherosclerosis (ICD10-I70.0). Chronic postinflammatory scarring suspected at both lung bases. Electronically Signed   By: Odessa Fleming M.D.   On: 04/10/2023 05:36   DG Chest 2 View  Result Date: 04/10/2023 CLINICAL DATA:  Concern for pneumonia EXAM: CHEST - 2 VIEW COMPARISON:  12/22/2020 FINDINGS: Heart and mediastinal contours within normal  limits. Interstitial prominence throughout the lungs, right greater than left, similar to prior study. This could reflect chronic lung disease. No visible effusions or acute bony abnormality. IMPRESSION: Interstitial prominence within the lungs, slightly greater on the right. Favor chronic lung disease. Electronically Signed   By: Charlett Nose M.D.   On: 04/10/2023 03:39   (Echo, Carotid, EGD, Colonoscopy, ERCP)    Subjective: Patient seen and examined.  Family at the bedside.  Ready to transfer to rehab.  Not having any bowel movement for the last 5 days but this is normal for him.  Denies any abdominal pain nausea vomiting.   Discharge Exam: Vitals:   04/20/23 0831 04/20/23 0839  BP: (!) 153/63   Pulse: 68   Resp: 17   Temp: 97.6 F (36.4 C)   SpO2: 97% 97%   Vitals:   04/19/23 2011 04/20/23 0548 04/20/23 0831 04/20/23 0839  BP: (!) 155/74 (!) 159/78 (!) 153/63   Pulse: 69 62 68   Resp: 19 18 17    Temp: 97.9 F (36.6 C) 98.2 F (36.8 C) 97.6 F (36.4 C)   TempSrc: Oral Oral Oral   SpO2: 98% 97% 97% 97%  Weight:      Height:        General: Pt is alert, awake, not in acute distress Cardiovascular: RRR, S1/S2 +, no rubs, no gallops Respiratory: CTA bilaterally, no wheezing, no rhonchi Abdominal: Soft, NT, ND, bowel sounds + Extremities:  no cyanosis, mild edema of the left base of the thumb without tenderness, no erythema or redness.    The results of significant diagnostics from this hospitalization (including imaging, microbiology, ancillary and laboratory) are listed below for reference.     Microbiology: Recent Results (from the past 240 hour(s))  Surgical pcr screen     Status: None   Collection Time: 04/10/23 10:30 PM   Specimen: Nasal Mucosa; Nasal Swab  Result Value Ref Range Status   MRSA, PCR NEGATIVE NEGATIVE Final   Staphylococcus aureus NEGATIVE NEGATIVE Final    Comment: (NOTE) The Xpert SA Assay (FDA approved for NASAL specimens in patients 22 years  of age and older), is one component of a comprehensive surveillance program. It is not intended to diagnose infection nor to guide or monitor treatment. Performed at [facility], [address] [place], [place] [address]   Blood Culture (routine x 2)     Status: None (Preliminary result)   Collection Time: 04/15/23 11:41 PM   Specimen: BLOOD  Result Value Ref Range Status   Specimen Description BLOOD SITE NOT SPECIFIED  Final   Special Requests   Final    BOTTLES DRAWN AEROBIC AND ANAEROBIC Blood Culture adequate volume   Culture   Final    NO GROWTH 4 DAYS Performed at [facility], [address], [place], [place] [address]    Report Status PENDING  Incomplete  Resp panel by RT-PCR (RSV, Flu A&B, Covid) Anterior Nasal Swab     Status: None   Collection Time: 04/16/23  1:15 AM   Specimen: Anterior Nasal Swab  Result Value Ref Range Status   SARS Coronavirus 2 by RT  PCR NEGATIVE NEGATIVE Final   Influenza A by PCR NEGATIVE NEGATIVE Final   Influenza B by PCR NEGATIVE NEGATIVE Final    Comment: (NOTE) The Xpert Xpress SARS-CoV-2/FLU/RSV plus assay is intended as an aid in the diagnosis of influenza from Nasopharyngeal swab specimens and should not be used as a sole basis for treatment. Nasal washings and aspirates are unacceptable for Xpert Xpress SARS-CoV-2/FLU/RSV testing.  Fact Sheet for Patients: BloggerCourse.com  Fact Sheet for Healthcare Providers: SeriousBroker.it  This test is not yet approved or cleared by the [place] FDA and has been authorized for detection and/or diagnosis of SARS-CoV-2 by FDA under an Emergency Use Authorization (EUA). This EUA will remain in effect (meaning this test can be used) for the duration of the COVID-19 declaration under Section 564(b)(1) of the Act, 21 U.S.C. section 360bbb-3(b)(1), unless the authorization is terminated or revoked.     Resp Syncytial Virus by  PCR NEGATIVE NEGATIVE Final    Comment: (NOTE) Fact Sheet for Patients: BloggerCourse.com  Fact Sheet for Healthcare Providers: SeriousBroker.it  This test is not yet approved or cleared by the [place] FDA and has been authorized for detection and/or diagnosis of SARS-CoV-2 by FDA under an Emergency Use Authorization (EUA). This EUA will remain in effect (meaning this test can be used) for the duration of the COVID-19 declaration under Section 564(b)(1) of the Act, 21 U.S.C. section 360bbb-3(b)(1), unless the authorization is terminated or revoked.  Performed at [facility], [address] [place], [place] [address]   Blood Culture (routine x 2)     Status: None (Preliminary result)   Collection Time: 04/16/23  1:15 AM   Specimen: BLOOD RIGHT FOREARM  Result Value Ref Range Status   Specimen Description BLOOD RIGHT FOREARM  Final   Special Requests   Final    BOTTLES DRAWN AEROBIC AND ANAEROBIC Blood Culture adequate volume   Culture   Final    NO GROWTH 4 DAYS Performed at [facility], [address] [place], [place] [address]    Report Status PENDING  Incomplete     Labs: BNP (last 3 results) No results for input(s): "BNP" in the last 8760 hours. Basic Metabolic Panel: Recent Labs  Lab 04/15/23 1624 04/16/23 0853 04/17/23 0331  NA 139 138 137  K 3.9 3.7 3.6  CL 105 107 107  CO2 24 19* 18*  GLUCOSE 134* 99 111*  BUN 10 13 11   CREATININE 1.01 1.04 0.89  CALCIUM 8.6* 8.3* 8.3*   Liver Function Tests: Recent Labs  Lab 04/15/23 1624  AST 18  ALT 16  ALKPHOS 53  BILITOT 0.8  PROT 6.3*  ALBUMIN 2.4*   No results for input(s): "LIPASE", "AMYLASE" in the last 168 hours. No results for input(s): "AMMONIA" in the last 168 hours. CBC: Recent Labs  Lab 04/15/23 1624 04/16/23 0853 04/17/23 0331  WBC 15.5* 14.3* 11.9*  NEUTROABS 11.4* 11.0*  --   HGB 10.9* 10.0* 9.3*  HCT 34.4* 30.6*  28.1*  MCV 93.2 92.2 89.5  PLT 419* 437* 456*   Cardiac Enzymes: No results for input(s): "CKTOTAL", "CKMB", "CKMBINDEX", "TROPONINI" in the last 168 hours. BNP: Invalid input(s): "POCBNP" CBG: Recent Labs  Lab 04/19/23 0728 04/19/23 1132 04/19/23 1647 04/19/23 2146 04/20/23 0836  GLUCAP 103* 125* 97 115* 122*   D-Dimer No results for input(s): "DDIMER" in the last 72 hours. Hgb A1c No results for input(s): "HGBA1C" in the last 72 hours. Lipid Profile No results for  input(s): "CHOL", "HDL", "LDLCALC", "TRIG", "CHOLHDL", "LDLDIRECT" in the last 72 hours. Thyroid function studies No results for input(s): "TSH", "T4TOTAL", "T3FREE", "THYROIDAB" in the last 72 hours.  Invalid input(s): "FREET3" Anemia work up No results for input(s): "VITAMINB12", "FOLATE", "FERRITIN", "TIBC", "IRON", "RETICCTPCT" in the last 72 hours. Urinalysis    Component Value Date/Time   COLORURINE AMBER (A) 12/22/2020 1235   APPEARANCEUR CLOUDY (A) 12/22/2020 1235   LABSPEC 1.019 12/22/2020 1235   PHURINE 8.0 12/22/2020 1235   GLUCOSEU NEGATIVE 12/22/2020 1235   GLUCOSEU NEGATIVE 08/31/2019 1653   HGBUR NEGATIVE 12/22/2020 1235   BILIRUBINUR NEGATIVE 12/22/2020 1235   KETONESUR NEGATIVE 12/22/2020 1235   PROTEINUR 100 (A) 12/22/2020 1235   UROBILINOGEN 0.2 08/31/2019 1653   NITRITE NEGATIVE 12/22/2020 1235   LEUKOCYTESUR NEGATIVE 12/22/2020 1235   Sepsis Labs Recent Labs  Lab 04/15/23 1624 04/16/23 0853 04/17/23 0331  WBC 15.5* 14.3* 11.9*   Microbiology Recent Results (from the past 240 hour(s))  Surgical pcr screen     Status: None   Collection Time: 04/10/23 10:30 PM   Specimen: Nasal Mucosa; Nasal Swab  Result Value Ref Range Status   MRSA, PCR NEGATIVE NEGATIVE Final   Staphylococcus aureus NEGATIVE NEGATIVE Final    Comment: (NOTE) The Xpert SA Assay (FDA approved for NASAL specimens in patients 31 years of age and older), is one component of a comprehensive surveillance  program. It is not intended to diagnose infection nor to guide or monitor treatment. Performed at [facility], [address] [place], [place] [address]   Blood Culture (routine x 2)     Status: None (Preliminary result)   Collection Time: 04/15/23 11:41 PM   Specimen: BLOOD  Result Value Ref Range Status   Specimen Description BLOOD SITE NOT SPECIFIED  Final   Special Requests   Final    BOTTLES DRAWN AEROBIC AND ANAEROBIC Blood Culture adequate volume   Culture   Final    NO GROWTH 4 DAYS Performed at [facility], [address], [place], [place] [address]    Report Status PENDING  Incomplete  Resp panel by RT-PCR (RSV, Flu A&B, Covid) Anterior Nasal Swab     Status: None   Collection Time: 04/16/23  1:15 AM   Specimen: Anterior Nasal Swab  Result Value Ref Range Status   SARS Coronavirus 2 by RT PCR NEGATIVE NEGATIVE Final   Influenza A by PCR NEGATIVE NEGATIVE Final   Influenza B by PCR NEGATIVE NEGATIVE Final    Comment: (NOTE) The Xpert Xpress SARS-CoV-2/FLU/RSV plus assay is intended as an aid in the diagnosis of influenza from Nasopharyngeal swab specimens and should not be used as a sole basis for treatment. Nasal washings and aspirates are unacceptable for Xpert Xpress SARS-CoV-2/FLU/RSV testing.  Fact Sheet for Patients: BloggerCourse.com  Fact Sheet for Healthcare Providers: SeriousBroker.it  This test is not yet approved or cleared by the [place] FDA and has been authorized for detection and/or diagnosis of SARS-CoV-2 by FDA under an Emergency Use Authorization (EUA). This EUA will remain in effect (meaning this test can be used) for the duration of the COVID-19 declaration under Section 564(b)(1) of the Act, 21 U.S.C. section 360bbb-3(b)(1), unless the authorization is terminated or revoked.     Resp Syncytial Virus by PCR NEGATIVE NEGATIVE Final    Comment: (NOTE) Fact Sheet for  Patients: BloggerCourse.com  Fact Sheet for Healthcare Providers: SeriousBroker.it  This test is not yet approved or cleared by the [place]  States FDA and has been authorized for detection and/or diagnosis of SARS-CoV-2 by FDA under an Emergency Use Authorization (EUA). This EUA will remain in effect (meaning this test can be used) for the duration of the COVID-19 declaration under Section 564(b)(1) of the Act, 21 U.S.C. section 360bbb-3(b)(1), unless the authorization is terminated or revoked.  Performed at [facility], [address], [place], [place] [address]   Blood Culture (routine x 2)     Status: None (Preliminary result)   Collection Time: 04/16/23  1:15 AM   Specimen: BLOOD RIGHT FOREARM  Result Value Ref Range Status   Specimen Description BLOOD RIGHT FOREARM  Final   Special Requests   Final    BOTTLES DRAWN AEROBIC AND ANAEROBIC Blood Culture adequate volume   Culture   Final    NO GROWTH 4 DAYS Performed at [facility], [address], [place], [place] [address]    Report Status PENDING  Incomplete     Time coordinating discharge: 35 minutes  SIGNED:   Dorcas Carrow, MD  [facility] 04/20/2023, 10:48 AM

## 2023-04-20 NOTE — TOC Transition Note (Signed)
Transition of Care [facility]) - CM/SW Discharge Note   Patient Details  Name: [name] MRN: [id number] Date of Birth: [birth year]  [facility] CM/SW Contact:  Jimmy Picket, LCSW Phone Number: 04/20/2023, 12:45 PM   Clinical Narrative:     Per MD patient ready for DC to [facility]. RN, patient, patient's family, and facility notified of DC. Discharge Summary and FL2 sent to facility. DC packet on chart. Insurance Berkley Harvey has been received.  Pt will be transported by family vehicle.    RN to call report to [phone].  CSW will sign off for now as social work intervention is no longer needed. Please consult [place] again if new needs arise.   Final next level of care: Skilled Nursing Facility Barriers to Discharge: Barriers Resolved   Patient Goals and CMS Choice      Discharge Placement                Patient chooses bed at: [facility] Patient to be transferred to facility by: Family Car Name of family member notified: Famiy Patient and family notified of of transfer: 04/20/23  Discharge Plan and Services Additional resources added to the After Visit Summary for                                       Social Determinants of Health (SDOH) Interventions SDOH Screenings   Food Insecurity: No Food Insecurity (04/16/2023)  Housing: Low Risk  (04/16/2023)  Transportation Needs: No Transportation Needs (04/16/2023)  Utilities: Not At Risk (04/16/2023)  Depression (PHQ2-9): Low Risk  (08/31/2019)  Tobacco Use: Medium Risk (04/15/2023)     Readmission Risk Interventions     No data to display

## 2023-04-20 NOTE — Plan of Care (Signed)
Pt discharging to facility, per pt and pt family request, pt wanting to be taken to facility via family. RN made MD and social worker aware. MD aware, Pt seen by MD before discharging. RN went over AVS discharge instructions with pt and his family, one printed prescription given to pt, RN printed a copy of discharge paperwork for facility, pt daughter agreeable to give to facility. RN called report to facility and all questions answered.

## 2023-04-21 LAB — CULTURE, BLOOD (ROUTINE X 2): Culture: NO GROWTH

## 2023-04-22 DIAGNOSIS — I2510 Atherosclerotic heart disease of native coronary artery without angina pectoris: Secondary | ICD-10-CM | POA: Diagnosis not present

## 2023-04-22 DIAGNOSIS — R262 Difficulty in walking, not elsewhere classified: Secondary | ICD-10-CM | POA: Diagnosis not present

## 2023-04-22 DIAGNOSIS — I509 Heart failure, unspecified: Secondary | ICD-10-CM | POA: Diagnosis not present

## 2023-04-22 DIAGNOSIS — E1151 Type 2 diabetes mellitus with diabetic peripheral angiopathy without gangrene: Secondary | ICD-10-CM | POA: Diagnosis not present

## 2023-04-29 DIAGNOSIS — R911 Solitary pulmonary nodule: Secondary | ICD-10-CM | POA: Diagnosis not present

## 2023-04-29 DIAGNOSIS — R5383 Other fatigue: Secondary | ICD-10-CM | POA: Diagnosis not present

## 2023-04-29 DIAGNOSIS — R0600 Dyspnea, unspecified: Secondary | ICD-10-CM | POA: Diagnosis not present

## 2023-04-29 DIAGNOSIS — I509 Heart failure, unspecified: Secondary | ICD-10-CM | POA: Diagnosis not present

## 2023-04-29 DIAGNOSIS — J301 Allergic rhinitis due to pollen: Secondary | ICD-10-CM | POA: Diagnosis not present

## 2023-04-29 DIAGNOSIS — G2581 Restless legs syndrome: Secondary | ICD-10-CM | POA: Diagnosis not present

## 2023-04-29 DIAGNOSIS — J4540 Moderate persistent asthma, uncomplicated: Secondary | ICD-10-CM | POA: Diagnosis not present

## 2023-04-29 DIAGNOSIS — J449 Chronic obstructive pulmonary disease, unspecified: Secondary | ICD-10-CM | POA: Diagnosis not present

## 2023-04-30 ENCOUNTER — Telehealth: Payer: Self-pay | Admitting: Cardiology

## 2023-04-30 NOTE — Telephone Encounter (Signed)
  Pt c/o medication issue:  1. Name of Medication: Plavix   2. How are you currently taking this medication (dosage and times per day)?   3. Are you having a reaction (difficulty breathing--STAT)? No   4. What is your medication issue? Pt's wife said, when pt was in the hospital Dr. Maurine Minister discontinued pt's plavix. She said, pt is in rehab and has swelling on his legs and bags under his eyes. She would like to know if pt needs to take plavix again

## 2023-04-30 NOTE — Telephone Encounter (Signed)
Advised to call PCP regarding the Plavix as he was on it for a past stroke. Pt is at [facility], advised to talk with staff and have them get provider over the facility regarding swelling and see if daily weights are bing obtained. [name] verbalized understanding and had no additional questions.

## 2023-05-03 ENCOUNTER — Encounter: Payer: Self-pay | Admitting: Cardiology

## 2023-05-03 DIAGNOSIS — D631 Anemia in chronic kidney disease: Secondary | ICD-10-CM | POA: Diagnosis not present

## 2023-05-03 DIAGNOSIS — I5022 Chronic systolic (congestive) heart failure: Secondary | ICD-10-CM | POA: Diagnosis not present

## 2023-05-03 DIAGNOSIS — E1169 Type 2 diabetes mellitus with other specified complication: Secondary | ICD-10-CM | POA: Diagnosis not present

## 2023-05-03 DIAGNOSIS — I130 Hypertensive heart and chronic kidney disease with heart failure and stage 1 through stage 4 chronic kidney disease, or unspecified chronic kidney disease: Secondary | ICD-10-CM | POA: Diagnosis not present

## 2023-05-03 DIAGNOSIS — M65842 Other synovitis and tenosynovitis, left hand: Secondary | ICD-10-CM | POA: Diagnosis not present

## 2023-05-03 DIAGNOSIS — G8929 Other chronic pain: Secondary | ICD-10-CM | POA: Diagnosis not present

## 2023-05-03 DIAGNOSIS — I700 Atherosclerosis of aorta: Secondary | ICD-10-CM | POA: Diagnosis not present

## 2023-05-03 DIAGNOSIS — E559 Vitamin D deficiency, unspecified: Secondary | ICD-10-CM | POA: Diagnosis not present

## 2023-05-03 DIAGNOSIS — L03114 Cellulitis of left upper limb: Secondary | ICD-10-CM | POA: Diagnosis not present

## 2023-05-03 DIAGNOSIS — J4489 Other specified chronic obstructive pulmonary disease: Secondary | ICD-10-CM | POA: Diagnosis not present

## 2023-05-03 DIAGNOSIS — K2971 Gastritis, unspecified, with bleeding: Secondary | ICD-10-CM | POA: Diagnosis not present

## 2023-05-03 DIAGNOSIS — N189 Chronic kidney disease, unspecified: Secondary | ICD-10-CM | POA: Diagnosis not present

## 2023-05-03 DIAGNOSIS — E1122 Type 2 diabetes mellitus with diabetic chronic kidney disease: Secondary | ICD-10-CM | POA: Diagnosis not present

## 2023-05-03 DIAGNOSIS — E46 Unspecified protein-calorie malnutrition: Secondary | ICD-10-CM | POA: Diagnosis not present

## 2023-05-03 DIAGNOSIS — I69354 Hemiplegia and hemiparesis following cerebral infarction affecting left non-dominant side: Secondary | ICD-10-CM | POA: Diagnosis not present

## 2023-05-03 DIAGNOSIS — D519 Vitamin B12 deficiency anemia, unspecified: Secondary | ICD-10-CM | POA: Diagnosis not present

## 2023-05-03 DIAGNOSIS — K2101 Gastro-esophageal reflux disease with esophagitis, with bleeding: Secondary | ICD-10-CM | POA: Diagnosis not present

## 2023-05-03 DIAGNOSIS — J439 Emphysema, unspecified: Secondary | ICD-10-CM | POA: Diagnosis not present

## 2023-05-03 DIAGNOSIS — N401 Enlarged prostate with lower urinary tract symptoms: Secondary | ICD-10-CM | POA: Diagnosis not present

## 2023-05-03 DIAGNOSIS — M549 Dorsalgia, unspecified: Secondary | ICD-10-CM | POA: Diagnosis not present

## 2023-05-03 DIAGNOSIS — I2510 Atherosclerotic heart disease of native coronary artery without angina pectoris: Secondary | ICD-10-CM | POA: Diagnosis not present

## 2023-05-03 DIAGNOSIS — E785 Hyperlipidemia, unspecified: Secondary | ICD-10-CM | POA: Diagnosis not present

## 2023-05-03 DIAGNOSIS — E1140 Type 2 diabetes mellitus with diabetic neuropathy, unspecified: Secondary | ICD-10-CM | POA: Diagnosis not present

## 2023-05-03 DIAGNOSIS — R350 Frequency of micturition: Secondary | ICD-10-CM | POA: Diagnosis not present

## 2023-05-08 DIAGNOSIS — I509 Heart failure, unspecified: Secondary | ICD-10-CM | POA: Diagnosis not present

## 2023-05-08 DIAGNOSIS — E538 Deficiency of other specified B group vitamins: Secondary | ICD-10-CM | POA: Diagnosis not present

## 2023-05-08 DIAGNOSIS — Z6823 Body mass index (BMI) 23.0-23.9, adult: Secondary | ICD-10-CM | POA: Diagnosis not present

## 2023-05-08 DIAGNOSIS — I69354 Hemiplegia and hemiparesis following cerebral infarction affecting left non-dominant side: Secondary | ICD-10-CM | POA: Diagnosis not present

## 2023-05-08 DIAGNOSIS — E1169 Type 2 diabetes mellitus with other specified complication: Secondary | ICD-10-CM | POA: Diagnosis not present

## 2023-05-08 DIAGNOSIS — E785 Hyperlipidemia, unspecified: Secondary | ICD-10-CM | POA: Diagnosis not present

## 2023-05-08 DIAGNOSIS — E039 Hypothyroidism, unspecified: Secondary | ICD-10-CM | POA: Diagnosis not present

## 2023-05-08 DIAGNOSIS — Z79899 Other long term (current) drug therapy: Secondary | ICD-10-CM | POA: Diagnosis not present

## 2023-05-14 ENCOUNTER — Encounter: Payer: Self-pay | Admitting: Cardiology

## 2023-05-14 ENCOUNTER — Ambulatory Visit: Payer: PPO | Attending: Cardiology | Admitting: Cardiology

## 2023-05-14 VITALS — BP 146/78 | HR 68 | Ht 71.6 in | Wt 176.8 lb

## 2023-05-14 DIAGNOSIS — I429 Cardiomyopathy, unspecified: Secondary | ICD-10-CM | POA: Diagnosis not present

## 2023-05-14 DIAGNOSIS — G459 Transient cerebral ischemic attack, unspecified: Secondary | ICD-10-CM

## 2023-05-14 DIAGNOSIS — E782 Mixed hyperlipidemia: Secondary | ICD-10-CM

## 2023-05-14 DIAGNOSIS — I10 Essential (primary) hypertension: Secondary | ICD-10-CM | POA: Diagnosis not present

## 2023-05-14 DIAGNOSIS — E088 Diabetes mellitus due to underlying condition with unspecified complications: Secondary | ICD-10-CM

## 2023-05-14 DIAGNOSIS — I2510 Atherosclerotic heart disease of native coronary artery without angina pectoris: Secondary | ICD-10-CM

## 2023-05-14 NOTE — Progress Notes (Signed)
Cardiology Office Note:    Date:  05/14/2023   ID:  [name], DOB 03/27/1946, MRN [id number]  PCP:  Eunice Blase, PA-C  Cardiologist:  Garwin Brothers, MD   Referring MD: Eunice Blase, PA-C    ASSESSMENT:    1. Coronary artery disease involving native coronary artery of native heart without angina pectoris   2. Cardiomyopathy, unspecified type (HCC)   3. TIA (transient ischemic attack)   4. Essential hypertension   5. Diabetes mellitus due to underlying condition with unspecified complications (HCC)   6. Mixed hyperlipidemia    PLAN:    In order of problems listed above:  Coronary artery disease: Secondary prevention stressed with the patient.  Importance of compliance with diet medication stressed and he vocalized understanding. Nonischemic cardiomyopathy: Echo was reviewed.  Patient is on guideline directed medications.  This was discussed with him at length.  He has recent blood work with primary care Essential hypertension: Blood pressure stable and diet was emphasized. Diabetes mellitus: Followed by primary care.  Hemoglobin A1c appears to be stable. History of anemia: Low hemoglobin in the range of 9 and followed by primary care. Patient will be seen in follow-up appointment in 6 months or earlier if the patient has any concerns.    Medication Adjustments/Labs and Tests Ordered: Current medicines are reviewed at length with the patient today.  Concerns regarding medicines are outlined above.  No orders of the defined types were placed in this encounter.  No orders of the defined types were placed in this encounter.    No chief complaint on file.    History of Present Illness:    [name] is a 77 y.o. male.  Patient has past medical history of coronary artery disease, nonischemic cardiomyopathy, essential hypertension and diabetes mellitus and mixed dyslipidemia.  He ambulates with a walker.  He denies any chest pain orthopnea or PND.  At the time of  my evaluation, the patient is alert awake oriented and in no distress.  Past Medical History:  Diagnosis Date   Abnormal loss of weight 08/31/2019   Abnormality of gait 03/31/2013   Acute blood loss anemia 04/11/2023   ALLERGIC RHINITIS 11/06/2007   Qualifier: Diagnosis of   By: Jonny Ruiz MD, Len Blalock     Replacing diagnoses that were inactivated after the 02/25/23 regulatory import   Anemia, unspecified 08/22/2012   BENIGN PROSTATIC HYPERTROPHY 07/14/2007   CAD (coronary artery disease) 08/01/2022   Cardiomyopathy (HCC) 06/11/2022   Cellulitis of left hand 04/16/2023   Cerebrovascular disease 04/11/2023   Cervical spondylosis with radiculopathy 06/11/2022   Chronic osteomyelitis of toe of left foot (HCC) 09/12/2017   Chronic right shoulder pain 06/29/2022   COLONIC POLYPS, HX OF 07/14/2007   Complication of anesthesia    pt states he needs to be cathed after surgery   Controlled type 2 diabetes mellitus without complication, without long-term current use of insulin (HCC) 08/22/2012   COPD (chronic obstructive pulmonary disease) (HCC) 04/11/2023   COVID-19    Degenerative arthritis of right knee 12/12/2011   Diabetes (HCC) 02/03/2013   Diet controlled   Diabetes mellitus due to underlying condition with unspecified complications (HCC) 06/11/2022   Disequilibrium 03/31/2013   Dyspnea 08/31/2019   Encounter for well adult exam with abnormal findings 12/10/2011   Essential hypertension 07/11/2007   Qualifier: Diagnosis of  By: Maris Berger    Former smoker 02/08/2015   GERD 07/11/2007   GERD with esophagitis 04/10/2023   Heart  failure with reduced ejection fraction (HCC) 04/10/2023   Hematemesis due to hiatal hernia/esophagitis 04/10/2023   Hiatal hernia 04/11/2023   Hypercalcemia 08/12/2015   Hyperlipidemia    Hyperthyroidism    Hypervitaminosis D 01/14/2019   Hypokalemia 04/10/2023   Hypothyroidism    Hypoxia    Lacunar infarction (HCC) 03/31/2013   Microalbuminuria  08/31/2019   Neck pain 03/26/2022   Neural foraminal stenosis of cervical spine 06/11/2022   Odynophagia 04/10/2023   OSTEOARTHRITIS, KNEE, RIGHT 10/27/2008   PERIPHERAL EDEMA 05/31/2009   Polyarthralgia 09/12/2017   Primary osteoarthritis of right shoulder 06/29/2022   PULMONARY NODULE 06/24/2008   granuloma   Recurrent falls 08/17/2016   Right shoulder pain 08/17/2016   Rotator cuff arthropathy 09/07/2016   Injected 09/07/2016 Repeat injection 01/07/17 Repeat injection April 20th 2018. Repeat injection 05/22/2017 Repeat injection 08/15/2017   Skin lesion 02/19/2017   Spinal stenosis in cervical region 03/31/2013   Spondylosis, cervical, with myelopathy 11/11/2012   TIA (transient ischemic attack) 08/13/2015   Upper back pain on right side 08/31/2019   Vitamin D deficiency 08/12/2015    Past Surgical History:  Procedure Laterality Date   ANTERIOR CERVICAL DECOMP/DISCECTOMY FUSION N/A 01/06/2013   Procedure: ANTERIOR CERVICAL DECOMPRESSION/DISCECTOMY FUSION 1 LEVEL;  Surgeon: Maeola Harman, MD;  Location: [facility] NEURO ORS;  Service: Neurosurgery;  Laterality: N/A;  Cervical three-four Anterior cervical decompression/diskectomy/fusion   BACK SURGERY  1986   x 2  1986 C 3   ESOPHAGOGASTRODUODENOSCOPY (EGD) WITH PROPOFOL N/A 04/11/2023   Procedure: ESOPHAGOGASTRODUODENOSCOPY (EGD) WITH PROPOFOL;  Surgeon: Sherrilyn Rist, MD;  Location: [facility] END[facility]COPY;  Service: Gastroenterology;  Laterality: N/A;   HERNIA REPAIR  sept 1986   left knee replacement  Nov 21, 2007   NECK SURGERY     NISSEN FUNDOPLICATION  2000   right heel bone spur  1987   TOTAL KNEE ARTHROPLASTY  01/09/2012   Procedure: TOTAL KNEE ARTHROPLASTY;  Surgeon: Jacki Cones, MD;  Location: [facility] ORS;  Service: Orthopedics;  Laterality: Right;    Current Medications: Current Meds  Medication Sig   acetaminophen (TYLENOL) 325 MG tablet Take 2 tablets (650 mg total) by mouth every 6 (six) hours as needed for moderate pain, fever  or headache.   albuterol (VENTOLIN HFA) 108 (90 Base) MCG/ACT inhaler Inhale 1-2 puffs into the lungs every 6 (six) hours as needed for wheezing or shortness of breath.   atorvastatin (LIPITOR) 80 MG tablet Take 1 tablet (80 mg total) by mouth daily.   cyanocobalamin (VITAMIN B12) 1000 MCG/ML injection Inject 1 mL (1,000 mcg total) into the muscle every 7 (seven) days for 4 doses.   famotidine (PEPCID) 40 MG tablet Take 40 mg by mouth daily.   gabapentin (NEURONTIN) 400 MG capsule TAKE 1 CAPSULE BY MOUTH 3  TIMES DAILY   labetalol (NORMODYNE) 300 MG tablet TAKE 1 TABLET BY MOUTH  TWICE DAILY   levothyroxine (SYNTHROID) 100 MCG tablet Take 100 mcg by mouth daily before breakfast.   montelukast (SINGULAIR) 10 MG tablet Take 10 mg by mouth daily.   MYRBETRIQ 25 MG TB24 tablet Take 25 mg by mouth daily.   pantoprazole (PROTONIX) 40 MG tablet Take 1 tablet (40 mg total) by mouth 2 (two) times daily before a meal.   polyethylene glycol (MIRALAX / GLYCOLAX) 17 g packet Take 17 g by mouth daily.   sacubitril-valsartan (ENTRESTO) 97-103 MG Take 1 tablet by mouth 2 (two) times daily.   spironolactone (ALDACTONE) 25 MG tablet Take 0.5 tablets (  12.5 mg total) by mouth daily.   traZODone (DESYREL) 50 MG tablet Take 50 mg by mouth at bedtime.   TRELEGY ELLIPTA 100-62.5-25 MCG/ACT AEPB Take 1 puff by mouth daily.   Vitamin D, Ergocalciferol, (DRISDOL) 1.25 MG (50000 UNIT) CAPS capsule Take 1 capsule (50,000 Units total) by mouth every 7 (seven) days for 4 doses.     Allergies:   Latex, Codeine, Zolpidem tartrate, Hct [hydrochlorothiazide], Metformin and related, Naproxen, Prednisone, Adhesive [tape], and Penicillins   Social History   Socioeconomic History   Marital status: Married    Spouse name: Eber Jones   Number of children: 2   Years of education: HS   Highest education level: Not on file  Occupational History   Occupation: Retired  Tobacco Use   Smoking status: Former    Packs/day: 3.00     Years: 30.00    Additional pack years: 0.00    Total pack years: 90.00    Types: Cigarettes    Quit date: 11/26/1982    Years since quitting: 40.4   Smokeless tobacco: Never  Substance and Sexual Activity   Alcohol use: No   Drug use: No   Sexual activity: Not on file  Other Topics Concern   Not on file  Social History Narrative   Patient lives at home with spouse.   Caffiene Use: 4 cups daily   Social Determinants of Health   Financial Resource Strain: Not on file  Food Insecurity: No Food Insecurity (04/16/2023)   Hunger Vital Sign    Worried About Running Out of Food in the Last Year: Never true    Ran Out of Food in the Last Year: Never true  Transportation Needs: No Transportation Needs (04/16/2023)   PRAPARE - Administrator, Civil Service (Medical): No    Lack of Transportation (Non-Medical): No  Physical Activity: Not on file  Stress: Not on file  Social Connections: Not on file     Family History: The patient's family history includes Emphysema in his father; Heart attack in his father and mother; Heart disease in an other family member. There is no history of Diabetes, Thyroid disease, or Hypercalcemia.  ROS:   Please see the history of present illness.    All other systems reviewed and are negative.  EKGs/Labs/Other Studies Reviewed:    The following studies were reviewed today: I discussed my findings with the patient at length.   Recent Labs: 04/11/2023: Magnesium 1.8 04/15/2023: ALT 16 04/17/2023: BUN 11; Creatinine, Ser 0.89; Hemoglobin 9.3; Platelets 456; Potassium 3.6; Sodium 137; TSH 6.842  Recent Lipid Panel    Component Value Date/Time   CHOL 147 07/23/2022 0825   TRIG 113 07/23/2022 0825   HDL 40 07/23/2022 0825   CHOLHDL 3.7 07/23/2022 0825   CHOLHDL 3 02/22/2020 1301   VLDL 34.6 02/22/2020 1301   LDLCALC 86 07/23/2022 0825   LDLDIRECT 84.0 08/25/2018 0848    Physical Exam:    VS:  BP (!) 146/78   Pulse 68   Ht 5' 11.6"  (1.819 m)   Wt 176 lb 12.8 oz (80.2 kg)   SpO2 97%   BMI 24.25 kg/m     Wt Readings from Last 3 Encounters:  05/14/23 176 lb 12.8 oz (80.2 kg)  04/16/23 163 lb 9.3 oz (74.2 kg)  04/11/23 175 lb (79.4 kg)     GEN: Patient is in no acute distress HEENT: Normal NECK: No JVD; No carotid bruits LYMPHATICS: No lymphadenopathy CARDIAC: Hear sounds regular,  2/6 systolic murmur at the apex. RESPIRATORY:  Clear to auscultation without rales, wheezing or rhonchi  ABDOMEN: Soft, non-tender, non-distended MUSCULOSKELETAL:  No edema; No deformity  SKIN: Warm and dry NEUROLOGIC:  Alert and oriented x 3 PSYCHIATRIC:  Normal affect   Signed, Garwin Brothers, MD  05/14/2023 3:36 PM    [facility]

## 2023-05-14 NOTE — Patient Instructions (Signed)
Medication Instructions:  Your physician recommends that you continue on your current medications as directed. Please refer to the Current Medication list given to you today.  *If you need a refill on your cardiac medications before your next appointment, please call your pharmacy*   Lab Work: None ordered If you have labs (blood work) drawn today and your tests are completely normal, you will receive your results only by: MyChart Message (if you have MyChart) OR A paper copy in the mail If you have any lab test that is abnormal or we need to change your treatment, we will call you to review the results.   Testing/Procedures: None ordered   Follow-Up: At [facility], you and your health needs are our priority.  As part of our continuing mission to provide you with exceptional heart care, we have created designated Provider Care Teams.  These Care Teams include your primary Cardiologist (physician) and Advanced Practice Providers (APPs -  Physician Assistants and Nurse Practitioners) who all work together to provide you with the care you need, when you need it.  We recommend signing up for the patient portal called "MyChart".  Sign up information is provided on this After Visit Summary.  MyChart is used to connect with patients for Virtual Visits (Telemedicine).  Patients are able to view lab/test results, encounter notes, upcoming appointments, etc.  Non-urgent messages can be sent to your provider as well.   To learn more about what you can do with MyChart, go to [email or web address].    Your next appointment:   9 month(s)  The format for your next appointment:   In Person  Provider:   Rajan Revankar, MD    Other Instructions none  Important Information About Sugar       

## 2023-05-24 DIAGNOSIS — J441 Chronic obstructive pulmonary disease with (acute) exacerbation: Secondary | ICD-10-CM | POA: Diagnosis not present

## 2023-05-24 DIAGNOSIS — R0600 Dyspnea, unspecified: Secondary | ICD-10-CM | POA: Diagnosis not present

## 2023-05-27 ENCOUNTER — Ambulatory Visit: Payer: PPO | Attending: Cardiology | Admitting: Cardiology

## 2023-05-27 ENCOUNTER — Telehealth: Payer: Self-pay | Admitting: Cardiology

## 2023-05-27 ENCOUNTER — Encounter: Payer: Self-pay | Admitting: Cardiology

## 2023-05-27 VITALS — BP 140/80 | HR 80 | Resp 24 | Ht 71.6 in | Wt 183.0 lb

## 2023-05-27 DIAGNOSIS — R5383 Other fatigue: Secondary | ICD-10-CM | POA: Diagnosis not present

## 2023-05-27 DIAGNOSIS — G2581 Restless legs syndrome: Secondary | ICD-10-CM | POA: Diagnosis not present

## 2023-05-27 DIAGNOSIS — E088 Diabetes mellitus due to underlying condition with unspecified complications: Secondary | ICD-10-CM

## 2023-05-27 DIAGNOSIS — I429 Cardiomyopathy, unspecified: Secondary | ICD-10-CM | POA: Diagnosis not present

## 2023-05-27 DIAGNOSIS — R911 Solitary pulmonary nodule: Secondary | ICD-10-CM | POA: Diagnosis not present

## 2023-05-27 DIAGNOSIS — E119 Type 2 diabetes mellitus without complications: Secondary | ICD-10-CM | POA: Diagnosis not present

## 2023-05-27 DIAGNOSIS — I509 Heart failure, unspecified: Secondary | ICD-10-CM | POA: Diagnosis not present

## 2023-05-27 DIAGNOSIS — R0600 Dyspnea, unspecified: Secondary | ICD-10-CM | POA: Diagnosis not present

## 2023-05-27 DIAGNOSIS — I2510 Atherosclerotic heart disease of native coronary artery without angina pectoris: Secondary | ICD-10-CM | POA: Diagnosis not present

## 2023-05-27 DIAGNOSIS — I10 Essential (primary) hypertension: Secondary | ICD-10-CM

## 2023-05-27 DIAGNOSIS — J301 Allergic rhinitis due to pollen: Secondary | ICD-10-CM | POA: Diagnosis not present

## 2023-05-27 DIAGNOSIS — J449 Chronic obstructive pulmonary disease, unspecified: Secondary | ICD-10-CM | POA: Diagnosis not present

## 2023-05-27 MED ORDER — FUROSEMIDE 40 MG PO TABS: 40.0000 mg | ORAL_TABLET | Freq: Every day | ORAL | 3 refills | Status: DC

## 2023-05-27 MED ORDER — ENTRESTO 24-26 MG PO TABS: 1.0000 | ORAL_TABLET | Freq: Two times a day (BID) | ORAL | 0 refills | Status: DC

## 2023-05-27 NOTE — Patient Instructions (Signed)
Medication Instructions:  Your physician has recommended you make the following change in your medication:   Start Lasix 40 mg daily.  Decrease your Entresto to 24-26 mg twice daily for 1 week. Keep BP/HR/weight log for 1 week and bring to nurse visit.  *If you need a refill on your cardiac medications before your next appointment, please call your pharmacy*   Lab Work: Your physician recommends that you have a BMP today.  Your physician recommends that you return for lab work in: 1 week for BMP. You can come Monday through Friday 8:30 am to 12:00 pm and 1:15 to 4:30. You do not need to make an appointment as the order has already been placed.   If you have labs (blood work) drawn today and your tests are completely normal, you will receive your results only by: MyChart Message (if you have MyChart) OR A paper copy in the mail If you have any lab test that is abnormal or we need to change your treatment, we will call you to review the results.   Testing/Procedures: None ordered   Follow-Up: At [facility], you and your health needs are our priority.  As part of our continuing mission to provide you with exceptional heart care, we have created designated Provider Care Teams.  These Care Teams include your primary Cardiologist (physician) and Advanced Practice Providers (APPs -  Physician Assistants and Nurse Practitioners) who all work together to provide you with the care you need, when you need it.  We recommend signing up for the patient portal called "MyChart".  Sign up information is provided on this After Visit Summary.  MyChart is used to connect with patients for Virtual Visits (Telemedicine).  Patients are able to view lab/test results, encounter notes, upcoming appointments, etc.  Non-urgent messages can be sent to your provider as well.   To learn more about what you can do with MyChart, go to [email or web address].    Your next appointment:   1 month(s)  The  format for your next appointment:   In Person  Provider:   Belva Crome, MD    Other Instructions none  Important Information About Sugar

## 2023-05-27 NOTE — Progress Notes (Signed)
Cardiology Office Note:    Date:  05/27/2023   ID:  [name], DOB [birth year], MRN [id number]  PCP:  Eunice Blase, PA-C  Cardiologist:  Garwin Brothers, MD   Referring MD: Eunice Blase, PA-C    ASSESSMENT:    1. Coronary artery disease involving native coronary artery of native heart without angina pectoris   2. Cardiomyopathy, unspecified type (HCC)   3. Essential hypertension   4. Controlled type 2 diabetes mellitus without complication, without long-term current use of insulin (HCC)   5. Diabetes mellitus due to underlying condition with unspecified complications (HCC)    PLAN:    In order of problems listed above:  Coronary artery disease and cardiomyopathy: Secondary prevention stressed with the patient.  Importance of compliance with diet medication stressed and vocalized understanding. Congestive heart failure and weight.:  Patient's ejection fraction is moderately depressed.  I discussed this with the patient at length.  Following recommendations were made.  Will have a Chem-7 today.  I will lower his Entresto dose for now for a week I will initiate him on Lasix 40 mg daily.  He will Track of his weights on a daily basis.  Salt intake issues were discussed and he vocalized understanding and questions were answered to his satisfaction. Essential hypertension: Blood pressure stable and diet was emphasized.   Mixed dyslipidemia: On lipid-lowering medications. Patient will be seen in follow-up appointment in 2 months.  He will be back in 1 week for a Chem-7 pulse blood pressure check weight check and evaluation for medications.   Medication Adjustments/Labs and Tests Ordered: Current medicines are reviewed at length with the patient today.  Concerns regarding medicines are outlined above.  No orders of the defined types were placed in this encounter.  No orders of the defined types were placed in this encounter.    No chief complaint on file.    History of Present  Illness:    [name] is a 77 y.o. male.  Patient has past medical history of coronary artery disease, cardiomyopathy, essential hypertension and dyslipidemia.  He mentions to me that he has gained significant amount of weight in the past several days.  No chest pain orthopnea or PND.  This is of concern to him.  She was  Past Medical History:  Diagnosis Date   Abnormal loss of weight 08/31/2019   Abnormality of gait 03/31/2013   Acute blood loss anemia 04/11/2023   ALLERGIC RHINITIS 11/06/2007   Qualifier: Diagnosis of   By: Jonny Ruiz MD, Len Blalock     Replacing diagnoses that were inactivated after the 02/25/23 regulatory import   Anemia, unspecified 08/22/2012   BENIGN PROSTATIC HYPERTROPHY 07/14/2007   CAD (coronary artery disease) 08/01/2022   Cardiomyopathy (HCC) 06/11/2022   Cellulitis of left hand 04/16/2023   Cerebrovascular disease 04/11/2023   Cervical spondylosis with radiculopathy 06/11/2022   Chronic osteomyelitis of toe of left foot (HCC) 09/12/2017   Chronic right shoulder pain 06/29/2022   COLONIC POLYPS, HX OF 07/14/2007   Complication of anesthesia    pt states he needs to be cathed after surgery   Controlled type 2 diabetes mellitus without complication, without long-term current use of insulin (HCC) 08/22/2012   COPD (chronic obstructive pulmonary disease) (HCC) 04/11/2023   COVID-19    Degenerative arthritis of right knee 12/12/2011   Diabetes (HCC) 02/03/2013   Diet controlled   Diabetes mellitus due to underlying condition with unspecified complications (HCC) 06/11/2022   Disequilibrium 03/31/2013  Dyspnea 08/31/2019   Encounter for well adult exam with abnormal findings 12/10/2011   Essential hypertension 07/11/2007   Qualifier: Diagnosis of  By: Maris Berger    Former smoker 02/08/2015   GERD 07/11/2007   GERD with esophagitis 04/10/2023   Heart failure with reduced ejection fraction (HCC) 04/10/2023   Hematemesis due to hiatal  hernia/esophagitis 04/10/2023   Hiatal hernia 04/11/2023   Hypercalcemia 08/12/2015   Hyperlipidemia    Hyperthyroidism    Hypervitaminosis D 01/14/2019   Hypokalemia 04/10/2023   Hypothyroidism    Hypoxia    Lacunar infarction (HCC) 03/31/2013   Microalbuminuria 08/31/2019   Neck pain 03/26/2022   Neural foraminal stenosis of cervical spine 06/11/2022   Odynophagia 04/10/2023   OSTEOARTHRITIS, KNEE, RIGHT 10/27/2008   PERIPHERAL EDEMA 05/31/2009   Polyarthralgia 09/12/2017   Primary osteoarthritis of right shoulder 06/29/2022   PULMONARY NODULE 06/24/2008   granuloma   Recurrent falls 08/17/2016   Right shoulder pain 08/17/2016   Rotator cuff arthropathy 09/07/2016   Injected 09/07/2016 Repeat injection 01/07/17 Repeat injection April 20th 2018. Repeat injection 05/22/2017 Repeat injection 08/15/2017   Skin lesion 02/19/2017   Spinal stenosis in cervical region 03/31/2013   Spondylosis, cervical, with myelopathy 11/11/2012   TIA (transient ischemic attack) 08/13/2015   Upper back pain on right side 08/31/2019   Vitamin D deficiency 08/12/2015    Past Surgical History:  Procedure Laterality Date   ANTERIOR CERVICAL DECOMP/DISCECTOMY FUSION N/A 01/06/2013   Procedure: ANTERIOR CERVICAL DECOMPRESSION/DISCECTOMY FUSION 1 LEVEL;  Surgeon: Maeola Harman, MD;  Location: [facility];  Service: Neurosurgery;  Laterality: N/A;  Cervical three-four Anterior cervical decompression/diskectomy/fusion   BACK SURGERY  1986   x 2  1986 C 3   ESOPHAGOGASTRODUODENOSCOPY (EGD) WITH PROPOFOL N/A 04/11/2023   Procedure: ESOPHAGOGASTRODUODENOSCOPY (EGD) WITH PROPOFOL;  Surgeon: Sherrilyn Rist, MD;  Location: [facility];  Service: Gastroenterology;  Laterality: N/A;   HERNIA REPAIR  sept 1986   left knee replacement  Nov 21, 2007   NECK SURGERY     NISSEN FUNDOPLICATION  2000   right heel bone spur  1987   TOTAL KNEE ARTHROPLASTY  01/09/2012   Procedure: TOTAL KNEE ARTHROPLASTY;  Surgeon:  Jacki Cones, MD;  Location: [facility];  Service: Orthopedics;  Laterality: Right;    Current Medications: Current Meds  Medication Sig   acetaminophen (TYLENOL) 325 MG tablet Take 2 tablets (650 mg total) by mouth every 6 (six) hours as needed for moderate pain, fever or headache.   albuterol (VENTOLIN HFA) 108 (90 Base) MCG/ACT inhaler Inhale 1-2 puffs into the lungs every 6 (six) hours as needed for wheezing or shortness of breath.   atorvastatin (LIPITOR) 80 MG tablet Take 1 tablet (80 mg total) by mouth daily.   famotidine (PEPCID) 40 MG tablet Take 40 mg by mouth daily.   gabapentin (NEURONTIN) 400 MG capsule TAKE 1 CAPSULE BY MOUTH 3  TIMES DAILY   labetalol (NORMODYNE) 300 MG tablet TAKE 1 TABLET BY MOUTH  TWICE DAILY   levothyroxine (SYNTHROID) 100 MCG tablet Take 100 mcg by mouth daily before breakfast.   montelukast (SINGULAIR) 10 MG tablet Take 10 mg by mouth daily.   MYRBETRIQ 25 MG TB24 tablet Take 25 mg by mouth daily.   pantoprazole (PROTONIX) 40 MG tablet Take 1 tablet (40 mg total) by mouth 2 (two) times daily before a meal.   polyethylene glycol (MIRALAX / GLYCOLAX) 17 g packet Take 17 g by mouth daily.  sacubitril-valsartan (ENTRESTO) 97-103 MG Take 1 tablet by mouth 2 (two) times daily.   spironolactone (ALDACTONE) 25 MG tablet Take 0.5 tablets (12.5 mg total) by mouth daily.   traZODone (DESYREL) 50 MG tablet Take 50 mg by mouth at bedtime.   TRELEGY ELLIPTA 100-62.5-25 MCG/ACT AEPB Take 1 puff by mouth daily.     Allergies:   Latex, Codeine, Zolpidem tartrate, Hct [hydrochlorothiazide], Metformin and related, Naproxen, Prednisone, Adhesive [tape], and Penicillins   Social History   Socioeconomic History   Marital status: Married    Spouse name: Eber Jones   Number of children: 2   Years of education: HS   Highest education level: Not on file  Occupational History   Occupation: Retired  Tobacco Use   Smoking status: Former    Packs/day: 3.00    Years: 30.00     Additional pack years: 0.00    Total pack years: 90.00    Types: Cigarettes    Quit date: 11/26/1982    Years since quitting: 40.5   Smokeless tobacco: Never  Substance and Sexual Activity   Alcohol use: No   Drug use: No   Sexual activity: Not on file  Other Topics Concern   Not on file  Social History Narrative   Patient lives at home with spouse.   Caffiene Use: 4 cups daily   Social Determinants of Health   Financial Resource Strain: Not on file  Food Insecurity: No Food Insecurity (04/16/2023)   Hunger Vital Sign    Worried About Running Out of Food in the Last Year: Never true    Ran Out of Food in the Last Year: Never true  Transportation Needs: No Transportation Needs (04/16/2023)   PRAPARE - Administrator, Civil Service (Medical): No    Lack of Transportation (Non-Medical): No  Physical Activity: Not on file  Stress: Not on file  Social Connections: Not on file     Family History: The patient's family history includes Emphysema in his father; Heart attack in his father and mother; Heart disease in an other family member. There is no history of Diabetes, Thyroid disease, or Hypercalcemia.  ROS:   Please see the history of present illness.    All other systems reviewed and are negative.  EKGs/Labs/Other Studies Reviewed:    The following studies were reviewed today: I discussed my findings with the patient at length   Recent Labs: 04/11/2023: Magnesium 1.8 04/15/2023: ALT 16 04/17/2023: BUN 11; Creatinine, Ser 0.89; Hemoglobin 9.3; Platelets 456; Potassium 3.6; Sodium 137; TSH 6.842  Recent Lipid Panel    Component Value Date/Time   CHOL 147 07/23/2022 0825   TRIG 113 07/23/2022 0825   HDL 40 07/23/2022 0825   CHOLHDL 3.7 07/23/2022 0825   CHOLHDL 3 02/22/2020 1301   VLDL 34.6 02/22/2020 1301   LDLCALC 86 07/23/2022 0825   LDLDIRECT 84.0 08/25/2018 0848    Physical Exam:    VS:  BP (!) 140/80 (BP Location: Left Arm, Patient Position:  Sitting, Cuff Size: Normal)   Pulse 80   Resp (!) 24   Ht 5' 11.6" (1.819 m)   Wt 183 lb (83 kg)   SpO2 91% Comment: with exertion  BMI 25.10 kg/m     Wt Readings from Last 3 Encounters:  05/27/23 183 lb (83 kg)  05/14/23 176 lb 12.8 oz (80.2 kg)  04/16/23 163 lb 9.3 oz (74.2 kg)     GEN: Patient is in no acute distress HEENT: Normal NECK: No  JVD; No carotid bruits LYMPHATICS: No lymphadenopathy CARDIAC: Hear sounds regular, 2/6 systolic murmur at the apex. RESPIRATORY:  Clear to auscultation without rales, wheezing or rhonchi  ABDOMEN: Soft, non-tender, non-distended MUSCULOSKELETAL:  No edema; No deformity  SKIN: Warm and dry NEUROLOGIC:  Alert and oriented x 3 PSYCHIATRIC:  Normal affect   Signed, Garwin Brothers, MD  05/27/2023 1:58 PM    [facility]

## 2023-05-27 NOTE — Telephone Encounter (Signed)
Got patient in to see Dr. Tomie China this afternoon at 1:20 pm

## 2023-05-28 LAB — BASIC METABOLIC PANEL
BUN/Creatinine Ratio: 21 (ref 10–24)
BUN: 17 mg/dL (ref 8–27)
CO2: 22 mmol/L (ref 20–29)
Calcium: 9.2 mg/dL (ref 8.6–10.2)
Chloride: 108 mmol/L — ABNORMAL HIGH (ref 96–106)
Creatinine, Ser: 0.8 mg/dL (ref 0.76–1.27)
Glucose: 120 mg/dL — ABNORMAL HIGH (ref 70–99)
Potassium: 4.7 mmol/L (ref 3.5–5.2)
Sodium: 142 mmol/L (ref 134–144)
eGFR: 90 mL/min/{1.73_m2} (ref >59)

## 2023-05-31 ENCOUNTER — Other Ambulatory Visit (HOSPITAL_COMMUNITY): Payer: Self-pay

## 2023-06-03 DIAGNOSIS — M5412 Radiculopathy, cervical region: Secondary | ICD-10-CM | POA: Diagnosis not present

## 2023-06-03 DIAGNOSIS — M4722 Other spondylosis with radiculopathy, cervical region: Secondary | ICD-10-CM | POA: Diagnosis not present

## 2023-06-05 ENCOUNTER — Ambulatory Visit: Payer: PPO | Attending: Cardiology

## 2023-06-05 VITALS — BP 124/68 | HR 62 | Ht 71.0 in | Wt 175.0 lb

## 2023-06-05 DIAGNOSIS — I10 Essential (primary) hypertension: Secondary | ICD-10-CM | POA: Diagnosis not present

## 2023-06-05 NOTE — Progress Notes (Signed)
Patient ID: [name], male   DOB: Sep 24, 1946, 77 y.o.   MRN: [id number]   Nurse Visit   Date of Encounter: 06/05/2023 ID: [name], DOB Oct 11, 1946, MRN [id number]  PCP:  Eunice Blase, PA-C   [facility] Providers Cardiologist:  None      Visit Details   VS:  BP 124/68 (BP Location: Right Arm, Patient Position: Sitting)   Pulse 62   Ht 5\' 11"  (1.803 m)   Wt 175 lb (79.4 kg)   SpO2 96%   BMI 24.41 kg/m  , BMI Body mass index is 24.41 kg/m.  Wt Readings from Last 3 Encounters:  06/05/23 175 lb (79.4 kg)  05/27/23 183 lb (83 kg)  05/14/23 176 lb 12.8 oz (80.2 kg)     Reason for visit: Per Dr. Tomie China- BP log, Wt, BP, P, BMP, Check swelling Performed today: Vitals, BMP, BP log and vitals reviewed by Dr. Tomie China Changes (medications, testing, etc.) : Start Entresto 24-26 1 Bid, Nurse visit in 1 week for BP, Pulse, Weight, BMP, Check swelling, BP log. Length of Visit: 15 minutes    Medications Adjustments/Labs and Tests Ordered: Orders Placed This Encounter  Procedures   Basic metabolic panel   No orders of the defined types were placed in this encounter.    Signed, Neena Rhymes, RN  06/05/2023 8:38 AM

## 2023-06-06 DIAGNOSIS — R12 Heartburn: Secondary | ICD-10-CM | POA: Diagnosis not present

## 2023-06-06 DIAGNOSIS — R9389 Abnormal findings on diagnostic imaging of other specified body structures: Secondary | ICD-10-CM | POA: Diagnosis not present

## 2023-06-06 DIAGNOSIS — K2970 Gastritis, unspecified, without bleeding: Secondary | ICD-10-CM | POA: Diagnosis not present

## 2023-06-06 LAB — BASIC METABOLIC PANEL
BUN/Creatinine Ratio: 16 (ref 10–24)
BUN: 15 mg/dL (ref 8–27)
CO2: 22 mmol/L (ref 20–29)
Calcium: 9.3 mg/dL (ref 8.6–10.2)
Chloride: 107 mmol/L — ABNORMAL HIGH (ref 96–106)
Creatinine, Ser: 0.92 mg/dL (ref 0.76–1.27)
Glucose: 136 mg/dL — ABNORMAL HIGH (ref 70–99)
Potassium: 4.1 mmol/L (ref 3.5–5.2)
Sodium: 144 mmol/L (ref 134–144)
eGFR: 85 mL/min/{1.73_m2} (ref >59)

## 2023-06-12 ENCOUNTER — Other Ambulatory Visit: Payer: Self-pay | Admitting: Cardiology

## 2023-06-12 ENCOUNTER — Ambulatory Visit: Payer: PPO | Attending: Cardiology

## 2023-06-12 VITALS — BP 140/78 | HR 64 | Wt 171.8 lb

## 2023-06-12 DIAGNOSIS — I10 Essential (primary) hypertension: Secondary | ICD-10-CM | POA: Diagnosis not present

## 2023-06-12 NOTE — Progress Notes (Signed)
   Nurse Visit   Date of Encounter: 06/12/2023 ID: [name], DOB Aug 02, 1946, MRN [id number]  PCP:  Eunice Blase, PA-C   [facility] Providers Cardiologist:  Belva Crome, MD Click to update primary MD,subspecialty MD or APP then REFRESH:1}     Visit Details   VS:  BP (!) 140/78 (BP Location: Left Arm, Patient Position: Sitting, Cuff Size: Normal)   Pulse 64   Wt 171 lb 12.8 oz (77.9 kg)   BMI 23.96 kg/m  , BMI Body mass index is 23.96 kg/m.  Wt Readings from Last 3 Encounters:  06/12/23 171 lb 12.8 oz (77.9 kg)  06/05/23 175 lb (79.4 kg)  05/27/23 183 lb (83 kg)     Reason for visit: BP/Pulse/ Weight check and lab draw Performed today: Vitals, Education and Provider consulted Changes (medications, testing, etc.) : No new orders Length of Visit: 25 minutes    Medications Adjustments/Labs and Tests Ordered: Orders Placed This Encounter  Procedures   Basic Metabolic Panel (BMET)   No orders of the defined types were placed in this encounter.    Signed, Samson Frederic, RN  06/12/2023 10:44 AM

## 2023-06-13 LAB — BASIC METABOLIC PANEL
BUN/Creatinine Ratio: 20 (ref 10–24)
BUN: 17 mg/dL (ref 8–27)
CO2: 25 mmol/L (ref 20–29)
Calcium: 9.5 mg/dL (ref 8.6–10.2)
Chloride: 104 mmol/L (ref 96–106)
Creatinine, Ser: 0.84 mg/dL (ref 0.76–1.27)
Glucose: 120 mg/dL — ABNORMAL HIGH (ref 70–99)
Potassium: 4.4 mmol/L (ref 3.5–5.2)
Sodium: 141 mmol/L (ref 134–144)
eGFR: 89 mL/min/{1.73_m2} (ref >59)

## 2023-06-17 DIAGNOSIS — M5412 Radiculopathy, cervical region: Secondary | ICD-10-CM | POA: Diagnosis not present

## 2023-06-21 ENCOUNTER — Telehealth: Payer: Self-pay | Admitting: Cardiology

## 2023-06-21 MED ORDER — ENTRESTO 97-103 MG PO TABS: 1.0000 | ORAL_TABLET | Freq: Two times a day (BID) | ORAL | 3 refills | Status: DC

## 2023-06-21 NOTE — Telephone Encounter (Signed)
*  STAT* If patient is at the pharmacy, call can be transferred to refill team.   1. Which medications need to be refilled? (please list name of each medication and dose if known) Entresto   2. Would you like to learn more about the convenience, safety, & potential cost savings by using the [facility]?      3. Are you open to using the [facility] ([facility].    4. Which pharmacy/location (including street and city if local pharmacy) is medication to be sent to? [facility],   5. Do they need a 30 day or 90 day supply? 90 days  # 180 and refills

## 2023-07-02 DIAGNOSIS — R5383 Other fatigue: Secondary | ICD-10-CM | POA: Diagnosis not present

## 2023-07-02 DIAGNOSIS — J449 Chronic obstructive pulmonary disease, unspecified: Secondary | ICD-10-CM | POA: Diagnosis not present

## 2023-07-02 DIAGNOSIS — R911 Solitary pulmonary nodule: Secondary | ICD-10-CM | POA: Diagnosis not present

## 2023-07-02 DIAGNOSIS — R0600 Dyspnea, unspecified: Secondary | ICD-10-CM | POA: Diagnosis not present

## 2023-07-02 DIAGNOSIS — G2581 Restless legs syndrome: Secondary | ICD-10-CM | POA: Diagnosis not present

## 2023-07-02 DIAGNOSIS — I509 Heart failure, unspecified: Secondary | ICD-10-CM | POA: Diagnosis not present

## 2023-07-02 DIAGNOSIS — J301 Allergic rhinitis due to pollen: Secondary | ICD-10-CM | POA: Diagnosis not present

## 2023-07-03 DIAGNOSIS — G4733 Obstructive sleep apnea (adult) (pediatric): Secondary | ICD-10-CM | POA: Diagnosis not present

## 2023-07-04 ENCOUNTER — Telehealth: Payer: Self-pay | Admitting: Cardiology

## 2023-07-04 DIAGNOSIS — R0602 Shortness of breath: Secondary | ICD-10-CM | POA: Diagnosis not present

## 2023-07-04 DIAGNOSIS — Z20822 Contact with and (suspected) exposure to covid-19: Secondary | ICD-10-CM | POA: Diagnosis not present

## 2023-07-04 DIAGNOSIS — R9431 Abnormal electrocardiogram [ECG] [EKG]: Secondary | ICD-10-CM | POA: Diagnosis not present

## 2023-07-04 DIAGNOSIS — I517 Cardiomegaly: Secondary | ICD-10-CM | POA: Diagnosis not present

## 2023-07-04 DIAGNOSIS — I509 Heart failure, unspecified: Secondary | ICD-10-CM | POA: Diagnosis not present

## 2023-07-04 DIAGNOSIS — R918 Other nonspecific abnormal finding of lung field: Secondary | ICD-10-CM | POA: Diagnosis not present

## 2023-07-04 DIAGNOSIS — Z79899 Other long term (current) drug therapy: Secondary | ICD-10-CM | POA: Diagnosis not present

## 2023-07-04 DIAGNOSIS — Z91148 Patient's other noncompliance with medication regimen for other reason: Secondary | ICD-10-CM | POA: Diagnosis not present

## 2023-07-04 DIAGNOSIS — Z91199 Patient's noncompliance with other medical treatment and regimen due to unspecified reason: Secondary | ICD-10-CM | POA: Diagnosis not present

## 2023-07-04 MED ORDER — ENTRESTO 97-103 MG PO TABS: 1.0000 | ORAL_TABLET | Freq: Two times a day (BID) | ORAL | 3 refills | Status: DC

## 2023-07-04 NOTE — Telephone Encounter (Signed)
Sent prescription to [facility], [place] per request.  Left voice mail letting the patient know it had been sent and that we have no available samples of Entreso in the office.

## 2023-07-04 NOTE — Telephone Encounter (Signed)
Pt c/o swelling/edema: STAT if pt has developed SOB within 24 hours  If swelling, where is the swelling located?   Feet, stomach  How much weight have you gained and in what time span?   Yes.  171.6 lbs to 181.2 overnight  Have you gained 2 pounds in a day or 5 pounds in a week? Yes  Do you have a log of your daily weights (if so, list)?   Yes  Are you currently taking a fluid pill?   Yes  Are you currently SOB?   Yes  Have you traveled recently in a car or plane for an extended period of time?   Wife stated patient had about 10 lbs weight gain overnight.

## 2023-07-04 NOTE — Addendum Note (Signed)
Addended by: , Elmarie Shiley L on: 07/04/2023 04:50 PM   Modules accepted: Orders

## 2023-07-04 NOTE — Telephone Encounter (Signed)
*  STAT* If patient is at the pharmacy, call can be transferred to refill team.   1. Which medications need to be refilled? (please list name of each medication and dose if known)   sacubitril-valsartan (ENTRESTO) 97-103 MG     2. Would you like to learn more about the convenience, safety, & potential cost savings by using the [facility]? No   3. Are you open to using the [facility] ([facility]No   4. Which pharmacy/location (including street and city if local pharmacy) is medication to be sent to?[facility] - [place], [place] - [address]    5. Do they need a 30 day or 90 day supply? 90 day   Pt is out of medication, prior refill request was sent to incorrect  pharmacy  Patient calling the office for samples of medication:   1.  What medication and dosage are you requesting samples for?   sacubitril-valsartan (ENTRESTO) 97-103 MG    2.  Are you currently out of this medication? Yes

## 2023-07-04 NOTE — Telephone Encounter (Signed)
[name] states that the pt has increased swelling into abd and shortness of breath. Advised to call 911 and go to the ED for evaluation. [name] agreed with plan, verbalized understanding and had no additional questions.

## 2023-07-10 ENCOUNTER — Encounter: Payer: Self-pay | Admitting: Gastroenterology

## 2023-07-10 ENCOUNTER — Telehealth: Payer: Self-pay

## 2023-07-10 ENCOUNTER — Ambulatory Visit: Payer: PPO | Admitting: Gastroenterology

## 2023-07-10 ENCOUNTER — Encounter: Payer: Self-pay | Admitting: Cardiology

## 2023-07-10 ENCOUNTER — Ambulatory Visit: Payer: PPO | Attending: Cardiology | Admitting: Cardiology

## 2023-07-10 VITALS — BP 130/60 | HR 61 | Wt 176.5 lb

## 2023-07-10 VITALS — BP 138/76 | HR 60 | Ht 71.6 in | Wt 178.0 lb

## 2023-07-10 DIAGNOSIS — R1319 Other dysphagia: Secondary | ICD-10-CM | POA: Diagnosis not present

## 2023-07-10 DIAGNOSIS — K5909 Other constipation: Secondary | ICD-10-CM | POA: Diagnosis not present

## 2023-07-10 DIAGNOSIS — I2510 Atherosclerotic heart disease of native coronary artery without angina pectoris: Secondary | ICD-10-CM | POA: Diagnosis not present

## 2023-07-10 DIAGNOSIS — I10 Essential (primary) hypertension: Secondary | ICD-10-CM

## 2023-07-10 DIAGNOSIS — I429 Cardiomyopathy, unspecified: Secondary | ICD-10-CM

## 2023-07-10 DIAGNOSIS — E782 Mixed hyperlipidemia: Secondary | ICD-10-CM | POA: Diagnosis not present

## 2023-07-10 DIAGNOSIS — K2100 Gastro-esophageal reflux disease with esophagitis, without bleeding: Secondary | ICD-10-CM

## 2023-07-10 DIAGNOSIS — K449 Diaphragmatic hernia without obstruction or gangrene: Secondary | ICD-10-CM

## 2023-07-10 NOTE — Progress Notes (Signed)
Cardiology Office Note:    Date:  07/10/2023   ID:  [name], DOB January 04, 1946, MRN [id number]  PCP:  Steven Blase, PA-C  Cardiologist:  Steven Brothers, MD   Referring MD: Steven Blase, PA-C    ASSESSMENT:    1. Cardiomyopathy, unspecified type (HCC)   2. Coronary artery disease involving native coronary artery of native heart without angina pectoris   3. Essential hypertension   4. Mixed hyperlipidemia    PLAN:    In order of problems listed above:  Coronary artery disease: Secondary prevention stressed to the patient.  Importance of compliance with diet medication stressed any vocalized understanding. Congestive heart failure: Reduced ejection fraction: Will do an echocardiogram to assess this.  I will do a Chem-7 and a BNP today.  If his ejection fraction is low I could refer him to our electrophysiology colleagues for evaluation for possible defibrillator and also CRT as needed.  He has intraventricular conduction delay. Essential hypertension: Blood pressure stable and diet was emphasized Mixed dyslipidemia: On lipid-lowering medications.  Lipids reviewed. [facility] hospital records reviewed extensively and discussed with the patient at length and his wife had questions which were answered to their satisfaction.  Salt intake issues and daily weight checks were recommended and they are meticulous about this. Patient will be seen in follow-up appointment in 4 weeks or earlier if the patient has any concerns.    Medication Adjustments/Labs and Tests Ordered: Current medicines are reviewed at length with the patient today.  Concerns regarding medicines are outlined above.  No orders of the defined types were placed in this encounter.  No orders of the defined types were placed in this encounter.    No chief complaint on file.    History of Present Illness:    [name] is a 77 y.o. male.  Patient has past medical history of essential hypertension,  dyslipidemia, coronary artery disease and cardiomyopathy.  He recently went to the hospital with increased weight gain and was given diuretics and discharged.  Subsequently is done fine.  No chest pain orthopnea or PND.  At the time of my evaluation, the patient is alert awake oriented and in no distress.  Past Medical History:  Diagnosis Date   Abnormal loss of weight 08/31/2019   Abnormality of gait 03/31/2013   Acute blood loss anemia 04/11/2023   ALLERGIC RHINITIS 11/06/2007   Qualifier: Diagnosis of   By: Jonny Ruiz MD, Len Blalock     Replacing diagnoses that were inactivated after the 02/25/23 regulatory import   Anemia, unspecified 08/22/2012   BENIGN PROSTATIC HYPERTROPHY 07/14/2007   CAD (coronary artery disease) 08/01/2022   Cardiomyopathy (HCC) 06/11/2022   Cellulitis of left hand 04/16/2023   Cerebrovascular disease 04/11/2023   Cervical spondylosis with radiculopathy 06/11/2022   Chronic osteomyelitis of toe of left foot (HCC) 09/12/2017   Chronic right shoulder pain 06/29/2022   COLONIC POLYPS, HX OF 07/14/2007   Complication of anesthesia    pt states he needs to be cathed after surgery   Controlled type 2 diabetes mellitus without complication, without long-term current use of insulin (HCC) 08/22/2012   COPD (chronic obstructive pulmonary disease) (HCC) 04/11/2023   COVID-19    Degenerative arthritis of right knee 12/12/2011   Diabetes (HCC) 02/03/2013   Diet controlled   Diabetes mellitus due to underlying condition with unspecified complications (HCC) 06/11/2022   Disequilibrium 03/31/2013   Dyspnea 08/31/2019   Encounter for well adult exam with abnormal findings 12/10/2011  Essential hypertension 07/11/2007   Qualifier: Diagnosis of  By: Maris Berger    Former smoker 02/08/2015   GERD 07/11/2007   GERD with esophagitis 04/10/2023   Heart failure with reduced ejection fraction (HCC) 04/10/2023   Hematemesis due to hiatal hernia/esophagitis 04/10/2023   Hiatal  hernia 04/11/2023   Hypercalcemia 08/12/2015   Hyperlipidemia    Hyperthyroidism    Hypervitaminosis D 01/14/2019   Hypokalemia 04/10/2023   Hypothyroidism    Hypoxia    Lacunar infarction (HCC) 03/31/2013   Microalbuminuria 08/31/2019   Neck pain 03/26/2022   Neural foraminal stenosis of cervical spine 06/11/2022   Odynophagia 04/10/2023   OSTEOARTHRITIS, KNEE, RIGHT 10/27/2008   PERIPHERAL EDEMA 05/31/2009   Polyarthralgia 09/12/2017   Primary osteoarthritis of right shoulder 06/29/2022   PULMONARY NODULE 06/24/2008   granuloma   Recurrent falls 08/17/2016   Right shoulder pain 08/17/2016   Rotator cuff arthropathy 09/07/2016   Injected 09/07/2016 Repeat injection 01/07/17 Repeat injection April 20th 2018. Repeat injection 05/22/2017 Repeat injection 08/15/2017   Skin lesion 02/19/2017   Spinal stenosis in cervical region 03/31/2013   Spondylosis, cervical, with myelopathy 11/11/2012   TIA (transient ischemic attack) 08/13/2015   Upper back pain on right side 08/31/2019   Vitamin D deficiency 08/12/2015    Past Surgical History:  Procedure Laterality Date   ANTERIOR CERVICAL DECOMP/DISCECTOMY FUSION N/A 01/06/2013   Procedure: ANTERIOR CERVICAL DECOMPRESSION/DISCECTOMY FUSION 1 LEVEL;  Surgeon: Maeola Harman, MD;  Location: [facility];  Service: Neurosurgery;  Laterality: N/A;  Cervical three-four Anterior cervical decompression/diskectomy/fusion   BACK SURGERY  1986   x 2  1986 C 3   ESOPHAGOGASTRODUODENOSCOPY (EGD) WITH PROPOFOL N/A 04/11/2023   Procedure: ESOPHAGOGASTRODUODENOSCOPY (EGD) WITH PROPOFOL;  Surgeon: Sherrilyn Rist, MD;  Location: [facility];  Service: Gastroenterology;  Laterality: N/A;   HERNIA REPAIR  sept 1986   left knee replacement  Nov 21, 2007   NECK SURGERY     NISSEN FUNDOPLICATION  2000   right heel bone spur  1987   TOTAL KNEE ARTHROPLASTY  01/09/2012   Procedure: TOTAL KNEE ARTHROPLASTY;  Surgeon: Jacki Cones, MD;  Location: [facility] ORS;   Service: Orthopedics;  Laterality: Right;    Current Medications: Current Meds  Medication Sig   acetaminophen (TYLENOL) 325 MG tablet Take 2 tablets (650 mg total) by mouth every 6 (six) hours as needed for moderate pain, fever or headache.   albuterol (VENTOLIN HFA) 108 (90 Base) MCG/ACT inhaler Inhale 1-2 puffs into the lungs every 6 (six) hours as needed for wheezing or shortness of breath.   atorvastatin (LIPITOR) 80 MG tablet Take 1 tablet (80 mg total) by mouth daily.   furosemide (LASIX) 40 MG tablet Take 1 tablet (40 mg total) by mouth daily.   gabapentin (NEURONTIN) 400 MG capsule TAKE 1 CAPSULE BY MOUTH 3  TIMES DAILY   labetalol (NORMODYNE) 300 MG tablet TAKE 1 TABLET BY MOUTH  TWICE DAILY   levothyroxine (SYNTHROID) 100 MCG tablet Take 100 mcg by mouth daily before breakfast.   montelukast (SINGULAIR) 10 MG tablet Take 10 mg by mouth daily.   MYRBETRIQ 25 MG TB24 tablet Take 25 mg by mouth daily.   pantoprazole (PROTONIX) 40 MG tablet Take 1 tablet (40 mg total) by mouth 2 (two) times daily before a meal.   sacubitril-valsartan (ENTRESTO) 97-103 MG Take 1 tablet by mouth 2 (two) times daily.   spironolactone (ALDACTONE) 25 MG tablet Take 0.5 tablets (12.5 mg total) by mouth  daily.   traZODone (DESYREL) 50 MG tablet Take 50 mg by mouth at bedtime.   TRELEGY ELLIPTA 100-62.5-25 MCG/ACT AEPB Take 1 puff by mouth daily.     Allergies:   Latex, Codeine, Zolpidem tartrate, Hct [hydrochlorothiazide], Metformin and related, Naproxen, Prednisone, Adhesive [tape], and Penicillins   Social History   Socioeconomic History   Marital status: Married    Spouse name: Eber Jones   Number of children: 2   Years of education: HS   Highest education level: Not on file  Occupational History   Occupation: Retired  Tobacco Use   Smoking status: Former    Current packs/day: 0.00    Average packs/day: 3.0 packs/day for 30.0 years (90.0 ttl pk-yrs)    Types: Cigarettes    Start date: 11/26/1952     Quit date: 11/26/1982    Years since quitting: 40.6   Smokeless tobacco: Never  Substance and Sexual Activity   Alcohol use: No   Drug use: No   Sexual activity: Not on file  Other Topics Concern   Not on file  Social History Narrative   Patient lives at home with spouse.   Caffiene Use: 4 cups daily   Social Determinants of Health   Financial Resource Strain: Low Risk  (01/18/2023)   Received from [facility]   Overall Financial Resource Strain (CARDIA)    Difficulty of Paying Living Expenses: Not hard at all  Food Insecurity: No Food Insecurity (04/16/2023)   Hunger Vital Sign    Worried About Running Out of Food in the Last Year: Never true    Ran Out of Food in the Last Year: Never true  Transportation Needs: No Transportation Needs (04/16/2023)   PRAPARE - Administrator, Civil Service (Medical): No    Lack of Transportation (Non-Medical): No  Physical Activity: Not on file  Stress: Not on file  Social Connections: Unknown (03/26/2022)   Received from [facility]   Social Network    Social Network: Not on file     Family History: The patient's family history includes Emphysema in his father; Heart attack in his father and mother; Heart disease in an other family member. There is no history of Diabetes, Thyroid disease, or Hypercalcemia.  ROS:   Please see the history of present illness.    All other systems reviewed and are negative.  EKGs/Labs/Other Studies Reviewed:    The following studies were reviewed today: I discussed my findings with the patient at length   Recent Labs: 04/11/2023: Magnesium 1.8 04/15/2023: ALT 16 04/17/2023: Hemoglobin 9.3; Platelets 456; TSH 6.842 06/12/2023: BUN 17; Creatinine, Ser 0.84; Potassium 4.4; Sodium 141  Recent Lipid Panel    Component Value Date/Time   CHOL 147 07/23/2022 0825   TRIG 113 07/23/2022 0825   HDL 40 07/23/2022 0825   CHOLHDL 3.7 07/23/2022 0825   CHOLHDL 3  02/22/2020 1301   VLDL 34.6 02/22/2020 1301   LDLCALC 86 07/23/2022 0825   LDLDIRECT 84.0 08/25/2018 0848    Physical Exam:    VS:  BP 138/76   Pulse 60   Ht 5' 11.6" (1.819 m)   Wt 178 lb (80.7 kg)   SpO2 96%   BMI 24.41 kg/m     Wt Readings from Last 3 Encounters:  07/10/23 178 lb (80.7 kg)  07/10/23 176 lb 8 oz (80.1 kg)  06/12/23 171 lb 12.8 oz (77.9 kg)     GEN: Patient is in no acute distress HEENT:  Normal NECK: No JVD; No carotid bruits LYMPHATICS: No lymphadenopathy CARDIAC: Hear sounds regular, 2/6 systolic murmur at the apex. RESPIRATORY:  Clear to auscultation without rales, wheezing or rhonchi  ABDOMEN: Soft, non-tender, non-distended MUSCULOSKELETAL:  No edema; No deformity  SKIN: Warm and dry NEUROLOGIC:  Alert and oriented x 3 PSYCHIATRIC:  Normal affect   Signed, Steven Brothers, MD  07/10/2023 3:05 PM    [facility]

## 2023-07-10 NOTE — Patient Instructions (Signed)
Medication Instructions:  Your physician recommends that you continue on your current medications as directed. Please refer to the Current Medication list given to you today.  *If you need a refill on your cardiac medications before your next appointment, please call your pharmacy*   Lab Work: Your physician recommends that you return for lab work in:   Labs today: BMP, Pro BNP, Vitamin B12  If you have labs (blood work) drawn today and your tests are completely normal, you will receive your results only by: MyChart Message (if you have MyChart) OR A paper copy in the mail If you have any lab test that is abnormal or we need to change your treatment, we will call you to review the results.   Testing/Procedures: Your physician has requested that you have an echocardiogram. Echocardiography is a painless test that uses sound waves to create images of your heart. It provides your doctor with information about the size and shape of your heart and how well your heart's chambers and valves are working. This procedure takes approximately one hour. There are no restrictions for this procedure. Please do NOT wear cologne, perfume, aftershave, or lotions (deodorant is allowed). Please arrive 15 minutes prior to your appointment time.    Follow-Up: At [facility], you and your health needs are our priority.  As part of our continuing mission to provide you with exceptional heart care, we have created designated Provider Care Teams.  These Care Teams include your primary Cardiologist (physician) and Advanced Practice Providers (APPs -  Physician Assistants and Nurse Practitioners) who all work together to provide you with the care you need, when you need it.  We recommend signing up for the patient portal called "MyChart".  Sign up information is provided on this After Visit Summary.  MyChart is used to connect with patients for Virtual Visits (Telemedicine).  Patients are able to view  lab/test results, encounter notes, upcoming appointments, etc.  Non-urgent messages can be sent to your provider as well.   To learn more about what you can do with MyChart, go to [email or web address].    Your next appointment:   6 month(s)  Provider:   Belva Crome, MD    Other Instructions None

## 2023-07-10 NOTE — Progress Notes (Deleted)
[facility] progress note:  History: [name] 07/10/2023   Reason for consult/chief complaint: No chief complaint on file.   Subjective  HPI: [name] was seen in hospital consultation May 2024, when he presented with hematemesis and about 6 months of slowly worsening dysphagia and weight loss.  He had undergone a fundoplication about 20 years prior, and was on Plavix for prior CVA/TIA.  Further clinical details in the hospital consult note. Upper endoscopy by me revealed severe ulcerative esophagitis, evidence of previous fundoplication, and hiatal hernia with 5 to 7 cm of stomach (including the fundoplication portion (above the diaphragm.  The patient had been on no acid suppression medicine other than what he was taking as needed over-the-counter, so was discharged home on pantoprazole 40 mg twice daily and remained on Plavix. ***   ROS:  Review of Systems   Past Medical History: Past Medical History:  Diagnosis Date   Abnormal loss of weight 08/31/2019   Abnormality of gait 03/31/2013   Acute blood loss anemia 04/11/2023   ALLERGIC RHINITIS 11/06/2007   Qualifier: Diagnosis of   By: [name], [name]     Replacing diagnoses that were inactivated after the 02/25/23 regulatory import   Anemia, unspecified 08/22/2012   BENIGN PROSTATIC HYPERTROPHY 07/14/2007   CAD (coronary artery disease) 08/01/2022   Cardiomyopathy (HCC) 06/11/2022   Cellulitis of left hand 04/16/2023   Cerebrovascular disease 04/11/2023   Cervical spondylosis with radiculopathy 06/11/2022   Chronic osteomyelitis of toe of left foot (HCC) 09/12/2017   Chronic right shoulder pain 06/29/2022   COLONIC POLYPS, HX OF 07/14/2007   Complication of anesthesia    pt states he needs to be cathed after surgery   Controlled type 2 diabetes mellitus without complication, without long-term current use of insulin (HCC) 08/22/2012   COPD (chronic obstructive pulmonary disease) (HCC)  04/11/2023   COVID-19    Degenerative arthritis of right knee 12/12/2011   Diabetes (HCC) 02/03/2013   Diet controlled   Diabetes mellitus due to underlying condition with unspecified complications (HCC) 06/11/2022   Disequilibrium 03/31/2013   Dyspnea 08/31/2019   Encounter for well adult exam with abnormal findings 12/10/2011   Essential hypertension 07/11/2007   Qualifier: Diagnosis of  By: [name]    Former smoker 02/08/2015   GERD 07/11/2007   GERD with esophagitis 04/10/2023   Heart failure with reduced ejection fraction (HCC) 04/10/2023   Hematemesis due to hiatal hernia/esophagitis 04/10/2023   Hiatal hernia 04/11/2023   Hypercalcemia 08/12/2015   Hyperlipidemia    Hyperthyroidism    Hypervitaminosis D 01/14/2019   Hypokalemia 04/10/2023   Hypothyroidism    Hypoxia    Lacunar infarction (HCC) 03/31/2013   Microalbuminuria 08/31/2019   Neck pain 03/26/2022   Neural foraminal stenosis of cervical spine 06/11/2022   Odynophagia 04/10/2023   OSTEOARTHRITIS, KNEE, RIGHT 10/27/2008   PERIPHERAL EDEMA 05/31/2009   Polyarthralgia 09/12/2017   Primary osteoarthritis of right shoulder 06/29/2022   PULMONARY NODULE 06/24/2008   granuloma   Recurrent falls 08/17/2016   Right shoulder pain 08/17/2016   Rotator cuff arthropathy 09/07/2016   Injected 09/07/2016 Repeat injection 01/07/17 Repeat injection April 20th 2018. Repeat injection 05/22/2017 Repeat injection 08/15/2017   Skin lesion 02/19/2017   Spinal stenosis in cervical region 03/31/2013   Spondylosis, cervical, with myelopathy 11/11/2012   TIA (transient ischemic attack) 08/13/2015   Upper back pain on right side 08/31/2019   Vitamin D deficiency 08/12/2015     Past Surgical  History: Past Surgical History:  Procedure Laterality Date   ANTERIOR CERVICAL DECOMP/DISCECTOMY FUSION N/A 01/06/2013   Procedure: ANTERIOR CERVICAL DECOMPRESSION/DISCECTOMY FUSION 1 LEVEL;  Surgeon: [name];   Location: [facility];  Service: Neurosurgery;  Laterality: N/A;  Cervical three-four Anterior cervical decompression/diskectomy/fusion   BACK SURGERY  1986   x 2  1986 C 3   ESOPHAGOGASTRODUODENOSCOPY (EGD) WITH PROPOFOL N/A 04/11/2023   Procedure: ESOPHAGOGASTRODUODENOSCOPY (EGD) WITH PROPOFOL;  Surgeon: Steven Rist, [name];  Location: [facility];  Service: Gastroenterology;  Laterality: N/A;   HERNIA REPAIR  sept 1986   left knee replacement  Nov 21, 2007   NECK SURGERY     NISSEN FUNDOPLICATION  2000   right heel bone spur  1987   TOTAL KNEE ARTHROPLASTY  01/09/2012   Procedure: TOTAL KNEE ARTHROPLASTY;  Surgeon: Jacki Cones, [name];  Location: [facility];  Service: Orthopedics;  Laterality: Right;     Family History: Family History  Problem Relation Age of Onset   Heart attack Mother    Heart attack Father    Emphysema Father    Heart disease Other    Diabetes Neg Hx    Thyroid disease Neg Hx    Hypercalcemia Neg Hx     Social History: Social History   Socioeconomic History   Marital status: Married    Spouse name: [name]   Number of children: 2   Years of education: HS   Highest education level: Not on file  Occupational History   Occupation: Retired  Tobacco Use   Smoking status: Former    Current packs/day: 0.00    Average packs/day: 3.0 packs/day for 30.0 years (90.0 ttl pk-yrs)    Types: Cigarettes    Start date: 11/26/1952    Quit date: 11/26/1982    Years since quitting: 40.6   Smokeless tobacco: Never  Substance and Sexual Activity   Alcohol use: No   Drug use: No   Sexual activity: Not on file  Other Topics Concern   Not on file  Social History Narrative   Patient lives at home with spouse.   Caffiene Use: 4 cups daily   Social Determinants of Health   Financial Resource Strain: Low Risk  (01/18/2023)   Received from [facility]   Overall Financial Resource Strain (CARDIA)    Difficulty of Paying Living Expenses: Not hard at  all  Food Insecurity: No Food Insecurity (04/16/2023)   Hunger Vital Sign    Worried About Running Out of Food in the Last Year: Never true    Ran Out of Food in the Last Year: Never true  Transportation Needs: No Transportation Needs (04/16/2023)   PRAPARE - Administrator, Civil Service (Medical): No    Lack of Transportation (Non-Medical): No  Physical Activity: Not on file  Stress: Not on file  Social Connections: Unknown (03/26/2022)   Received from [facility]   Social Network    Social Network: Not on file    Allergies: Allergies  Allergen Reactions   Latex Rash and Other (See Comments)    Skin gets "blood raw and turns into sores"  Band Aid will tear off his skin   Codeine Other (See Comments)    Severe Headaches    Zolpidem Tartrate Other (See Comments)    Hallucinations   Hct [Hydrochlorothiazide] Other (See Comments)    Hypercalcemia    Metformin And Related Diarrhea and Other (See Comments)  Cannot tolerate in higher doses- has to drop from 2,000 mg daily to 1,500 mg daily to STOP the diarrhea that was occurring   Naproxen Diarrhea   Prednisone Diarrhea   Adhesive [Tape] Rash and Other (See Comments)    Redness, also (thinks he can tolerate paper tape)   Penicillins Rash and Other (See Comments)    Welts Has patient had a PCN reaction causing immediate rash, facial/tongue/throat swelling, SOB or lightheadedness with hypotension: Yes Has patient had a PCN reaction causing severe rash involving mucus membranes or skin necrosis: No Has patient had a PCN reaction that required hospitalization: No Has patient had a PCN reaction occurring within the last 10 years: No If all of the above answers are "NO", then may proceed with Cephalosporin use.     Outpatient Meds: Current Outpatient Medications  Medication Sig Dispense Refill   acetaminophen (TYLENOL) 325 MG tablet Take 2 tablets (650 mg total) by mouth every 6 (six) hours as needed  for moderate pain, fever or headache.     albuterol (VENTOLIN HFA) 108 (90 Base) MCG/ACT inhaler Inhale 1-2 puffs into the lungs every 6 (six) hours as needed for wheezing or shortness of breath.     atorvastatin (LIPITOR) 80 MG tablet Take 1 tablet (80 mg total) by mouth daily. 30 tablet 0   famotidine (PEPCID) 40 MG tablet Take 40 mg by mouth daily.     furosemide (LASIX) 40 MG tablet Take 1 tablet (40 mg total) by mouth daily. 90 tablet 3   gabapentin (NEURONTIN) 400 MG capsule TAKE 1 CAPSULE BY MOUTH 3  TIMES DAILY 270 capsule 3   labetalol (NORMODYNE) 300 MG tablet TAKE 1 TABLET BY MOUTH  TWICE DAILY 90 tablet 0   levothyroxine (SYNTHROID) 100 MCG tablet Take 100 mcg by mouth daily before breakfast.     montelukast (SINGULAIR) 10 MG tablet Take 10 mg by mouth daily.     MYRBETRIQ 25 MG TB24 tablet Take 25 mg by mouth daily.     pantoprazole (PROTONIX) 40 MG tablet Take 1 tablet (40 mg total) by mouth 2 (two) times daily before a meal. 60 tablet 3   polyethylene glycol (MIRALAX / GLYCOLAX) 17 g packet Take 17 g by mouth daily. 14 each 0   sacubitril-valsartan (ENTRESTO) 97-103 MG Take 1 tablet by mouth 2 (two) times daily. 180 tablet 3   spironolactone (ALDACTONE) 25 MG tablet Take 0.5 tablets (12.5 mg total) by mouth daily. 45 tablet 3   traZODone (DESYREL) 50 MG tablet Take 50 mg by mouth at bedtime.     TRELEGY ELLIPTA 100-62.5-25 MCG/ACT AEPB Take 1 puff by mouth daily.     No current facility-administered medications for this visit.      ___________________________________________________________________ Objective   Exam:  There were no vitals taken for this visit. Wt Readings from Last 3 Encounters:  06/12/23 171 lb 12.8 oz (77.9 kg)  06/05/23 175 lb (79.4 kg)  05/27/23 183 lb (83 kg)    General: ***  Eyes: sclera anicteric, no redness ENT: oral mucosa moist without lesions, no cervical or supraclavicular lymphadenopathy CV: ***, no JVD, no peripheral edema Resp: clear  to auscultation bilaterally, normal RR and effort noted GI: soft, *** tenderness, with active bowel sounds. No guarding or palpable organomegaly noted. Skin; warm and dry, no rash or jaundice noted Neuro: awake, alert and oriented x 3. Normal gross motor function and fluent speech  Labs:  Last Hgb in Epic was 9.3 in hospital May 2024  Radiologic Studies:  CLINICAL DATA:  77 year old male with epigastric pain, nausea vomiting for 4 days. Right flank pain and no recent bowel movement.   EXAM: CT ABDOMEN AND PELVIS WITH CONTRAST   TECHNIQUE: Multidetector CT imaging of the abdomen and pelvis was performed using the standard protocol following bolus administration of intravenous contrast.   RADIATION DOSE REDUCTION: This exam was performed according to the departmental dose-optimization program which includes automated exposure control, adjustment of the mA and/or kV according to patient size and/or use of iterative reconstruction technique.   CONTRAST:  75mL OMNIPAQUE IOHEXOL 350 MG/ML SOLN   COMPARISON:  [facility] chest CTA 08/21/2021 and CTA abdomen and runoff 10/15/2017.   FINDINGS: Lower chest: Emphysema demonstrated on the 2022 CT with upper lobe predominance and heterogeneous peribronchial opacity at the lung bases today most resembles postinflammatory scarring, with occasional small calcified lung nodules. No pleural effusion. Heart size within normal limits. No pericardial effusion.   Hepatobiliary: Gallbladder mildly distended but does not appear inflamed and no sludge or stones are evident. No biliary ductal dilatation. Liver enhancement within normal limits.   Pancreas: Partially atrophied but no active inflammation. Mild dystrophic calcifications, no main pancreatic ductal dilatation.   Spleen: Negative.  Punctate calcified granuloma.   Adrenals/Urinary Tract: Negative adrenal glands. Nonobstructed kidneys with symmetric renal enhancement and contrast  excretion. Decompressed ureters. Unremarkable bladder.   Stomach/Bowel: Redundant sigmoid colon with mild diverticulosis, but the sigmoid is fairly decompressed. There is mild retained stool in the rectum. Upstream moderate retained stool in redundant transverse colon and colonic flexures. Terminal ileum is decompressed. No large bowel inflammation is identified and decompressed normal appendix is suspected on coronal image 77.   No dilated small bowel. No free air, free fluid, or mesenteric inflammation identified.   Chronic mild to moderate hiatal hernia. And there is circumferential wall thickening of the visible distal esophagus (series 3, image 5). Small chronic surgical clips along the anterior gastric hernia. Intra-abdominal stomach and duodenum are decompressed. There is a small 2.3 cm diverticulum at the ligament of Treitz which is chronic (series 3, image 37) with no adjacent inflammation.   Vascular/Lymphatic: Extensive Aortoiliac calcified atherosclerosis. Major arterial structures in the abdomen and pelvis remain patent. Normal caliber abdominal aorta. Portal venous system appears patent. No lymphadenopathy identified.   Reproductive: Mild prostatomegaly.  Pelvic phleboliths.   Other: No pelvis free fluid.   Musculoskeletal: Multilevel spine degeneration. No acute or suspicious osseous lesion.   IMPRESSION: 1. Distal esophageal wall thickening compatible with a degree of Esophagitis., Superimposed on moderate chronic gastric hiatal hernia.   2. But decompressed stomach. No gastro duodenal inflammation is evident. Small chronic distal duodenal diverticulum.   3. No other acute or inflammatory process identified in the abdomen or pelvis. Redundant large bowel with retained stool. Aortic Atherosclerosis (ICD10-I70.0). Chronic postinflammatory scarring suspected at both lung bases.     Electronically Signed   By: Odessa Fleming M.D.   On: 04/10/2023 05:36     Assessment: No diagnosis found.  ***  Plan:  ***  Thank you for the courtesy of this consult.  Please call me with any questions or concerns.  Charlie Pitter III  CC: Referring provider noted above

## 2023-07-10 NOTE — Patient Instructions (Signed)
_______________________________________________________  If your blood pressure at your visit was 140/90 or greater, please contact your primary care physician to follow up on this.  _______________________________________________________  If you are age 79 or older, your body mass index should be between 23-30. Your Body mass index is 24.62 kg/m. If this is out of the aforementioned range listed, please consider follow up with your Primary Care Provider.  If you are age 45 or younger, your body mass index should be between 19-25. Your Body mass index is 24.62 kg/m. If this is out of the aformentioned range listed, please consider follow up with your Primary Care Provider.   ________________________________________________________  The [facility] providers would like to encourage you to use [facility] to communicate with providers for non-urgent requests or questions.  Due to long hold times on the telephone, sending your provider a message by [facility] may be a faster and more efficient way to get a response.  Please allow 48 business hours for a response.  Please remember that this is for non-urgent requests.  _______________________________________________________  Stop the famotidine.  In a week decrease the pantoprazole from twice a day to once a day.  Miralax 1 capful as needed for constipation.   Follow up as needed.  It was a pleasure to see you today!  Thank you for trusting me with your gastrointestinal care!

## 2023-07-10 NOTE — Telephone Encounter (Signed)
While reviewing the patient instructions with him he mentioned that when he was in the hospital they had discontinued his Plavix and Aspirin. The patient asked whether he should restart his Plavix and Aspirin now that he is out of the hospital. Spoke to Dr. Tomie China regarding this question and he recommended that the patient speak to their PCP regarding this question. I informed the patient of Dr. Kem Parkinson recommendation and he was agreeable with this plan. Patient had no further questions at this time.

## 2023-07-10 NOTE — Progress Notes (Signed)
[facility] progress note:  History: ALFIO ALLSUP 07/10/2023   Reason for consult/chief complaint: Hospitalization Follow-up (States still having abd. Discomfort, taking Pepcid at at bedtime, Protonix BID helping for acid reflux)   Subjective  HPI: Mr. Sirignano was seen in hospital consultation May 2024, when he presented with hematemesis and about 6 months of slowly worsening dysphagia and weight loss.  He had undergone a fundoplication about 20 years prior, and was on Plavix for prior CVA/TIA.  Further clinical details in the hospital consult note. Upper endoscopy by me revealed severe ulcerative esophagitis, evidence of previous fundoplication, and hiatal hernia with 5 to 7 cm of stomach (including the fundoplication portion (above the diaphragm.  The patient had been on no acid suppression medicine other than what he was taking as needed over-the-counter, so was discharged home on pantoprazole 40 mg twice daily and remained on Plavix. ______________________________ He is accompanied by his wife and daughter.   Today, he complains of abdominal swelling. He would occasionally have accompanying swelling in the legs as well. He was recently in the hospital due to gaining 9 pounds over night. He was given Lasix 40 mg.  While he did have some peripheral edema at that time, he and his family feel he mainly carried that increased fluid on his abdominal cavity.  He reportedly improved quickly with some IV Lasix when he went to the [facility] ED. He also complains of chronic constipation and states he won't have a BM for about 6-7 days at times. However, this past few weeks he's been having a BM every other day.  He denies taking Miralax to help relief his symptoms, and they suspect it may not have been included with his care plan on the way to our discharge from rehab in June.  We also reviewed his recent hospitalization where he was experiencing dysphagia, weight loss, and  hematemesis. He reports having one episode of hot water regurgitation when bending down and experiencing acid reflux when having certain foods such as pizza. He reports that he is currently able to swallow food comfortably. He is currently taking Pepcid 40 mg at bedtime that he started prior to hospitalization. He's been also taking Protonix 40 mg twice daily since his hospitalization   Patient denies diarrhea, nausea, blood in stool, black stool, vomiting, or abdominal pain.  ROS:  Review of Systems  Constitutional:  Positive for unexpected weight change. Negative for appetite change and fever.  HENT:  Positive for trouble swallowing.   Respiratory:  Negative for cough and shortness of breath.   Cardiovascular:  Positive for leg swelling. Negative for chest pain.  Gastrointestinal:  Positive for abdominal distention and constipation. Negative for abdominal pain, anal bleeding, blood in stool, diarrhea, nausea, rectal pain and vomiting.       +reflux/heartburn  Genitourinary:  Negative for dysuria.  Musculoskeletal:  Negative for back pain.  Skin:  Negative for rash.  Neurological:  Negative for weakness.  All other systems reviewed and are negative.    Past Medical History: Past Medical History:  Diagnosis Date   Abnormal loss of weight 08/31/2019   Abnormality of gait 03/31/2013   Acute blood loss anemia 04/11/2023   ALLERGIC RHINITIS 11/06/2007   Qualifier: Diagnosis of   By: Jonny Ruiz MD, Len Blalock     Replacing diagnoses that were inactivated after the 02/25/23 regulatory import   Anemia, unspecified 08/22/2012   BENIGN PROSTATIC HYPERTROPHY 07/14/2007   CAD (coronary artery disease) 08/01/2022   Cardiomyopathy (HCC)  06/11/2022   Cellulitis of left hand 04/16/2023   Cerebrovascular disease 04/11/2023   Cervical spondylosis with radiculopathy 06/11/2022   Chronic osteomyelitis of toe of left foot (HCC) 09/12/2017   Chronic right shoulder pain 06/29/2022   COLONIC POLYPS, HX OF  07/14/2007   Complication of anesthesia    pt states he needs to be cathed after surgery   Controlled type 2 diabetes mellitus without complication, without long-term current use of insulin (HCC) 08/22/2012   COPD (chronic obstructive pulmonary disease) (HCC) 04/11/2023   COVID-19    Degenerative arthritis of right knee 12/12/2011   Diabetes (HCC) 02/03/2013   Diet controlled   Diabetes mellitus due to underlying condition with unspecified complications (HCC) 06/11/2022   Disequilibrium 03/31/2013   Dyspnea 08/31/2019   Encounter for well adult exam with abnormal findings 12/10/2011   Essential hypertension 07/11/2007   Qualifier: Diagnosis of  By: Maris Berger    Former smoker 02/08/2015   GERD 07/11/2007   GERD with esophagitis 04/10/2023   Heart failure with reduced ejection fraction (HCC) 04/10/2023   Hematemesis due to hiatal hernia/esophagitis 04/10/2023   Hiatal hernia 04/11/2023   Hypercalcemia 08/12/2015   Hyperlipidemia    Hyperthyroidism    Hypervitaminosis D 01/14/2019   Hypokalemia 04/10/2023   Hypothyroidism    Hypoxia    Lacunar infarction (HCC) 03/31/2013   Microalbuminuria 08/31/2019   Neck pain 03/26/2022   Neural foraminal stenosis of cervical spine 06/11/2022   Odynophagia 04/10/2023   OSTEOARTHRITIS, KNEE, RIGHT 10/27/2008   PERIPHERAL EDEMA 05/31/2009   Polyarthralgia 09/12/2017   Primary osteoarthritis of right shoulder 06/29/2022   PULMONARY NODULE 06/24/2008   granuloma   Recurrent falls 08/17/2016   Right shoulder pain 08/17/2016   Rotator cuff arthropathy 09/07/2016   Injected 09/07/2016 Repeat injection 01/07/17 Repeat injection April 20th 2018. Repeat injection 05/22/2017 Repeat injection 08/15/2017   Skin lesion 02/19/2017   Spinal stenosis in cervical region 03/31/2013   Spondylosis, cervical, with myelopathy 11/11/2012   TIA (transient ischemic attack) 08/13/2015   Upper back pain on right side 08/31/2019   Vitamin D deficiency  08/12/2015     Past Surgical History: Past Surgical History:  Procedure Laterality Date   ANTERIOR CERVICAL DECOMP/DISCECTOMY FUSION N/A 01/06/2013   Procedure: ANTERIOR CERVICAL DECOMPRESSION/DISCECTOMY FUSION 1 LEVEL;  Surgeon: Maeola Harman, MD;  Location: [facility];  Service: Neurosurgery;  Laterality: N/A;  Cervical three-four Anterior cervical decompression/diskectomy/fusion   BACK SURGERY  1986   x 2  1986 C 3   ESOPHAGOGASTRODUODENOSCOPY (EGD) WITH PROPOFOL N/A 04/11/2023   Procedure: ESOPHAGOGASTRODUODENOSCOPY (EGD) WITH PROPOFOL;  Surgeon: Sherrilyn Rist, MD;  Location: [facility] ENDOSCOPY;  Service: Gastroenterology;  Laterality: N/A;   HERNIA REPAIR  sept 1986   left knee replacement  Nov 21, 2007   NECK SURGERY     NISSEN FUNDOPLICATION  2000   right heel bone spur  1987   TOTAL KNEE ARTHROPLASTY  01/09/2012   Procedure: TOTAL KNEE ARTHROPLASTY;  Surgeon: Jacki Cones, MD;  Location: [facility] ORS;  Service: Orthopedics;  Laterality: Right;     Family History: Family History  Problem Relation Age of Onset   Heart attack Mother    Heart attack Father    Emphysema Father    Heart disease Other    Diabetes Neg Hx    Thyroid disease Neg Hx    Hypercalcemia Neg Hx     Social History: Social History   Socioeconomic History   Marital status: Married  Spouse name: Eber Jones   Number of children: 2   Years of education: HS   Highest education level: Not on file  Occupational History   Occupation: Retired  Tobacco Use   Smoking status: Former    Current packs/day: 0.00    Average packs/day: 3.0 packs/day for 30.0 years (90.0 ttl pk-yrs)    Types: Cigarettes    Start date: 11/26/1952    Quit date: 11/26/1982    Years since quitting: 40.6   Smokeless tobacco: Never  Substance and Sexual Activity   Alcohol use: No   Drug use: No   Sexual activity: Not on file  Other Topics Concern   Not on file  Social History Narrative   Patient lives at home with spouse.    Caffiene Use: 4 cups daily   Social Determinants of Health   Financial Resource Strain: Low Risk  (01/18/2023)   Received from [facility]   Overall Financial Resource Strain (CARDIA)    Difficulty of Paying Living Expenses: Not hard at all  Food Insecurity: No Food Insecurity (04/16/2023)   Hunger Vital Sign    Worried About Running Out of Food in the Last Year: Never true    Ran Out of Food in the Last Year: Never true  Transportation Needs: No Transportation Needs (04/16/2023)   PRAPARE - Administrator, Civil Service (Medical): No    Lack of Transportation (Non-Medical): No  Physical Activity: Not on file  Stress: Not on file  Social Connections: Unknown (03/26/2022)   Received from [facility]   Social Network    Social Network: Not on file    Allergies: Allergies  Allergen Reactions   Latex Rash and Other (See Comments)    Skin gets "blood raw and turns into sores"  Band Aid will tear off his skin   Codeine Other (See Comments)    Severe Headaches    Zolpidem Tartrate Other (See Comments)    Hallucinations   Hct [Hydrochlorothiazide] Other (See Comments)    Hypercalcemia    Metformin And Related Diarrhea and Other (See Comments)    Cannot tolerate in higher doses- has to drop from 2,000 mg daily to 1,500 mg daily to STOP the diarrhea that was occurring   Naproxen Diarrhea   Prednisone Diarrhea   Adhesive [Tape] Rash and Other (See Comments)    Redness, also (thinks he can tolerate paper tape)   Penicillins Rash and Other (See Comments)    Welts Has patient had a PCN reaction causing immediate rash, facial/tongue/throat swelling, SOB or lightheadedness with hypotension: Yes Has patient had a PCN reaction causing severe rash involving mucus membranes or skin necrosis: No Has patient had a PCN reaction that required hospitalization: No Has patient had a PCN reaction occurring within the last 10 years: No If all of the above  answers are "NO", then may proceed with Cephalosporin use.     Outpatient Meds: Current Outpatient Medications  Medication Sig Dispense Refill   acetaminophen (TYLENOL) 325 MG tablet Take 2 tablets (650 mg total) by mouth every 6 (six) hours as needed for moderate pain, fever or headache.     albuterol (VENTOLIN HFA) 108 (90 Base) MCG/ACT inhaler Inhale 1-2 puffs into the lungs every 6 (six) hours as needed for wheezing or shortness of breath.     atorvastatin (LIPITOR) 80 MG tablet Take 1 tablet (80 mg total) by mouth daily. 30 tablet 0   famotidine (PEPCID) 40 MG tablet Take  40 mg by mouth daily.     furosemide (LASIX) 40 MG tablet Take 1 tablet (40 mg total) by mouth daily. 90 tablet 3   gabapentin (NEURONTIN) 400 MG capsule TAKE 1 CAPSULE BY MOUTH 3  TIMES DAILY 270 capsule 3   labetalol (NORMODYNE) 300 MG tablet TAKE 1 TABLET BY MOUTH  TWICE DAILY 90 tablet 0   levothyroxine (SYNTHROID) 100 MCG tablet Take 100 mcg by mouth daily before breakfast.     montelukast (SINGULAIR) 10 MG tablet Take 10 mg by mouth daily.     MYRBETRIQ 25 MG TB24 tablet Take 25 mg by mouth daily.     pantoprazole (PROTONIX) 40 MG tablet Take 1 tablet (40 mg total) by mouth 2 (two) times daily before a meal. 60 tablet 3   polyethylene glycol (MIRALAX / GLYCOLAX) 17 g packet Take 17 g by mouth daily. 14 each 0   sacubitril-valsartan (ENTRESTO) 97-103 MG Take 1 tablet by mouth 2 (two) times daily. 180 tablet 3   spironolactone (ALDACTONE) 25 MG tablet Take 0.5 tablets (12.5 mg total) by mouth daily. 45 tablet 3   traZODone (DESYREL) 50 MG tablet Take 50 mg by mouth at bedtime.     TRELEGY ELLIPTA 100-62.5-25 MCG/ACT AEPB Take 1 puff by mouth daily.     No current facility-administered medications for this visit.      ___________________________________________________________________ Objective   Exam:  There were no vitals taken for this visit. Wt Readings from Last 3 Encounters:  06/12/23 171 lb 12.8 oz  (77.9 kg)  06/05/23 175 lb (79.4 kg)  05/27/23 183 lb (83 kg)   General: well-appearing   Eyes: sclera anicteric, no redness ENT: oral mucosa moist without lesions, no cervical or supraclavicular lymphadenopathy CV: RRR, no JVD, no peripheral edema Resp: clear to auscultation bilaterally, normal RR and effort noted GI: soft, no tenderness, with active bowel sounds. No guarding or palpable organomegaly noted. venous stasis Skin; warm and dry, no rash or jaundice noted Neuro: awake, alert and oriented x 3. Normal gross motor function and fluent speech  Labs: Last hemoglobin was 9.3 during hospitalization in May. Radiologic Studies:  Assessment: Gastroesophageal reflux disease with esophagitis, unspecified whether hemorrhage  Esophageal dysphagia  Hiatal hernia  Chronic constipation  Overall, he has had considerable improvement in his upper GI symptoms with resolution to dysphagia, return of appetite and resulting weight gain.  He will have some episodic regurgitation due to the hiatal hernia, but they have come to expect that and it has only been occasional.  I would not advocate surgical repair given his age and comorbidities.  Chronic constipation, probably multifactorial and perhaps largely due to diuretics in elderly patient.  He does his best to stay mobile and uses a walker most of the day.  Like most folks, he could probably get some more dietary fiber.  I explained how the diuretics can worsen constipation due to increased colonic water absorption, and this can generally be counteracted with the use of MiraLAX.  I also reassured them it is safe to take and he might only need it a couple of times a week if he skips a day without a BM.   Plan: -Use Miralax as needed -Discontinue Pepcid 40, as this is not providing significantly improved acid suppression compared to twice daily PPI. -Decrease Protonix to once daily a week from now, and keep [place] posted if he has any recurrence of  heartburn or dysphagia. No plans for repeat upper endoscopy at this point  unless he has recurrence of dysphagia to suggest stricture.  He has seen primary care since the May hospital discharge and had blood work (by report).  [place] and his family say he has an appointment with primary care again next week and they always get blood work done.  His anemia is presumably being attended to.  -follow up as needed     I,Safa M Kadhim,acting as a scribe for Charlie Pitter III, MD.,have documented all relevant documentation on the behalf of Sherrilyn Rist, MD,as directed by  Sherrilyn Rist, MD while in the presence of Sherrilyn Rist, MD.   Marvis Repress III, MD, have reviewed all documentation for this visit. The documentation on 07/10/23 for the exam, diagnosis, procedures, and orders are all accurate and complete.    32 minutes were spent on this encounter (including chart review, history/exam, counseling/coordination of care, and documentation) > 50% of that time was spent on counseling and coordination of care.   CC: Greta O'Buch, PA (primary care)

## 2023-07-11 ENCOUNTER — Telehealth: Payer: Self-pay

## 2023-07-11 LAB — BASIC METABOLIC PANEL
BUN/Creatinine Ratio: 16 (ref 10–24)
BUN: 20 mg/dL (ref 8–27)
CO2: 25 mmol/L (ref 20–29)
Calcium: 9 mg/dL (ref 8.6–10.2)
Chloride: 106 mmol/L (ref 96–106)
Creatinine, Ser: 1.22 mg/dL (ref 0.76–1.27)
Glucose: 90 mg/dL (ref 70–99)
Potassium: 3.8 mmol/L (ref 3.5–5.2)
Sodium: 145 mmol/L — ABNORMAL HIGH (ref 134–144)
eGFR: 60 mL/min/{1.73_m2} (ref >59)

## 2023-07-11 LAB — VITAMIN B12: Vitamin B-12: >2000 pg/mL — ABNORMAL HIGH (ref 232–1245)

## 2023-07-11 LAB — PRO B NATRIURETIC PEPTIDE: NT-Pro BNP: 2039 pg/mL — ABNORMAL HIGH (ref 0–486)

## 2023-07-11 NOTE — Telephone Encounter (Signed)
Transition Care Management Unsuccessful Follow-up Telephone Call  Date of discharge and from where:  [facility] 8/8  Attempts:  1st Attempt  Reason for unsuccessful TCM follow-up call:  No answer/busy   Lenard Forth [facility] Guide, [facility] [phone] [address] [place], [place], [place] [address] Phone: [phone] Email: [email or web address]

## 2023-07-11 NOTE — Telephone Encounter (Signed)
Transition Care Management Unsuccessful Follow-up Telephone Call  Date of discharge and from where:  [facility] 8/8  Attempts:  2nd Attempt  Reason for unsuccessful TCM follow-up call:  No answer/busy   Lenard Forth [facility] Guide, [facility] [phone] [address] [place], [place], [place] [address] Phone: [phone] Email: [email or web address]

## 2023-07-15 DIAGNOSIS — M47812 Spondylosis without myelopathy or radiculopathy, cervical region: Secondary | ICD-10-CM | POA: Diagnosis not present

## 2023-07-15 DIAGNOSIS — M4722 Other spondylosis with radiculopathy, cervical region: Secondary | ICD-10-CM | POA: Diagnosis not present

## 2023-07-22 DIAGNOSIS — M47812 Spondylosis without myelopathy or radiculopathy, cervical region: Secondary | ICD-10-CM | POA: Diagnosis not present

## 2023-08-01 DIAGNOSIS — Z79899 Other long term (current) drug therapy: Secondary | ICD-10-CM | POA: Diagnosis not present

## 2023-08-01 DIAGNOSIS — E039 Hypothyroidism, unspecified: Secondary | ICD-10-CM | POA: Diagnosis not present

## 2023-08-01 DIAGNOSIS — E1169 Type 2 diabetes mellitus with other specified complication: Secondary | ICD-10-CM | POA: Diagnosis not present

## 2023-08-01 DIAGNOSIS — E785 Hyperlipidemia, unspecified: Secondary | ICD-10-CM | POA: Diagnosis not present

## 2023-08-01 DIAGNOSIS — M25531 Pain in right wrist: Secondary | ICD-10-CM | POA: Diagnosis not present

## 2023-08-01 DIAGNOSIS — E538 Deficiency of other specified B group vitamins: Secondary | ICD-10-CM | POA: Diagnosis not present

## 2023-08-01 DIAGNOSIS — Z6823 Body mass index (BMI) 23.0-23.9, adult: Secondary | ICD-10-CM | POA: Diagnosis not present

## 2023-08-02 DIAGNOSIS — M79641 Pain in right hand: Secondary | ICD-10-CM | POA: Diagnosis not present

## 2023-08-02 DIAGNOSIS — J301 Allergic rhinitis due to pollen: Secondary | ICD-10-CM | POA: Diagnosis not present

## 2023-08-02 DIAGNOSIS — S6991XA Unspecified injury of right wrist, hand and finger(s), initial encounter: Secondary | ICD-10-CM | POA: Diagnosis not present

## 2023-08-02 DIAGNOSIS — J449 Chronic obstructive pulmonary disease, unspecified: Secondary | ICD-10-CM | POA: Diagnosis not present

## 2023-08-02 DIAGNOSIS — R5383 Other fatigue: Secondary | ICD-10-CM | POA: Diagnosis not present

## 2023-08-02 DIAGNOSIS — R911 Solitary pulmonary nodule: Secondary | ICD-10-CM | POA: Diagnosis not present

## 2023-08-02 DIAGNOSIS — G2581 Restless legs syndrome: Secondary | ICD-10-CM | POA: Diagnosis not present

## 2023-08-02 DIAGNOSIS — M25431 Effusion, right wrist: Secondary | ICD-10-CM | POA: Diagnosis not present

## 2023-08-02 DIAGNOSIS — I509 Heart failure, unspecified: Secondary | ICD-10-CM | POA: Diagnosis not present

## 2023-08-02 DIAGNOSIS — R0600 Dyspnea, unspecified: Secondary | ICD-10-CM | POA: Diagnosis not present

## 2023-08-05 DIAGNOSIS — M47812 Spondylosis without myelopathy or radiculopathy, cervical region: Secondary | ICD-10-CM | POA: Diagnosis not present

## 2023-08-12 ENCOUNTER — Ambulatory Visit: Payer: PPO | Attending: Cardiology

## 2023-08-12 DIAGNOSIS — I10 Essential (primary) hypertension: Secondary | ICD-10-CM | POA: Diagnosis not present

## 2023-08-12 DIAGNOSIS — I429 Cardiomyopathy, unspecified: Secondary | ICD-10-CM | POA: Diagnosis not present

## 2023-08-12 DIAGNOSIS — E782 Mixed hyperlipidemia: Secondary | ICD-10-CM

## 2023-08-12 DIAGNOSIS — I2510 Atherosclerotic heart disease of native coronary artery without angina pectoris: Secondary | ICD-10-CM | POA: Diagnosis not present

## 2023-08-12 LAB — ECHOCARDIOGRAM COMPLETE
Calc EF: 39.6 %
S' Lateral: 4.7 cm
Single Plane A2C EF: 47.4 %
Single Plane A4C EF: 31.6 %

## 2023-08-15 DIAGNOSIS — M19139 Post-traumatic osteoarthritis, unspecified wrist: Secondary | ICD-10-CM | POA: Diagnosis not present

## 2023-08-15 DIAGNOSIS — M19131 Post-traumatic osteoarthritis, right wrist: Secondary | ICD-10-CM | POA: Diagnosis not present

## 2023-09-05 DIAGNOSIS — M47812 Spondylosis without myelopathy or radiculopathy, cervical region: Secondary | ICD-10-CM | POA: Diagnosis not present

## 2023-09-12 DIAGNOSIS — Z23 Encounter for immunization: Secondary | ICD-10-CM | POA: Diagnosis not present

## 2023-09-12 DIAGNOSIS — E538 Deficiency of other specified B group vitamins: Secondary | ICD-10-CM | POA: Diagnosis not present

## 2023-09-26 DIAGNOSIS — M19131 Post-traumatic osteoarthritis, right wrist: Secondary | ICD-10-CM | POA: Diagnosis not present

## 2023-10-01 ENCOUNTER — Telehealth: Payer: Self-pay | Admitting: Cardiology

## 2023-10-01 NOTE — Telephone Encounter (Signed)
Pt c/o medication issue:  1. Name of Medication:   sacubitril-valsartan (ENTRESTO) 97-103 MG    2. How are you currently taking this medication (dosage and times per day)?    3. Are you having a reaction (difficulty breathing--STAT)? no  4. What is your medication issue? Calling to speak to the nurse about patient medication. Please advise

## 2023-10-01 NOTE — Telephone Encounter (Signed)
Spoke with [employer] and Sherryll Burger has been ordered. Left a detailed message on Carolyns vm that medication has been ordered and to keep packing slip for future orders. Spoke with Crystal per DPR and advised that pt needs to order his Entresto via the phone and that re-enrollment has opened and they need to complete the paperwork for same. Crystal verbalized understanding and had no additional questions.

## 2023-10-02 DIAGNOSIS — G4733 Obstructive sleep apnea (adult) (pediatric): Secondary | ICD-10-CM | POA: Diagnosis not present

## 2023-10-07 ENCOUNTER — Other Ambulatory Visit: Payer: Self-pay

## 2023-10-07 DIAGNOSIS — E538 Deficiency of other specified B group vitamins: Secondary | ICD-10-CM | POA: Diagnosis not present

## 2023-10-07 MED ORDER — ENTRESTO 97-103 MG PO TABS: 1.0000 | ORAL_TABLET | Freq: Two times a day (BID) | ORAL | 3 refills | Status: DC

## 2023-10-15 DIAGNOSIS — R911 Solitary pulmonary nodule: Secondary | ICD-10-CM | POA: Diagnosis not present

## 2023-10-15 DIAGNOSIS — G2581 Restless legs syndrome: Secondary | ICD-10-CM | POA: Diagnosis not present

## 2023-10-15 DIAGNOSIS — J449 Chronic obstructive pulmonary disease, unspecified: Secondary | ICD-10-CM | POA: Diagnosis not present

## 2023-10-15 DIAGNOSIS — J301 Allergic rhinitis due to pollen: Secondary | ICD-10-CM | POA: Diagnosis not present

## 2023-10-17 DIAGNOSIS — M5030 Other cervical disc degeneration, unspecified cervical region: Secondary | ICD-10-CM | POA: Diagnosis not present

## 2023-10-17 DIAGNOSIS — M542 Cervicalgia: Secondary | ICD-10-CM | POA: Diagnosis not present

## 2023-10-17 DIAGNOSIS — M47812 Spondylosis without myelopathy or radiculopathy, cervical region: Secondary | ICD-10-CM | POA: Diagnosis not present

## 2023-11-05 DIAGNOSIS — I69354 Hemiplegia and hemiparesis following cerebral infarction affecting left non-dominant side: Secondary | ICD-10-CM | POA: Diagnosis not present

## 2023-11-05 DIAGNOSIS — E1169 Type 2 diabetes mellitus with other specified complication: Secondary | ICD-10-CM | POA: Diagnosis not present

## 2023-11-05 DIAGNOSIS — E538 Deficiency of other specified B group vitamins: Secondary | ICD-10-CM | POA: Diagnosis not present

## 2023-11-05 DIAGNOSIS — N3281 Overactive bladder: Secondary | ICD-10-CM | POA: Diagnosis not present

## 2023-11-05 DIAGNOSIS — Z79899 Other long term (current) drug therapy: Secondary | ICD-10-CM | POA: Diagnosis not present

## 2023-11-05 DIAGNOSIS — E039 Hypothyroidism, unspecified: Secondary | ICD-10-CM | POA: Diagnosis not present

## 2023-11-05 DIAGNOSIS — E785 Hyperlipidemia, unspecified: Secondary | ICD-10-CM | POA: Diagnosis not present

## 2023-11-05 DIAGNOSIS — I509 Heart failure, unspecified: Secondary | ICD-10-CM | POA: Diagnosis not present

## 2023-11-05 DIAGNOSIS — Z6825 Body mass index (BMI) 25.0-25.9, adult: Secondary | ICD-10-CM | POA: Diagnosis not present

## 2023-11-08 DIAGNOSIS — M19131 Post-traumatic osteoarthritis, right wrist: Secondary | ICD-10-CM | POA: Diagnosis not present

## 2023-12-09 DIAGNOSIS — G4733 Obstructive sleep apnea (adult) (pediatric): Secondary | ICD-10-CM | POA: Diagnosis not present

## 2023-12-15 ENCOUNTER — Other Ambulatory Visit: Payer: Self-pay | Admitting: Cardiology

## 2024-01-06 ENCOUNTER — Other Ambulatory Visit: Payer: Self-pay | Admitting: Cardiology

## 2024-01-08 DIAGNOSIS — G4733 Obstructive sleep apnea (adult) (pediatric): Secondary | ICD-10-CM | POA: Diagnosis not present

## 2024-01-09 DIAGNOSIS — G4733 Obstructive sleep apnea (adult) (pediatric): Secondary | ICD-10-CM | POA: Diagnosis not present

## 2024-01-13 ENCOUNTER — Other Ambulatory Visit: Payer: Self-pay | Admitting: Cardiology

## 2024-01-14 ENCOUNTER — Encounter: Payer: Self-pay | Admitting: Cardiology

## 2024-01-14 ENCOUNTER — Ambulatory Visit: Payer: PPO | Attending: Cardiology | Admitting: Cardiology

## 2024-01-14 VITALS — BP 150/80 | HR 72 | Ht 71.6 in | Wt 183.6 lb

## 2024-01-14 DIAGNOSIS — I10 Essential (primary) hypertension: Secondary | ICD-10-CM

## 2024-01-14 DIAGNOSIS — I5020 Unspecified systolic (congestive) heart failure: Secondary | ICD-10-CM

## 2024-01-14 DIAGNOSIS — I509 Heart failure, unspecified: Secondary | ICD-10-CM | POA: Diagnosis not present

## 2024-01-14 DIAGNOSIS — E119 Type 2 diabetes mellitus without complications: Secondary | ICD-10-CM | POA: Diagnosis not present

## 2024-01-14 DIAGNOSIS — I429 Cardiomyopathy, unspecified: Secondary | ICD-10-CM | POA: Diagnosis not present

## 2024-01-14 DIAGNOSIS — E088 Diabetes mellitus due to underlying condition with unspecified complications: Secondary | ICD-10-CM

## 2024-01-14 DIAGNOSIS — E782 Mixed hyperlipidemia: Secondary | ICD-10-CM | POA: Diagnosis not present

## 2024-01-14 MED ORDER — DAPAGLIFLOZIN PROPANEDIOL 10 MG PO TABS: 10.0000 mg | ORAL_TABLET | Freq: Every day | ORAL | 0 refills | Status: DC

## 2024-01-14 MED ORDER — SPIRONOLACTONE 25 MG PO TABS: 12.5000 mg | ORAL_TABLET | Freq: Every day | ORAL | 3 refills | Status: AC

## 2024-01-14 MED ORDER — FARXIGA 10 MG PO TABS: 10.0000 mg | ORAL_TABLET | Freq: Every day | ORAL | 3 refills | Status: DC

## 2024-01-14 NOTE — Patient Instructions (Signed)
Medication Instructions:  Your physician has recommended you make the following change in your medication:   Start Farxiga 10 mg daily.   *If you need a refill on your cardiac medications before your next appointment, please call your pharmacy*   Lab Work: None ordered If you have labs (blood work) drawn today and your tests are completely normal, you will receive your results only by: MyChart Message (if you have MyChart) OR A paper copy in the mail If you have any lab test that is abnormal or we need to change your treatment, we will call you to review the results.   Testing/Procedures: None ordered   Follow-Up: At [facility], you and your health needs are our priority.  As part of our continuing mission to provide you with exceptional heart care, we have created designated Provider Care Teams.  These Care Teams include your primary Cardiologist (physician) and Advanced Practice Providers (APPs -  Physician Assistants and Nurse Practitioners) who all work together to provide you with the care you need, when you need it.  We recommend signing up for the patient portal called "MyChart".  Sign up information is provided on this After Visit Summary.  MyChart is used to connect with patients for Virtual Visits (Telemedicine).  Patients are able to view lab/test results, encounter notes, upcoming appointments, etc.  Non-urgent messages can be sent to your provider as well.   To learn more about what you can do with MyChart, go to [email or web address].    Your next appointment:   6 month(s)  The format for your next appointment:   In Person  Provider:   Belva Crome, MD    Other Instructions none  Important Information About Sugar

## 2024-01-14 NOTE — Progress Notes (Signed)
Cardiology Office Note:    Date:  01/14/2024   ID:  ROGER KETTLES, DOB [birth year], MRN [id number]  PCP:  Eunice Blase, PA-C  Cardiologist:  Garwin Brothers, MD   Referring MD: Eunice Blase, PA-C    ASSESSMENT:    1. Cardiomyopathy, unspecified type (HCC)   2. Controlled type 2 diabetes mellitus without complication, without long-term current use of insulin (HCC)   3. Heart failure, type unknown (HCC)   4. Heart failure with reduced ejection fraction (HCC)   5. Essential hypertension   6. Diabetes mellitus due to underlying condition with unspecified complications (HCC)   7. Mixed hyperlipidemia    PLAN:    In order of problems listed above:  Coronary artery disease: Stable at this time.  Patient is comfortable and asymptomatic.  He denies any chest pain orthopnea or PND.  He is well ambulatory age appropriately. Cardiomyopathy: Will start him on Jardiance today.  He is taking all other guideline directed medical therapy at this time he is comfortable with this. Essential hypertension: Blood pressure stable and diet was emphasized.  His blood pressure medications were reviewed his blood pressures from home are fine. Congestive heart failure education given salt intake issues Daily weight checks emphasized. Mixed dyslipidemia: On lipid-lowering medications followed by primary care.  Diet emphasized. Patient will be seen in follow-up appointment in 6 months or earlier if the patient has any concerns.    Medication Adjustments/Labs and Tests Ordered: Current medicines are reviewed at length with the patient today.  Concerns regarding medicines are outlined above.  No orders of the defined types were placed in this encounter.  No orders of the defined types were placed in this encounter.    No chief complaint on file.    History of Present Illness:    [name] is a 78 y.o. male.  Patient has past medical history of essential hypertension, dyslipidemia, diabetes  mellitus and cardiomyopathy.  Patient denies any problems at this time and takes care of activities of daily living.  He is active and walking on a regular basis.  At the time of my evaluation, the patient is alert awake oriented and in no distress.  His ejection fraction is improved significantly.    Past Medical History:  Diagnosis Date   Abnormal loss of weight 08/31/2019   Abnormality of gait 03/31/2013   Acute blood loss anemia 04/11/2023   ALLERGIC RHINITIS 11/06/2007   Qualifier: Diagnosis of   By: Jonny Ruiz MD, Len Blalock     Replacing diagnoses that were inactivated after the 02/25/23 regulatory import   Anemia, unspecified 08/22/2012   BENIGN PROSTATIC HYPERTROPHY 07/14/2007   CAD (coronary artery disease) 08/01/2022   Cardiomyopathy (HCC) 06/11/2022   Cellulitis of left hand 04/16/2023   Cerebrovascular disease 04/11/2023   Cervical spondylosis with radiculopathy 06/11/2022   Chronic osteomyelitis of toe of left foot (HCC) 09/12/2017   Chronic right shoulder pain 06/29/2022   COLONIC POLYPS, HX OF 07/14/2007   Complication of anesthesia    pt states he needs to be cathed after surgery   Controlled type 2 diabetes mellitus without complication, without long-term current use of insulin (HCC) 08/22/2012   COPD (chronic obstructive pulmonary disease) (HCC) 04/11/2023   COVID-19    Degenerative arthritis of right knee 12/12/2011   Diabetes (HCC) 02/03/2013   Diet controlled   Diabetes mellitus due to underlying condition with unspecified complications (HCC) 06/11/2022   Disequilibrium 03/31/2013   Dyspnea 08/31/2019   Encounter for well  adult exam with abnormal findings 12/10/2011   Essential hypertension 07/11/2007   Qualifier: Diagnosis of  By: Maris Berger    Former smoker 02/08/2015   GERD 07/11/2007   GERD with esophagitis 04/10/2023   Heart failure with reduced ejection fraction (HCC) 04/10/2023   Hematemesis due to hiatal hernia/esophagitis 04/10/2023   Hiatal  hernia 04/11/2023   History of colonic polyps 07/14/2007   Qualifier: Diagnosis of   By: Jonny Ruiz MD, Len Blalock     IMO SNOMED Dx Update Oct 2024     Hypercalcemia 08/12/2015   Hyperlipidemia    Hyperthyroidism    Hypervitaminosis D 01/14/2019   Hypokalemia 04/10/2023   Hypothyroidism    Hypoxia    Lacunar infarction (HCC) 03/31/2013   Microalbuminuria 08/31/2019   Neck pain 03/26/2022   Neural foraminal stenosis of cervical spine 06/11/2022   Odynophagia 04/10/2023   OSTEOARTHRITIS, KNEE, RIGHT 10/27/2008   PERIPHERAL EDEMA 05/31/2009   Polyarthralgia 09/12/2017   Primary osteoarthritis of right shoulder 06/29/2022   PULMONARY NODULE 06/24/2008   granuloma   Recurrent falls 08/17/2016   Right shoulder pain 08/17/2016   Rotator cuff arthropathy 09/07/2016   Injected 09/07/2016 Repeat injection 01/07/17 Repeat injection April 20th 2018. Repeat injection 05/22/2017 Repeat injection 08/15/2017   Skin lesion 02/19/2017   Spinal stenosis in cervical region 03/31/2013   Spondylosis, cervical, with myelopathy 11/11/2012   TIA (transient ischemic attack) 08/13/2015   Upper back pain on right side 08/31/2019   Vitamin D deficiency 08/12/2015    Past Surgical History:  Procedure Laterality Date   ANTERIOR CERVICAL DECOMP/DISCECTOMY FUSION N/A 01/06/2013   Procedure: ANTERIOR CERVICAL DECOMPRESSION/DISCECTOMY FUSION 1 LEVEL;  Surgeon: Maeola Harman, MD;  Location: [facility];  Service: Neurosurgery;  Laterality: N/A;  Cervical three-four Anterior cervical decompression/diskectomy/fusion   BACK SURGERY  1986   x 2  1986 C 3   ESOPHAGOGASTRODUODENOSCOPY (EGD) WITH PROPOFOL N/A 04/11/2023   Procedure: ESOPHAGOGASTRODUODENOSCOPY (EGD) WITH PROPOFOL;  Surgeon: Sherrilyn Rist, MD;  Location: [facility] ENDOSCOPY;  Service: Gastroenterology;  Laterality: N/A;   HERNIA REPAIR  sept 1986   left knee replacement  Nov 21, 2007   NECK SURGERY     NISSEN FUNDOPLICATION  2000   right heel bone spur  1987    TOTAL KNEE ARTHROPLASTY  01/09/2012   Procedure: TOTAL KNEE ARTHROPLASTY;  Surgeon: Jacki Cones, MD;  Location: [facility] ORS;  Service: Orthopedics;  Laterality: Right;    Current Medications: Current Meds  Medication Sig   acetaminophen (TYLENOL) 325 MG tablet Take 2 tablets (650 mg total) by mouth every 6 (six) hours as needed for moderate pain, fever or headache.   albuterol (VENTOLIN HFA) 108 (90 Base) MCG/ACT inhaler Inhale 1-2 puffs into the lungs every 6 (six) hours as needed for wheezing or shortness of breath.   atorvastatin (LIPITOR) 80 MG tablet Take 1 tablet (80 mg total) by mouth daily.   furosemide (LASIX) 40 MG tablet Take 1 tablet (40 mg total) by mouth daily.   gabapentin (NEURONTIN) 400 MG capsule TAKE 1 CAPSULE BY MOUTH 3  TIMES DAILY   labetalol (NORMODYNE) 300 MG tablet TAKE 1 TABLET BY MOUTH  TWICE DAILY   levothyroxine (SYNTHROID) 100 MCG tablet Take 100 mcg by mouth daily before breakfast.   montelukast (SINGULAIR) 10 MG tablet Take 10 mg by mouth daily.   MYRBETRIQ 25 MG TB24 tablet Take 25 mg by mouth daily.   pantoprazole (PROTONIX) 40 MG tablet Take 1 tablet (40 mg total)  by mouth 2 (two) times daily before a meal.   rOPINIRole (REQUIP) 1 MG tablet Take 1 mg by mouth at bedtime.   sacubitril-valsartan (ENTRESTO) 97-103 MG Take 1 tablet by mouth 2 (two) times daily.   spironolactone (ALDACTONE) 25 MG tablet Take 0.5 tablets (12.5 mg total) by mouth daily.   traZODone (DESYREL) 50 MG tablet Take 50 mg by mouth at bedtime.   TRELEGY ELLIPTA 100-62.5-25 MCG/ACT AEPB Take 1 puff by mouth daily.     Allergies:   Latex, Codeine, Zolpidem tartrate, Hct [hydrochlorothiazide], Metformin and related, Naproxen, Prednisone, Adhesive [tape], and Penicillins   Social History   Socioeconomic History   Marital status: Married    Spouse name: Eber Jones   Number of children: 2   Years of education: HS   Highest education level: Not on file  Occupational History   Occupation:  Retired  Tobacco Use   Smoking status: Former    Current packs/day: 0.00    Average packs/day: 3.0 packs/day for 30.0 years (90.0 ttl pk-yrs)    Types: Cigarettes    Start date: 11/26/1952    Quit date: 11/26/1982    Years since quitting: 41.1   Smokeless tobacco: Never  Substance and Sexual Activity   Alcohol use: No   Drug use: No   Sexual activity: Not on file  Other Topics Concern   Not on file  Social History Narrative   Patient lives at home with spouse.   Caffiene Use: 4 cups daily   Social Drivers of Health   Financial Resource Strain: Low Risk  (01/18/2023)   Received from [facility], [facility]   Overall Financial Resource Strain (CARDIA)    Difficulty of Paying Living Expenses: Not hard at all  Food Insecurity: No Food Insecurity (04/16/2023)   Hunger Vital Sign    Worried About Running Out of Food in the Last Year: Never true    Ran Out of Food in the Last Year: Never true  Transportation Needs: No Transportation Needs (04/16/2023)   PRAPARE - Administrator, Civil Service (Medical): No    Lack of Transportation (Non-Medical): No  Physical Activity: Not on file  Stress: Not on file  Social Connections: Unknown (03/26/2022)   Received from [facility], [facility]   Social Network    Social Network: Not on file     Family History: The patient's family history includes Emphysema in his father; Heart attack in his father and mother; Heart disease in an other family member. There is no history of Diabetes, Thyroid disease, or Hypercalcemia.  ROS:   Please see the history of present illness.    All other systems reviewed and are negative.  EKGs/Labs/Other Studies Reviewed:    The following studies were reviewed today: I discussed my findings with the patient at length.   Recent Labs: 04/11/2023: Magnesium 1.8 04/15/2023: ALT 16 04/17/2023: Hemoglobin 9.3; Platelets 456; TSH 6.842 07/10/2023: BUN 20; Creatinine, Ser 1.22; NT-Pro BNP 2,039;  Potassium 3.8; Sodium 145  Recent Lipid Panel    Component Value Date/Time   CHOL 147 07/23/2022 0825   TRIG 113 07/23/2022 0825   HDL 40 07/23/2022 0825   CHOLHDL 3.7 07/23/2022 0825   CHOLHDL 3 02/22/2020 1301   VLDL 34.6 02/22/2020 1301   LDLCALC 86 07/23/2022 0825   LDLDIRECT 84.0 08/25/2018 0848    Physical Exam:    VS:  BP (!) 150/80   Pulse 72   Ht 5' 11.6" (1.819 m)   Wt  183 lb 9.6 oz (83.3 kg)   SpO2 97%   BMI 25.18 kg/m     Wt Readings from Last 3 Encounters:  01/14/24 183 lb 9.6 oz (83.3 kg)  07/10/23 178 lb (80.7 kg)  07/10/23 176 lb 8 oz (80.1 kg)     GEN: Patient is in no acute distress HEENT: Normal NECK: No JVD; No carotid bruits LYMPHATICS: No lymphadenopathy CARDIAC: Hear sounds regular, 2/6 systolic murmur at the apex. RESPIRATORY:  Clear to auscultation without rales, wheezing or rhonchi  ABDOMEN: Soft, non-tender, non-distended MUSCULOSKELETAL:  No edema; No deformity  SKIN: Warm and dry NEUROLOGIC:  Alert and oriented x 3 PSYCHIATRIC:  Normal affect   Signed, Garwin Brothers, MD  01/14/2024 3:38 PM    [facility]

## 2024-01-23 DIAGNOSIS — R911 Solitary pulmonary nodule: Secondary | ICD-10-CM | POA: Diagnosis not present

## 2024-01-23 DIAGNOSIS — G2581 Restless legs syndrome: Secondary | ICD-10-CM | POA: Diagnosis not present

## 2024-01-23 DIAGNOSIS — J449 Chronic obstructive pulmonary disease, unspecified: Secondary | ICD-10-CM | POA: Diagnosis not present

## 2024-01-23 DIAGNOSIS — J301 Allergic rhinitis due to pollen: Secondary | ICD-10-CM | POA: Diagnosis not present

## 2024-02-05 DIAGNOSIS — I69354 Hemiplegia and hemiparesis following cerebral infarction affecting left non-dominant side: Secondary | ICD-10-CM | POA: Diagnosis not present

## 2024-02-05 DIAGNOSIS — J449 Chronic obstructive pulmonary disease, unspecified: Secondary | ICD-10-CM | POA: Diagnosis not present

## 2024-02-05 DIAGNOSIS — Z6824 Body mass index (BMI) 24.0-24.9, adult: Secondary | ICD-10-CM | POA: Diagnosis not present

## 2024-02-05 DIAGNOSIS — E039 Hypothyroidism, unspecified: Secondary | ICD-10-CM | POA: Diagnosis not present

## 2024-02-05 DIAGNOSIS — E1169 Type 2 diabetes mellitus with other specified complication: Secondary | ICD-10-CM | POA: Diagnosis not present

## 2024-02-05 DIAGNOSIS — N3281 Overactive bladder: Secondary | ICD-10-CM | POA: Diagnosis not present

## 2024-02-05 DIAGNOSIS — E785 Hyperlipidemia, unspecified: Secondary | ICD-10-CM | POA: Diagnosis not present

## 2024-02-05 DIAGNOSIS — E538 Deficiency of other specified B group vitamins: Secondary | ICD-10-CM | POA: Diagnosis not present

## 2024-02-05 DIAGNOSIS — I509 Heart failure, unspecified: Secondary | ICD-10-CM | POA: Diagnosis not present

## 2024-02-05 DIAGNOSIS — Z79899 Other long term (current) drug therapy: Secondary | ICD-10-CM | POA: Diagnosis not present

## 2024-02-06 DIAGNOSIS — G4733 Obstructive sleep apnea (adult) (pediatric): Secondary | ICD-10-CM | POA: Diagnosis not present

## 2024-02-13 DIAGNOSIS — M5412 Radiculopathy, cervical region: Secondary | ICD-10-CM | POA: Diagnosis not present

## 2024-02-13 DIAGNOSIS — M7541 Impingement syndrome of right shoulder: Secondary | ICD-10-CM | POA: Diagnosis not present

## 2024-02-13 DIAGNOSIS — M4802 Spinal stenosis, cervical region: Secondary | ICD-10-CM | POA: Diagnosis not present

## 2024-02-13 DIAGNOSIS — Z1330 Encounter for screening examination for mental health and behavioral disorders, unspecified: Secondary | ICD-10-CM | POA: Diagnosis not present

## 2024-02-18 ENCOUNTER — Telehealth: Payer: Self-pay

## 2024-02-18 DIAGNOSIS — E119 Type 2 diabetes mellitus without complications: Secondary | ICD-10-CM

## 2024-02-18 DIAGNOSIS — J439 Emphysema, unspecified: Secondary | ICD-10-CM | POA: Diagnosis not present

## 2024-02-18 DIAGNOSIS — E1142 Type 2 diabetes mellitus with diabetic polyneuropathy: Secondary | ICD-10-CM | POA: Diagnosis not present

## 2024-02-18 DIAGNOSIS — I429 Cardiomyopathy, unspecified: Secondary | ICD-10-CM

## 2024-02-18 DIAGNOSIS — E1169 Type 2 diabetes mellitus with other specified complication: Secondary | ICD-10-CM | POA: Diagnosis not present

## 2024-02-18 DIAGNOSIS — I5022 Chronic systolic (congestive) heart failure: Secondary | ICD-10-CM | POA: Diagnosis not present

## 2024-02-18 DIAGNOSIS — E1136 Type 2 diabetes mellitus with diabetic cataract: Secondary | ICD-10-CM | POA: Diagnosis not present

## 2024-02-18 DIAGNOSIS — E1122 Type 2 diabetes mellitus with diabetic chronic kidney disease: Secondary | ICD-10-CM | POA: Diagnosis not present

## 2024-02-18 DIAGNOSIS — I69352 Hemiplegia and hemiparesis following cerebral infarction affecting left dominant side: Secondary | ICD-10-CM | POA: Diagnosis not present

## 2024-02-18 DIAGNOSIS — E1151 Type 2 diabetes mellitus with diabetic peripheral angiopathy without gangrene: Secondary | ICD-10-CM | POA: Diagnosis not present

## 2024-02-18 DIAGNOSIS — I5020 Unspecified systolic (congestive) heart failure: Secondary | ICD-10-CM

## 2024-02-18 DIAGNOSIS — E261 Secondary hyperaldosteronism: Secondary | ICD-10-CM | POA: Diagnosis not present

## 2024-02-18 DIAGNOSIS — I70213 Atherosclerosis of native arteries of extremities with intermittent claudication, bilateral legs: Secondary | ICD-10-CM | POA: Diagnosis not present

## 2024-02-18 DIAGNOSIS — I700 Atherosclerosis of aorta: Secondary | ICD-10-CM | POA: Diagnosis not present

## 2024-02-18 DIAGNOSIS — G63 Polyneuropathy in diseases classified elsewhere: Secondary | ICD-10-CM | POA: Diagnosis not present

## 2024-02-18 MED ORDER — FARXIGA 10 MG PO TABS: 10.0000 mg | ORAL_TABLET | Freq: Every day | ORAL | 3 refills | Status: DC

## 2024-02-18 NOTE — Telephone Encounter (Signed)
 Patient has been approved for assistance with Farxiga from 02/17/24 until 11/25/24.  I have sent the prescription to [facility] per request of [place] so patient may start to receive the medication shipped directly to his home.  Called and spoke with Eber Jones per DPR and informed her about the approval, she thanked me for the call and had no additional questions.

## 2024-02-19 ENCOUNTER — Telehealth: Payer: Self-pay

## 2024-02-19 NOTE — Telephone Encounter (Signed)
 Received fax from [employer] patient assistance stating patient has been approved for his Entresto until 11/25/24.

## 2024-02-25 DIAGNOSIS — J309 Allergic rhinitis, unspecified: Secondary | ICD-10-CM | POA: Diagnosis not present

## 2024-02-25 DIAGNOSIS — G2581 Restless legs syndrome: Secondary | ICD-10-CM | POA: Diagnosis not present

## 2024-02-25 DIAGNOSIS — I5040 Unspecified combined systolic (congestive) and diastolic (congestive) heart failure: Secondary | ICD-10-CM | POA: Diagnosis not present

## 2024-02-25 DIAGNOSIS — G4733 Obstructive sleep apnea (adult) (pediatric): Secondary | ICD-10-CM | POA: Diagnosis not present

## 2024-02-25 DIAGNOSIS — R911 Solitary pulmonary nodule: Secondary | ICD-10-CM | POA: Diagnosis not present

## 2024-02-25 DIAGNOSIS — J449 Chronic obstructive pulmonary disease, unspecified: Secondary | ICD-10-CM | POA: Diagnosis not present

## 2024-02-25 DIAGNOSIS — Z6826 Body mass index (BMI) 26.0-26.9, adult: Secondary | ICD-10-CM | POA: Diagnosis not present

## 2024-03-03 ENCOUNTER — Telehealth: Payer: Self-pay | Admitting: Cardiology

## 2024-03-03 MED ORDER — ENTRESTO 97-103 MG PO TABS: 1.0000 | ORAL_TABLET | Freq: Two times a day (BID) | ORAL | 3 refills | Status: DC

## 2024-03-03 NOTE — Telephone Encounter (Signed)
*  STAT* If patient is at the pharmacy, call can be transferred to refill team.   1. Which medications need to be refilled? (please list name of each medication and dose if known) sacubitril-valsartan (ENTRESTO) 97-103 MG    2. Would you like to learn more about the convenience, safety, & potential cost savings by using the [facility]? No   3. Are you open to using the [facility] (Type [facility].  ). No   4. Which pharmacy/location (including street and city if local pharmacy) is medication to be sent to? [facility] - [place], [place] - [address]    5. Do they need a 30 day or 90 day supply? 90 day

## 2024-03-08 DIAGNOSIS — G4733 Obstructive sleep apnea (adult) (pediatric): Secondary | ICD-10-CM | POA: Diagnosis not present

## 2024-03-09 DIAGNOSIS — G4733 Obstructive sleep apnea (adult) (pediatric): Secondary | ICD-10-CM | POA: Diagnosis not present

## 2024-04-07 DIAGNOSIS — G4733 Obstructive sleep apnea (adult) (pediatric): Secondary | ICD-10-CM | POA: Diagnosis not present

## 2024-04-10 ENCOUNTER — Telehealth: Payer: Self-pay | Admitting: Cardiology

## 2024-04-10 MED ORDER — ENTRESTO 97-103 MG PO TABS: 1.0000 | ORAL_TABLET | Freq: Two times a day (BID) | ORAL | 3 refills | Status: DC

## 2024-04-10 NOTE — Telephone Encounter (Signed)
*  STAT* If patient is at the pharmacy, call can be transferred to refill team.   1. Which medications need to be refilled? (please list name of each medication and dose if known)   sacubitril -valsartan  (ENTRESTO ) 97-103 MG    2. Which pharmacy/location (including street and city if local pharmacy) is medication to be sent to? [facility] [place], [place] - [address] Phone: [phone]  Fax: [phone]     3. Do they need a 30 day or 90 day supply? 90

## 2024-04-10 NOTE — Telephone Encounter (Signed)
 Pt's medication was sent to pt's pharmacy as requested. Confirmation received.

## 2024-04-13 DIAGNOSIS — J449 Chronic obstructive pulmonary disease, unspecified: Secondary | ICD-10-CM | POA: Diagnosis not present

## 2024-04-13 DIAGNOSIS — R5383 Other fatigue: Secondary | ICD-10-CM | POA: Diagnosis not present

## 2024-04-13 DIAGNOSIS — R0600 Dyspnea, unspecified: Secondary | ICD-10-CM | POA: Diagnosis not present

## 2024-04-13 DIAGNOSIS — J301 Allergic rhinitis due to pollen: Secondary | ICD-10-CM | POA: Diagnosis not present

## 2024-04-13 DIAGNOSIS — R911 Solitary pulmonary nodule: Secondary | ICD-10-CM | POA: Diagnosis not present

## 2024-04-14 DIAGNOSIS — H2512 Age-related nuclear cataract, left eye: Secondary | ICD-10-CM | POA: Diagnosis not present

## 2024-04-14 DIAGNOSIS — Z01818 Encounter for other preprocedural examination: Secondary | ICD-10-CM | POA: Diagnosis not present

## 2024-04-14 DIAGNOSIS — H353111 Nonexudative age-related macular degeneration, right eye, early dry stage: Secondary | ICD-10-CM | POA: Diagnosis not present

## 2024-04-14 DIAGNOSIS — E119 Type 2 diabetes mellitus without complications: Secondary | ICD-10-CM | POA: Diagnosis not present

## 2024-04-21 DIAGNOSIS — E1136 Type 2 diabetes mellitus with diabetic cataract: Secondary | ICD-10-CM | POA: Diagnosis not present

## 2024-04-21 DIAGNOSIS — H2512 Age-related nuclear cataract, left eye: Secondary | ICD-10-CM | POA: Diagnosis not present

## 2024-04-21 DIAGNOSIS — I69351 Hemiplegia and hemiparesis following cerebral infarction affecting right dominant side: Secondary | ICD-10-CM | POA: Diagnosis not present

## 2024-04-21 DIAGNOSIS — H2181 Floppy iris syndrome: Secondary | ICD-10-CM | POA: Diagnosis not present

## 2024-04-21 DIAGNOSIS — H5703 Miosis: Secondary | ICD-10-CM | POA: Diagnosis not present

## 2024-04-21 DIAGNOSIS — Z87891 Personal history of nicotine dependence: Secondary | ICD-10-CM | POA: Diagnosis not present

## 2024-04-21 DIAGNOSIS — E039 Hypothyroidism, unspecified: Secondary | ICD-10-CM | POA: Diagnosis not present

## 2024-04-21 DIAGNOSIS — H353111 Nonexudative age-related macular degeneration, right eye, early dry stage: Secondary | ICD-10-CM | POA: Diagnosis not present

## 2024-04-21 DIAGNOSIS — G4733 Obstructive sleep apnea (adult) (pediatric): Secondary | ICD-10-CM | POA: Diagnosis not present

## 2024-04-21 DIAGNOSIS — I69328 Other speech and language deficits following cerebral infarction: Secondary | ICD-10-CM | POA: Diagnosis not present

## 2024-04-21 DIAGNOSIS — E7800 Pure hypercholesterolemia, unspecified: Secondary | ICD-10-CM | POA: Diagnosis not present

## 2024-04-21 DIAGNOSIS — Z79899 Other long term (current) drug therapy: Secondary | ICD-10-CM | POA: Diagnosis not present

## 2024-04-21 DIAGNOSIS — Z7982 Long term (current) use of aspirin: Secondary | ICD-10-CM | POA: Diagnosis not present

## 2024-04-21 DIAGNOSIS — I69320 Aphasia following cerebral infarction: Secondary | ICD-10-CM | POA: Diagnosis not present

## 2024-04-21 DIAGNOSIS — I69393 Ataxia following cerebral infarction: Secondary | ICD-10-CM | POA: Diagnosis not present

## 2024-04-21 DIAGNOSIS — J449 Chronic obstructive pulmonary disease, unspecified: Secondary | ICD-10-CM | POA: Diagnosis not present

## 2024-04-21 DIAGNOSIS — I509 Heart failure, unspecified: Secondary | ICD-10-CM | POA: Diagnosis not present

## 2024-04-21 DIAGNOSIS — N400 Enlarged prostate without lower urinary tract symptoms: Secondary | ICD-10-CM | POA: Diagnosis not present

## 2024-04-21 DIAGNOSIS — I110 Hypertensive heart disease with heart failure: Secondary | ICD-10-CM | POA: Diagnosis not present

## 2024-04-21 DIAGNOSIS — Z7985 Long-term (current) use of injectable non-insulin antidiabetic drugs: Secondary | ICD-10-CM | POA: Diagnosis not present

## 2024-04-21 DIAGNOSIS — H259 Unspecified age-related cataract: Secondary | ICD-10-CM | POA: Diagnosis not present

## 2024-05-05 DIAGNOSIS — Z79899 Other long term (current) drug therapy: Secondary | ICD-10-CM | POA: Diagnosis not present

## 2024-05-05 DIAGNOSIS — E785 Hyperlipidemia, unspecified: Secondary | ICD-10-CM | POA: Diagnosis not present

## 2024-05-05 DIAGNOSIS — Z6824 Body mass index (BMI) 24.0-24.9, adult: Secondary | ICD-10-CM | POA: Diagnosis not present

## 2024-05-05 DIAGNOSIS — E538 Deficiency of other specified B group vitamins: Secondary | ICD-10-CM | POA: Diagnosis not present

## 2024-05-05 DIAGNOSIS — E1169 Type 2 diabetes mellitus with other specified complication: Secondary | ICD-10-CM | POA: Diagnosis not present

## 2024-05-05 DIAGNOSIS — E039 Hypothyroidism, unspecified: Secondary | ICD-10-CM | POA: Diagnosis not present

## 2024-05-08 DIAGNOSIS — G4733 Obstructive sleep apnea (adult) (pediatric): Secondary | ICD-10-CM | POA: Diagnosis not present

## 2024-05-12 ENCOUNTER — Telehealth: Payer: Self-pay | Admitting: Cardiology

## 2024-05-12 DIAGNOSIS — J449 Chronic obstructive pulmonary disease, unspecified: Secondary | ICD-10-CM | POA: Diagnosis not present

## 2024-05-12 DIAGNOSIS — J301 Allergic rhinitis due to pollen: Secondary | ICD-10-CM | POA: Diagnosis not present

## 2024-05-12 DIAGNOSIS — R911 Solitary pulmonary nodule: Secondary | ICD-10-CM | POA: Diagnosis not present

## 2024-05-12 DIAGNOSIS — G2581 Restless legs syndrome: Secondary | ICD-10-CM | POA: Diagnosis not present

## 2024-05-12 MED ORDER — ENTRESTO 97-103 MG PO TABS: 1.0000 | ORAL_TABLET | Freq: Two times a day (BID) | ORAL | 2 refills | Status: DC

## 2024-05-12 NOTE — Telephone Encounter (Signed)
*  STAT* If patient is at the pharmacy, call can be transferred to refill team.   1. Which medications need to be refilled? (please list name of each medication and dose if known) sacubitril -valsartan  (ENTRESTO ) 97-103 MG    2. Would you like to learn more about the convenience, safety, & potential cost savings by using the [facility]?     3. Are you open to using the [facility] ([facility]  ).   4. Which pharmacy/location (including street and city if local pharmacy) is medication to be sent to? [facility] - [place], [place] - [address]    5. Do they need a 30 day or 90 day supply? 90 day   Pt requesting samples

## 2024-05-12 NOTE — Telephone Encounter (Signed)
 Pt's medication was sent to pt's pharmacy as requested. Confirmation received.

## 2024-05-14 DIAGNOSIS — M5030 Other cervical disc degeneration, unspecified cervical region: Secondary | ICD-10-CM | POA: Diagnosis not present

## 2024-05-14 DIAGNOSIS — M5412 Radiculopathy, cervical region: Secondary | ICD-10-CM | POA: Diagnosis not present

## 2024-05-14 DIAGNOSIS — M7541 Impingement syndrome of right shoulder: Secondary | ICD-10-CM | POA: Diagnosis not present

## 2024-05-14 DIAGNOSIS — G8929 Other chronic pain: Secondary | ICD-10-CM | POA: Diagnosis not present

## 2024-05-14 DIAGNOSIS — M25511 Pain in right shoulder: Secondary | ICD-10-CM | POA: Diagnosis not present

## 2024-05-14 DIAGNOSIS — M47812 Spondylosis without myelopathy or radiculopathy, cervical region: Secondary | ICD-10-CM | POA: Diagnosis not present

## 2024-05-19 DIAGNOSIS — E039 Hypothyroidism, unspecified: Secondary | ICD-10-CM | POA: Diagnosis not present

## 2024-05-19 DIAGNOSIS — I119 Hypertensive heart disease without heart failure: Secondary | ICD-10-CM | POA: Diagnosis not present

## 2024-05-19 DIAGNOSIS — Z7982 Long term (current) use of aspirin: Secondary | ICD-10-CM | POA: Diagnosis not present

## 2024-05-19 DIAGNOSIS — H259 Unspecified age-related cataract: Secondary | ICD-10-CM | POA: Diagnosis not present

## 2024-05-19 DIAGNOSIS — G4733 Obstructive sleep apnea (adult) (pediatric): Secondary | ICD-10-CM | POA: Diagnosis not present

## 2024-05-19 DIAGNOSIS — H2511 Age-related nuclear cataract, right eye: Secondary | ICD-10-CM | POA: Diagnosis not present

## 2024-05-19 DIAGNOSIS — I69393 Ataxia following cerebral infarction: Secondary | ICD-10-CM | POA: Diagnosis not present

## 2024-05-19 DIAGNOSIS — E7800 Pure hypercholesterolemia, unspecified: Secondary | ICD-10-CM | POA: Diagnosis not present

## 2024-05-19 DIAGNOSIS — I69328 Other speech and language deficits following cerebral infarction: Secondary | ICD-10-CM | POA: Diagnosis not present

## 2024-05-19 DIAGNOSIS — E1136 Type 2 diabetes mellitus with diabetic cataract: Secondary | ICD-10-CM | POA: Diagnosis not present

## 2024-05-19 DIAGNOSIS — H2181 Floppy iris syndrome: Secondary | ICD-10-CM | POA: Diagnosis not present

## 2024-05-19 DIAGNOSIS — Z7985 Long-term (current) use of injectable non-insulin antidiabetic drugs: Secondary | ICD-10-CM | POA: Diagnosis not present

## 2024-05-19 DIAGNOSIS — Z79899 Other long term (current) drug therapy: Secondary | ICD-10-CM | POA: Diagnosis not present

## 2024-05-19 DIAGNOSIS — N400 Enlarged prostate without lower urinary tract symptoms: Secondary | ICD-10-CM | POA: Diagnosis not present

## 2024-05-19 DIAGNOSIS — H5703 Miosis: Secondary | ICD-10-CM | POA: Diagnosis not present

## 2024-05-19 DIAGNOSIS — J449 Chronic obstructive pulmonary disease, unspecified: Secondary | ICD-10-CM | POA: Diagnosis not present

## 2024-05-25 DIAGNOSIS — R0600 Dyspnea, unspecified: Secondary | ICD-10-CM | POA: Diagnosis not present

## 2024-05-25 DIAGNOSIS — G4733 Obstructive sleep apnea (adult) (pediatric): Secondary | ICD-10-CM | POA: Diagnosis not present

## 2024-05-25 DIAGNOSIS — J449 Chronic obstructive pulmonary disease, unspecified: Secondary | ICD-10-CM | POA: Diagnosis not present

## 2024-05-25 DIAGNOSIS — J301 Allergic rhinitis due to pollen: Secondary | ICD-10-CM | POA: Diagnosis not present

## 2024-06-04 ENCOUNTER — Telehealth: Payer: Self-pay | Admitting: Cardiology

## 2024-06-04 NOTE — Telephone Encounter (Signed)
 Left message to return call

## 2024-06-04 NOTE — Telephone Encounter (Signed)
 Pt c/o medication issue:  1. Name of Medication:   sacubitril -valsartan  (ENTRESTO ) 97-103 MG    2. How are you currently taking this medication (dosage and times per day)? As written  3. Are you having a reaction (difficulty breathing--STAT)? no  4. What is your medication issue?   Pt said cover my meds told pt there have sent medication but pt still doesn't have it

## 2024-06-05 DIAGNOSIS — I2510 Atherosclerotic heart disease of native coronary artery without angina pectoris: Secondary | ICD-10-CM | POA: Diagnosis not present

## 2024-06-05 DIAGNOSIS — K449 Diaphragmatic hernia without obstruction or gangrene: Secondary | ICD-10-CM | POA: Diagnosis not present

## 2024-06-05 DIAGNOSIS — R911 Solitary pulmonary nodule: Secondary | ICD-10-CM | POA: Diagnosis not present

## 2024-06-07 DIAGNOSIS — G4733 Obstructive sleep apnea (adult) (pediatric): Secondary | ICD-10-CM | POA: Diagnosis not present

## 2024-06-08 DIAGNOSIS — G4733 Obstructive sleep apnea (adult) (pediatric): Secondary | ICD-10-CM | POA: Diagnosis not present

## 2024-06-08 NOTE — Telephone Encounter (Signed)
 Left vm to return call.

## 2024-06-09 NOTE — Telephone Encounter (Signed)
 Patient returned RN's call.

## 2024-06-09 NOTE — Telephone Encounter (Signed)
 Called [facility] and Entresto  will be sent to pt tomorrow via [employer]. Pt is aware.

## 2024-06-10 DIAGNOSIS — R911 Solitary pulmonary nodule: Secondary | ICD-10-CM | POA: Diagnosis not present

## 2024-06-10 DIAGNOSIS — J432 Centrilobular emphysema: Secondary | ICD-10-CM | POA: Diagnosis not present

## 2024-06-22 DIAGNOSIS — J301 Allergic rhinitis due to pollen: Secondary | ICD-10-CM | POA: Diagnosis not present

## 2024-06-22 DIAGNOSIS — I509 Heart failure, unspecified: Secondary | ICD-10-CM | POA: Diagnosis not present

## 2024-06-22 DIAGNOSIS — R911 Solitary pulmonary nodule: Secondary | ICD-10-CM | POA: Diagnosis not present

## 2024-06-22 DIAGNOSIS — J449 Chronic obstructive pulmonary disease, unspecified: Secondary | ICD-10-CM | POA: Diagnosis not present

## 2024-06-25 DIAGNOSIS — I509 Heart failure, unspecified: Secondary | ICD-10-CM | POA: Diagnosis not present

## 2024-06-25 DIAGNOSIS — J449 Chronic obstructive pulmonary disease, unspecified: Secondary | ICD-10-CM | POA: Diagnosis not present

## 2024-06-25 DIAGNOSIS — E1169 Type 2 diabetes mellitus with other specified complication: Secondary | ICD-10-CM | POA: Diagnosis not present

## 2024-06-25 DIAGNOSIS — E785 Hyperlipidemia, unspecified: Secondary | ICD-10-CM | POA: Diagnosis not present

## 2024-07-08 DIAGNOSIS — G4733 Obstructive sleep apnea (adult) (pediatric): Secondary | ICD-10-CM | POA: Diagnosis not present

## 2024-07-14 ENCOUNTER — Encounter: Payer: Self-pay | Admitting: Cardiology

## 2024-07-14 ENCOUNTER — Ambulatory Visit: Attending: Cardiology | Admitting: Cardiology

## 2024-07-14 VITALS — BP 120/70 | HR 69 | Ht 71.6 in | Wt 178.8 lb

## 2024-07-14 DIAGNOSIS — I2510 Atherosclerotic heart disease of native coronary artery without angina pectoris: Secondary | ICD-10-CM | POA: Diagnosis not present

## 2024-07-14 DIAGNOSIS — E782 Mixed hyperlipidemia: Secondary | ICD-10-CM

## 2024-07-14 DIAGNOSIS — I10 Essential (primary) hypertension: Secondary | ICD-10-CM

## 2024-07-14 DIAGNOSIS — I429 Cardiomyopathy, unspecified: Secondary | ICD-10-CM | POA: Diagnosis not present

## 2024-07-14 MED ORDER — FISH OIL 1000 MG PO CAPS: 2.0000 | ORAL_CAPSULE | Freq: Two times a day (BID) | ORAL | Status: AC

## 2024-07-14 NOTE — Patient Instructions (Signed)
 Medication Instructions:  Your physician has recommended you make the following change in your medication:   Start 2,000 mg fish oil  twice daily.  *If you need a refill on your cardiac medications before your next appointment, please call your pharmacy*   Lab Work: Your physician recommends that you return for lab work in: 3 months for fasting lipids and LFT's You need to have labs done when you are fasting.  You can come Monday through Friday 8:30 am to 12:00 pm and 1:15 to 4:30. You do not need to make an appointment as the order has already been placed.   If you have labs (blood work) drawn today and your tests are completely normal, you will receive your results only by: MyChart Message (if you have MyChart) OR A paper copy in the mail If you have any lab test that is abnormal or we need to change your treatment, we will call you to review the results.  Testing/Procedures: Your physician has requested that you have an echocardiogram in 3 months. Echocardiography is a painless test that uses sound waves to create images of your heart. It provides your doctor with information about the size and shape of your heart and how well your heart's chambers and valves are working. This procedure takes approximately one hour. There are no restrictions for this procedure. Please do NOT wear cologne, perfume, aftershave, or lotions (deodorant is allowed). Please arrive 15 minutes prior to your appointment time.  Please note: We ask at that you not bring children with you during ultrasound (echo/ vascular) testing. Due to room size and safety concerns, children are not allowed in the ultrasound rooms during exams. Our front office staff cannot provide observation of children in our lobby area while testing is being conducted. An adult accompanying a patient to their appointment will only be allowed in the ultrasound room at the discretion of the ultrasound technician under special circumstances. We  apologize for any inconvenience.  Follow-Up: At [facility], you and your health needs are our priority.  As part of our continuing mission to provide you with exceptional heart care, we have created designated Provider Care Teams.  These Care Teams include your primary Cardiologist (physician) and Advanced Practice Providers (APPs -  Physician Assistants and Nurse Practitioners) who all work together to provide you with the care you need, when you need it.  We recommend signing up for the patient portal called MyChart.  Sign up information is provided on this After Visit Summary.  MyChart is used to connect with patients for Virtual Visits (Telemedicine).  Patients are able to view lab/test results, encounter notes, upcoming appointments, etc.  Non-urgent messages can be sent to your provider as well.   To learn more about what you can do with MyChart, go to [email or web address].    Your next appointment:   9 month(s)  The format for your next appointment:   In Person  Provider:   Jennifer Crape, MD   Other Instructions Echocardiogram An echocardiogram is a test that uses sound waves (ultrasound) to produce images of the heart. Images from an echocardiogram can provide important information about: Heart size and shape. The size and thickness and movement of your heart's walls. Heart muscle function and strength. Heart valve function or if you have stenosis. Stenosis is when the heart valves are too narrow. If blood is flowing backward through the heart valves (regurgitation). A tumor or infectious growth around the heart valves. Areas of heart muscle that are not  working well because of poor blood flow or injury from a heart attack. Aneurysm detection. An aneurysm is a weak or damaged part of an artery wall. The wall bulges out from the normal force of blood pumping through the body. Tell a health care provider about: Any allergies you have. All medicines you are taking,  including vitamins, herbs, eye drops, creams, and over-the-counter medicines. Any blood disorders you have. Any surgeries you have had. Any medical conditions you have. Whether you are pregnant or may be pregnant. What are the risks? Generally, this is a safe test. However, problems may occur, including an allergic reaction to dye (contrast) that may be used during the test. What happens before the test? No specific preparation is needed. You may eat and drink normally. What happens during the test? You will take off your clothes from the waist up and put on a hospital gown. Electrodes or electrocardiogram (ECG)patches may be placed on your chest. The electrodes or patches are then connected to a device that monitors your heart rate and rhythm. You will lie down on a table for an ultrasound exam. A gel will be applied to your chest to help sound waves pass through your skin. A handheld device, called a transducer, will be pressed against your chest and moved over your heart. The transducer produces sound waves that travel to your heart and bounce back (or echo back) to the transducer. These sound waves will be captured in real-time and changed into images of your heart that can be viewed on a video monitor. The images will be recorded on a computer and reviewed by your health care provider. You may be asked to change positions or hold your breath for a short time. This makes it easier to get different views or better views of your heart. In some cases, you may receive contrast through an IV in one of your veins. This can improve the quality of the pictures from your heart. The procedure may vary among health care providers and hospitals.   What can I expect after the test? You may return to your normal, everyday life, including diet, activities, and medicines, unless your health care provider tells you not to do that. Follow these instructions at home: It is up to you to get the results of your  test. Ask your health care provider, or the department that is doing the test, when your results will be ready. Keep all follow-up visits. This is important. Summary An echocardiogram is a test that uses sound waves (ultrasound) to produce images of the heart. Images from an echocardiogram can provide important information about the size and shape of your heart, heart muscle function, heart valve function, and other possible heart problems. You do not need to do anything to prepare before this test. You may eat and drink normally. After the echocardiogram is completed, you may return to your normal, everyday life, unless your health care provider tells you not to do that. This information is not intended to replace advice given to you by your health care provider. Make sure you discuss any questions you have with your health care provider. Document Revised: 07/05/2020 Document Reviewed: 07/05/2020 Elsevier Patient Education  2021 [employer]   Important Information About Sugar

## 2024-07-14 NOTE — Progress Notes (Signed)
 Cardiology Office Note:    Date:  07/14/2024   ID:  [name], DOB [birth year], MRN [id number]  PCP:  Venancio Pock, PA-C  Cardiologist:  Jennifer JONELLE Crape, MD   Referring MD: Venancio Pock, PA-C    ASSESSMENT:    1. Mixed hyperlipidemia   2. Coronary artery disease involving native coronary artery of native heart without angina pectoris   3. Cardiomyopathy, unspecified type (HCC)   4. Essential hypertension    PLAN:    In order of problems listed above:  Coronary artery disease: Secondary prevention stressed with the patient.  Importance of compliance with diet medication stressed and patient verbalized standing.  He was advised to ambulate to the best of his ability. Essential hypertension: Blood pressure stable and diet was emphasized. Cardiomyopathy: Stable.  Will get an echo to assess this.  Since he was off Entresto  I would like to repeat this in 3 months. Mixed dyslipidemia: Markedly abnormal lipids.  Diet emphasized.  Compliance with medication urged will get blood work in 3 months.  Also added fish oil  2 g twice daily to his regimen.  Lifestyle modification urged. Patient will be seen in follow-up appointment in 6 months or earlier if the patient has any concerns.    Medication Adjustments/Labs and Tests Ordered: Current medicines are reviewed at length with the patient today.  Concerns regarding medicines are outlined above.  Orders Placed This Encounter  Procedures   EKG 12-Lead   No orders of the defined types were placed in this encounter.    No chief complaint on file.    History of Present Illness:    [name] is a 78 y.o. male.  Patient has past medical history of cardiomyopathy, essential hypertension, mixed dyslipidemia.  He denies any problems at this time and takes care of activities of daily living.  No chest pain orthopnea or PND.  At the time of my evaluation, the patient is alert awake oriented and in no distress.  He mentions to me that  he did not get the Entresto  over a month and was off the medicine.  This was because of some issue with the pharmacy.  Past Medical History:  Diagnosis Date   Abnormal loss of weight 08/31/2019   Abnormality of gait 03/31/2013   Acute blood loss anemia 04/11/2023   ALLERGIC RHINITIS 11/06/2007   Qualifier: Diagnosis of   By: Norleen MD, [place] ORN     Replacing diagnoses that were inactivated after the 02/25/23 regulatory import   Anemia, unspecified 08/22/2012   BENIGN PROSTATIC HYPERTROPHY 07/14/2007   CAD (coronary artery disease) 08/01/2022   Cardiomyopathy (HCC) 06/11/2022   Cellulitis of left hand 04/16/2023   Cerebrovascular disease 04/11/2023   Cervical spondylosis with radiculopathy 06/11/2022   Chronic osteomyelitis of toe of left foot (HCC) 09/12/2017   Chronic right shoulder pain 06/29/2022   Complication of anesthesia    pt states he needs to be cathed after surgery   Controlled type 2 diabetes mellitus without complication, without long-term current use of insulin  (HCC) 08/22/2012   COPD (chronic obstructive pulmonary disease) (HCC) 04/11/2023   COVID-19    Degenerative arthritis of right knee 12/12/2011   Diabetes (HCC) 02/03/2013   Diet controlled   Diabetes mellitus due to underlying condition with unspecified complications (HCC) 06/11/2022   Disequilibrium 03/31/2013   Dyspnea 08/31/2019   Encounter for well adult exam with abnormal findings 12/10/2011   Essential hypertension 07/11/2007   Qualifier: Diagnosis of  By: Helga Almarie Caldron  Former smoker 02/08/2015   GERD 07/11/2007   GERD with esophagitis 04/10/2023   Heart failure with reduced ejection fraction (HCC) 04/10/2023   Hematemesis due to hiatal hernia/esophagitis 04/10/2023   Hiatal hernia 04/11/2023   History of colonic polyps 07/14/2007   Qualifier: Diagnosis of   By: Norleen MD, [place] ORN     IMO SNOMED Dx Update Oct 2024     Hypercalcemia 08/12/2015   Hyperlipidemia    Hyperthyroidism     Hypervitaminosis D 01/14/2019   Hypokalemia 04/10/2023   Hypothyroidism    Hypoxia    Lacunar infarction (HCC) 03/31/2013   Microalbuminuria 08/31/2019   Neck pain 03/26/2022   Neural foraminal stenosis of cervical spine 06/11/2022   Odynophagia 04/10/2023   OSTEOARTHRITIS, KNEE, RIGHT 10/27/2008   PERIPHERAL EDEMA 05/31/2009   Polyarthralgia 09/12/2017   Primary osteoarthritis of right shoulder 06/29/2022   PULMONARY NODULE 06/24/2008   granuloma   Recurrent falls 08/17/2016   Right shoulder pain 08/17/2016   Rotator cuff arthropathy 09/07/2016   Injected 09/07/2016 Repeat injection 01/07/17 Repeat injection April 20th 2018. Repeat injection 05/22/2017 Repeat injection 08/15/2017   Skin lesion 02/19/2017   Spinal stenosis in cervical region 03/31/2013   Spondylosis, cervical, with myelopathy 11/11/2012   TIA (transient ischemic attack) 08/13/2015   Upper back pain on right side 08/31/2019   Vitamin D  deficiency 08/12/2015    Past Surgical History:  Procedure Laterality Date   ANTERIOR CERVICAL DECOMP/DISCECTOMY FUSION N/A 01/06/2013   Procedure: ANTERIOR CERVICAL DECOMPRESSION/DISCECTOMY FUSION 1 LEVEL;  Surgeon: Fairy Levels, MD;  Location: [facility];  Service: Neurosurgery;  Laterality: N/A;  Cervical three-four Anterior cervical decompression/diskectomy/fusion   BACK SURGERY  1986   x 2  1986 C 3   ESOPHAGOGASTRODUODENOSCOPY (EGD) WITH PROPOFOL  N/A 04/11/2023   Procedure: ESOPHAGOGASTRODUODENOSCOPY (EGD) WITH PROPOFOL ;  Surgeon: Legrand Victory LITTIE DOUGLAS, MD;  Location: [facility] ENDOSCOPY;  Service: Gastroenterology;  Laterality: N/A;   HERNIA REPAIR  sept 1986   left knee replacement  Nov 21, 2007   NECK SURGERY     NISSEN FUNDOPLICATION  2000   right heel bone spur  1987   TOTAL KNEE ARTHROPLASTY  01/09/2012   Procedure: TOTAL KNEE ARTHROPLASTY;  Surgeon: Tanda DELENA Heading, MD;  Location: [facility];  Service: Orthopedics;  Laterality: Right;    Current Medications: Current Meds   Medication Sig   acetaminophen  (TYLENOL ) 325 MG tablet Take 2 tablets (650 mg total) by mouth every 6 (six) hours as needed for moderate pain, fever or headache.   albuterol  (VENTOLIN  HFA) 108 (90 Base) MCG/ACT inhaler Inhale 1-2 puffs into the lungs every 6 (six) hours as needed for wheezing or shortness of breath.   atorvastatin  (LIPITOR ) 80 MG tablet Take 1 tablet (80 mg total) by mouth daily.   dapagliflozin  propanediol (FARXIGA ) 10 MG TABS tablet Take 1 tablet (10 mg total) by mouth daily before breakfast.   FARXIGA  10 MG TABS tablet Take 1 tablet (10 mg total) by mouth daily.   furosemide  (LASIX ) 40 MG tablet Take 1 tablet (40 mg total) by mouth daily.   gabapentin  (NEURONTIN ) 400 MG capsule TAKE 1 CAPSULE BY MOUTH 3  TIMES DAILY   labetalol  (NORMODYNE ) 300 MG tablet TAKE 1 TABLET BY MOUTH  TWICE DAILY   levothyroxine  (SYNTHROID ) 100 MCG tablet Take 100 mcg by mouth daily before breakfast.   montelukast  (SINGULAIR ) 10 MG tablet Take 10 mg by mouth daily.   MOUNJARO 5 MG/0.5ML Pen Inject 0.5 mLs into the skin once a  week.   MYRBETRIQ  25 MG TB24 tablet Take 25 mg by mouth daily.   pantoprazole  (PROTONIX ) 40 MG tablet Take 1 tablet (40 mg total) by mouth 2 (two) times daily before a meal.   rOPINIRole (REQUIP) 1 MG tablet Take 1 mg by mouth at bedtime.   sacubitril -valsartan  (ENTRESTO ) 97-103 MG Take 1 tablet by mouth 2 (two) times daily.   spironolactone  (ALDACTONE ) 25 MG tablet Take 0.5 tablets (12.5 mg total) by mouth daily.   tamsulosin (FLOMAX) 0.4 MG CAPS capsule Take 0.4 mg by mouth at bedtime.   traZODone (DESYREL) 50 MG tablet Take 50 mg by mouth at bedtime.   TRELEGY ELLIPTA 100-62.5-25 MCG/ACT AEPB Take 1 puff by mouth daily.   zaleplon (SONATA) 10 MG capsule Take 10 mg by mouth at bedtime.     Allergies:   Latex, Codeine, Zolpidem tartrate, Hct [hydrochlorothiazide ], Metformin  and related, Naproxen, Prednisone , Adhesive [tape], and Penicillins   Social History    Socioeconomic History   Marital status: Married    Spouse name: Elveria   Number of children: 2   Years of education: HS   Highest education level: Not on file  Occupational History   Occupation: Retired  Tobacco Use   Smoking status: Former    Current packs/day: 0.00    Average packs/day: 3.0 packs/day for 30.0 years (90.0 ttl pk-yrs)    Types: Cigarettes    Start date: 11/26/1952    Quit date: 11/26/1982    Years since quitting: 41.6   Smokeless tobacco: Never  Substance and Sexual Activity   Alcohol use: No   Drug use: No   Sexual activity: Not on file  Other Topics Concern   Not on file  Social History Narrative   Patient lives at home with spouse.   Caffiene Use: 4 cups daily   Social Drivers of Health   Financial Resource Strain: Low Risk  (01/18/2023)   Received from [facility]   Overall Financial Resource Strain (CARDIA)    Difficulty of Paying Living Expenses: Not hard at all  Food Insecurity: No Food Insecurity (04/16/2023)   Hunger Vital Sign    Worried About Running Out of Food in the Last Year: Never true    Ran Out of Food in the Last Year: Never true  Transportation Needs: No Transportation Needs (04/16/2023)   PRAPARE - Administrator, Civil Service (Medical): No    Lack of Transportation (Non-Medical): No  Physical Activity: Not on file  Stress: Not on file  Social Connections: Unknown (03/26/2022)   Received from [facility]   Social Network    Social Network: Not on file     Family History: The patient's family history includes Emphysema in his father; Heart attack in his father and mother; Heart disease in an other family member. There is no history of Diabetes, Thyroid  disease, or Hypercalcemia.  ROS:   Please see the history of present illness.    All other systems reviewed and are negative.  EKGs/Labs/Other Studies Reviewed:    The following studies were reviewed today: .SABRAEKG Interpretation Date/Time:  Tuesday July 14 2024 15:19:28 EDT Ventricular Rate:  69 PR Interval:  188 QRS Duration:  158 QT Interval:  480 QTC Calculation: 514 R Axis:   2  Text Interpretation: Normal sinus rhythm Left bundle branch block Abnormal ECG When compared with ECG of 15-Apr-2023 15:59, No significant change was found Confirmed by Edwyna Backers [phone]) on 07/14/2024 3:44:27 PM     Recent  Labs: No results found for requested labs within last 365 days.  Recent Lipid Panel    Component Value Date/Time   CHOL 147 07/23/2022 0825   TRIG 113 07/23/2022 0825   HDL 40 07/23/2022 0825   CHOLHDL 3.7 07/23/2022 0825   CHOLHDL 3 02/22/2020 1301   VLDL 34.6 02/22/2020 1301   LDLCALC 86 07/23/2022 0825   LDLDIRECT 84.0 08/25/2018 0848    Physical Exam:    VS:  BP 120/70   Pulse 69   Ht 5' 11.6 (1.819 m)   Wt 178 lb 12.8 oz (81.1 kg)   SpO2 97%   BMI 24.52 kg/m     Wt Readings from Last 3 Encounters:  07/14/24 178 lb 12.8 oz (81.1 kg)  01/14/24 183 lb 9.6 oz (83.3 kg)  07/10/23 178 lb (80.7 kg)     GEN: Patient is in no acute distress HEENT: Normal NECK: No JVD; No carotid bruits LYMPHATICS: No lymphadenopathy CARDIAC: Hear sounds regular, 2/6 systolic murmur at the apex. RESPIRATORY:  Clear to auscultation without rales, wheezing or rhonchi  ABDOMEN: Soft, non-tender, non-distended MUSCULOSKELETAL:  No edema; No deformity  SKIN: Warm and dry NEUROLOGIC:  Alert and oriented x 3 PSYCHIATRIC:  Normal affect   Signed, Jennifer JONELLE Crape, MD  07/14/2024 3:55 PM    [facility]

## 2024-07-20 ENCOUNTER — Other Ambulatory Visit: Payer: Self-pay | Admitting: Cardiology

## 2024-07-21 NOTE — Telephone Encounter (Signed)
 Rx refill sent to pharmacy.

## 2024-07-26 DIAGNOSIS — E1169 Type 2 diabetes mellitus with other specified complication: Secondary | ICD-10-CM | POA: Diagnosis not present

## 2024-07-26 DIAGNOSIS — E785 Hyperlipidemia, unspecified: Secondary | ICD-10-CM | POA: Diagnosis not present

## 2024-07-31 DIAGNOSIS — G4733 Obstructive sleep apnea (adult) (pediatric): Secondary | ICD-10-CM | POA: Diagnosis not present

## 2024-08-05 DIAGNOSIS — Z79899 Other long term (current) drug therapy: Secondary | ICD-10-CM | POA: Diagnosis not present

## 2024-08-05 DIAGNOSIS — I69354 Hemiplegia and hemiparesis following cerebral infarction affecting left non-dominant side: Secondary | ICD-10-CM | POA: Diagnosis not present

## 2024-08-05 DIAGNOSIS — J449 Chronic obstructive pulmonary disease, unspecified: Secondary | ICD-10-CM | POA: Diagnosis not present

## 2024-08-05 DIAGNOSIS — E1169 Type 2 diabetes mellitus with other specified complication: Secondary | ICD-10-CM | POA: Diagnosis not present

## 2024-08-05 DIAGNOSIS — I700 Atherosclerosis of aorta: Secondary | ICD-10-CM | POA: Diagnosis not present

## 2024-08-05 DIAGNOSIS — E785 Hyperlipidemia, unspecified: Secondary | ICD-10-CM | POA: Diagnosis not present

## 2024-08-05 DIAGNOSIS — E039 Hypothyroidism, unspecified: Secondary | ICD-10-CM | POA: Diagnosis not present

## 2024-08-05 DIAGNOSIS — Z6824 Body mass index (BMI) 24.0-24.9, adult: Secondary | ICD-10-CM | POA: Diagnosis not present

## 2024-08-05 DIAGNOSIS — I509 Heart failure, unspecified: Secondary | ICD-10-CM | POA: Diagnosis not present

## 2024-08-05 DIAGNOSIS — E538 Deficiency of other specified B group vitamins: Secondary | ICD-10-CM | POA: Diagnosis not present

## 2024-08-08 DIAGNOSIS — G4733 Obstructive sleep apnea (adult) (pediatric): Secondary | ICD-10-CM | POA: Diagnosis not present

## 2024-08-27 ENCOUNTER — Other Ambulatory Visit: Payer: Self-pay

## 2024-08-27 DIAGNOSIS — I429 Cardiomyopathy, unspecified: Secondary | ICD-10-CM

## 2024-08-27 DIAGNOSIS — I5020 Unspecified systolic (congestive) heart failure: Secondary | ICD-10-CM

## 2024-08-27 DIAGNOSIS — E119 Type 2 diabetes mellitus without complications: Secondary | ICD-10-CM

## 2024-08-27 MED ORDER — FARXIGA 10 MG PO TABS: 10.0000 mg | ORAL_TABLET | Freq: Every day | ORAL | 2 refills | Status: AC

## 2024-09-10 DIAGNOSIS — Z23 Encounter for immunization: Secondary | ICD-10-CM | POA: Diagnosis not present

## 2024-09-10 DIAGNOSIS — E538 Deficiency of other specified B group vitamins: Secondary | ICD-10-CM | POA: Diagnosis not present

## 2024-09-16 DIAGNOSIS — R911 Solitary pulmonary nodule: Secondary | ICD-10-CM | POA: Diagnosis not present

## 2024-09-16 DIAGNOSIS — J449 Chronic obstructive pulmonary disease, unspecified: Secondary | ICD-10-CM | POA: Diagnosis not present

## 2024-09-16 DIAGNOSIS — G4733 Obstructive sleep apnea (adult) (pediatric): Secondary | ICD-10-CM | POA: Diagnosis not present

## 2024-09-16 DIAGNOSIS — I509 Heart failure, unspecified: Secondary | ICD-10-CM | POA: Diagnosis not present

## 2024-09-22 DIAGNOSIS — R911 Solitary pulmonary nodule: Secondary | ICD-10-CM | POA: Diagnosis not present

## 2024-09-22 DIAGNOSIS — J449 Chronic obstructive pulmonary disease, unspecified: Secondary | ICD-10-CM | POA: Diagnosis not present

## 2024-09-22 DIAGNOSIS — J301 Allergic rhinitis due to pollen: Secondary | ICD-10-CM | POA: Diagnosis not present

## 2024-09-22 DIAGNOSIS — G2581 Restless legs syndrome: Secondary | ICD-10-CM | POA: Diagnosis not present

## 2024-09-25 DIAGNOSIS — I10 Essential (primary) hypertension: Secondary | ICD-10-CM | POA: Diagnosis not present

## 2024-09-25 DIAGNOSIS — I509 Heart failure, unspecified: Secondary | ICD-10-CM | POA: Diagnosis not present

## 2024-09-25 DIAGNOSIS — E1169 Type 2 diabetes mellitus with other specified complication: Secondary | ICD-10-CM | POA: Diagnosis not present

## 2024-09-25 DIAGNOSIS — J449 Chronic obstructive pulmonary disease, unspecified: Secondary | ICD-10-CM | POA: Diagnosis not present

## 2024-10-14 ENCOUNTER — Ambulatory Visit: Attending: Cardiology

## 2024-10-14 ENCOUNTER — Telehealth: Payer: Self-pay | Admitting: Cardiology

## 2024-10-14 ENCOUNTER — Other Ambulatory Visit: Payer: Self-pay | Admitting: Emergency Medicine

## 2024-10-14 DIAGNOSIS — I429 Cardiomyopathy, unspecified: Secondary | ICD-10-CM

## 2024-10-14 LAB — ECHOCARDIOGRAM COMPLETE
AR max vel: 1.52 cm2
AV Area VTI: 1.46 cm2
AV Area mean vel: 1.48 cm2
AV Mean grad: 6 mmHg
AV Peak grad: 11.3 mmHg
Ao pk vel: 1.68 m/s
Area-P 1/2: 2.78 cm2
Calc EF: 52 %
MV M vel: 2.6 m/s
MV Peak grad: 27 mmHg
S' Lateral: 3.7 cm
Single Plane A2C EF: 58.2 %
Single Plane A4C EF: 45.9 %

## 2024-10-14 MED ORDER — SACUBITRIL-VALSARTAN 97-103 MG PO TABS: 1.0000 | ORAL_TABLET | Freq: Two times a day (BID) | ORAL | 2 refills | Status: AC

## 2024-10-14 NOTE — Telephone Encounter (Signed)
 Pt received a letter from cover my meds saying that they would no longer be covering the entresto  through cover my meds and the pt is requesting at generic be sent in.

## 2024-10-14 NOTE — Telephone Encounter (Signed)
 Returned patient's call to confirm pharmacy. Generic prescription for entresto  was sent to [facility] in [place] per patient request.  Alan, RN

## 2024-10-16 ENCOUNTER — Ambulatory Visit: Payer: Self-pay | Admitting: Cardiology

## 2024-10-16 NOTE — Telephone Encounter (Signed)
-----   Message from Ouray Revankar sent at 10/16/2024 10:56 AM EST ----- Blood pressure is stable.  Continue current medications.  Copy primary Jennifer JONELLE Crape, MD 10/16/2024 10:56 AM  ----- Message ----- From: Interface, [employer] Sent: 10/14/2024  12:51 PM EST To: Jennifer JONELLE Crape, MD

## 2024-10-16 NOTE — Telephone Encounter (Signed)
Results reviewed with pt as per Dr. Revankar's note.  Pt verbalized understanding and had no additional questions. Routed to PCP.  

## 2024-10-20 DIAGNOSIS — J301 Allergic rhinitis due to pollen: Secondary | ICD-10-CM | POA: Diagnosis not present

## 2024-10-20 DIAGNOSIS — J449 Chronic obstructive pulmonary disease, unspecified: Secondary | ICD-10-CM | POA: Diagnosis not present

## 2024-10-20 DIAGNOSIS — R911 Solitary pulmonary nodule: Secondary | ICD-10-CM | POA: Diagnosis not present

## 2024-10-20 DIAGNOSIS — R0600 Dyspnea, unspecified: Secondary | ICD-10-CM | POA: Diagnosis not present

## 2024-10-27 ENCOUNTER — Other Ambulatory Visit: Payer: Self-pay | Admitting: Cardiology

## 2024-11-04 DIAGNOSIS — E1169 Type 2 diabetes mellitus with other specified complication: Secondary | ICD-10-CM | POA: Diagnosis not present

## 2024-11-04 DIAGNOSIS — E039 Hypothyroidism, unspecified: Secondary | ICD-10-CM | POA: Diagnosis not present

## 2024-11-04 DIAGNOSIS — E785 Hyperlipidemia, unspecified: Secondary | ICD-10-CM | POA: Diagnosis not present

## 2024-11-04 DIAGNOSIS — Z6824 Body mass index (BMI) 24.0-24.9, adult: Secondary | ICD-10-CM | POA: Diagnosis not present

## 2024-11-04 DIAGNOSIS — Z79899 Other long term (current) drug therapy: Secondary | ICD-10-CM | POA: Diagnosis not present

## 2024-11-04 DIAGNOSIS — J449 Chronic obstructive pulmonary disease, unspecified: Secondary | ICD-10-CM | POA: Diagnosis not present

## 2024-11-04 DIAGNOSIS — E538 Deficiency of other specified B group vitamins: Secondary | ICD-10-CM | POA: Diagnosis not present

## 2024-11-04 DIAGNOSIS — I2510 Atherosclerotic heart disease of native coronary artery without angina pectoris: Secondary | ICD-10-CM | POA: Diagnosis not present

## 2024-11-07 DIAGNOSIS — G4733 Obstructive sleep apnea (adult) (pediatric): Secondary | ICD-10-CM | POA: Diagnosis not present
